# Patient Record
Sex: Male | Born: 1955 | Race: White | Hispanic: No | Marital: Married | Smoking: Former smoker
Health system: Southern US, Community
[De-identification: ages and names within clinical notes are randomized; demographics above are authoritative.]

## PROBLEM LIST (undated history)

## (undated) DIAGNOSIS — K429 Umbilical hernia without obstruction or gangrene: Secondary | ICD-10-CM

## (undated) DIAGNOSIS — E785 Hyperlipidemia, unspecified: Secondary | ICD-10-CM

## (undated) DIAGNOSIS — M545 Low back pain, unspecified: Secondary | ICD-10-CM

## (undated) DIAGNOSIS — R200 Anesthesia of skin: Secondary | ICD-10-CM

## (undated) DIAGNOSIS — I340 Nonrheumatic mitral (valve) insufficiency: Secondary | ICD-10-CM

## (undated) DIAGNOSIS — I219 Acute myocardial infarction, unspecified: Secondary | ICD-10-CM

## (undated) DIAGNOSIS — I10 Essential (primary) hypertension: Secondary | ICD-10-CM

## (undated) DIAGNOSIS — I509 Heart failure, unspecified: Secondary | ICD-10-CM

## (undated) DIAGNOSIS — F419 Anxiety disorder, unspecified: Secondary | ICD-10-CM

## (undated) DIAGNOSIS — R202 Paresthesia of skin: Secondary | ICD-10-CM

## (undated) DIAGNOSIS — I5032 Chronic diastolic (congestive) heart failure: Secondary | ICD-10-CM

## (undated) DIAGNOSIS — G8929 Other chronic pain: Secondary | ICD-10-CM

## (undated) DIAGNOSIS — F329 Major depressive disorder, single episode, unspecified: Secondary | ICD-10-CM

## (undated) DIAGNOSIS — I2510 Atherosclerotic heart disease of native coronary artery without angina pectoris: Secondary | ICD-10-CM

## (undated) DIAGNOSIS — F32A Depression, unspecified: Secondary | ICD-10-CM

## (undated) DIAGNOSIS — K219 Gastro-esophageal reflux disease without esophagitis: Secondary | ICD-10-CM

## (undated) DIAGNOSIS — M1990 Unspecified osteoarthritis, unspecified site: Secondary | ICD-10-CM

## (undated) DIAGNOSIS — E786 Lipoprotein deficiency: Secondary | ICD-10-CM

## (undated) HISTORY — DX: Atherosclerotic heart disease of native coronary artery without angina pectoris: I25.10

## (undated) HISTORY — DX: Gastro-esophageal reflux disease without esophagitis: K21.9

## (undated) HISTORY — DX: Hyperlipidemia, unspecified: E78.5

## (undated) HISTORY — DX: Nonrheumatic mitral (valve) insufficiency: I34.0

## (undated) HISTORY — DX: Chronic diastolic (congestive) heart failure: I50.32

## (undated) HISTORY — DX: Essential (primary) hypertension: I10

## (undated) HISTORY — DX: Lipoprotein deficiency: E78.6

---

## 2001-05-29 ENCOUNTER — Encounter: Admission: RE | Admit: 2001-05-29 | Discharge: 2001-05-29 | Payer: Self-pay | Admitting: Internal Medicine

## 2001-05-29 ENCOUNTER — Encounter: Payer: Self-pay | Admitting: Internal Medicine

## 2002-11-19 DIAGNOSIS — I219 Acute myocardial infarction, unspecified: Secondary | ICD-10-CM

## 2002-11-19 HISTORY — DX: Acute myocardial infarction, unspecified: I21.9

## 2002-11-19 HISTORY — PX: CORONARY ARTERY BYPASS GRAFT: SHX141

## 2003-08-01 ENCOUNTER — Inpatient Hospital Stay (HOSPITAL_COMMUNITY): Admission: EM | Admit: 2003-08-01 | Discharge: 2003-08-05 | Payer: Self-pay | Admitting: Emergency Medicine

## 2003-08-01 ENCOUNTER — Encounter: Payer: Self-pay | Admitting: Cardiothoracic Surgery

## 2003-08-01 ENCOUNTER — Encounter: Payer: Self-pay | Admitting: Emergency Medicine

## 2003-08-02 ENCOUNTER — Encounter: Payer: Self-pay | Admitting: Cardiothoracic Surgery

## 2003-08-03 ENCOUNTER — Encounter: Payer: Self-pay | Admitting: Cardiothoracic Surgery

## 2003-08-04 ENCOUNTER — Encounter: Payer: Self-pay | Admitting: Cardiothoracic Surgery

## 2003-08-26 ENCOUNTER — Encounter: Payer: Self-pay | Admitting: Cardiothoracic Surgery

## 2003-08-26 ENCOUNTER — Encounter: Admission: RE | Admit: 2003-08-26 | Discharge: 2003-08-26 | Payer: Self-pay | Admitting: Cardiothoracic Surgery

## 2003-09-17 ENCOUNTER — Inpatient Hospital Stay (HOSPITAL_COMMUNITY): Admission: EM | Admit: 2003-09-17 | Discharge: 2003-09-22 | Payer: Self-pay | Admitting: Emergency Medicine

## 2003-12-23 ENCOUNTER — Encounter: Admission: RE | Admit: 2003-12-23 | Discharge: 2003-12-23 | Payer: Self-pay | Admitting: Cardiothoracic Surgery

## 2003-12-23 IMAGING — CR DG CHEST 2V
2 series · 2 of 2 positions shown · non-contrast
Comparison: none

CLINICAL DATA: History of CABG, CAD, follow up exam. 
 CHEST, TWO VIEWS
 Previous chest x-ray was at[HOSPITAL]on [DATE].  Changes of COPD are noted.  Sternal wire sutures and mediastinal clips.  No vascular congestion nor acute lung process.  Normal heart size and mediastinal contours. 
 IMPRESSION
 No acute chest disease findings.  CABG.  COPD.

[view not recorded (1 of 2)]
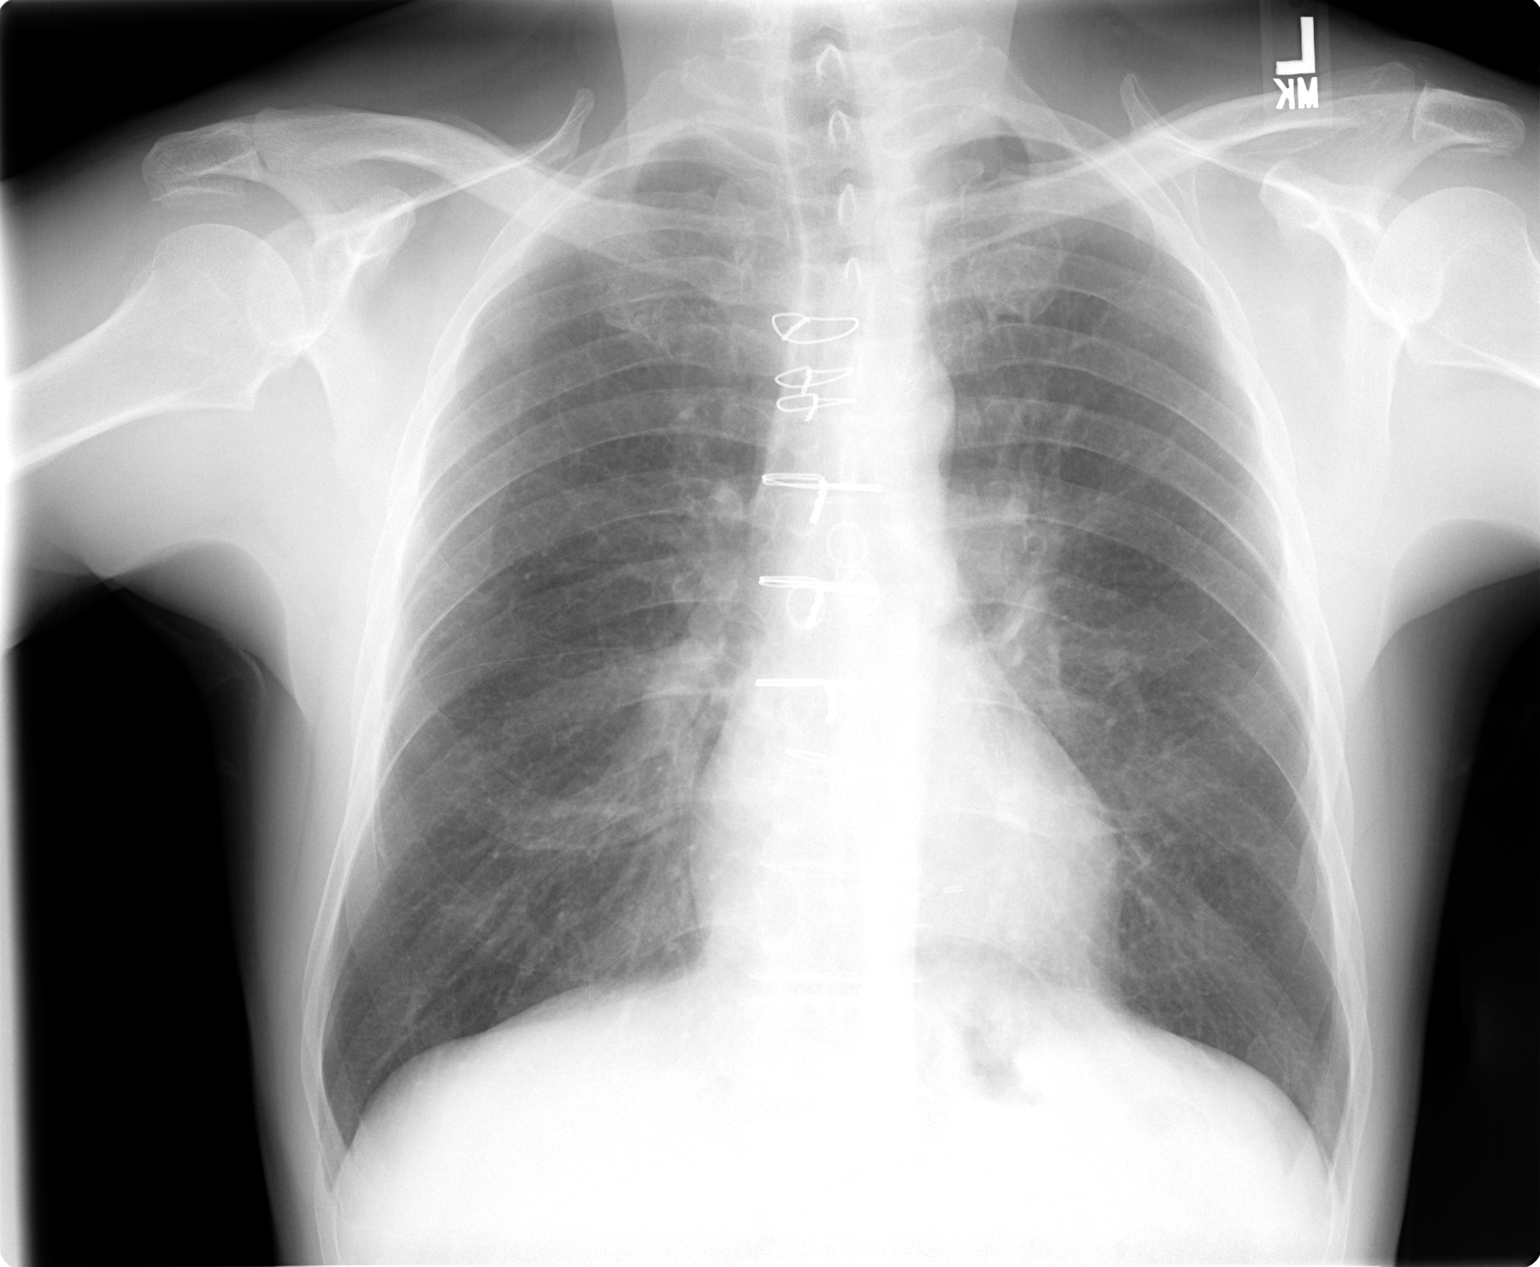

[view not recorded (2 of 2)]
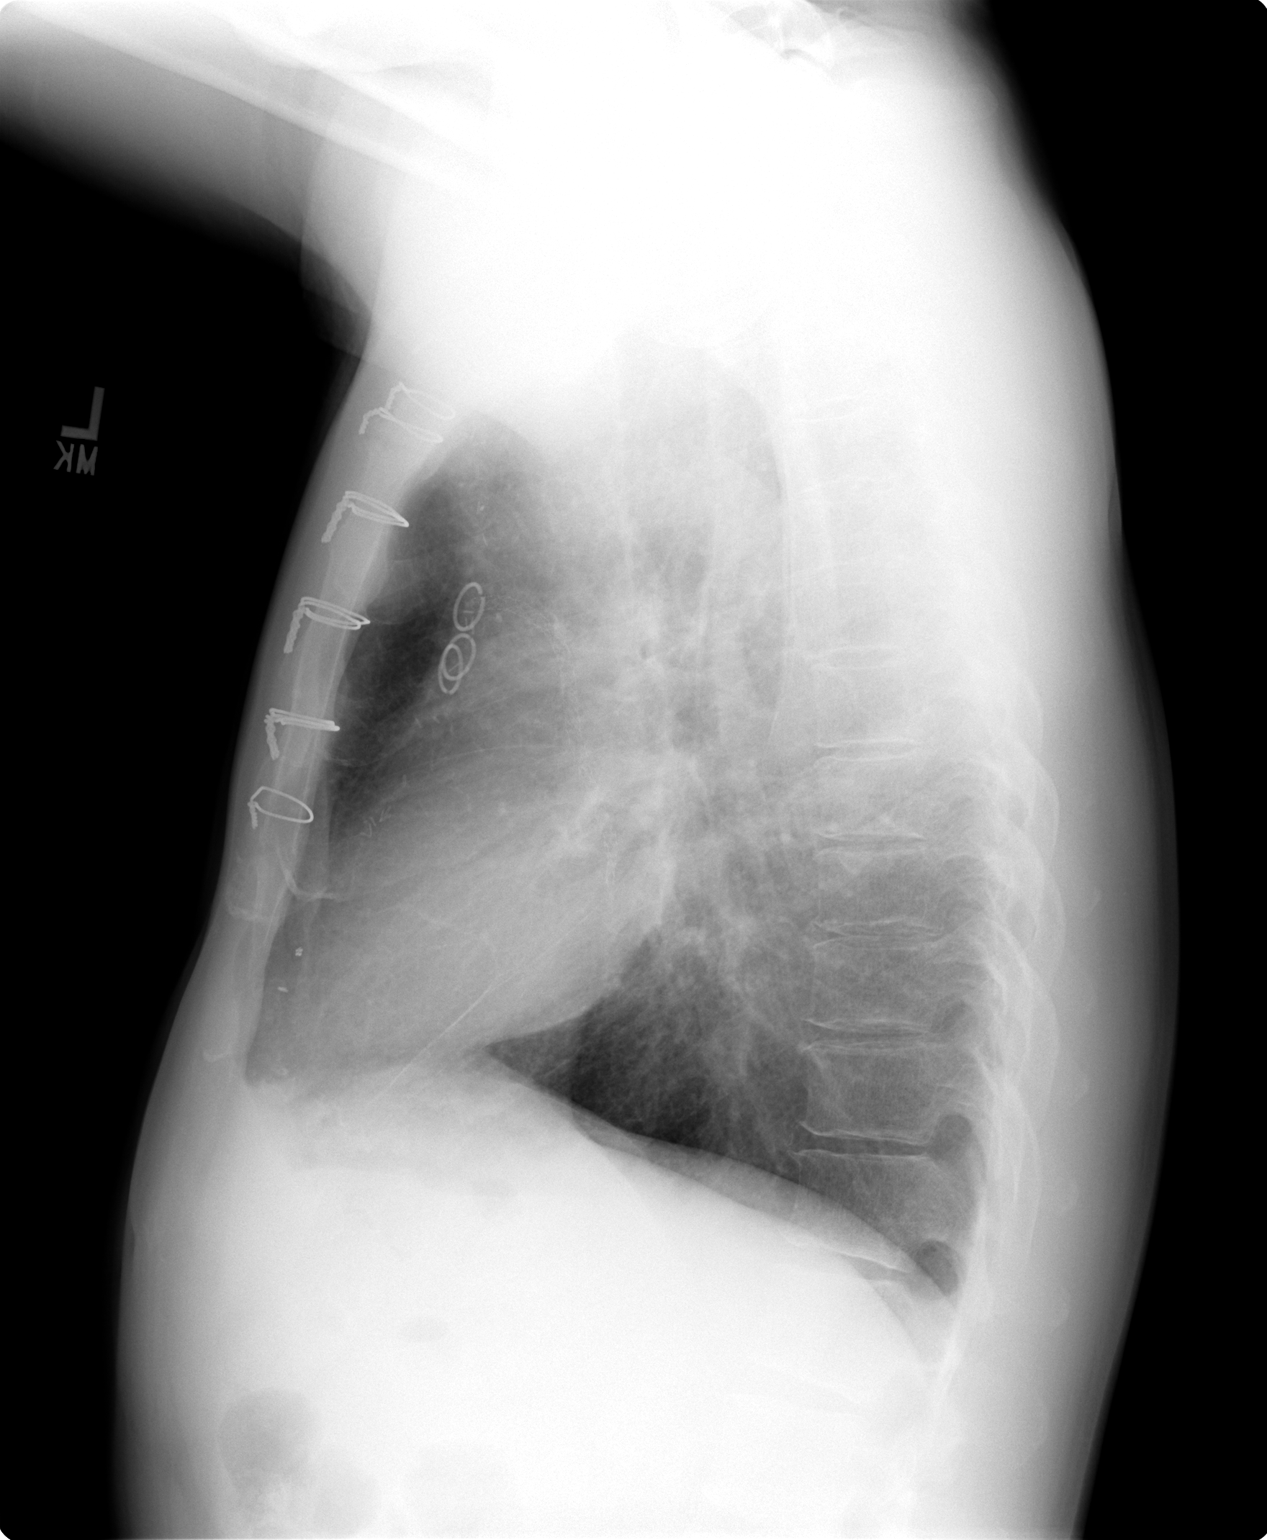

[2 of 2 positions shown; findings below may reference images not displayed]

## 2008-11-19 HISTORY — PX: INCISION AND DRAINAGE OF WOUND: SHX1803

## 2009-10-29 ENCOUNTER — Ambulatory Visit: Payer: Self-pay | Admitting: Internal Medicine

## 2009-10-29 ENCOUNTER — Inpatient Hospital Stay (HOSPITAL_COMMUNITY): Admission: EM | Admit: 2009-10-29 | Discharge: 2009-11-01 | Payer: Self-pay | Admitting: Emergency Medicine

## 2009-10-29 IMAGING — CR DG FEMUR 2V*L*
4 series · 4 of 4 positions shown · non-contrast
Comparison: None

CLINICAL DATA: Left leg swelling.  Possible infection.

LEFT FEMUR - 2 VIEW

[t femur with hip  ap left]
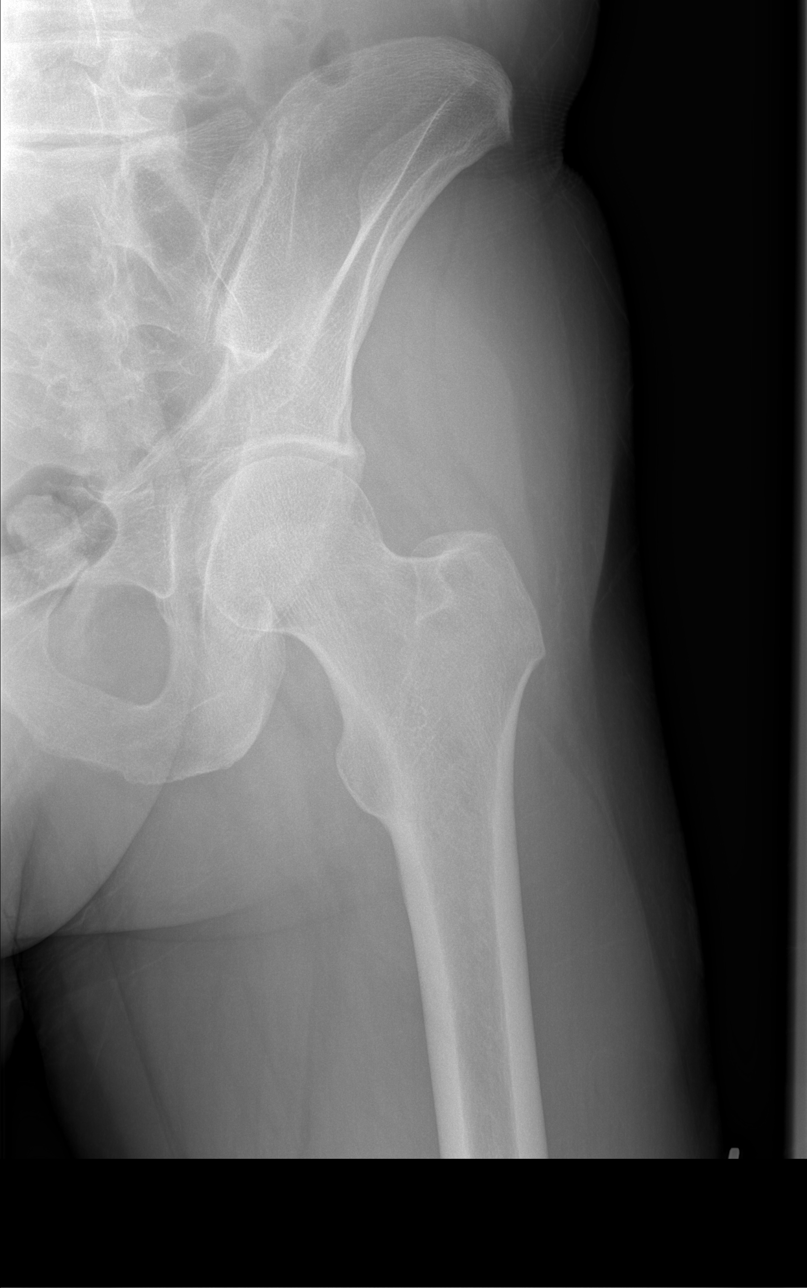

[t femur with knee ap left]
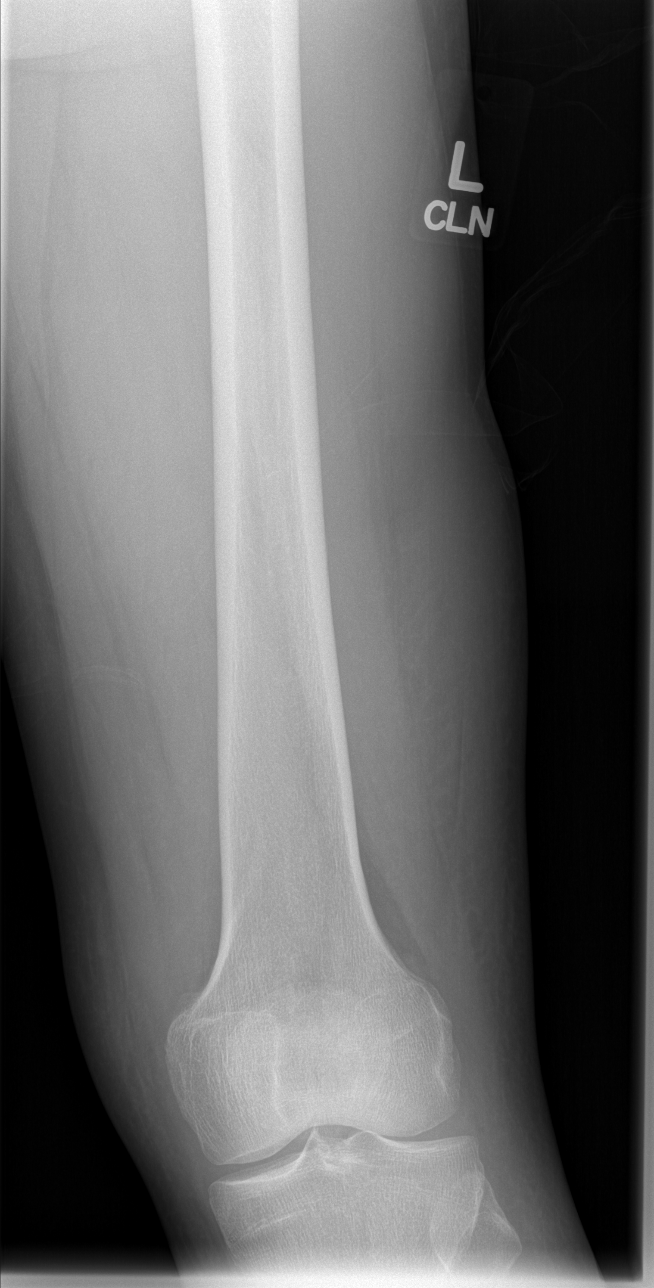

[t femur with hip lat left]
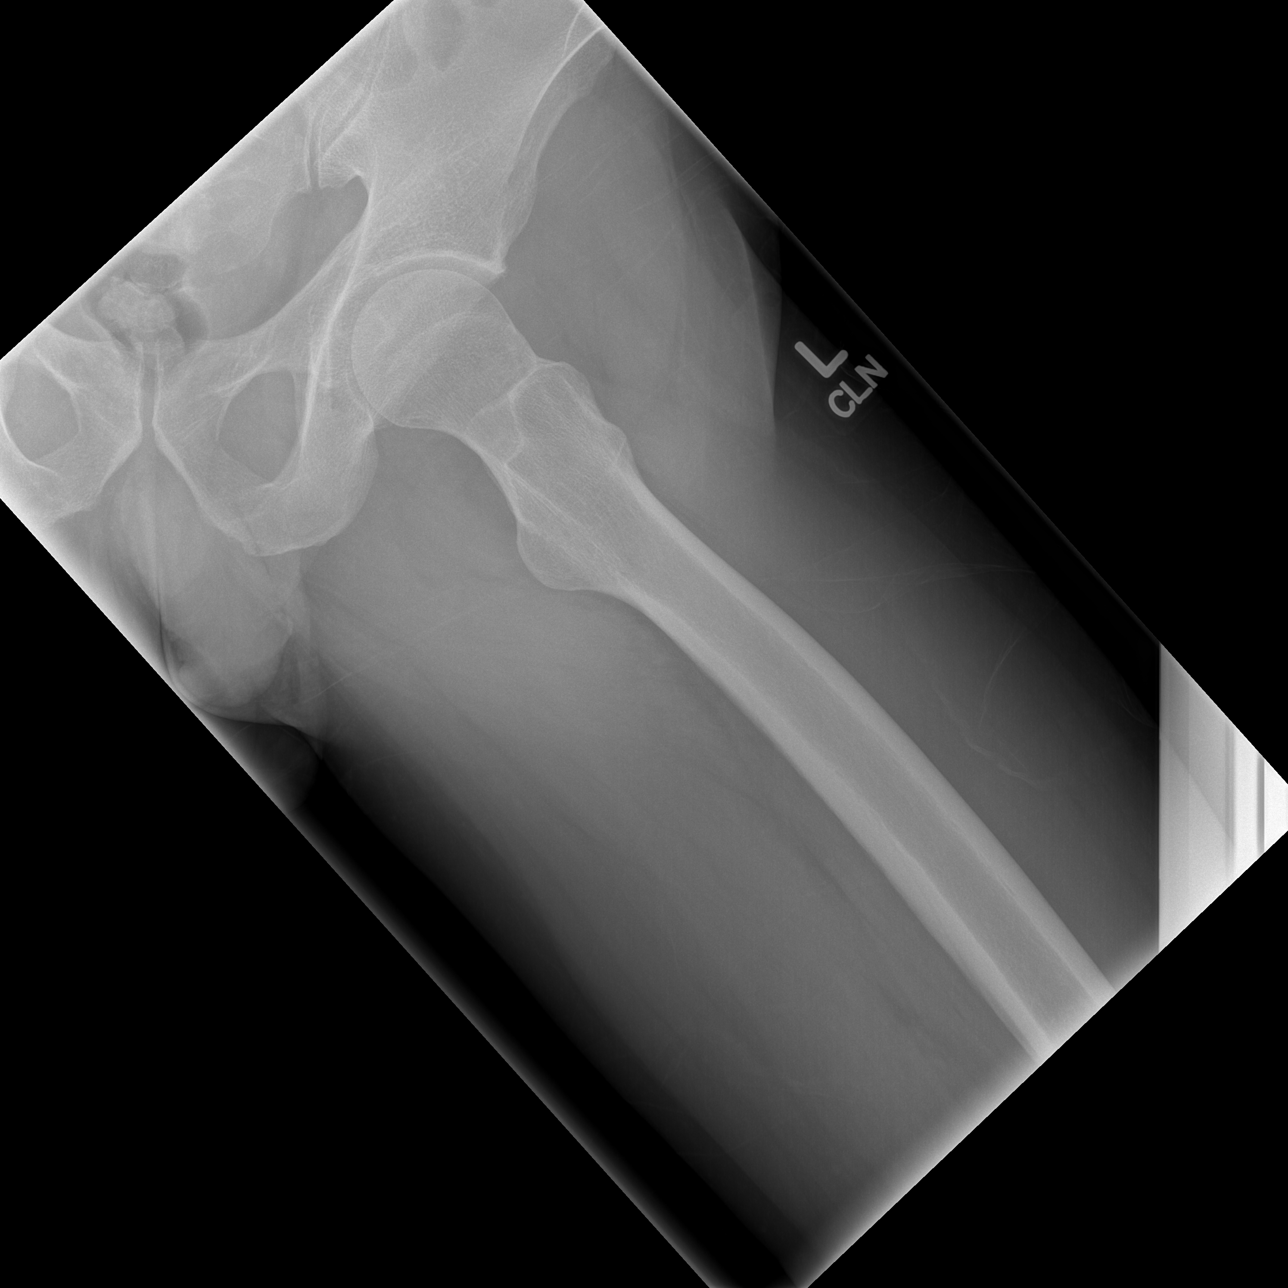

[t femur with knee lat left]
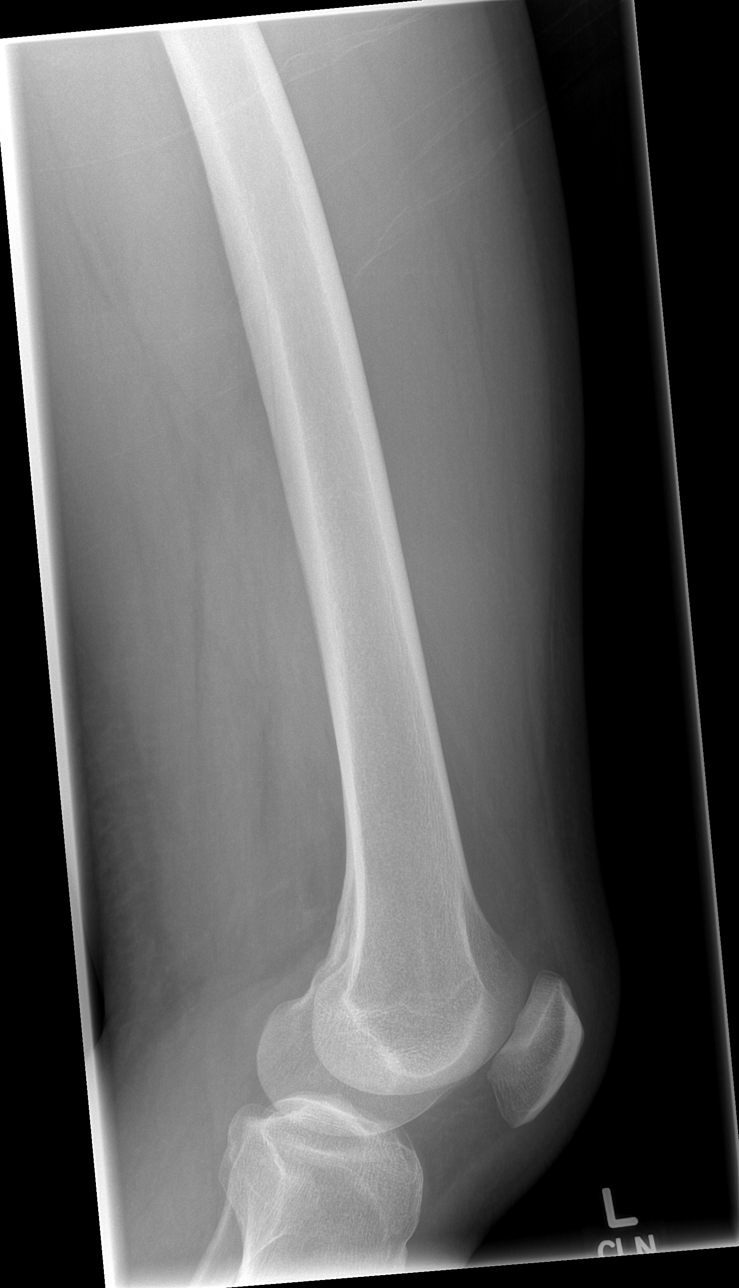

[4 of 4 positions shown; findings below may reference images not displayed]

FINDINGS: There appears to be some soft tissue swelling distally in
the lower thigh.  No foreign body, soft tissue emphysema or bone
destruction is identified.  There is no evidence of acute fracture
or dislocation.
IMPRESSION: No acute osseous findings or evidence of foreign body.  Infection
is not excluded.

## 2009-10-29 IMAGING — CR DG TIBIA/FIBULA 2V*L*
4 series · 4 of 4 positions shown · non-contrast
Comparison: None

CLINICAL DATA: Lower leg pain, swelling and erythema.  History of
puncture wound.

LEFT TIBIA AND FIBULA - 2 VIEW

[t tib/fib ap left (1 of 2)]
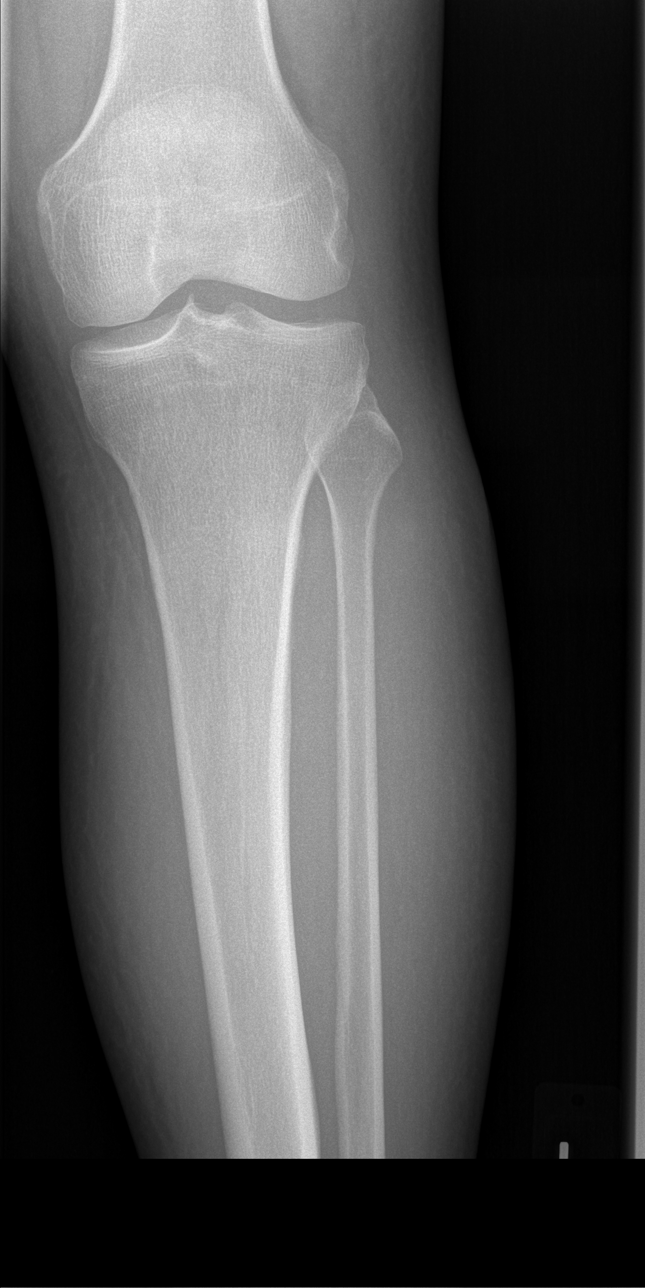

[t tib/fib ap left (2 of 2)]
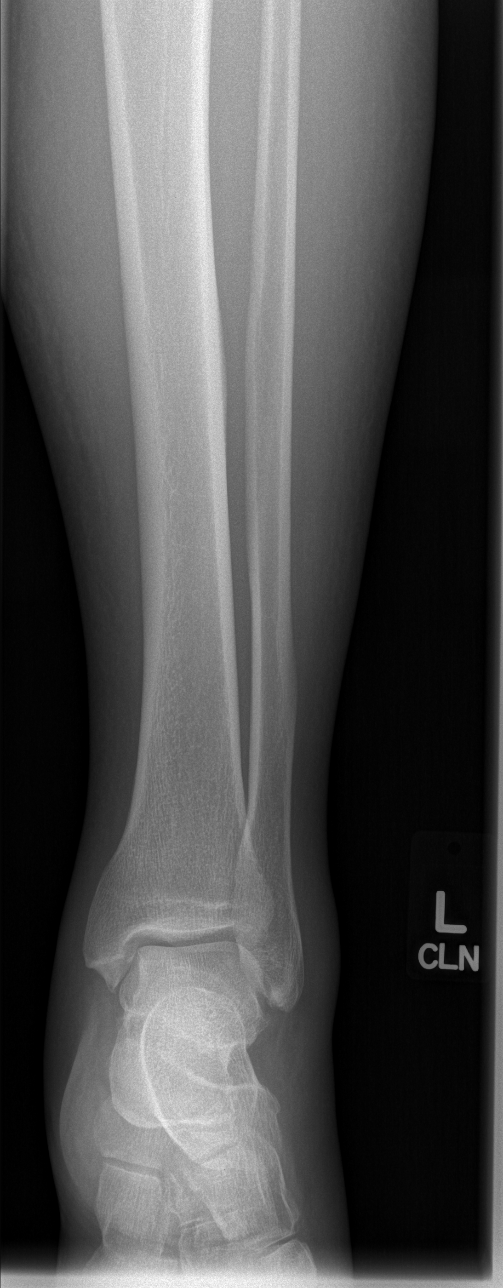

[t tib/fib lat left (1 of 2)]
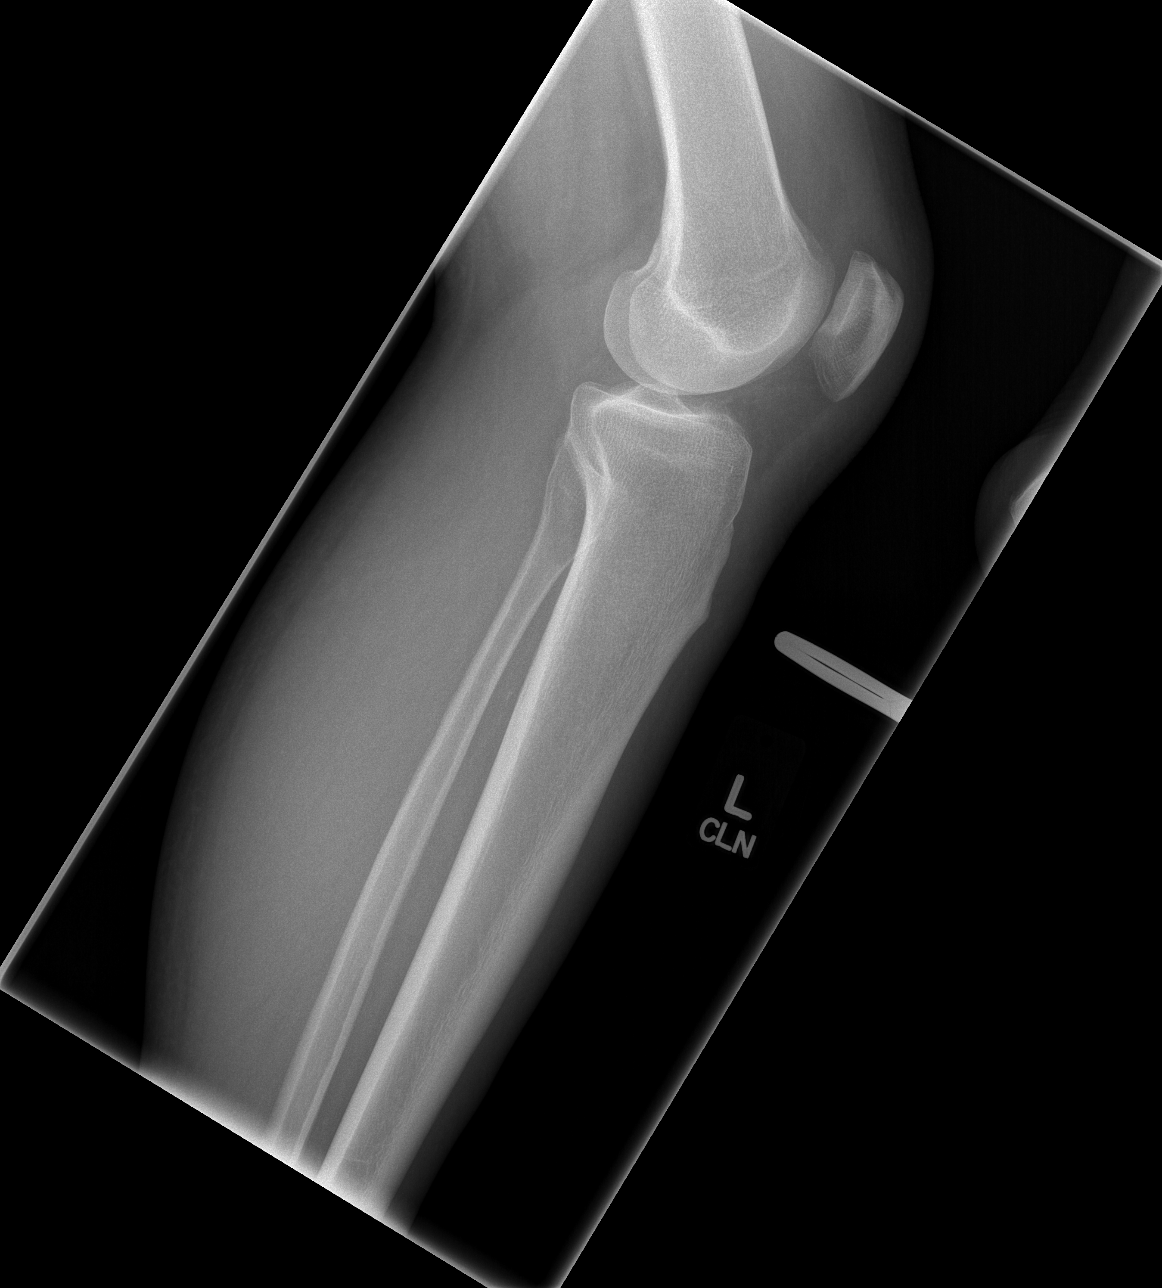

[t tib/fib lat left (2 of 2)]
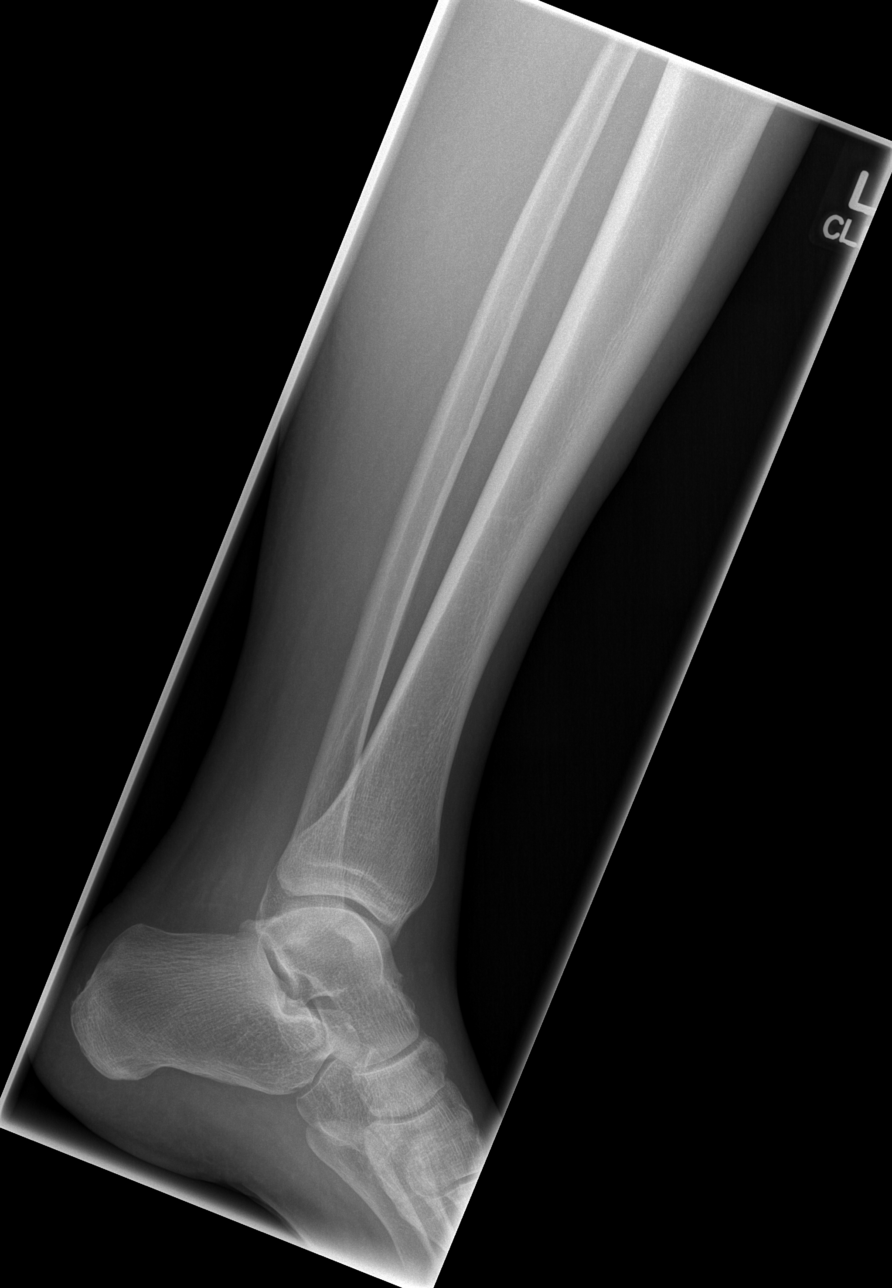

[4 of 4 positions shown; findings below may reference images not displayed]

FINDINGS: Puncture wound is marked with a hemostat.  There is no
evidence of acute fracture, dislocation or bone destruction.  No
foreign body or soft tissue emphysema is identified.  There appears
to be some soft tissue swelling proximally in the lower leg.
IMPRESSION: No acute osseous findings or evidence of foreign body.  Infection
is not excluded.

## 2009-11-07 ENCOUNTER — Ambulatory Visit: Payer: Self-pay | Admitting: Internal Medicine

## 2009-11-07 DIAGNOSIS — E786 Lipoprotein deficiency: Secondary | ICD-10-CM | POA: Insufficient documentation

## 2009-11-07 DIAGNOSIS — I509 Heart failure, unspecified: Secondary | ICD-10-CM | POA: Insufficient documentation

## 2009-11-07 DIAGNOSIS — L02419 Cutaneous abscess of limb, unspecified: Secondary | ICD-10-CM | POA: Insufficient documentation

## 2009-11-07 DIAGNOSIS — I10 Essential (primary) hypertension: Secondary | ICD-10-CM | POA: Insufficient documentation

## 2009-11-07 DIAGNOSIS — K219 Gastro-esophageal reflux disease without esophagitis: Secondary | ICD-10-CM | POA: Insufficient documentation

## 2009-11-08 ENCOUNTER — Encounter: Payer: Self-pay | Admitting: Internal Medicine

## 2009-11-09 ENCOUNTER — Telehealth: Payer: Self-pay | Admitting: Internal Medicine

## 2009-11-28 ENCOUNTER — Ambulatory Visit: Payer: Self-pay | Admitting: Internal Medicine

## 2009-11-28 LAB — CONVERTED CEMR LAB

## 2010-01-27 ENCOUNTER — Telehealth: Payer: Self-pay | Admitting: *Deleted

## 2010-02-16 ENCOUNTER — Encounter: Payer: Self-pay | Admitting: Internal Medicine

## 2010-03-19 HISTORY — PX: CORONARY ANGIOPLASTY WITH STENT PLACEMENT: SHX49

## 2010-04-06 ENCOUNTER — Inpatient Hospital Stay (HOSPITAL_COMMUNITY): Admission: EM | Admit: 2010-04-06 | Discharge: 2010-04-07 | Payer: Self-pay | Admitting: Emergency Medicine

## 2010-04-06 ENCOUNTER — Ambulatory Visit: Payer: Self-pay | Admitting: Internal Medicine

## 2010-04-06 ENCOUNTER — Emergency Department (HOSPITAL_COMMUNITY): Admission: EM | Admit: 2010-04-06 | Discharge: 2010-04-06 | Payer: Self-pay | Admitting: Emergency Medicine

## 2010-04-06 ENCOUNTER — Encounter: Payer: Self-pay | Admitting: Internal Medicine

## 2010-04-06 IMAGING — CR DG CHEST 2V
2 series · 2 of 2 positions shown · non-contrast
Comparison: [DATE].

CLINICAL DATA: Short of breath.

CHEST - 2 VIEW

[view not recorded (1 of 2)]
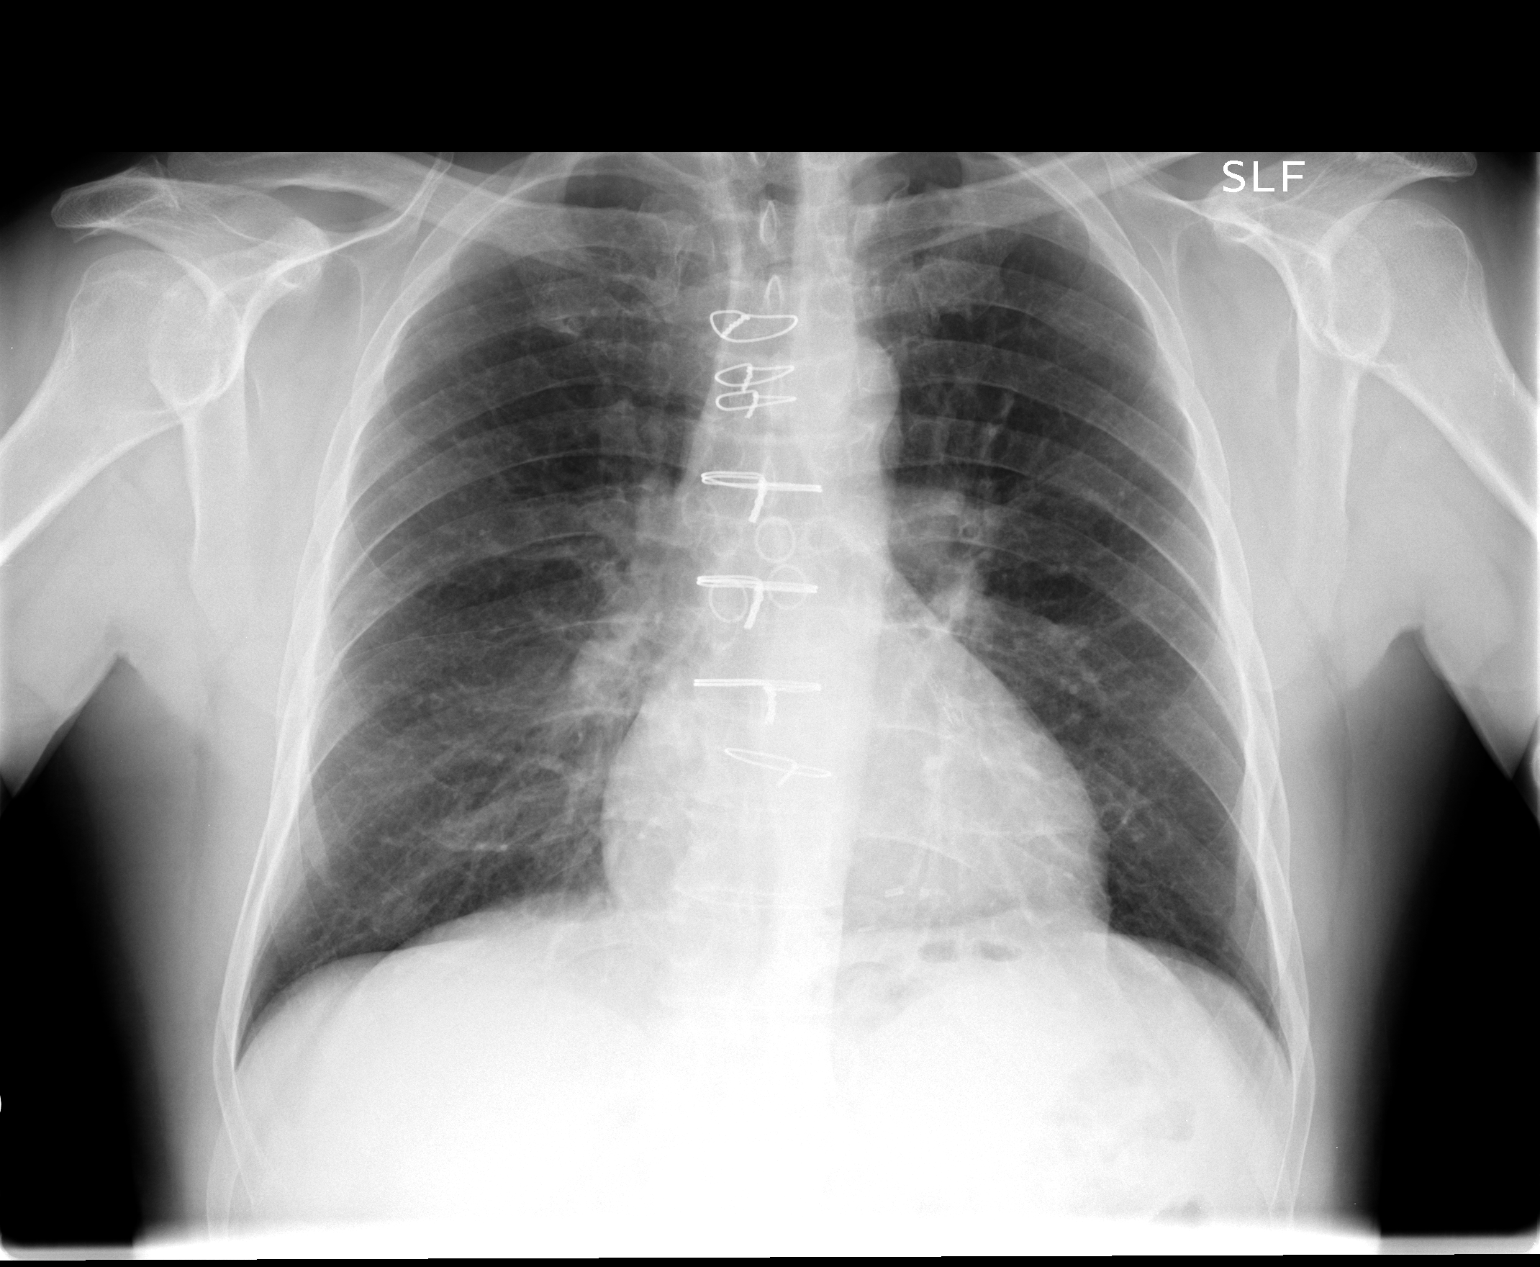

[view not recorded (2 of 2)]
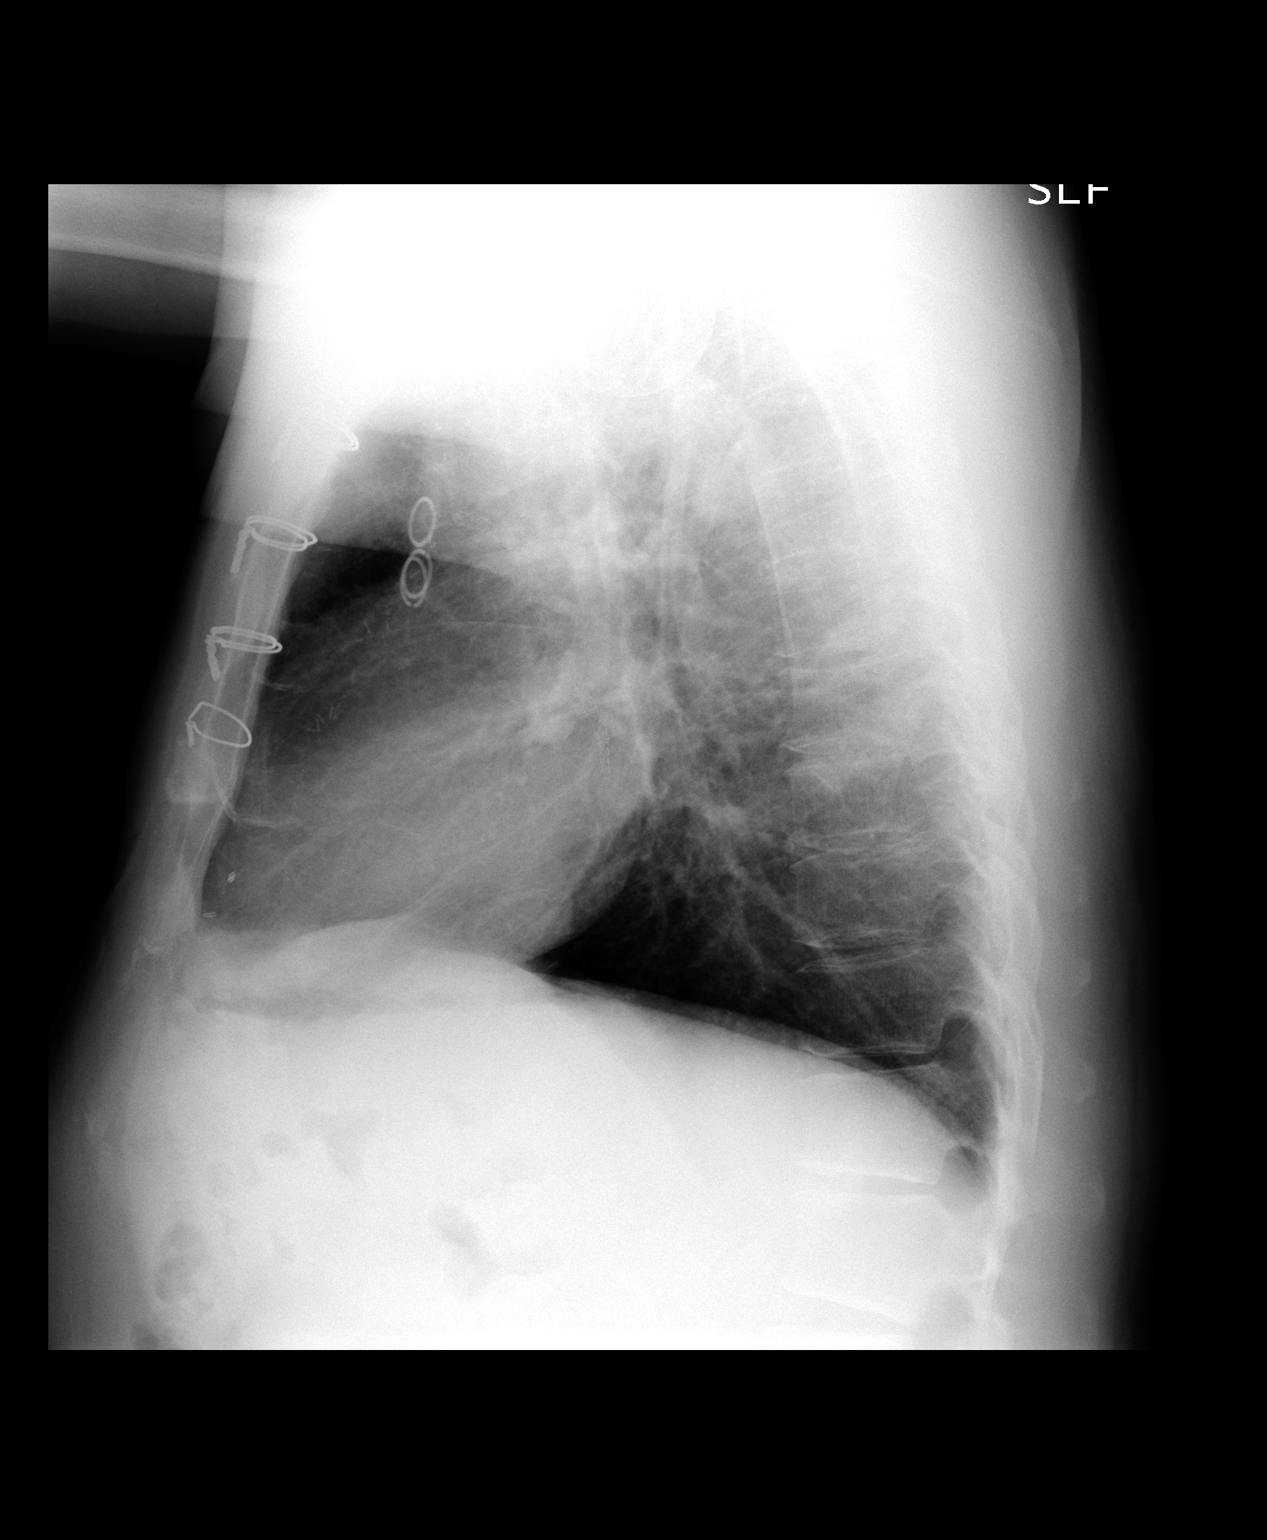

[2 of 2 positions shown; findings below may reference images not displayed]

FINDINGS: Postop CABG.  Heart size is upper normal.  There is no
heart failure.  There is COPD with hyperinflation and scarring.  No
acute infiltrate or effusion.  Negative for mass lesion.
IMPRESSION: COPD.

No acute cardiopulmonary disease.

## 2010-04-07 ENCOUNTER — Encounter: Payer: Self-pay | Admitting: Internal Medicine

## 2010-04-07 DIAGNOSIS — I25810 Atherosclerosis of coronary artery bypass graft(s) without angina pectoris: Secondary | ICD-10-CM | POA: Insufficient documentation

## 2010-04-10 ENCOUNTER — Telehealth: Payer: Self-pay | Admitting: Internal Medicine

## 2010-04-11 ENCOUNTER — Telehealth: Payer: Self-pay | Admitting: Internal Medicine

## 2010-04-19 ENCOUNTER — Encounter: Payer: Self-pay | Admitting: Internal Medicine

## 2010-04-20 ENCOUNTER — Ambulatory Visit: Payer: Self-pay | Admitting: Cardiovascular Disease

## 2010-04-20 ENCOUNTER — Inpatient Hospital Stay (HOSPITAL_COMMUNITY): Admission: RE | Admit: 2010-04-20 | Discharge: 2010-04-21 | Payer: Self-pay | Admitting: Cardiovascular Disease

## 2010-04-21 ENCOUNTER — Telehealth: Payer: Self-pay | Admitting: Internal Medicine

## 2010-04-24 ENCOUNTER — Telehealth: Payer: Self-pay | Admitting: Internal Medicine

## 2010-05-02 ENCOUNTER — Telehealth: Payer: Self-pay | Admitting: Internal Medicine

## 2010-05-15 ENCOUNTER — Telehealth: Payer: Self-pay | Admitting: Internal Medicine

## 2010-05-23 ENCOUNTER — Telehealth (INDEPENDENT_AMBULATORY_CARE_PROVIDER_SITE_OTHER): Payer: Self-pay | Admitting: *Deleted

## 2010-05-25 ENCOUNTER — Telehealth: Payer: Self-pay | Admitting: Internal Medicine

## 2010-06-12 DIAGNOSIS — I2589 Other forms of chronic ischemic heart disease: Secondary | ICD-10-CM | POA: Insufficient documentation

## 2010-06-14 ENCOUNTER — Ambulatory Visit: Payer: Self-pay | Admitting: Internal Medicine

## 2010-06-14 DIAGNOSIS — I340 Nonrheumatic mitral (valve) insufficiency: Secondary | ICD-10-CM | POA: Insufficient documentation

## 2010-08-14 ENCOUNTER — Telehealth: Payer: Self-pay | Admitting: Internal Medicine

## 2010-11-24 ENCOUNTER — Telehealth: Payer: Self-pay | Admitting: Internal Medicine

## 2010-12-09 ENCOUNTER — Encounter: Payer: Self-pay | Admitting: Cardiothoracic Surgery

## 2010-12-19 NOTE — Progress Notes (Signed)
Summary: plavix  Phone Note Outgoing Call   Call placed by: Meredith Staggers, RN,  May 25, 2010 11:24 AM Summary of Call: pts Plavix arrived from company, pts mother-in-law Mrs Susette Racer is aware and she and pts wife will p/u meds today

## 2010-12-19 NOTE — Letter (Signed)
Summary: Cardiac Catheterization Instructions- Main Lab  [facility], Main Office  [address]   [place], [place] [address]   Phone: [phone]  Fax: [phone]     04/19/2010 MRN: [id number]  [facility] [place] [address] [place], [place]  [address]  Dear Frank [name],   You are scheduled for Cardiac Catheterization on Thursday 04/20/10 with Dr. Gala Romney  Please arrive at the [facility] at 8:00am      a.m./p.m. on the day of your procedure.  1. DIET     __X__ Nothing to eat or drink after midnight except your medications with a sip of water.  2. Come to the [place] office on             for lab work.  The lab at [facility] is open from 8:30 a.m. to 1:30 p.m. and 2:30 p.m. to 5:00 p.m.  The lab at [facility] is open from 7:30 a.m. to 5:30 p.m.  You do not have to be fasting.  3. MAKE SURE YOU TAKE YOUR ASPIRIN.  4. _____ DO NOT TAKE these medications before your procedure:         ________________________________________________________________________________      _X___ YOU MAY TAKE ALL of your medications with a small amount of water.      ____ START NEW medications:     ________________________________________________________________________________      ____ Eilene Ghazi instructions:     ________________________________________________________________________________  5. Plan for one night stay - bring personal belongings (i.e. toothpaste, toothbrush, etc.)  6. Bring a current list of your medications and current insurance cards.  7. Must have a responsible person to drive you home.   8. Someone must be with yu for the first 24 hours after you arrive home.  9. Please wear clothes that are easy to get on and off and wear slip-on shoes.  *Special note: Every effort is made to have your procedure done on time.  Occasionally there are emergencies that present themselves at the hospital that may cause delays.   Please be patient if a delay does occur.  If you have any questions after you get home, please call the office at the number listed above.  Meredith Staggers, RN  Appended Document: Cardiac Catheterization Instructions- Main Lab instructions given to pts mother-in-law

## 2010-12-19 NOTE — Progress Notes (Signed)
Summary: plavix  Phone Note Outgoing Call   Call placed by: Meredith Staggers, RN,  August 14, 2010 3:30 PM Call placed to: MS Gypsy Decant, mother-in-law Summary of Call: pts Plavix arrived from [place], called and left mess on vm to pick up

## 2010-12-19 NOTE — Discharge Summary (Signed)
Summary: Hospital Discharge Update    Hospital Discharge Update:  Date of Admission: 04/06/2010 Date of Discharge: 04/08/2010  Brief Summary:  CAD, non complance with plavix for financial reasons. Trasferred care from [place] to [facility]. s/p cath this admission by [place], revelaing stenosis of RCA and SVG occlussion.  Plan for PTCA with stenting of RCA and SVG on 04/20/2010. Assistance for plavix at [facility] cardiology office.  Lab or other results pending at discharge:  none  Other follow-up issues:  Medication compliance and availability to get his plavix through pharmaceutical company at [employer] office. Followup patient DOE.   Problem list changes:  Added new problem of CAD (ICD-414.00) - Signed  Medication list changes:  Changed medication from ASPIR-TRIN 325 MG TBEC (ASPIRIN) Take 1 tablet by mouth daily. to ASPIR-LOW 81 MG TBEC (ASPIRIN) - Signed Changed medication from PLAVIX 75 MG TABS (CLOPIDOGREL BISULFATE) Take 1 tablet by mouth daily. to PLAVIX 75 MG TABS (CLOPIDOGREL BISULFATE) Take 1 tablet by mouth daily. - Signed Removed medication of OXYCODONE-ACETAMINOPHEN 5-325 MG TABS (OXYCODONE-ACETAMINOPHEN) Take 1 tablet by mouth every 6 hour for pain as needed. Rx of METOPROLOL SUCCINATE 25 MG XR24H-TAB (METOPROLOL SUCCINATE) Take 1 tablet by mouth daily.;  #90 x 0;  Signed;  Entered by: Blanch Media MD;  Authorized by: Blanch Media MD;  Method used: Print then Give to Patient Rx of LISINOPRIL 2.5 MG TABS (LISINOPRIL) Take 1 tablet by mouth daily.;  #90 x 0;  Signed;  Entered by: Blanch Media MD;  Authorized by: Blanch Media MD;  Method used: Print then Give to Patient Rx of LIPITOR 20 MG TABS (ATORVASTATIN CALCIUM) Take 1 tablet by mouth  daily.;  #90 x 0;  Signed;  Entered by: Blanch Media MD;  Authorized by: Blanch Media MD;  Method used: Print then Give to Patient Rx of PRILOSEC 20 MG CPDR (OMEPRAZOLE) Take 1 tablet by mouth daily.;  #90  x 0;  Signed;  Entered by: Blanch Media MD;  Authorized by: Blanch Media MD;  Method used: Print then Give to Patient Rx of PLAVIX 75 MG TABS (CLOPIDOGREL BISULFATE) Take 1 tablet by mouth daily.;  #7 x 0;  Signed;  Entered by: Blanch Media MD;  Authorized by: Blanch Media MD;  Method used: Print then Give to Patient Rx of PLAVIX 75 MG TABS (CLOPIDOGREL BISULFATE) Take 1 tablet by mouth daily.;  #14 x 0;  Signed;  Entered by: Blanch Media MD;  Authorized by: Blanch Media MD;  Method used: Print then Give to Patient  The medication, problem, and allergy lists have been updated.  Please see the dictated discharge summary for details.  Discharge medications:  PLAVIX 75 MG TABS (CLOPIDOGREL BISULFATE) Take 1 tablet by mouth daily. METOPROLOL SUCCINATE 25 MG XR24H-TAB (METOPROLOL SUCCINATE) Take 1 tablet by mouth daily. LISINOPRIL 2.5 MG TABS (LISINOPRIL) Take 1 tablet by mouth daily. ASPIR-LOW 81 MG TBEC (ASPIRIN)  LIPITOR 20 MG TABS (ATORVASTATIN CALCIUM) Take 1 tablet by mouth  daily. CALCIUM 500 MG TABS (CALCIUM CARBONATE) Take 1 tablet once a day. PRILOSEC 20 MG CPDR (OMEPRAZOLE) Take 1 tablet by mouth daily.  Other patient instructions:  You next appointment with Cardiologist is on:  Your next appointment with [facility] is on 04/27/2010 at 2:30 pm    Note: Hospital Discharge Medications & Other Instructions handout was printed, one copy for patient and a second copy to be placed in hospital chart.  Prescriptions: PLAVIX 75 MG TABS (CLOPIDOGREL BISULFATE) Take 1  tablet by mouth daily.  #14 x 0   Entered and Authorized by:   Blanch Media MD   Signed by:   Blanch Media MD on 04/07/2010   Method used:   Print then Give to Patient   RxID:   [id number] PLAVIX 75 MG TABS (CLOPIDOGREL BISULFATE) Take 1 tablet by mouth daily.  #7 x 0   Entered and Authorized by:   Blanch Media MD   Signed by:   Blanch Media MD on  04/07/2010   Method used:   Print then Give to Patient   RxID:   [id number] PRILOSEC 20 MG CPDR (OMEPRAZOLE) Take 1 tablet by mouth daily.  #90 x 0   Entered and Authorized by:   Blanch Media MD   Signed by:   Blanch Media MD on 04/07/2010   Method used:   Print then Give to Patient   RxID:   [id number] LIPITOR 20 MG TABS (ATORVASTATIN CALCIUM) Take 1 tablet by mouth  daily.  #90 x 0   Entered and Authorized by:   Blanch Media MD   Signed by:   Blanch Media MD on 04/07/2010   Method used:   Print then Give to Patient   RxID:   [id number] LISINOPRIL 2.5 MG TABS (LISINOPRIL) Take 1 tablet by mouth daily.  #90 x 0   Entered and Authorized by:   Blanch Media MD   Signed by:   Blanch Media MD on 04/07/2010   Method used:   Print then Give to Patient   RxID:   [id number] METOPROLOL SUCCINATE 25 MG XR24H-TAB (METOPROLOL SUCCINATE) Take 1 tablet by mouth daily.  #90 x 0   Entered and Authorized by:   Blanch Media MD   Signed by:   Blanch Media MD on 04/07/2010   Method used:   Print then Give to Patient   RxID:   [id number]

## 2010-12-19 NOTE — Progress Notes (Signed)
  Phone Note Other Incoming   Summary of Call: Pt's mother in law, Gypsy Decant, in office today asking about Plavix from [facility].  Plavix has not come in yet. Samples of plavix given. Initial call taken by: Dossie Arbour, RN, BSN,  May 23, 2010 10:56 AM    Prescriptions: PLAVIX 75 MG TABS (CLOPIDOGREL BISULFATE) Take 1 tablet by mouth daily.  #4 x 0   Entered by:   Dossie Arbour, RN, BSN   Authorized by:   Dolores Patty, MD, [facility]   Signed by:   Dossie Arbour, RN, BSN on 05/23/2010   Method used:   Samples Given   RxID:   [id number]

## 2010-12-19 NOTE — Progress Notes (Signed)
Summary: Questions about procedure  Phone Note Call from Patient Call back at [phone]   Caller: mother-in -law/ Kendal Hymen Summary of Call: Pt mother -in- law calling for pt regarding his procedure  Initial call taken by: Judie Grieve,  Apr 10, 2010 9:22 AM  Follow-up for Phone Call        I called the cath lab. The pt is scheduled for 04/20/10 at 10:00am. I have called and spoken with the pt's mother-in-law. She already knew the procedure description and date. Dr. Gala Romney had instructed them to call today to find out what time he needed to be at the hospital. I instructed her that the pt should be at the [facility] Stay [facility] @ 8:00am on 04/20/10. Follow-up by: Sherri Rad, RN, BSN,  Apr 10, 2010 9:38 AM

## 2010-12-19 NOTE — Progress Notes (Signed)
Summary: Can pt go back to work / note  Phone Note Call from Patient Call back at [phone]   Caller: Spouse Reason for Call: Talk to Nurse, Talk to Doctor Summary of Call: pt wants to know if he can go back to work on Monday Initial call taken by: Omer Jack,  April 21, 2010 8:27 AM  Follow-up for Phone Call        per pt mother -in-law pt will need a note to return to work on monday. 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  Pts wife aware of our recommendations Meredith Staggers, RN  April 21, 2010 1:37 PM

## 2010-12-19 NOTE — Progress Notes (Signed)
Summary: refill/gg  Phone Note Refill Request  on Apr 11, 2010 10:29 AM  **PLEASE CONSIDER CHANGING LIPITOR TO SIMVASTATIN AND METOPROLOL SUCCINATE TO METOPROLOL TARTRATE SO THE PT CAN AFFORD THESE MEDS.    Method Requested: Electronic Initial call taken by: Merrie Roof RN,  Apr 11, 2010 10:29 AM  Follow-up for Phone Call        changes made. Thanks Follow-up by: Acey Lav MD,  Apr 11, 2010 11:16 AM    New/Updated Medications: METOPROLOL TARTRATE 25 MG TABS (METOPROLOL TARTRATE) take one half tablet two times a day ZOCOR 40 MG TABS (SIMVASTATIN) Take 1 tablet by mouth once a day Prescriptions: ZOCOR 40 MG TABS (SIMVASTATIN) Take 1 tablet by mouth once a day  #30 x 11   Entered by:   Acey Lav MD   Authorized by:   Darnelle Maffucci MD   Signed by:   Paulette Blanch Dam MD on 04/11/2010   Method used:   Electronically to        [facility] (retail)       [address]       [place], [place]  [address]       Ph: [phone]       Fax: [phone]   RxID:   [id number] METOPROLOL TARTRATE 25 MG TABS (METOPROLOL TARTRATE) take one half tablet two times a day  #60 x 11   Entered by:   Acey Lav MD   Authorized by:   Darnelle Maffucci MD   Signed by:   Paulette Blanch Dam MD on 04/11/2010   Method used:   Electronically to        [facility] (retail)       [address]       [place], [place]  [address]       Ph: [phone]       Fax: [phone]   RxID:   [id number]

## 2010-12-19 NOTE — Progress Notes (Signed)
Summary: wants to talk about appt  Phone Note Call from Patient Call back at [phone]   Caller: Spouse Reason for Call: Talk to Nurse, Talk to Doctor Summary of Call: Needs to talk to you about her husbands appt because his work will not let him off  Initial call taken by: Omer Jack,  April 24, 2010 1:33 PM  Follow-up for Phone Call        resch pts appt to 7/27 w/Dr Bensimhon, she is aware to check on pts groin since he will not come in any sooner, she also states that she had filled out forms for plavix assistance and was approved but her pcp can not accept meds so they were suppose to send applciation to [place], have not received she will contact their office Frank Staggers, RN  April 24, 2010 2:00 PM

## 2010-12-19 NOTE — Assessment & Plan Note (Signed)
Summary: eph   Referring Provider:  Dr.  Darrol Angel Primary Provider:  Darnelle Maffucci MD  CC:  eph.  Pt states he is doing good.  History of Present Illness: Frank [name] is a 55 y/o male with h/o HTn, HL and coronary artery disease status post previous bypass surgery in 2004.  He was admitted in May 2011 with progressive dyspnea and occasional chest pain   Underwent cath 04/08/2010 which showed:   1. Three-vessel coronary artery disease as described above. 2. Saphenous vein graft to the obtuse marginal, it is chronically     occluded. 3. Saphenous vein graft to the diagonal is newly occluded since his     last catheterization. 4. Saphenous vein graft sequential graft to the right acute marginal     and posterior descending artery has a long 95% blockage in it.  The     distal limb was occluded.  The left internal mammary artery to the     left anterior descending is patent. 5. Left ventricular ejection fraction is 60% with a mitral valve     prolapse and 2 to 3+ mitral regurgitation. 6. Normal pulmonary pressures with no significant increase in mitral     regurgitation with arm exercise.  Underwent Ion platinum DES placement to RCA x 1 and SVG to acute marginal x2. Also had echo which showed normal LV function with moderate MR.   Returns today for f/u. Doing very well. Denies CP or dyspnea.  Says he is walking 15-20 mins every other day without problem. No HF symptoms. no bleeding. Compliant with all meds. Does not want to go to cardiac rehab.   Current Medications (verified): 1)  Plavix 75 Mg Tabs (Clopidogrel Bisulfate) .... Take 1 Tablet By Mouth Daily. 2)  Lisinopril 2.5 Mg Tabs (Lisinopril) .... Take 1 Tablet By Mouth Daily. 3)  Aspirin 325 Mg Tabs (Aspirin) .... Take One Tablet Once Daily 4)  Calcium 500 Mg Tabs (Calcium Carbonate) .... Take 1 Tablet Once A Day. 5)  Prilosec 20 Mg Cpdr (Omeprazole) .... Take 1 Tablet By Mouth Daily. 6)  Metoprolol Tartrate 25 Mg Tabs (Metoprolol  Tartrate) .... Take One Tablet Two Times A Day 7)  Zocor 40 Mg Tabs (Simvastatin) .... Take 1 Tablet By Mouth Once A Day  Allergies (verified): 1)  ! * Sulfur  Past History:  Past Medical History: 1. CABG 2004.    --cath 5/11: stent to RCA x 1 and SVG-acute marginal x 2. EF normal 2. Moderate MR by echo 5/11 3. Hyperlipidemia 4. GERD 5. Hypertension  Review of Systems       As per HPI and past medical history; otherwise all systems negative.   Vital Signs:  Patient profile:   55 year old male Height:      68 inches Weight:      158 pounds BMI:     24.11 Pulse rate:   59 / minute Pulse rhythm:   regular BP sitting:   124 / 84  (right arm)  Vitals Entered By: Judithe Modest CMA (June 14, 2010 4:33 PM)  Physical Exam  General:  Gen: well appearing. no resp difficulty HEENT: normal Neck: supple. no JVD. Carotids 2+ bilat; no bruits. No lymphadenopathy or thryomegaly appreciated. Cor: PMI nondisplaced. Regular rate & rhythm. No rubs, gallops. 2/6 MR murmur at apex Lungs: clear Abdomen: soft, nontender, nondistended. No hepatosplenomegaly. No bruits or masses. Good bowel sounds. Extremities: no cyanosis, clubbing, rash, edema Neuro: alert & orientedx3, cranial nerves grossly intact. moves  all 4 extremities w/o difficulty. affect pleasant    Impression & Recommendations:  Problem # 1:  CAD (ICD-414.00) Doing well s/p PCI. Needs Plavix for at least 1 year. Refuses cardiac rehab.  Problem # 2:  MITRAL REGURGITATION (ICD-396.3) Moderate by cath and echo. Pulmonary pressures ok. Will continue to follow. Recheck echo 6 months.   Problem # 3:  HYPERLIPIDEMIA (ICD-272.4) Goal LDL < 70. Continue statin. F/u with PCP for lipid and liver panel monitoring.   Other Orders: EKG w/ Interpretation (93000)  Patient Instructions: 1)  Your physician recommends that you schedule a follow-up appointment in: 6 months 2)  Your physician recommends that you continue on your current  medications as directed. Please refer to the Current Medication list given to you today.  Prevention & Chronic Care Immunizations   Influenza vaccine: Not documented   Influenza vaccine deferral: Refused  (11/28/2009)    Tetanus booster: Not documented   Td booster deferral: Deferred  (11/28/2009)    Pneumococcal vaccine: Not documented  Colorectal Screening   Hemoccult: Not documented   Hemoccult action/deferral: Ordered  (11/28/2009)    Colonoscopy: Not documented   Colonoscopy action/deferral: Refused  (11/28/2009)  Other Screening   PSA: Not documented   PSA action/deferral: Discussed-PSA declined  (11/28/2009)   Smoking status: quit  (11/28/2009)  Lipids   Total Cholesterol: Not documented   Lipid panel action/deferral: Deferred   LDL: Not documented   LDL Direct: Not documented   HDL: Not documented   Triglycerides: Not documented    SGOT (AST): Not documented   BMP action: Deferred   SGPT (ALT): Not documented   Alkaline phosphatase: Not documented   Total bilirubin: Not documented  Hypertension   Last Blood Pressure: 124 / 84  (06/14/2010)   Serum creatinine: Not documented   BMP action: Deferred   Serum potassium Not documented  Self-Management Support :   Personal Goals (by the next clinic visit) :      Personal blood pressure goal: 140/90  (11/28/2009)     Personal LDL goal: 100  (11/28/2009)    Hypertension self-management support: Written self-care plan  (11/28/2009)    Lipid self-management support: Not documented

## 2010-12-19 NOTE — Assessment & Plan Note (Signed)
Summary: RA/2 WEEK RECHECK PER [facility]ALADO/[facility]   Vital Signs:  Patient profile:   55 year old male Height:      68 inches (172.72 cm) Weight:      156.6 pounds (71.18 kg) BMI:     23.90 Temp:     97.6 degrees F (36.44 degrees C) oral Pulse rate:   63 / minute BP sitting:   121 / 74  (right arm) Cuff size:   regular  Vitals Entered By: Krystal Eaton Duncan Dull) (November 28, 2009 2:19 PM) CC: 2wk f/u  Is Patient Diabetic? No Pain Assessment Patient in pain? no      Nutritional Status BMI of 25 - 29 = overweight  Have you ever been in a relationship where you felt threatened, hurt or afraid?No   Does patient need assistance? Functional Status Self care Ambulation Normal   Primary Care Provider:  Darnelle Maffucci MD  CC:  2wk f/u .  History of Present Illness: 55 year old recently admitted to [facility] for cellulits 2/2 to insect bite.  Past Medical History:  -CABAG 2004. -Hyperlipidemia LDL -Congestive heart failure EF 40%. -GERD -Hypertension  Presents to [facility] for 2 week f/u. Patient has no new complaints, and is doing well,  Pt denies fever, chills, CP, SOB or any other complaints, and otherwise doing well.   Preventive Screening-Counseling & Management  Alcohol-Tobacco     Smoking Status: quit     Year Quit: 2004  Current Medications (verified): 1)  Plavix 75 Mg Tabs (Clopidogrel Bisulfate) .... Take 1 Tablet By Mouth Daily. 2)  Metoprolol Succinate 25 Mg Xr24h-Tab (Metoprolol Succinate) .... Take 1 Tablet By Mouth Daily. 3)  Oxycodone-Acetaminophen 5-325 Mg Tabs (Oxycodone-Acetaminophen) .... Take 1 Tablet By Mouth Every 6 Hour For Pain As Needed. 4)  Lisinopril 2.5 Mg Tabs (Lisinopril) .... Take 1 Tablet By Mouth Daily. 5)  Aspir-Trin 325 Mg Tbec (Aspirin) .... Take 1 Tablet By Mouth Daily. 6)  Lipitor 20 Mg Tabs (Atorvastatin Calcium) .... Take 1 Tablet By Mouth  Daily. 7)  Calcium 500 Mg Tabs (Calcium Carbonate) .... Take 1 Tablet Once A Day. 8)  Prilosec 20 Mg Cpdr  (Omeprazole) .... Take 1 Tablet By Mouth Daily.  Allergies: 1)  ! * Sulfur  Review of Systems       per hpi  Physical Exam  General:  alert, well-developed, and cooperative to examination.    Neck:  supple, full ROM, no thyromegaly, no JVD, and no carotid bruits.    Lungs:  normal respiratory effort, no accessory muscle use, normal breath sounds, no crackles, and no wheezes.  Heart:  normal rate, regular rhythm, no murmur, no gallop, and no rub.    Abdomen:  soft, non-tender, normal bowel sounds, no distention, no guarding, no rebound tenderness, no hepatomegaly, and no splenomegaly.    Msk:  no joint swelling, no joint warmth, and no redness over joints.    Pulses:  2+ DP/PT pulses bilaterally  Extremities:  No cyanosis, clubbing, edema. Neurologic:  alert & oriented X3, cranial nerves II-XII intact, strength normal in all extremities, sensation intact to light touch.   Impression & Recommendations:  Problem # 1:  CELLULITIS AND ABSCESS OF LEG EXCEPT FOOT (ICD-682.6) patient had cellulitis of his left lower extremity in 10/2009. The swelling, redness and pain has completely healed now. no further doses of antibiotics are indicated, will make another f/u visit in one month.  today will check CBC and CMET to search for any signs of residual infection.  The following medications were removed from the medication list:    Clindamycin Hcl 300 Mg Caps (Clindamycin hcl) .Marland Kitchen... Take 1 tablet every 6 hour.    Doxycycline Hyclate 100 Mg Tabs (Doxycycline hyclate) .Marland Kitchen... Take 1 tablet twice a day.  Orders: T-CBC w/Diff ([id number]) T-Comprehensive Metabolic Panel ([id number])  Problem # 2:  HYPERLIPIDEMIA (ICD-272.4) Last FLP in 10/2009 showed LDL of 45, well controlled, will continue current treatment and recheck FLP and LFT in 3 months.   His updated medication list for this problem includes:    Lipitor 20 Mg Tabs (Atorvastatin calcium) .Marland Kitchen... Take 1 tablet by mouth  daily.  Problem #  3:  HYPERTENSION (ICD-401.9) BP well controlled, cont current treatment regiment.  His updated medication list for this problem includes:    Metoprolol Succinate 25 Mg Xr24h-tab (Metoprolol succinate) .Marland Kitchen... Take 1 tablet by mouth daily.    Lisinopril 2.5 Mg Tabs (Lisinopril) .Marland Kitchen... Take 1 tablet by mouth daily.  BP today: 121/74 Prior BP: 116/73 (11/07/2009)  Problem # 4:  GERD (ICD-530.81) no active symptoms, will continue prilosec.   His updated medication list for this problem includes:    Prilosec 20 Mg Cpdr (Omeprazole) .Marland Kitchen... Take 1 tablet by mouth daily.  Problem # 5:  CONGESTIVE HEART FAILURE (ICD-428.0) Patient has 2 sents and cabg done in 2004, and has been on plavix ever since, will get records from cardiologist (which the patient does not see anymore due to lack of insurance) and will see if the patient needs to be on plavix anymore or not.  for now will give plavix samples and sign a document for patient to get plavix from company for free due to low income status.   His updated medication list for this problem includes:    Plavix 75 Mg Tabs (Clopidogrel bisulfate) .Marland Kitchen... Take 1 tablet by mouth daily.    Metoprolol Succinate 25 Mg Xr24h-tab (Metoprolol succinate) .Marland Kitchen... Take 1 tablet by mouth daily.    Lisinopril 2.5 Mg Tabs (Lisinopril) .Marland Kitchen... Take 1 tablet by mouth daily.    Aspir-trin 325 Mg Tbec (Aspirin) .Marland Kitchen... Take 1 tablet by mouth daily.  Complete Medication List: 1)  Plavix 75 Mg Tabs (Clopidogrel bisulfate) .... Take 1 tablet by mouth daily. 2)  Metoprolol Succinate 25 Mg Xr24h-tab (Metoprolol succinate) .... Take 1 tablet by mouth daily. 3)  Oxycodone-acetaminophen 5-325 Mg Tabs (Oxycodone-acetaminophen) .... Take 1 tablet by mouth every 6 hour for pain as needed. 4)  Lisinopril 2.5 Mg Tabs (Lisinopril) .... Take 1 tablet by mouth daily. 5)  Aspir-trin 325 Mg Tbec (Aspirin) .... Take 1 tablet by mouth daily. 6)  Lipitor 20 Mg Tabs (Atorvastatin calcium) .... Take 1  tablet by mouth  daily. 7)  Calcium 500 Mg Tabs (Calcium carbonate) .... Take 1 tablet once a day. 8)  Prilosec 20 Mg Cpdr (Omeprazole) .... Take 1 tablet by mouth daily.  Other Orders: T-Hemoccult Card-Multiple (take home) ([id number])  Patient Instructions: 1)  Please schedule a follow-up appointment in 1 month. Prescriptions: PLAVIX 75 MG TABS (CLOPIDOGREL BISULFATE) Take 1 tablet by mouth daily.  #30 x 1   Entered and Authorized by:   Darnelle Maffucci MD   Signed by:   Darnelle Maffucci MD on 11/28/2009   Method used:   Print then Give to Patient   RxID:   [id number]   Prevention & Chronic Care Immunizations   Influenza vaccine: Not documented   Influenza vaccine deferral: Refused  (11/28/2009)    Tetanus booster: Not documented  Td booster deferral: Deferred  (11/28/2009)    Pneumococcal vaccine: Not documented  Colorectal Screening   Hemoccult: Not documented   Hemoccult action/deferral: Ordered  (11/28/2009)    Colonoscopy: Not documented   Colonoscopy action/deferral: Refused  (11/28/2009)  Other Screening   PSA: Not documented   PSA action/deferral: Discussed-PSA declined  (11/28/2009)   Smoking status: quit  (11/28/2009)  Lipids   Total Cholesterol: Not documented   Lipid panel action/deferral: Deferred   LDL: Not documented   LDL Direct: Not documented   HDL: Not documented   Triglycerides: Not documented    SGOT (AST): Not documented   BMP action: Deferred   SGPT (ALT): Not documented CMP ordered    Alkaline phosphatase: Not documented   Total bilirubin: Not documented    Lipid flowsheet reviewed?: Yes   Progress toward LDL goal: At goal  Hypertension   Last Blood Pressure: 121 / 74  (11/28/2009)   Serum creatinine: Not documented   BMP action: Deferred   Serum potassium Not documented CMP ordered     Hypertension flowsheet reviewed?: Yes   Progress toward BP goal: At goal  Self-Management Support :   Personal Goals (by the next clinic  visit) :      Personal blood pressure goal: 140/90  (11/28/2009)     Personal LDL goal: 100  (11/28/2009)    Patient will work on the following items until the next clinic visit to reach self-care goals:     Medications and monitoring: take my medicines every day  (11/28/2009)     Eating: eat more vegetables, eat foods that are low in salt, eat baked foods instead of fried foods  (11/28/2009)     Activity: park at the far end of the parking lot  (11/28/2009)    Hypertension self-management support: Written self-care plan  (11/28/2009)   Hypertension self-care plan printed.    Lipid self-management support: Not documented    Nursing Instructions: Provide Hemoccult cards with instructions (see order)   Process Orders Check Orders Results:     [facility]: ABN not required for this insurance Tests Sent for requisitioning (November 28, 2009 5:28 PM):     11/28/2009: [facility] -- T-CBC w/Diff [[id number]] (signed)     11/28/2009: [facility] -- T-Comprehensive Metabolic Panel [phone] (signed)

## 2010-12-19 NOTE — Progress Notes (Signed)
Summary: phone/gg  Phone Note Call from Patient   Summary of Call: Pt Mother, Kendal Hymen called with question about plavix.  She said at hospital d/c  you told her you would reapply forpt's  plavix and send the medication  to Dr Bensimhon's office. Corinda Gubler) Has this been done? Pt # [id number] Initial call taken by: Merrie Roof RN,  May 02, 2010 12:28 PM  Follow-up for Phone Call        I do not say that and I do not have how to apply for plavix assistance, since we do not manage medications. I know that Corinda Gubler could potentially help him with that or they we need to do the whole process they did last time using the cardiology office to stock the medications for them; per patient's mother in law and patient's wife he was already a approved for it.     Appended Document: phone/gg Pt informed

## 2010-12-19 NOTE — Progress Notes (Signed)
Summary: phone/gg  Phone Note Call from Patient   Summary of Call: Call from  family asking about paper work for pt's plavix from [facility] [phone]).  Pt's application was rejected because Dr Rosemarie Ax license # was not acceptable??  The form will need to be filled out by an  attending and resubmitted with financial information.  Pt was called and informed. I have a blank form If you will be willing to fill out. Pt # [id number] Initial call taken by: Merrie Roof RN,  January 27, 2010 10:19 AM  Follow-up for Phone Call        i am night float this month..  If you leave the paper in my box, i can go down to the clinic at night and fill it out.  Follow-up by: Darnelle Maffucci MD,  January 27, 2010 7:00 PM  Additional Follow-up for Phone Call Additional follow up Details #1::        I check my box today, and there for no paper from Mr accord to fill out. Additional Follow-up by: Darnelle Maffucci MD,  February 02, 2010 4:39 AM    Additional Follow-up for Phone Call Additional follow up Details #2::    I have talked to pt and informed him that this med can't be sent to clinic.  He will have to go through [facility] to get this med.  They have a MD that will fill out all the forms. Patient/caller verbalizes understanding of these instructions.  Follow-up by: Merrie Roof RN,  February 23, 2010 3:47 PM

## 2010-12-19 NOTE — Progress Notes (Signed)
Summary: pt needs information regarding plavix   Phone Note Call from Patient Call back at [phone]   Caller: Patient/Bonnie Susette Racer Mother in law Reason for Call: Talk to Nurse, Talk to Doctor Summary of Call: all the information was not sent to [facility] for pt plavix and he is completely out please call company and give information at [phone] Initial call taken by: Omer Jack,  May 15, 2010 12:09 PM  Follow-up for Phone Call        forgot Dr Melburn Popper license info form refaxed, Mrs Susette Racer aware samples left at front desk for pt sign     Prescriptions: PLAVIX 75 MG TABS (CLOPIDOGREL BISULFATE) Take 1 tablet by mouth daily.  #8 x 0   Entered by:   Meredith Staggers, RN   Authorized by:   Dolores Patty, MD, [facility]   Signed by:   Meredith Staggers, RN on 05/15/2010   Method used:   Samples Given   RxID:   [id number]

## 2010-12-19 NOTE — Miscellaneous (Signed)
Summary: Hospital Admission.  INTERNAL MEDICINE ADMISSION HISTORY AND PHYSICAL PCP: Dr. Gilford Rile ZO:[id number] on exertion  HPI: 55 y/o man with PMH of CAD s/p CABG in Sept 2004 with restonisis s/p PTCA and stenting in Oct 2004. [facility] then comes to the ED complaining of dyspnea on exertion since last 2-3 months. Has not got any worse. Has not tried any medication or for that. He continues to pursue his work when he feels short of breath and says that it goes away by itself. He also had occaisonal sharp shooting chest pain which initiates on the right side and radiates to the left chest and arm. He gets these on exertion as well as on rest. Releived without any intervention. No change in the frequency and intesity of the chest pain since its onset. He has been off his plavix since last 3 months due to financial reasons. He has been taking his other medicines regularly.   He denies persitent chest pain, shortness of breath, lef edema, orthopnea, cough or other complaints.   ALLERGIES: sulfur- rash  PAST MEDICAL HISTORY: History of coronary artery disease.     A. Status post coronary artery bypass grafting on August 02, 2003 to        five vessels by Dr. Tyrone Sage.     B. Repeat admission for MIt in Oct 2004. Cath showed occluded vein graft which was stented. There was a repeat occlusion after that and the patient had been medically managed since. He had a myoview in 2007 which showed lateral wall ischemia which was persistent before as well.    Hyperlipidemia- well controlled Congestive heart failure EF 40% as of 2007. GERD Hypertension- controlled   MEDICATIONS:  METOPROLOL SUCCINATE 25 MG XR24H-TAB (METOPROLOL SUCCINATE) Take 1 tablet by mouth daily. LISINOPRIL 2.5 MG TABS (LISINOPRIL) Take 1 tablet by mouth daily. ASPIR-TRIN 325 MG TBEC (ASPIRIN) Take 1 tablet by mouth daily. LIPITOR 20 MG TABS (ATORVASTATIN CALCIUM) Take 1  tablet by mouth  daily. CALCIUM 500 MG TABS (CALCIUM CARBONATE) Take 1 tablet once a day. nexium 40 mg by mouth once daily  Plavix- 75 mg- off since last 3 moths due to financial reasons.    SOCIAL HISTORY: Married, lives with wife in [place], [place] Former Smoker, quit 2004. Smoked 3 ppd before Alcohol use-quit in 2002. Used to drink "a lot of" hard liquor before Drug use-h/o marijuana in past, Quit in 2002 Occupation:[employer]   Insurance: none  FAMILY HISTORY Mother: died heart problems, brother has CAD too Father : Diabetes and CAD  ROS:as per HPI  VITALS:  T: 97.4  P: 66  BP: 119/88  R:16  O2SAT: 98%  ON: RA  PHYSICAL EXAM:  Gen: Lying in bed comfortably HEENT: PERRLA, EOMI CVS: RRR. no murmurs Respi: CTA b/l GI:: s/NT/ND +BS Extremity: no edema, cald tenderness Neuro: grossly non focal  LABS:   Sodium (NA)                              139               135-145          mEq/L  Potassium (K)                            4.0  3.5-5.1          mEq/L  Chloride                                 106               96-112           mEq/L  CO2                                      24                19-32            mEq/L  Glucose                                  89                70-99            mg/dL  BUN                                      14                6-23             mg/dL  Creatinine                               1.05              0.4-1.5          mg/dL  GFR, Est Non African American            >60               >60              mL/min  GFR, Est African American                >60               >60              mL/min  Calcium        8.8  WBC                                      7.3               4.0-10.5         K/uL  RBC                                      4.79              4.22-5.81        MIL/uL  Hemoglobin (HGB)                         14.4  13.0-17.0        g/dL  Hematocrit (HCT)                         42.1               39.0-52.0        %  MCV                                      88.0              78.0-100.0       fL  MCHC                                     34.1              30.0-36.0        g/dL  RDW                                      13.4              11.5-15.5        %  Platelet Count (PLT)                     173               150-400          K/uL  Neutrophils, %                           55                43-77            %  Lymphocytes, %                           28                12-46            %  Monocytes, %                             11                3-12             %  Eosinophils, %                           5                 0-5              %  Basophils, %                             1                 0-1              %  Neutrophils, Absolute  4.0               1.7-7.7          K/uL  Lymphocytes, Absolute                    2.1               0.7-4.0          K/uL  Monocytes, Absolute                      0.8               0.1-1.0          K/uL  Eosinophils, Absolute                    0.4               0.0-0.7          K/uL  Basophils, Absolute                      0.0               0.0-0.1          K/uL  CE:   Creatine Kinase, Total                   98                7-232            U/L  CK, MB                                   2.4               0.3-4.0          ng/mL  Troponin I                               0.01              0.00-0.06        ng/mL  EKG CXR: No acute changes, COPD   ASSESSMENT AND PLAN: 1. DOE with sharp shooting pain Given complicated cardiac history and not on plavix, concern for ACS is foremost. EKG does not show any new ST/T changes. The AV block can be from his beta blocekr.  Will admit to tele bed and monitor cardiac enzymes. Will consult [place] cardiology for furhter management. Will hold 2D Echo for now until cards sees the patient. PAtient is not having any symtomps currently and 1st set of CE is negative. so will not give  heparin for now. But will restart plavix and continue aspirin.  Will check FLP, TSH for risk stratification  2. HTN- continue home medication 3. HLD- continue home medication 4. COPD- on CXR. No mention in records and patient not aware of it. Past h/o excessive smoking makes it a definite possibitliy. His breathing stable currently. No intervention at this time. 5. GERD: Nexium  6.  DVT PX: Lovenox

## 2010-12-19 NOTE — Progress Notes (Signed)
Summary: refill meds  Phone Note Refill Request Call back at Home Phone [phone] Call back at [phone] Message from:  bonnie hardy  on May 02, 2010 12:22 PM  Refills Requested: Medication #1:  PLAVIX 75 MG TABS Take 1 tablet by mouth daily. Chriss Czar [phone]    Method Requested: Fax to Mail Away Pharmacy Initial call taken by: Lorne Skeens,  May 02, 2010 12:24 PM Reason for Call: Talk to Nurse  Follow-up for Phone Call        per Kendal Hymen, pt has been approved for Plavix but company would send to [facility] clinic and they were going to have company ship to [place], have not received med, will call [facility] in AM to f/u Meredith Staggers, RN  May 03, 2010 5:39 PM   Additional Follow-up for Phone Call Additional follow up Details #1::        spoke w/[facility] they state they have not received application or proof of income, they do show that they had spoken w/pts family in March and had mailed them an application but had never recieved it back, spoke w/Mrs Susette Racer she states tehy completed and sent back, she believes they might have a copy if not they will come by office tom and fill out new form Meredith Staggers, RN  May 04, 2010 1:35 PM      Appended Document: refill meds pts wife and  mother in law came by today and filed out plavix form adn brought proof of income, info was all faxed to [facility]

## 2010-12-19 NOTE — Letter (Signed)
Summary: [facility]   Imported By: Margie Billet 02/22/2010 11:12:18  _____________________________________________________________________  External Attachment:    Type:   Image     Comment:   External Document

## 2010-12-21 ENCOUNTER — Ambulatory Visit: Payer: Self-pay | Admitting: Adult Health

## 2010-12-21 ENCOUNTER — Ambulatory Visit: Admit: 2010-12-21 | Payer: Self-pay | Admitting: Adult Health

## 2010-12-21 NOTE — Progress Notes (Signed)
Summary: plavix  Phone Note Outgoing Call   Call placed by: Meredith Staggers, RN,  November 24, 2010 9:32 AM Call placed to: Gypsy Decant, mother-in-law Summary of Call: called and left mess that pts Plavix had arrived from [facility]

## 2011-01-08 ENCOUNTER — Other Ambulatory Visit: Payer: Self-pay | Admitting: *Deleted

## 2011-01-08 MED ORDER — LISINOPRIL 2.5 MG PO TABS: 2.5000 mg | ORAL_TABLET | Freq: Every day | ORAL | Status: DC

## 2011-01-23 ENCOUNTER — Encounter: Payer: Self-pay | Admitting: Internal Medicine

## 2011-02-02 ENCOUNTER — Telehealth: Payer: Self-pay | Admitting: *Deleted

## 2011-02-05 LAB — BASIC METABOLIC PANEL
BUN: 13 mg/dL (ref 6–23)
BUN: 16 mg/dL (ref 6–23)
BUN: 14 mg/dL (ref 6–23)
CO2: 23 mEq/L (ref 19–32)
CO2: 26 mEq/L (ref 19–32)
CO2: 24 mEq/L (ref 19–32)
CO2: 27 mEq/L (ref 19–32)
Calcium: 8.3 mg/dL — ABNORMAL LOW (ref 8.4–10.5)
Calcium: 9.1 mg/dL (ref 8.4–10.5)
Calcium: 8.8 mg/dL (ref 8.4–10.5)
Calcium: 8.9 mg/dL (ref 8.4–10.5)
Chloride: 109 mEq/L (ref 96–112)
Chloride: 109 mEq/L (ref 96–112)
Chloride: 106 mEq/L (ref 96–112)
Creatinine, Ser: 0.83 mg/dL (ref 0.4–1.5)
Creatinine, Ser: 0.9 mg/dL (ref 0.4–1.5)
Creatinine, Ser: 1 mg/dL (ref 0.4–1.5)
Creatinine, Ser: 1.05 mg/dL (ref 0.4–1.5)
GFR calc Af Amer: >60 mL/min (ref >60)
GFR calc Af Amer: >60 mL/min (ref >60)
GFR calc Af Amer: >60 mL/min (ref >60)
GFR calc non Af Amer: >60 mL/min (ref >60)
GFR calc non Af Amer: >60 mL/min (ref >60)
GFR calc non Af Amer: >60 mL/min (ref >60)
Glucose, Bld: 103 mg/dL — ABNORMAL HIGH (ref 70–99)
Glucose, Bld: 89 mg/dL (ref 70–99)
Glucose, Bld: 89 mg/dL (ref 70–99)
Glucose, Bld: 97 mg/dL (ref 70–99)
Potassium: 3.9 mEq/L (ref 3.5–5.1)
Potassium: 4.4 mEq/L (ref 3.5–5.1)
Potassium: 4 mEq/L (ref 3.5–5.1)
Sodium: 137 mEq/L (ref 135–145)
Sodium: 139 mEq/L (ref 135–145)
Sodium: 139 mEq/L (ref 135–145)
Sodium: 140 mEq/L (ref 135–145)

## 2011-02-05 LAB — CK TOTAL AND CKMB (NOT AT ARMC)
CK, MB: 2.4 ng/mL (ref 0.3–4.0)
Relative Index: INVALID (ref 0.0–2.5)
Relative Index: INVALID (ref 0.0–2.5)
Total CK: 98 U/L (ref 7–232)

## 2011-02-05 LAB — CARDIAC PANEL(CRET KIN+CKTOT+MB+TROPI)
CK, MB: 3.9 ng/mL (ref 0.3–4.0)
Relative Index: 3 — ABNORMAL HIGH (ref 0.0–2.5)
Relative Index: INVALID (ref 0.0–2.5)
Total CK: 128 U/L (ref 7–232)
Total CK: 84 U/L (ref 7–232)
Troponin I: 0.04 ng/mL (ref 0.00–0.06)
Troponin I: <0.01 ng/mL (ref 0.00–0.06)

## 2011-02-05 LAB — DIFFERENTIAL
Basophils Absolute: 0 10*3/uL (ref 0.0–0.1)
Basophils Relative: 1 % (ref 0–1)
Eosinophils Absolute: 0.4 10*3/uL (ref 0.0–0.7)
Eosinophils Relative: 5 % (ref 0–5)
Lymphocytes Relative: 28 % (ref 12–46)
Lymphs Abs: 2.1 10*3/uL (ref 0.7–4.0)
Monocytes Absolute: 0.8 10*3/uL (ref 0.1–1.0)
Monocytes Relative: 11 % (ref 3–12)
Neutro Abs: 4 10*3/uL (ref 1.7–7.7)
Neutrophils Relative %: 55 % (ref 43–77)

## 2011-02-05 LAB — POCT I-STAT 3, VENOUS BLOOD GAS (G3P V)
Bicarbonate: 23 mEq/L (ref 20.0–24.0)
Bicarbonate: 23.6 mEq/L (ref 20.0–24.0)
TCO2: 24 mmol/L (ref 0–100)
TCO2: 25 mmol/L (ref 0–100)
pCO2, Ven: 41.1 mmHg — ABNORMAL LOW (ref 45.0–50.0)
pCO2, Ven: 44.1 mmHg — ABNORMAL LOW (ref 45.0–50.0)
pH, Ven: 7.338 — ABNORMAL HIGH (ref 7.250–7.300)
pH, Ven: 7.356 — ABNORMAL HIGH (ref 7.250–7.300)
pO2, Ven: 36 mmHg (ref 30.0–45.0)
pO2, Ven: 36 mmHg (ref 30.0–45.0)

## 2011-02-05 LAB — CBC
HCT: 36.2 % — ABNORMAL LOW (ref 39.0–52.0)
HCT: 39.8 % (ref 39.0–52.0)
HCT: 42.1 % (ref 39.0–52.0)
Hemoglobin: 12.4 g/dL — ABNORMAL LOW (ref 13.0–17.0)
Hemoglobin: 13.7 g/dL (ref 13.0–17.0)
Hemoglobin: 14.4 g/dL (ref 13.0–17.0)
Hemoglobin: 14.8 g/dL (ref 13.0–17.0)
MCHC: 34.3 g/dL (ref 30.0–36.0)
MCHC: 34.4 g/dL (ref 30.0–36.0)
MCHC: 33.8 g/dL (ref 30.0–36.0)
MCHC: 34.1 g/dL (ref 30.0–36.0)
MCV: 88.5 fL (ref 78.0–100.0)
MCV: 88.9 fL (ref 78.0–100.0)
MCV: 88 fL (ref 78.0–100.0)
Platelets: 177 10*3/uL (ref 150–400)
Platelets: 189 10*3/uL (ref 150–400)
Platelets: 173 10*3/uL (ref 150–400)
RBC: 4.07 MIL/uL — ABNORMAL LOW (ref 4.22–5.81)
RBC: 4.5 MIL/uL (ref 4.22–5.81)
RBC: 4.79 MIL/uL (ref 4.22–5.81)
RBC: 4.97 MIL/uL (ref 4.22–5.81)
RDW: 13.6 % (ref 11.5–15.5)
RDW: 13.6 % (ref 11.5–15.5)
RDW: 13.4 % (ref 11.5–15.5)
WBC: 5.2 10*3/uL (ref 4.0–10.5)
WBC: 7.3 10*3/uL (ref 4.0–10.5)
WBC: 7.3 10*3/uL (ref 4.0–10.5)

## 2011-02-05 LAB — POCT I-STAT 3, ART BLOOD GAS (G3+)
Acid-base deficit: 2 mmol/L (ref 0.0–2.0)
Bicarbonate: 23 mEq/L (ref 20.0–24.0)
pCO2 arterial: 37.9 mmHg (ref 35.0–45.0)
pH, Arterial: 7.391 (ref 7.350–7.450)
pO2, Arterial: 77 mmHg — ABNORMAL LOW (ref 80.0–100.0)

## 2011-02-05 LAB — LIPID PANEL: Cholesterol: 137 mg/dL (ref 0–200)

## 2011-02-05 LAB — HEPATIC FUNCTION PANEL
Alkaline Phosphatase: 92 U/L (ref 39–117)
Bilirubin, Direct: 0.1 mg/dL (ref 0.0–0.3)
Indirect Bilirubin: 0.5 mg/dL (ref 0.3–0.9)
Total Bilirubin: 0.6 mg/dL (ref 0.3–1.2)

## 2011-02-05 LAB — TSH: TSH: 2.223 u[IU]/mL (ref 0.350–4.500)

## 2011-02-05 LAB — APTT: aPTT: 29 seconds (ref 24–37)

## 2011-02-05 LAB — BRAIN NATRIURETIC PEPTIDE: Pro B Natriuretic peptide (BNP): 54 pg/mL (ref 0.0–100.0)

## 2011-02-05 LAB — PROTIME-INR
INR: 1.03 (ref 0.00–1.49)
Prothrombin Time: 13.4 seconds (ref 11.6–15.2)
Prothrombin Time: 13 seconds (ref 11.6–15.2)

## 2011-02-05 LAB — TROPONIN I: Troponin I: 0.01 ng/mL (ref 0.00–0.06)

## 2011-02-06 NOTE — Progress Notes (Signed)
  Phone Note Outgoing Call   Call placed by: Deliah Goody, RN,  February 02, 2011 8:12 AM Summary of Call: pt aware plavix from [facility] at the front desk for pick up Deliah Goody, RN  February 02, 2011 8:13 AM

## 2011-02-20 LAB — BASIC METABOLIC PANEL
BUN: 7 mg/dL (ref 6–23)
BUN: 9 mg/dL (ref 6–23)
Calcium: 8.3 mg/dL — ABNORMAL LOW (ref 8.4–10.5)
Chloride: 100 mEq/L (ref 96–112)
Chloride: 107 mEq/L (ref 96–112)
Creatinine, Ser: 0.87 mg/dL (ref 0.4–1.5)
Creatinine, Ser: 0.91 mg/dL (ref 0.4–1.5)
GFR calc Af Amer: >60 mL/min (ref >60)
GFR calc non Af Amer: >60 mL/min (ref >60)
GFR calc non Af Amer: >60 mL/min (ref >60)
Glucose, Bld: 84 mg/dL (ref 70–99)
Glucose, Bld: 92 mg/dL (ref 70–99)
Potassium: 4 mEq/L (ref 3.5–5.1)
Potassium: 4.1 mEq/L (ref 3.5–5.1)
Sodium: 132 mEq/L — ABNORMAL LOW (ref 135–145)
Sodium: 139 mEq/L (ref 135–145)

## 2011-02-20 LAB — COMPREHENSIVE METABOLIC PANEL
AST: 39 U/L — ABNORMAL HIGH (ref 0–37)
Albumin: 3.3 g/dL — ABNORMAL LOW (ref 3.5–5.2)
BUN: 11 mg/dL (ref 6–23)
Calcium: 8.6 mg/dL (ref 8.4–10.5)
Creatinine, Ser: 1.09 mg/dL (ref 0.4–1.5)
GFR calc Af Amer: >60 mL/min (ref >60)
GFR calc non Af Amer: >60 mL/min (ref >60)

## 2011-02-20 LAB — LIPID PANEL
Cholesterol: 90 mg/dL (ref 0–200)
LDL Cholesterol: 45 mg/dL (ref 0–99)
Total CHOL/HDL Ratio: 2.7 RATIO
Triglycerides: 61 mg/dL (ref <150)
VLDL: 12 mg/dL (ref 0–40)

## 2011-02-20 LAB — URINE DRUGS OF ABUSE SCREEN W ALC, ROUTINE (REF LAB)
Amphetamine Screen, Ur: NEGATIVE
Barbiturate Quant, Ur: NEGATIVE
Cocaine Metabolites: NEGATIVE
Creatinine,U: 128.6 mg/dL
Methadone: NEGATIVE
Propoxyphene: NEGATIVE

## 2011-02-20 LAB — HEPATITIS PANEL, ACUTE
Hep B C IgM: NEGATIVE
Hepatitis B Surface Ag: NEGATIVE

## 2011-02-20 LAB — CBC
HCT: 34.5 % — ABNORMAL LOW (ref 39.0–52.0)
HCT: 36 % — ABNORMAL LOW (ref 39.0–52.0)
HCT: 36.2 % — ABNORMAL LOW (ref 39.0–52.0)
Hemoglobin: 12.2 g/dL — ABNORMAL LOW (ref 13.0–17.0)
Hemoglobin: 12.2 g/dL — ABNORMAL LOW (ref 13.0–17.0)
Hemoglobin: 12.6 g/dL — ABNORMAL LOW (ref 13.0–17.0)
MCHC: 33.7 g/dL (ref 30.0–36.0)
MCHC: 34.2 g/dL (ref 30.0–36.0)
MCV: 88.9 fL (ref 78.0–100.0)
MCV: 88.9 fL (ref 78.0–100.0)
MCV: 89.7 fL (ref 78.0–100.0)
Platelets: 170 10*3/uL (ref 150–400)
Platelets: 171 10*3/uL (ref 150–400)
RBC: 4.04 MIL/uL — ABNORMAL LOW (ref 4.22–5.81)
RBC: 4.14 MIL/uL — ABNORMAL LOW (ref 4.22–5.81)
RDW: 13.1 % (ref 11.5–15.5)
RDW: 13.4 % (ref 11.5–15.5)
WBC: 11.1 10*3/uL — ABNORMAL HIGH (ref 4.0–10.5)
WBC: 11.6 10*3/uL — ABNORMAL HIGH (ref 4.0–10.5)
WBC: 13.6 10*3/uL — ABNORMAL HIGH (ref 4.0–10.5)

## 2011-02-20 LAB — POCT CARDIAC MARKERS
CKMB, poc: 1.5 ng/mL (ref 1.0–8.0)
Myoglobin, poc: 136 ng/mL (ref 12–200)
Troponin i, poc: <0.05 ng/mL (ref 0.00–0.09)

## 2011-02-20 LAB — PROTIME-INR: Prothrombin Time: 15.9 seconds — ABNORMAL HIGH (ref 11.6–15.2)

## 2011-02-20 LAB — CULTURE, BLOOD (ROUTINE X 2)

## 2011-02-20 LAB — GRAM STAIN

## 2011-02-20 LAB — WOUND CULTURE

## 2011-02-20 LAB — BILIRUBIN, FRACTIONATED(TOT/DIR/INDIR): Bilirubin, Direct: 0.2 mg/dL (ref 0.0–0.3)

## 2011-03-25 ENCOUNTER — Other Ambulatory Visit: Payer: Self-pay | Admitting: Internal Medicine

## 2011-04-06 NOTE — Cardiovascular Report (Signed)
[name] [name], [name] [name]                           ACCOUNT NO.:  [id number]   MEDICAL RECORD NO.:  [id number]                   PATIENT TYPE:  INP   LOCATION:  2924                                 FACILITY:  [facility]   PHYSICIAN:  Thereasa Solo. Little, M.D.              DATE OF BIRTH:  05/24/1956   DATE OF PROCEDURE:  09/20/2003  DATE OF DISCHARGE:                              CARDIAC CATHETERIZATION   INDICATIONS FOR TEST:  This is a 56 year old gentleman that presented on  September 17, 2003 with an acute lateral myocardial infarction with total  occlusion of the saphenous vein graft to the OM.  It was obstructed and full  of clot.  Nicki Guadalajara, M.D. was able to put multiple overlapping stents  beginning at the distal anastomotic sac and extending all the way to the  ostium of the graft.  Over the weekend the patient continued to have some  mild discomfort and is brought back to the catheterization laboratory for  evaluation of this.   At the time of his initial catheterization on Friday, he also had an area of  decreased flow that raised a concern if these were thrombus or a dissection  of the distal portion of the saphenous vein graft to his right coronary  artery.  Plans were made for intervention on this area today as part of the  procedure.   PROCEDURE:  The patient had a temporary pacemaker left over from Friday in  his right femoral vein that had been left in place.  Because of this the  patient was prepped and draped in the usual sterile fashion exposing the  left femoral area.  After local anesthetic with 1% Xylocaine the Seldinger  technique was employed and a 6-French introducer sheath was placed into the  left femoral vein.  Initially, a JR4 6-French guide catheter was used.  Evaluation of the saphenous vein graft to the OM that had been intervened  upon on September 17, 2003 revealed 100% occlusion in the proximal stent.  On  fluoroscopy the stents extended all the way down to  what appeared to be the  distribution of the OM but there was no flow.   The saphenous vein graft to the right coronary artery was then evaluated and  the graft appeared to be widely patent.  The area that had diminished flow  in the distal portion of the vessel now had brisk flow and appeared to be  normal.  The most likely explanation is that there was some thrombus  formation or there was streaming of the contrast.  Clearly the vessel is  widely patent at this time.   Because of the concern of his circumflex, I relooked at his native anatomy  on the left.  His LAD had a long ostial 90% area lesion of narrowing and the  distal portion of the LAD had bidirectional flow from the internal mammary  artery graft.  The first diagonal which was also grafted was well visualized  and there was retrograde flow into the saphenous vein graft that led to the  diagonal.   The circumflex system which was a relatively small 2.5 mm vessel had an area  of about 50% narrowing in the proximal portion.  OM #1 was 100% occluded.  OM #2 was widely patent.   At this point the patient has now been pain-free for the last 16 hours.  I  suspect his saphenous vein graft to the OM closed on Saturday when he  complained of mild chest burning.  Since he is pain-free and hemodynamically  stable I did not opt to try to reopen the multiple stents nor did I try to  open the native circulation.  My concern about trying to fix it via the  native circulation is that the distal stent in the saphenous vein graft  extended across the anastomotic site and into the ongoing OM vessel making  intervention into this area less likely to be successful.   At this point the patient will be managed with medical therapy.  His  arterial sheath and the temporary pacemaker and sheath will be removed  today.  I would have stop the IV heparin, stop the IV nitroglycerin, stop  the Integrilin.  Will ambulate him for 48 hours.  He should be  ready for  discharge later this week.                                               Thereasa Solo. Little, M.D.    ABL/MEDQ  D:  09/20/2003  T:  09/20/2003  Job:  [id number]   cc:   Darlin Priestly, M.D.  [address], [address]  [place]  [place] [address]  Fax: [phone]   Olene Craven, M.D.  [address]  [address]  [place]  [place] [address]  Fax: [phone]   CVTS

## 2011-04-06 NOTE — H&P (Signed)
NAMEMarland Kitchen  [name], [name]                           ACCOUNT NO.:  [id number]   MEDICAL RECORD NO.:  [id number]                   PATIENT TYPE:  EMS   LOCATION:  MAJO                                 FACILITY:  [facility]   PHYSICIAN:  Darlin Priestly, M.D.             DATE OF BIRTH:  August 11, 1956   DATE OF ADMISSION:  08/01/2003  DATE OF DISCHARGE:                                HISTORY & PHYSICAL   CHIEF COMPLAINT:  Chest pain.  EKG with acute myocardial infarction.   HISTORY OF PRESENT ILLNESS:  A 55 year old white male with history of chest  pain beginning approximately three weeks ago.  It would occur on occasion.  He saw his primary care physician, Dr. Barbee Shropshire, who was planning for a  cardiology work-up.  The initial chest pain lasted 30 minutes, he was short  of breath with nausea, but no other episodes, except when he was going up  and down stairs he would get some chest pain that would resolve with rest.  This morning about 1 o'clock on August 01, 2003, he was awakened from  sleep with significant chest pain, radiation down both arms, shortness of  breath, and vomiting.  It went away, he went back to sleep, it came back  again, he called his girlfriend to come over, it again resolved, he went  back to sleep, and then this morning it returned, would not go away, and he  came on to the emergency room, and is vomiting in the emergency room,  appearing in acute distress.  EKG with significant ST elevations in his  anterior leads.  He was given aspirin, a nitroglycerin drip, IV Lopressor  was started, and he probably got about 2.5 mg.  He had some ventricular  tachycardia.  He was started on heparin and Integrelin once to go.  In the  emergency room, with a combination of morphine and nitroglycerin drip, the  pain resolved down to a 1/10.   Dr. Jenne Campus saw him, and discussed with the patient the need for emergent  cardiac catheterization, the patient agreed, and the girlfriend was with  the  patient at the time.  He was brought to the catheterization laboratory for  emergent catheterization and possible rescue PTCA.   PAST MEDICAL HISTORY:  1. No cardiac history, except for the last three weeks as described.  2. Syncope with anxiety attacks approximately 10 years ago; none recently.  3. History of tobacco abuse.  4. Alcohol use until two years ago.  He stopped in December of 2002.   ALLERGIES:  SULFA.  No shellfish allergy at all.   OUTPATIENT MEDICATIONS:  1. Advil.  2. Nexium.   FAMILY HISTORY:  Father is living with diabetes and history of myocardial  infarction.  Mother died with heart disease and cancer.  One brother at age  22 had an acute MI and died.  One brother with unknown history.  SOCIAL HISTORY:  He is separated.  He has two children.  He smokes a pack a  day and has for about 40 years.  He stopped alcohol in December of 2002, and  works for a [employer] claims handler.   REVIEW OF SYSTEMS:  GENERAL:  No recent weight loss or gains.  CARDIAC:  See  HPI.  LUNGS:  See HPI.  GI:  Indigestion on Nexium.  No diarrhea or  constipation.  No melena.  No vomiting blood.  GU:  No dysuria, no  hematuria.  NEUROLOGIC:  Occasional lightheadedness, but no recent syncopal  episodes.  No strokes.  ENDOCRINE:  No diabetes or thyroid disease.  MUSCULOSKELETAL:  Negative complaints.  Does have a cyst on his foot.  HEMATOLOGY:  Negative.   PHYSICAL EXAMINATION:  VITAL SIGNS:  Blood pressure 151/105, pulse 70,  respirations 28, temperature 97.4.  Oxygen saturation on room air 100%.  GENERAL:  Alert and oriented white male in acute distress with chest pain,  vomiting, and shortness of breath.  SKIN:  Warm and dry.  Brisk capillary refill.  HEENT:  Sclerae are clear.  He does have dentures.  NECK:  Supple.  No JVD, no bruits.  HEART:  S1 and S2.  Regular rate and rhythm without murmurs, gallops, rubs,  or clicks.  LUNGS:  Decreased breath sounds  throughout.  No rales.  No wheezes.  ABDOMEN:  Soft and nontender.  Positive bowel sounds.  EXTREMITIES:  There 2+ pedal pulses bilaterally.  Has a cyst on the top of  his right foot, nonpainful.  NEUROLOGIC:  Alert and oriented x3.  Moves all extremities.  Positive facial  symmetry.   LABORATORY DATA:  Sodium 140, potassium 4.0, BUN 9, creatinine 0.7, glucose  up at 139.  Hemoglobin 15.9, hematocrit 46.2, platelets 218.  WBC's slightly  up at 12.7.  PT 12.4, INR of 0.9, and PTT 31.   EKG revealed acute ST elevations in V2, V3, V4, V5, V6, and also in leads I,  II, slightly up in III, and aVF.  Reciprocal changes in aVR.   Myocardial markers - CK-MB was 2.7, myo was 82.6, troponin I less than 0.05.  Further CK-MB's were pending.   Portable chest x-ray revealed no acute disease, reviewed by Dr. Jenne Campus.   IMPRESSION:  1. Acute anterior myocardial infarction.  2. Nonsustained ventricular tachycardia secondary to #1.  3. Hypertension secondary to #1.   PLAN:  The patient catheterization laboratory emergently.  Probable rescue  PTCA stent.  Dr. Jenne Campus discussed cardiac catheterization risk with the  patient and his girlfriend.  They both agreed for procedure.      Darcella Gasman. Valarie Merino                     Darlin Priestly, M.D.    LRI/MEDQ  D:  08/01/2003  T:  08/02/2003  Job:  [id number]   cc:   Olene Craven, M.D.  [address]  [address]  [place]  [place] [address]  Fax: [phone]

## 2011-04-06 NOTE — Op Note (Signed)
[name] [name], [name] [name]                           ACCOUNT NO.:  [id number]   MEDICAL RECORD NO.:  [id number]                   PATIENT TYPE:  INP   LOCATION:  2007                                 FACILITY:  [facility]   PHYSICIAN:  Gwenith Daily. Tyrone Sage, M.D.            DATE OF BIRTH:  01-Sep-1956   DATE OF PROCEDURE:  08/01/2003  DATE OF DISCHARGE:                                 OPERATIVE REPORT   PREOPERATIVE DIAGNOSIS:  Acute anterior myocardial infarction with three  vessel coronary artery disease.   POSTOPERATIVE DIAGNOSIS:  Acute anterior myocardial infarction with three  vessel coronary artery disease.   PROCEDURE:  Coronary artery bypass grafting x 5 with the left internal  mammary to the left anterior descending coronary artery, reverse saphenous  vein graft to the first diagonal, reverse saphenous vein graft to the obtuse  marginal, sequential reverse saphenous vein graft to the acute marginal and  posterior descending with endovein harvesting.   SURGEON:  Gwenith Daily. Tyrone Sage, M.D.   FIRST ASSISTANT:  Rowe Clack, P.A.   INDICATIONS:  The patient is a 55 year old male who presented to the  emergency room with acute anterior EKG changes consistent with an anterior  myocardial infarction and a history of 18 hours of stuttering chest pain.  He was taken directly to catheterization lab and underwent cardiac  catheterization by Dr. Jenne Campus.  This demonstrated greater than 90% proximal  left anterior descending lesion involving the diagonal.  In addition, he had  an 80% circumflex lesion.  The right coronary artery had 95% stenosis  involving the acute marginal and continuation of the right coronary artery  with a decreased ejection fraction of 40%.  Because of the patient's  stuttering chest pain, marked EKG changes and critical anatomy, coronary  artery bypass grafting was recommended.  Portable chest x-ray raised the  issue of a left lung nodule, however, at the time of surgery,  palpation of  the left lung, no palpable mass could be appreciated.   DESCRIPTION OF PROCEDURE:  With Swan-Ganz and arterial line monitors placed,  the patient underwent general endotracheal anesthesia without incident.  The  skin of the chest and legs was prepped with Betadine and draped in the usual  sterile manner.  Using the [employer] endovein harvesting system, the vein was  harvested from the right leg and was of adequate caliber and quantity.  A  medium sternotomy was performed.  The left internal mammary artery was  dissected down as a pedicle graft.  The distal artery was divided and had  good free flow.  The pericardium was opened.  The patient had evidence of  anterior LV dysfunction.  He was systemically heparinized.  The ascending  aorta and the right atrium were cannulated, and in the aortic root vent  cardioplegia needle was introduced into the ascending aorta.  the patient  was placed on cardiopulmonary bypass 2.4 liters per minute  per m sq. The  sites of anastomosis were selected and dissected out of the epicardium. The  acute marginal branch extending along the inferior surface of the right  ventricle was identified as was the posterior descending coronary artery.  The left anterior descending coronary artery was identified.  The high  diagonal vessel was identified.  This was intramyocardial as was the obtuse  marginal intramyocardial.  The continuation of the circumflex was a very  small vessel and not bypassable.  The patient's body temperature was cooled  to 30 degrees.  The aortic cross clamp was applied and 500 cc of cold blood  potassium cardioplegia was administered.  With rapid diastolic arrest of the  heart, myocardial septal temperature was monitored throughout the cross  clamp.   Attention was turned first to the obtuse marginal coronary artery which was  opened and admitted at 1.5 mm probe.  This vessel was intramyocardial.  Using a running 7-0 Prolene a  distal anastomosis was performed.  Attention  was then turned to the highest diagonal which was opened and was also  intramyocardial.  The vessel was opened and admitted a 1 mm probe.  Using  running 7-0 Prolene, distal anastomosis was performed.  Attention was turned  to the acute marginal which was opened using diamond type side-to-side  anastomosis.  This was carried out and the distant extent of the same vein  was then carried onto the posterior descending coronary artery where a  distal anastomosis was performed with a running 7-0 Prolene.  Cold blood  cardioplegia was administered down the vein graft.  Attention was then  turned to the left anterior descending coronary artery between the mid and  distal third of the vessel.  The LAD was opened using a running 8-0 Prolene.  The left internal mammary artery was anastomosed to the left anterior  descending coronary artery.  With the release of the Edwards bulldog on the  mammary artery, there was appropriate rise in myocardial septal temperature.  The aortic cross clamp was removed with total cross clamp time of 70  minutes.  Partial occlusion clamp was placed on the ascending aorta.  Three  punch aortotomies were performed.  Each of the three vein grafts were  anastomosed to ascending aorta.  Air was evacuated from the grafts, and the  partial occlusion clamp was removed.  Sites of anastomoses were inspected  and were free of bleeding.  The patient was then ventilated, weaned from  cardiopulmonary bypass and low dose dopamine.  He remained hemodynamically  stable on low dose dopamine.  He was decannulated in the usual fashion.  Protamine and sulfate were administered.  With the operative field  hemostatic, two atrial and two ventricular pacing wires were applied.  Graft  markers were applied.  A left pleural tube and two mediastinal tubes were  left in place.  The sternum was closed with #6 stainless steel wire.  Fascia was closed with  interrupted 0 Vicryl, running 3-0 Vicryl in the subcutaneous  tissue, 4-0 subcuticular stitch in the skin edges.  Dry dressings were  applied.  Sponge and needle counts were reported as correct at the  completion of the procedure.  The patient was transferred to the surgical  intensive care unit for further postoperative care.   NOTE:  The patient's portable preoperative chest film had raised the issue  of a question of left mid lung zone lung nodule on palpation of the left  lung from bypass.  No definite  lung nodule could be appreciated.                                               Gwenith Daily Tyrone Sage, M.D.    Tyson Babinski  D:  08/03/2003  T:  08/04/2003  Job:  [id number]   cc:   Darlin Priestly, M.D.  [phone] [address]  [place]  [place] [address]  Fax: [phone]

## 2011-04-06 NOTE — Discharge Summary (Signed)
[name] [name], MEDDINGS                           ACCOUNT NO.:  [id number]   MEDICAL RECORD NO.:  [id number]                   PATIENT TYPE:  INP   LOCATION:  2030                                 FACILITY:  [facility]   PHYSICIAN:  [name] [name], M.D.             DATE OF BIRTH:  [birth year]   DATE OF ADMISSION:  09/17/2003  DATE OF DISCHARGE:  09/22/2003                                 DISCHARGE SUMMARY   ADMISSION DIAGNOSES:  1. Non-Q-wave myocardial infarction.  2. History of coronary artery disease.     A. Status post acute myocardial infarction August 01, 2003.     B. Status post emergency cardiac catheterization on August 01, 2003        revealing severe multi-vessel coronary artery disease and ejection        fraction 40%.     C. Status post coronary artery bypass grafting on August 02, 2003 to        five vessels by Dr. Tyrone [name].  3. Ongoing tobacco use.  4. History of syncope with anxiety attacks about 10 years ago.   DISCHARGE DIAGNOSES:  1. Non-Q-wave myocardial infarction.  2. History of coronary artery disease.     A. Status post acute myocardial infarction August 01, 2003.     B. Status post emergency cardiac catheterization on August 01, 2003        revealing severe multi-vessel coronary artery disease and ejection        fraction 40%.     C. Status post coronary artery bypass grafting on August 02, 2003 to        five vessels by Dr. Tyrone [name].  3. Ongoing tobacco use.  4. History of syncope with anxiety attacks about 10 years ago.  5. Status post cardiac catheterization on September 17, 2003 by Dr. Nicki [name]. He performed percutaneous transluminal coronary angioplasty and     stent x4 to the saphenous vein graft to the obtuse marginal vessel. There     was clot present. There was an occluded saphenous vein graft to obtuse     marginal. There was significant residual disease in the distal limb of     the right coronary artery graft. They planned  for staged intervention to     that on the upcoming Monday.  6. Status post staged intervention performed on September 20, 2003 by Dr. Caprice [name]. At the time of his procedure, the saphenous vein graft to the     right coronary artery graft was widely patent, and the area that did have     diminished flow now looked normal. It is noted the patient had been on IV     heparin and Integrilin since September 17, 2003, and it was felt that had     cleared the clot. He planned for continued medical therapy at this point  and did not require further intervention. During Dr. Fredirick [name] cardiac     catheterization that he also evaluated the saphenous vein graft to the     obtuse marginal which had just been intervened upon on September 17, 2003.     This revealed 100% occlusion in the proximal stent. On fluoroscopy, the     stent extended all the way down to what appeared to be the distribution     of the obtuse marginal but there was no flow.   HISTORY OF PRESENT ILLNESS:  Mr. [name] [name] is a 55 year old white male who had  no prior cardiac history until he presented on August 01, 2003 with an  acute MI. At that time, he underwent emergent cardiac catheterization on  August 01, 2003 by Dr. Lenise [name]. He was found to have severe multi-  vessel coronary disease and EF 40%. He continued Integrilin until he  underwent coronary artery bypass grafting. He underwent CABG on August 01, 2003 by Dr. Tyrone [name] with a LIMA to LAD, SVG to diagonal, SVG to RCA and  PDA, SVG to OM1. He subsequently was discharged home on August 05, 2003  after an uncomplicated postoperative course. He had seen Dr. Jenne [name] for a  followup office visit and was told that he was doing well and had been  feeling well at that point.   He continued to do well until the night prior to admission. At that time, he  had been sleeping and woke up about 2 a.m. with burning in the middle of his  chest, felt like a severe  indigestion pain. He sat up for about 30 minutes  but did not ease at all, so he took a nitroglycerin, and it did ease briefly  but then came back. He took a second nitroglycerin but then felt sick and  had vomiting. After vomiting, he felt a little better, went back and laid  back in bed; however, his chest burning continued. He eventually called his  daughter who told him to call EMS. He said that this is the exact same  burning sensation that he had with his MI. It is definitely different than  any chest wall pain he had had post CABG.   He had had no real shortness of breath but did have diaphoresis and  clamminess. As well, he had had nausea and vomiting. There was no radiation  of the discomfort. At this point, he had gotten four baby aspirins from EMS.  In the ER, he was started on IV nitroglycerin at 3. He was still complaining  of a burning in his chest but that it had improved somewhat with IV  nitroglycerin.   He did note that he was still smoking about five to six cigarettes per day  even since his MI.   At this time, he was stable with a blood pressure of 145/88, heart rate 53.  No significant abnormalities on physical exam. EKG shows sinus rhythm, some  mild up coving of the STs in leads II, III, aVF about 1 mm and about 2 mm T-  wave inversion in V3 through V4, but this was similar to the EKG post MI  from past hospitalization.   Chest x-ray shows no acute disease.   Initial labs came back with a CK-MB of 15.0, troponin 0.44.   At this point, he was seen and evaluated by Dr. Andree Coss. We planned  to admit him to the CCU, check further enzymes to follow for MI.  We treated  him with aspirin, heparin, nitroglycerin, and added Integrilin. Continued  beta blockers and statins. We planned for urgent cardiac catheterization.  Risks and benefits were discussed, and he is willing to proceed.  HOSPITAL COURSE:  On September 17, 2003, [name] underwent cardiac   catheterization by Dr. Nicki [name]. He was found to have significant clot  and 100% occlusion in the saphenous vein graft to the OM vessel. As well, he  was found to have clot with questionable dissection and a vein graft to the  PDA. At that point, Dr. Tresa Endo planned to intervene upon the saphenous vein  graft to the OM which was totally occluded and planned for staged  intervention to the graft to the PDA on the upcoming Monday. He ultimately  placed four stents to the saphenous vein graft to the OM. He continued  heparin and Integrilin post procedure until the staged intervention to be on  Monday. The patient tolerated the procedure well and had no significant  complications. See the dictated report as it quite lengthy and complicated.   On September 18, 2003, Mr. Malkiewicz was feeling well with no chest burning at  that point. At that time, he was stable, and we were monitoring and waiting  for further intervention on Monday.   On the night of September 18, 2003, however, at about 9 p.m., he was  complaining of several hours' worth of recurrent chest burning. Dr. Allyson Sabal  came in and evaluated him at about 9 p.m. at which time he was having 5/10  chest pain. At this point, he was continuing on IV heparin and IV Integrilin  as well as his other medications. He had been given IV morphine, was put on  a nitroglycerin drip which had been titrated upward. EKG was reviewed, and  there were no additional changes on his EKG compared to prior EKG. We  planned to check a further set of enzymes. It was felt that there were no  inferior changes on EKG to suggest RCA/SVG involvement. He would not take  back to the catheterization lab at that point unless there were initial EKG  changes. It was felt that if the pain was secondary to the OM saphenous vein  graft and he would doubtful for it to remain patent anyway.   On the morning of September 19, 2003, he was still having a slight amount of  burning in the  chest; however, it was better than it had been in the night.  We reviewed enzymes. On September 17, 2003, prior to the recurrent pain, the  MB had been 188.8. In the early morning of October 30, the MB had gone down  to 24.9 and troponin 3.73; however, on September 19, 2003 at 3:30 in the  morning, it had gone up to a MB of 30.8, troponin 4.45. We planned to  further follow enzymes. Planned to continue his heparin and Integrilin. EKG  still showed no changes, so we planned to continue current medications and  await further catheterization on Monday.   On September 20, 2003, Mr. Goodie underwent cardiac catheterization by Dr. Caprice [name]. Please see his dictated report for details. Significant findings  included the following:  He found that the stent to the occluded SVG to OM  was now 100% occluded at the proximal stent; however, the SVG to the RCA  graft now was widely patent. The area that did have diminished flow to the distal graft now looked like normal flow.  It was felt that the stenosis and  thrombus had improved as the patient had been on IV heparin and IV  Integrilin over the weekend; however, he did note that the previously  stented site is now occluded, and it was felt that this would not stay open,  so we did not do any further intervention there. It was planned to continue  medical therapy at this point as the patient was pain free at that point.  Please see his dictated report as it was quite lengthy.   On September 21, 2003, Mr. Guymon is doing well. He has had no further chest  burning or shortness of breath. His groin site is healing well. We plan to  ambulate him, monitor, and probable discharge in the morning if stable.   On the morning of September 22, 2003, Mr. Kauth is doing well. Blood pressure  is 120/78, heart rate is the 70s. He is maintaining sinus rhythm with no  arrhythmias. He is feeling great with no recurrent chest burning or  shortness of breath. His lungs are clear.  His heart is a regular rhythm  without murmur. Groin site shows minimal bruising but no hematoma or bleed.  At this point, he was seen and evaluated by Dr. Caprice [name] who deemed him  stable for discharge home.   HOSPITAL CONSULTS:  None.   HOSPITAL PROCEDURES:  1. Cardiac catheterization by Dr. Nicki [name] on September 17, 2003. The     patient was found to have thrombus with an occlusion in the saphenous     vein graft to the OM vessel. He proceed with PTCA stent x4 to the     saphenous vein graft to the OM. The patient was also noted to have clot     versus questionable dissection in the distal graft to the PDA. He planned     to continue heparin and Integrilin through the weekend and planned for     staged intervention on the upcoming Monday.  2. Staged cardiac catheterization performed on September 20, 2003 by Dr. Caprice [name]. At it turned out, the stents to the occluded SVG to OM placed at     catheterization on September 17, 2003 was now occluded in the proximal     stent site; however, the distal SVG to RCA area that did have decreased     flow is now improved. It is now widely patent as well and has good flow.     It is noted that the patient had been on IV heparin and Integrilin since     September 17, 2003. For further findings, see the dictated report. At this     point, the patient has been pain free for the last 16 hours. Dr. Clarene Duke     suspected that his saphenous vein graft to the OM closed on Saturday     night when he complained of recurrent chest burning. Since he was pain     free at this point and hemodynamically stable, he did not try to reopen     the multiple stents, nor tried to open the native circulation. There is     concern about trying to fix it via the native circulation as the distal     stent and the saphenous vein graft extended across the anastomotic site     and into the ongoing OM vessel, making intervention to the area less    likely to be successful. At  this  point, the patient will be managed     medically with medical therapy. He will stop the heparin, nitroglycerin,     Integrilin, and ambulate him and plan for discharge later in the week.     The patient tolerated the procedure well without complications.   LABORATORY DATA:  Cardiac enzymes showed first set on September 17, 2003  showed CK of 1,129, CK-MB 188.8, troponin 27.38. Next set on September 18, 2003 showed CK 237, MB 24.9, troponin 3.73. The next set on September 19, 2003  at 03:30 in the morning showed CK 232, MB 30.8, troponin 4.45. The following  sets performed later on September 19, 2003 showed CK 257, MB 38.3, troponin  5.14.   Initial labs show white count 7.2, hemoglobin 13.3, hematocrit 40.7,  platelets 192. Sodium 143, potassium 4.6, chloride 110, CO2 28, glucose 110,  BUN 9, creatinine 0.8. Liver function tests are stable. Magnesium 1.9.   Radiology:  Chest x-ray September 17, 2003 shows no acute disease.  EKG on  admission showed normal sinus rhythm about 1 mm of mild up coving of the STs  in leads II, III, aVF which is new but very slightly changed from prior EKG.  It also showed T-wave inversion of about 2 mm in V3 and V4, but this is  similar to his last EKG we had after his last MI.   DISCHARGE MEDICATIONS:  1. Plavix 75 mg once a day.  2. Aspirin 325 mg once a day.  3. Nexium 40 mg a day.  4. Lipitor 10 mg once a day.  5. Lopressor 25 mg twice a day.  6. Altace 2.5 mg once a day.  7. Wellbutrin 150 mg twice a day.   ACTIVITY:  No strenuous activity, lifting greater than 5 pounds, driving, or  sexual activity for 3 days.   DIET:  Low cholesterol diet.   WOUND CARE:  May gently wash the groin site with warm water and soap. Call  [phone] for any bleeding or necrosis of the groin site.   SPECIAL INSTRUCTIONS:  Stop smoking.   FOLLOW UP:  Followup with Dr. Jenne [name] December 2 at 11:30 a.m.      Mary B. Easley, P.A.-C.                   [name] [name],  M.D.    MBE/MEDQ  D:  09/22/2003  T:  09/23/2003  Job:  [id number]   cc:   Olene Craven, M.D.  [address]  [address]  [place]  [place] [address]  Fax: [phone]

## 2011-04-06 NOTE — Op Note (Signed)
   [name], [name] [name]                           ACCOUNT NO.:  [id number]   MEDICAL RECORD NO.:  [id number]                   PATIENT TYPE:  INP   LOCATION:  2007                                 FACILITY:  [facility]   PHYSICIAN:  Gwenith Daily. Tyrone Sage, M.D.            DATE OF BIRTH:  [birth year]   DATE OF PROCEDURE:  08/01/2003  DATE OF DISCHARGE:                                 OPERATIVE REPORT   PREOPERATIVE DIAGNOSIS:  Acute anterior myocardial infarction and 3-vessel  disease and question of a left lung nodule.   POSTOPERATIVE DIAGNOSIS:  Acute anterior myocardial infarction and 3-vessel  disease and question of a left lung nodule. No left lung nodule palpated.   PROCEDURE:  Coronary artery bypass grafting x5 with the left internal  mammary artery to the left anterior descending coronary artery,  sequential  reverse saphenous vein graft to the acute  marginal and posterior descending  coronary artery, reverse saphenous vein graft to the 1st diagonal coronary  artery, reverse saphenous vein graft to the 1st obtuse marginal coronary  artery with endovein harvesting, right leg.   SURGEON:  Gwenith Daily. Tyrone Sage, M.D.   FIRST ASSISTANT:  Rowe Clack, P.A.-C.   INDICATIONS FOR PROCEDURE:  The patient is a 55 year old male who presented  with stuttering pain for 18 hours. In the emergency room he had marked ST  elevation across the anterior leads. He was taken directly to the  catheterization laboratory.  Cardiac catheterization revealed high-grade  proximal  LAD lesion greater than 90% involving the  1st diagonal with 70%  disease in the circumflex supplying a large obtuse marginal, 99% acute  marginal and continuation of the right coronary graft  with a decreased  ejection fraction approximately  40%.   Because of the patient's evolving acute myocardial infarction and critical  anatomy, an emergency coronary artery bypass grafting was recommended. The  patient agreed and signed informed  consent.   DESCRIPTION OF PROCEDURE:  With Swann-Ganz and arterial line monitors in  place the patient underwent general endotracheal anesthesia without  incident. The skin of the chest and leg was prepped with Betadine and draped  in the usual sterile manner.   Using the [employer] Endovein harvesting system, vein was harvested from the  right thigh and looked of adequate quality and caliber. A median sternotomy  was performed. The left internal mammary artery was dissected down as a  pedicle graft. The distal  artery was found to have good free flow. The  pericardium was opened.   The patient had evidence of global hypokinesis and anterior hypokinesis.   Dictation ends here.      EBG/MEDQ  D:  08/03/2003  T:  08/03/2003  Job:  [id number]

## 2011-04-06 NOTE — Cardiovascular Report (Signed)
[name] [name], [name] [name]                           ACCOUNT NO.:  [id number]   MEDICAL RECORD NO.:  [id number]                   PATIENT TYPE:  INP   LOCATION:  1825                                 FACILITY:  [facility]   PHYSICIAN:  Darlin Priestly, M.D.             DATE OF BIRTH:  03/26/1956   DATE OF PROCEDURE:  08/01/2003  DATE OF DISCHARGE:                              CARDIAC CATHETERIZATION   PROCEDURES:  1. Left heart catheterization.  2. Coronary angiography.  3. Left ventriculogram.   ATTENDING PHYSICIAN:  Darlin Priestly, M.D.   COMPLICATIONS:  None.   INDICATIONS:  Mr. [name] [name] is a 55 year old male patient of Dr. Olene [name] with history of tobacco use and positive family history of coronary  artery disease who presents to the emergency room  on August 01, 2003 at  approximately 8 a.m. complaining of substernal chest pain which had waxed  and waned since 1 a.m.  At the time of his emergency room arrival, he was  noted to have acute anterior ST elevation.  He is now brought acutely to the  cardiac catheterization lab.   DESCRIPTION OF OPERATION:  After obtaining informed written consent, the  patient was brought to the cardiac catheterization laboratory.  Right and  left groin was shaved and prepped and draped in the usual sterile fashion.  ECG monitor was established.  Using the modified Seldinger technique, a 7  French arterial sheath was inserted in the right femoral artery. A 6 French  diagnostic catheter was then used to performed diagnostic angiography.  This  reveals a large left main, however, this is a short vessel.  The LAD is a  large vessel that coursed to the apex and gives rise to three diagonal  branches.  The LAD had a 95% ostial and proximal lesion extending into the  large first diagonal branch.  There is a mid 80% LAD lesion as well as a 60%  LAD lesion.  The first diagonal is a large vessel with a 90% ostial lesion.  The second and third  diagonals are small vessels with no significant  disease.   Left circumflex is a medium size vessel that courses in the AV groove and  goes to three obtuse marginal branches.  The AV groove circumflex has an 80%  mid vessel lesion.   The first obtuse marginal is a small vessel with no significant disease. The  second obtuse marginal is a large vessel that bifurcates distally and has a  70% proximal lesion.  The third OM is a small vessel with no significant  disease.   The right coronary is a large vessel which is dominant and gives rise to  both PDA as well as posterior lateral branch.  There is diffuse 90% distal  RCA disease involving the ostium of the PDA and posterior lateral branch.  There is a 95% ostial PLA and 95%  PDA lesion.   LEFT VENTRICULOGRAM:  Left ventriculogram reveals moderately depressed EF of  40% with anterior, anterolateral and apical akinesis.   HEMODYNAMICS:  Systemic arterial pressure 109/71, LV pressure 105/10, LVEDP  of 21.    CONCLUSIONS:  1. Significant three-vessel coronary artery disease.  2. Moderately depressed left ventricular systolic function.  3. Elevated LVEDP.                                                   Darlin Priestly, M.D.    RHM/MEDQ  D:  08/01/2003  T:  08/01/2003  Job:  [id number]   cc:   [name] [name], M.D.  [address]  [address]  [place]  [place] [address]  Fax: [phone]

## 2011-04-06 NOTE — Discharge Summary (Signed)
[name] [name], [name] [name]                           ACCOUNT NO.:  [id number]   MEDICAL RECORD NO.:  [id number]                   PATIENT TYPE:  INP   LOCATION:  2007                                 FACILITY:  [facility]   PHYSICIAN:  Gwenith Daily. Tyrone Sage, M.D.            DATE OF BIRTH:  02-19-1956   DATE OF ADMISSION:  08/01/2003  DATE OF DISCHARGE:                                 DISCHARGE SUMMARY   ANTICIPATED DATE OF DISCHARGE:  August 05, 2003   HISTORY OF THE PRESENT ILLNESS:  This is a 55 year old male with a history  of chest pain beginning approximately three weeks ago.  It would occur on  occasion.  He saw his primary care physician, Dr. Delanna Notice, who was planning  further cardiologic workup.  The initial episode lasted approximately 30  minutes and was associated with shortness of breath and nausea.  He would  have some additional episodes going up and down the stairs at which time he  would get chest pain, but this would resolve with rest.  On the morning of  admission the patient was awoken from sleep with significant chest pain with  radiation down both arms as well as shortness of breath and vomiting.  It  went away and he went back to sleep, but it recurred and he called his girl  friend to come over.  It, again, resolved, and he went back to sleep; and,  then a third time it represented and would not go away; and, he went to the  emergency room.  In the emergency room he had episodes of vomiting.  His EKG  showed significant ST elevations in his anterior leads.  He was given  initial unstable angina protocol including aspirin, nitroglycerin and  Lopressor.  He had an episodes of ventricular tachycardia for a brief time.  The patient was seen in cardiology consult by Dr. Jenne Campus and was felt to  require further evaluation and treatment including heparin and Integrelin  drips as well as prompt study in the cardiac catheterization lab.   PAST MEDICAL HISTORY:  1. No cardiac  history, except for the last three weeks as described.  2. Syncope with anxiety attacks approximately 10 years ago, but none     recently.  3. History of tobacco abuse.  4. History of alcohol use until two years ago; he stopped in December 2002.   ALLERGIES:  SULFA.   MEDICATIONS:  Outpatient medications include:  1. Advil p.r.n.  2. Nexium.   FAMILY HISTORY:  Please see the history and physical done at the time of  admission.   SOCIAL HISTORY:  Please see the history and physical done at the time of  admission.   REVIEW OF SYSTEMS:  Please see the history and physical done at the time of  admission.   PHYSICAL EXAMINATION:  Please see the history and physical done at the time  of admission.  HOSPITAL COURSE:  The patient was admitted as stated and started on the  unstable angina protocol with presumed acute anterior myocardial infarction.  He was taken, as stated, to the cardiac catheterization lab by Dr. Jenne Campus  where he was found to have significant three-vessel coronary artery disease  with moderately depressed left ventricular systolic function and elevated  left ventricular end diastolic pressure.  The patient was promptly consulted  on by cardiac surgery, specifically, Dr. Sheliah Plane, who evaluated the  patient as well as his studies and recommended emergent surgical  revascularization.   Procedure:  On August 01, 2003 the patient was taken to the cardiac  operating room where he underwent the following procedure.  Coronary artery  bypass grafting times five.  The following grafts were placed.  1. Left internal mammary artery to the LAD.  2. Saphenous vein graft to the diagonal.  3. Saphenous vein graft to the right coronary artery and posterior     descending in a sequential manner.  4. Saphenous vein graft to the obtuse marginal.   Crossclamp time was 70 minutes.  The patient tolerated the procedure well.  Of note, the patient did have a preoperative  x-ray, which showed a possible  nodule in the left lung.  This nodule was not palpated during the procedure  and Dr. Dennie Maizes plan was for a follow up chest x-ray in the postoperative  period.   Postoperative Hospital Course:  The patient has done quite well.  Initial  intensive care unit stay was unremarkable.  All routine lines, monitors and  chest drainage devices were discontinued in a routine manner.  The patient  was advanced in regards to his routine activities commensurate for level of  postoperative convalescence using cardiac rehabilitation phase 1 modalities.  Oxygen was weaned and he maintained good saturations on room air.  He has  maintained a normal sinus rhythm without significant cardiac dysrhythmias in  the postoperative period.  He has required aggressive pulmonary toilet, but  has responded well.  Laboratory values showed a moderate anemia with most  recent hemoglobin and hematocrit 8.8 and 26.3 respectively.  All incisions  are healing well without signs of infection.  He is tolerating diet and  activity; and, overall tentatively felt to be stable for discharge in the  morning of August 05, 2003 pending morning rounds reevaluation.   DISCHARGE MEDICATIONS:  Medications on discharge are as follow:  1. Aspirin 325 mg 1 daily.  2. Nexium 40 mg daily.  3. Lipitor 10 mg daily.  4. Lopressor 25 mg twice daily.  5. Altace 2.5 mg daily.  6. For pain Tylox 1-2 every four to six hours p.r.n.   DISCHARGE INSTRUCTIONS:  The patient will receive written instructions in  regards to medications, activities, diet, wound care, and follow up.   FOLLOW UP:  Follow up will include Dr. Tyrone Sage on Thursday, October 07th,  at 1:10 P.M. with a chest x-ray from the [facility].  Additionally, he will see Dr. Jenne Campus in two weeks.   DISCHARGE CONDITION:  Condition on discharge is stable and improving.  DISCHARGE DIAGNOSIS:  Acute anterior wall myocardial  infarction in the  setting of severe three-vessel coronary artery disease with moderately  reduce left ventricular function.   OTHER DIAGNOSES:  1. Remote history of syncope.  2. History of tobacco abuse.  3. Remote history of alcohol abuse.  4. Postoperative anemia.   ADDENDUM:  The patient's PA and lateral chest x-ray is currently pending  to  the chart.  This will be evaluated prior to discharge to see if the patient  needs any further evaluation of this possible chest nodule in the left lung.        Rowe Clack, P.A.-C.                    Gwenith Daily Tyrone Sage, M.D.    Sherryll Burger  D:  08/04/2003  T:  08/04/2003  Job:  [id number]   cc:   Darlin Priestly, M.D.  [address]  [place]  [place] [address]  Fax: [phone]   Olene Craven, M.D.  [address]  [address]  [place]  [place] [address]  Fax: [phone]

## 2011-05-07 ENCOUNTER — Telehealth: Payer: Self-pay | Admitting: *Deleted

## 2011-05-07 ENCOUNTER — Telehealth: Payer: Self-pay | Admitting: Internal Medicine

## 2011-05-07 NOTE — Telephone Encounter (Signed)
plavix at the front desk will call pt and let him  know

## 2011-05-07 NOTE — Telephone Encounter (Signed)
Pt aware Plavix is at office, he states to not give med to anyone but him, advised it would be at front desk, attached a note and discussed w/front desk that only the pt could pick up the med

## 2011-05-10 ENCOUNTER — Other Ambulatory Visit: Payer: Self-pay | Admitting: *Deleted

## 2011-05-11 MED ORDER — METOPROLOL TARTRATE 25 MG PO TABS: 25.0000 mg | ORAL_TABLET | Freq: Two times a day (BID) | ORAL | Status: DC

## 2011-10-01 ENCOUNTER — Telehealth: Payer: Self-pay | Admitting: Internal Medicine

## 2011-10-01 MED ORDER — CLOPIDOGREL BISULFATE 75 MG PO TABS: 75.0000 mg | ORAL_TABLET | Freq: Every day | ORAL | Status: DC

## 2011-10-01 NOTE — Telephone Encounter (Signed)
Pt wants refill of plavix called to [facility] in [place]

## 2011-10-27 ENCOUNTER — Other Ambulatory Visit: Payer: Self-pay | Admitting: Internal Medicine

## 2011-11-30 ENCOUNTER — Other Ambulatory Visit: Payer: Self-pay | Admitting: Internal Medicine

## 2011-12-03 ENCOUNTER — Other Ambulatory Visit: Payer: Self-pay

## 2011-12-03 MED ORDER — CLOPIDOGREL BISULFATE 75 MG PO TABS: 75.0000 mg | ORAL_TABLET | Freq: Every day | ORAL | Status: DC

## 2011-12-05 ENCOUNTER — Other Ambulatory Visit: Payer: Self-pay | Admitting: *Deleted

## 2011-12-05 MED ORDER — CLOPIDOGREL BISULFATE 75 MG PO TABS: 75.0000 mg | ORAL_TABLET | Freq: Every day | ORAL | Status: DC

## 2012-01-02 ENCOUNTER — Ambulatory Visit (INDEPENDENT_AMBULATORY_CARE_PROVIDER_SITE_OTHER): Payer: Self-pay | Admitting: Internal Medicine

## 2012-01-02 ENCOUNTER — Encounter: Payer: Self-pay | Admitting: Internal Medicine

## 2012-01-02 VITALS — BP 124/75 | HR 72 | Temp 97.0°F | Ht 68.0 in | Wt 177.7 lb

## 2012-01-02 DIAGNOSIS — I10 Essential (primary) hypertension: Secondary | ICD-10-CM

## 2012-01-02 DIAGNOSIS — F32A Depression, unspecified: Secondary | ICD-10-CM | POA: Insufficient documentation

## 2012-01-02 DIAGNOSIS — F329 Major depressive disorder, single episode, unspecified: Secondary | ICD-10-CM

## 2012-01-02 DIAGNOSIS — M62838 Other muscle spasm: Secondary | ICD-10-CM

## 2012-01-02 DIAGNOSIS — I2510 Atherosclerotic heart disease of native coronary artery without angina pectoris: Secondary | ICD-10-CM

## 2012-01-02 DIAGNOSIS — E785 Hyperlipidemia, unspecified: Secondary | ICD-10-CM

## 2012-01-02 LAB — CBC
MCHC: 33.5 g/dL (ref 30.0–36.0)
Platelets: 185 10*3/uL (ref 150–400)
RDW: 13.3 % (ref 11.5–15.5)
WBC: 6 10*3/uL (ref 4.0–10.5)

## 2012-01-02 LAB — COMPREHENSIVE METABOLIC PANEL
ALT: 32 U/L (ref 0–53)
AST: 60 U/L — ABNORMAL HIGH (ref 0–37)
Albumin: 4.3 g/dL (ref 3.5–5.2)
Alkaline Phosphatase: 87 U/L (ref 39–117)
BUN: 17 mg/dL (ref 6–23)
Chloride: 111 mEq/L (ref 96–112)
Potassium: 4.3 mEq/L (ref 3.5–5.3)
Sodium: 143 mEq/L (ref 135–145)
Total Protein: 6.7 g/dL (ref 6.0–8.3)

## 2012-01-02 LAB — LIPID PANEL
LDL Cholesterol: 72 mg/dL (ref 0–99)
Triglycerides: 121 mg/dL (ref <150)
VLDL: 24 mg/dL (ref 0–40)

## 2012-01-02 MED ORDER — CITALOPRAM HYDROBROMIDE 10 MG PO TABS: 10.0000 mg | ORAL_TABLET | Freq: Every day | ORAL | Status: DC

## 2012-01-02 MED ORDER — CLOPIDOGREL BISULFATE 75 MG PO TABS: 75.0000 mg | ORAL_TABLET | Freq: Every day | ORAL | Status: DC

## 2012-01-02 MED ORDER — LISINOPRIL 2.5 MG PO TABS: 2.5000 mg | ORAL_TABLET | Freq: Every day | ORAL | Status: DC

## 2012-01-02 MED ORDER — SIMVASTATIN 40 MG PO TABS: 40.0000 mg | ORAL_TABLET | Freq: Every day | ORAL | Status: DC

## 2012-01-02 MED ORDER — METOPROLOL TARTRATE 25 MG PO TABS: 25.0000 mg | ORAL_TABLET | Freq: Two times a day (BID) | ORAL | Status: DC

## 2012-01-02 NOTE — Assessment & Plan Note (Signed)
Controlled. Continue lisnopril and lopressor Check chemistry

## 2012-01-02 NOTE — Assessment & Plan Note (Signed)
He has been feeling tired, loss of interest in hobbies, unable to focus, insomnia, and hopelessness from life  No SI or HI Has been having these symptoms since CABG in 2004 He was started on all the medicines that he is currently on after the CABG including BB Will defer cardiology to address BB dosage/replacement as this may be contributing to his depression He was put on zoloft initially by his heart doctor after CABG but he stopped taking it after 1 month as he though he would get hooked on it. It did work for him butnhe was having some abdominal discomfort from it His wife is on celexa and he is interested to try it I will check chem, CBC, TSH and start low dose celexa. I talked to him for 15 minutes about the expectations after starting this medicines. Also ask him to d/w cardiologist about metoprolol Follow up in 1 month. Increase dose if not improvement.

## 2012-01-02 NOTE — Assessment & Plan Note (Signed)
On calcium OTC which his father suggested he should take for spasms. He thinks this probably helped him. Given his heart condiution and some bony knots felt in his fingers, I would ask him to stop taking it. Will Rx flexeril or magnesium if spasms worsen.

## 2012-01-02 NOTE — Assessment & Plan Note (Signed)
Check lipid panel.  Continue statin. 

## 2012-01-02 NOTE — Patient Instructions (Signed)
Follow up in 1 month  Follow up with your cardiologist for you shortness of breath

## 2012-01-02 NOTE — Assessment & Plan Note (Signed)
CABG 2004, PTCA 2011. Follows [facility] Has not been there in a while because of financial problems Will be able to go now that he has the [facility] Will advise him to follow up soon as he is having exertional dyspnea and will need workup with echo and may need a diuretic. I will defer the workup to cariology as they may want some other tests.

## 2012-01-02 NOTE — Progress Notes (Signed)
Patient ID: Frank [name], male   DOB: 1956/01/13, 56 y.o.   MRN: [id number]   56 y/o m with CAD, HTN, HLD, GERD comes for multiple complaints 1. Feeling fatigued and depressed - see A/p for details 2. Exertional dyspnea without chest pain for past few months. Worsening. No orthopena or PND. See a/p for details. 3. Swelling of hands and legs since past year, gradually worsening,. Occasional pain, felt some knots in his fingers.  4. Insomnia for past few months, not sure why he cannot sleep well. No SOB or pain or stressors in life causing the problems 5. Medication refill, handicap placard,.   Physical exam   General Appearance:     Filed Vitals:   01/02/12 0929  BP: 124/75  Pulse: 72  Temp: 97 F (36.1 C)  TempSrc: Oral  Height: 5\' 8"  (1.727 m)  Weight: 177 lb 11.2 oz (80.604 kg)  SpO2: 97%     Alert, cooperative, no distress, appears stated age  Head:    Normocephalic, without obvious abnormality, atraumatic  Eyes:    PERRL, conjunctiva/corneas clear, EOM's intact, fundi    benign, both eyes       Neck:   Supple, symmetrical, trachea midline, no adenopathy;       thyroid:  No enlargement/tenderness/nodules; no carotid   bruit or JVD  Lungs:     Bilaterally faint crackles on lung bases, respirations unlabored  Chest wall:    No tenderness or deformity  Heart:    Regular rate and rhythm, S1 and S2 normal, no murmur, rub   or gallop  Abdomen:     Soft, non-tender, bowel sounds active all four quadrants,    no masses, no organomegaly  Extremities:   1+ edema b/l, atraumatic, no cyanosis, swelling in hands with bony protuberance on metacarpophalangeal joints, no tenderness or reduction in ROM  Pulses:   2+ and symmetric all extremities  Skin:   Skin color, texture, turgor normal, no rashes or lesions  Neurologic:  nonfocal grossly   ROS- as per HPI

## 2012-01-17 ENCOUNTER — Encounter: Payer: Self-pay | Admitting: Cardiovascular Disease

## 2012-01-17 ENCOUNTER — Ambulatory Visit (INDEPENDENT_AMBULATORY_CARE_PROVIDER_SITE_OTHER): Payer: Self-pay | Admitting: Cardiovascular Disease

## 2012-01-17 VITALS — BP 141/87 | HR 50 | Ht 68.0 in | Wt 175.8 lb

## 2012-01-17 DIAGNOSIS — R011 Cardiac murmur, unspecified: Secondary | ICD-10-CM

## 2012-01-17 DIAGNOSIS — I10 Essential (primary) hypertension: Secondary | ICD-10-CM

## 2012-01-17 DIAGNOSIS — I2510 Atherosclerotic heart disease of native coronary artery without angina pectoris: Secondary | ICD-10-CM

## 2012-01-17 MED ORDER — POTASSIUM CHLORIDE CRYS ER 10 MEQ PO TBCR: 10.0000 meq | EXTENDED_RELEASE_TABLET | Freq: Every day | ORAL | Status: DC

## 2012-01-17 MED ORDER — FUROSEMIDE 20 MG PO TABS: 20.0000 mg | ORAL_TABLET | Freq: Every day | ORAL | Status: DC

## 2012-01-17 NOTE — Progress Notes (Signed)
Frank [name] Date of Birth  1956-09-29 [facility] Office  [address]    [address]   [address] [place], [place]  [address]    [place], [place]  [address] [phone]  Fax  [phone]  [phone]  Fax [phone]  Problems 1. CAD -  CABG 2004, stenting by Dr. Excell Seltzer 2011 2. Hypertension 3.   History of Present Illness:  Holt has a hx of CAD.  He presents today with significant edema of his legs. Also is extremely fatigued.  He's been fatigued for the past year or so.  He's also been having some burning and tingling in his hands and his legs.  He exercises on occasion. He is short of breath when he walks uphill.  He eats an unrestricted diet. He admits that he eats a lot of salt.  Current Outpatient Prescriptions on File Prior to Visit  Medication Sig Dispense Refill  . aspirin 325 MG tablet Take 325 mg by mouth daily.        . calcium carbonate (TUMS) 500 MG chewable tablet Chew 1 tablet by mouth daily.        . citalopram (CELEXA) 10 MG tablet Take 1 tablet (10 mg total) by mouth daily.  31 tablet  0  . clopidogrel (PLAVIX) 75 MG tablet Take 1 tablet (75 mg total) by mouth daily.  90 tablet  4  . lisinopril (PRINIVIL,ZESTRIL) 2.5 MG tablet Take 1 tablet (2.5 mg total) by mouth daily.  90 tablet  4  . metoprolol tartrate (LOPRESSOR) 25 MG tablet Take 1 tablet (25 mg total) by mouth 2 (two) times daily.  180 tablet  4  . omeprazole (PRILOSEC) 20 MG capsule Take 20 mg by mouth daily.        . simvastatin (ZOCOR) 40 MG tablet Take 1 tablet (40 mg total) by mouth at bedtime.  90 tablet  4    Allergies  Allergen Reactions  . Sulfur     REACTION: rash    Past Medical History  Diagnosis Date  . Mitral regurgitation     by echo 05/11  . Hyperlipidemia   . GERD (gastroesophageal reflux disease)   . Hypertension     Past Surgical History  Procedure Date  . Coronary angioplasty with stent placement 05/11    stent to RCA x 1 and  SVG-acute marginal x2. EF normal  . Coronary artery bypass graft 2004    x4    History  Smoking status  . Former Smoker  . Quit date: 01/23/2003  Smokeless tobacco  . Not on file    History  Alcohol Use No    Family History  Problem Relation Age of Onset  . Heart disease Mother   . Cancer Mother   . Diabetes Father   . Hyperlipidemia Father   . Hypertension Father     Reviw of Systems:  Reviewed in the HPI.  All other systems are negative.  Physical Exam: Blood pressure 141/87, pulse 50, height 5\' 8"  (1.727 m), weight 175 lb 12.8 oz (79.742 kg), SpO2 95.00%. General: Well developed, well nourished, in no acute distress.  Head: Normocephalic, atraumatic, sclera non-icteric, mucus membranes are moist,   Neck: Supple. Carotids are 2 + without bruits. No JVD  Lungs: Clear bilaterally to auscultation.  Heart: regular rate.  There is a descrendo murmur at the left sternal border.  Abdomen: Soft, non-tender, non-distended with normal bowel sounds. No hepatomegaly. No  rebound/guarding. No masses.  Msk:  Strength and tone are normal  Extremities: No clubbing or cyanosis. No edema.  Distal pedal pulses are 2+ and equal bilaterally.  Neuro: Alert and oriented X 3. Moves all extremities spontaneously.  Psych:  Responds to questions appropriately with a normal affect.  ECG: Sinus bradycardia with a first-degree AV block. Otherwise the EKG is normal.  Assessment / Plan:

## 2012-01-17 NOTE — Patient Instructions (Signed)
Your physician recommends that you schedule a follow-up appointment in: 3 months with non fasting labs   Your physician has requested that you have an echocardiogram. Echocardiography is a painless test that uses sound waves to create images of your heart. It provides your doctor with information about the size and shape of your heart and how well your heart's chambers and valves are working. This procedure takes approximately one hour. There are no restrictions for this procedure.   Your physician has recommended you make the following change in your medication:   Start lasix 20 mg each morning Start potassium 10 meq each morning

## 2012-01-17 NOTE — Assessment & Plan Note (Signed)
Frank [name] has history of coronary artery disease and is status post bypass grafting in 2004. His last catheterization was in 2011 during which time he had several stents placed.  He's not been back to the office at that time because he has not had any insurance.  He eats very poorly. He does not exercise at all.  I tried to discuss the importance of sticking to a good diet and exercise regimen.  He has mild mitral regurgitation on exam. We'll start him on Lasix 20 mg a day as well as potassium chloride 10 mEq a day. We'll check an echocardiogram for further assessment of his left ventricular systolic function. I'll see him back in the office in several months.

## 2012-01-30 ENCOUNTER — Encounter: Payer: Self-pay | Admitting: Internal Medicine

## 2012-01-31 ENCOUNTER — Ambulatory Visit (INDEPENDENT_AMBULATORY_CARE_PROVIDER_SITE_OTHER): Payer: Self-pay | Admitting: Internal Medicine

## 2012-01-31 ENCOUNTER — Other Ambulatory Visit: Payer: Self-pay

## 2012-01-31 ENCOUNTER — Encounter: Payer: Self-pay | Admitting: Internal Medicine

## 2012-01-31 ENCOUNTER — Ambulatory Visit (HOSPITAL_COMMUNITY): Payer: Self-pay | Attending: Cardiology

## 2012-01-31 VITALS — BP 126/79 | HR 64 | Temp 96.4°F | Ht 68.0 in | Wt 179.2 lb

## 2012-01-31 DIAGNOSIS — I10 Essential (primary) hypertension: Secondary | ICD-10-CM

## 2012-01-31 DIAGNOSIS — R011 Cardiac murmur, unspecified: Secondary | ICD-10-CM

## 2012-01-31 DIAGNOSIS — F32A Depression, unspecified: Secondary | ICD-10-CM

## 2012-01-31 DIAGNOSIS — F329 Major depressive disorder, single episode, unspecified: Secondary | ICD-10-CM

## 2012-01-31 DIAGNOSIS — I079 Rheumatic tricuspid valve disease, unspecified: Secondary | ICD-10-CM | POA: Insufficient documentation

## 2012-01-31 DIAGNOSIS — I2510 Atherosclerotic heart disease of native coronary artery without angina pectoris: Secondary | ICD-10-CM | POA: Insufficient documentation

## 2012-01-31 DIAGNOSIS — I059 Rheumatic mitral valve disease, unspecified: Secondary | ICD-10-CM | POA: Insufficient documentation

## 2012-01-31 DIAGNOSIS — I080 Rheumatic disorders of both mitral and aortic valves: Secondary | ICD-10-CM

## 2012-01-31 LAB — BASIC METABOLIC PANEL
Chloride: 107 mEq/L (ref 96–112)
Creat: 0.99 mg/dL (ref 0.50–1.35)
Sodium: 142 mEq/L (ref 135–145)

## 2012-01-31 MED ORDER — CITALOPRAM HYDROBROMIDE 20 MG PO TABS: 20.0000 mg | ORAL_TABLET | Freq: Every day | ORAL | Status: DC

## 2012-01-31 MED ORDER — HYDRALAZINE HCL 10 MG PO TABS: 10.0000 mg | ORAL_TABLET | Freq: Three times a day (TID) | ORAL | Status: DC

## 2012-01-31 NOTE — Assessment & Plan Note (Signed)
Patient's symptoms are likely related to worsening mitral regurgitation. Patients undergoing 2-D echo today. Based on the results patient may benefit from mitral valve repair/replacement, it is recommended to have mitral valve surgery in symptomatic patients with severe chronic MR if the LVEF is ?30 percent and the LV end-systolic dimension is ?55 mm. For now vasodilator therapy with agents such as hydralazine in patients with chronic MR decreases left ventricular end-diastolic pressure and volume while increasing forward stroke volume and cardiac index, therefore improving symptoms. I will place the patient on hydralazine today. We'll also check a basic metabolic panel given that the patient is on a diuretic.

## 2012-01-31 NOTE — Progress Notes (Signed)
Apresoline rx called to [facility].

## 2012-01-31 NOTE — Progress Notes (Signed)
Subjective:     Patient ID: Frank [name], male   DOB: 06/18/1956, 56 y.o.   MRN: [id number]  HPI Patient is a 56 year old male, with a past medical history listed below, presented initially last month to the outpatient clinic with complaints of worsening fatigue and shortness of breath, at that time a referral to cardiology was made, where was noted the patient has mitral regurgitation, a 2-D echo was ordered which has not yet been done to date, upon chart review the patient had an echo in 2011 which showed moderate mitral regurg. Patient reports compliance with his medications, denies any chest pain, fever, nausea, vomiting or any other complaints.   Review of Systems Negative except per history of present illness    Objective:   Physical Exam Nursing notes and vitals reviewed General:  alert, well-developed, and cooperative to examination.   Lungs:  normal respiratory effort, no accessory muscle use, normal breath sounds, no crackles, and no wheezes. Heart:  normal rate, regular rhythm, 2/6 midsystolic murmur heard at the apex and radiating to the axilla, no gallop, and no rub.   Abdomen:  soft, non-tender, normal bowel sounds, no distention, no guarding, no rebound tenderness, no hepatomegaly, and no splenomegaly.   Extremities:  No cyanosis, clubbing, edema Neurologic:  alert & oriented X3, nonfocal exam  Meds: Medications Prior to Admission  Medication Sig Dispense Refill  . aspirin 325 MG tablet Take 325 mg by mouth daily.        . calcium carbonate (TUMS) 500 MG chewable tablet Chew 1 tablet by mouth daily.        . citalopram (CELEXA) 10 MG tablet Take 1 tablet (10 mg total) by mouth daily.  31 tablet  0  . clopidogrel (PLAVIX) 75 MG tablet Take 1 tablet (75 mg total) by mouth daily.  90 tablet  4  . furosemide (LASIX) 20 MG tablet Take 1 tablet (20 mg total) by mouth daily.  30 tablet  5  . lisinopril (PRINIVIL,ZESTRIL) 2.5 MG tablet Take 1 tablet (2.5 mg total) by mouth daily.   90 tablet  4  . metoprolol tartrate (LOPRESSOR) 25 MG tablet Take 1 tablet (25 mg total) by mouth 2 (two) times daily.  180 tablet  4  . omeprazole (PRILOSEC) 20 MG capsule Take 20 mg by mouth daily.        . potassium chloride (K-DUR,KLOR-CON) 10 MEQ tablet Take 1 tablet (10 mEq total) by mouth daily.  30 tablet  5  . simvastatin (ZOCOR) 40 MG tablet Take 1 tablet (40 mg total) by mouth at bedtime.  90 tablet  4   No current facility-administered medications on file as of 01/31/2012.    Allergies: Sulfur Past Medical History  Diagnosis Date  . Mitral regurgitation     by echo 05/11  . Hyperlipidemia   . GERD (gastroesophageal reflux disease)   . Hypertension    Past Surgical History  Procedure Date  . Coronary angioplasty with stent placement 05/11    stent to RCA x 1 and SVG-acute marginal x2. EF normal  . Coronary artery bypass graft 2004    x4   Family History  Problem Relation Age of Onset  . Heart disease Mother   . Cancer Mother   . Diabetes Father   . Hyperlipidemia Father   . Hypertension Father    History   Social History  . Marital Status: Legally Separated    Spouse Name: N/A    Number of Children: N/A  .  Years of Education: N/A   Occupational History  . Not on file.   Social History Main Topics  . Smoking status: Former Smoker    Quit date: 01/23/2003  . Smokeless tobacco: Not on file  . Alcohol Use: No  . Drug Use: No  . Sexually Active: Yes   Other Topics Concern  . Not on file   Social History Narrative  . No narrative on file

## 2012-01-31 NOTE — Patient Instructions (Signed)
A prescription for hydralazine has been sent to her pharmacy, this is a vasodilator which will help reduce the stress on your heart and hopefully will improve your symptoms, a prescription for Celexa has also been sent to pharmacy.

## 2012-01-31 NOTE — Progress Notes (Signed)
Celexa rx called to [facility] pharmacy.

## 2012-03-12 ENCOUNTER — Other Ambulatory Visit: Payer: Self-pay | Admitting: Internal Medicine

## 2012-03-20 ENCOUNTER — Encounter: Payer: Self-pay | Admitting: *Deleted

## 2012-03-20 ENCOUNTER — Ambulatory Visit (INDEPENDENT_AMBULATORY_CARE_PROVIDER_SITE_OTHER): Payer: Self-pay | Admitting: Cardiovascular Disease

## 2012-03-20 ENCOUNTER — Encounter: Payer: Self-pay | Admitting: Cardiovascular Disease

## 2012-03-20 VITALS — BP 110/70 | HR 60 | Ht 68.0 in | Wt 177.0 lb

## 2012-03-20 DIAGNOSIS — I2510 Atherosclerotic heart disease of native coronary artery without angina pectoris: Secondary | ICD-10-CM

## 2012-03-20 DIAGNOSIS — R0600 Dyspnea, unspecified: Secondary | ICD-10-CM | POA: Insufficient documentation

## 2012-03-20 DIAGNOSIS — R0609 Other forms of dyspnea: Secondary | ICD-10-CM

## 2012-03-20 DIAGNOSIS — R0989 Other specified symptoms and signs involving the circulatory and respiratory systems: Secondary | ICD-10-CM

## 2012-03-20 DIAGNOSIS — I080 Rheumatic disorders of both mitral and aortic valves: Secondary | ICD-10-CM

## 2012-03-20 NOTE — Patient Instructions (Signed)
Your physician recommends that you schedule a follow-up appointment in: 2 weeks.   Your physician has requested that you have a TEE. During a TEE, sound waves are used to create images of your heart. It provides your doctor with information about the size and shape of your heart and how well your heart's chambers and valves are working. In this test, a transducer is attached to the end of a flexible tube that's guided down your throat and into your esophagus (the tube leading from you mouth to your stomach) to get a more detailed image of your heart. You are not awake for the procedure. Please see the instruction sheet given to you today. For further information please visit [email or web address]  Scheduled for Mar 27, 2012

## 2012-03-20 NOTE — Assessment & Plan Note (Signed)
May be related to his valvular disease, CAD or COPD. If cardiac workup is stable, will send for PFTs.

## 2012-03-20 NOTE — Progress Notes (Signed)
History of Present Illness: 56 yo male with a history of CAD s/p CABG 2004 and stenting in 2011, HLD, HTN, GERD and mitral regurgitation who is here today for cardiac follow up. I have not met him before. He has been followed by Dr. Elease Hashimoto but has been non-compliant with f/u appointments per the record. He was last seen in our office 01/17/12 by Dr. Elease Hashimoto. He is here today to get a second opinion. Echo 01/31/12 with normal LV size and function with at least moderate MR.   He tells me today that he feels fatigued and has SOB with minimal exertion. No LE edema. No orthopnea. NO chest pain.   Primary Care Physician: Dr. Charlett Lango  Last Lipid Profile:  Lipid Panel     Component Value Date/Time   CHOL 136 01/02/2012 1049   TRIG 121 01/02/2012 1049   HDL 40 01/02/2012 1049   CHOLHDL 3.4 01/02/2012 1049   VLDL 24 01/02/2012 1049   LDLCALC 72 01/02/2012 1049     Past Medical History  Diagnosis Date  . Mitral regurgitation     by echo 05/11  . Hyperlipidemia   . GERD (gastroesophageal reflux disease)   . Hypertension   . CAD (coronary artery disease)     5V CABG in 2004    Past Surgical History  Procedure Date  . Coronary angioplasty with stent placement 05/11    stent to RCA x 1 and SVG-acute marginal x2. EF normal  . Coronary artery bypass graft 2004    x4    Current Outpatient Prescriptions  Medication Sig Dispense Refill  . aspirin 325 MG tablet Take 325 mg by mouth daily.        . calcium carbonate (TUMS) 500 MG chewable tablet Chew 1 tablet by mouth daily.        . citalopram (CELEXA) 20 MG tablet Take 1 tablet (20 mg total) by mouth daily.  31 tablet  3  . clopidogrel (PLAVIX) 75 MG tablet Take 1 tablet (75 mg total) by mouth daily.  90 tablet  4  . furosemide (LASIX) 20 MG tablet Take 1 tablet (20 mg total) by mouth daily.  30 tablet  5  . hydrALAZINE (APRESOLINE) 10 MG tablet TAKE 1 TABLET BY MOUTH THREE TIMES DAILY  90 tablet  2  . lisinopril (PRINIVIL,ZESTRIL) 2.5 MG  tablet Take 1 tablet (2.5 mg total) by mouth daily.  90 tablet  4  . metoprolol tartrate (LOPRESSOR) 25 MG tablet Take 1 tablet (25 mg total) by mouth 2 (two) times daily.  180 tablet  4  . omeprazole (PRILOSEC) 20 MG capsule Take 20 mg by mouth daily.        . potassium chloride (K-DUR,KLOR-CON) 10 MEQ tablet Take 1 tablet (10 mEq total) by mouth daily.  30 tablet  5  . simvastatin (ZOCOR) 40 MG tablet Take 1 tablet (40 mg total) by mouth at bedtime.  90 tablet  4    Allergies  Allergen Reactions  . Sulfur     REACTION: rash    History   Social History  . Marital Status: Legally Separated    Spouse Name: N/A    Number of Children: 2  . Years of Education: N/A   Occupational History  . 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 Smoking status: Former Smoker -- 3.0 packs/day for 40 years    Types: Cigarettes    Quit date: 01/23/2003  . Smokeless tobacco: Not on  file  . Alcohol Use: No  . Drug Use: No  . Sexually Active: Yes   Other Topics Concern  . Not on file   Social History Narrative  . No narrative on file    Family History  Problem Relation Age of Onset  . Heart disease Mother   . Cancer Mother     ? type  . Diabetes Father   . Hyperlipidemia Father   . Hypertension Father     Review of Systems:  As stated in the HPI and otherwise negative.   BP 110/70  Pulse 60  Ht 5\' 8"  (1.727 m)  Wt 177 lb (80.287 kg)  BMI 26.91 kg/m2  Physical Examination: General: Well developed, well nourished, NAD HEENT: OP clear, mucus membranes moist SKIN: warm, dry. No rashes. Neuro: No focal deficits Musculoskeletal: Muscle strength 5/5 all ext Psychiatric: Mood and affect normal Neck: No JVD, no carotid bruits, no thyromegaly, no lymphadenopathy. Lungs:Clear bilaterally, no wheezes, rhonci, crackles Cardiovascular: Regular rate and rhythm. Systolic murmur. No gallops or rubs. Abdomen:Soft. Bowel sounds present. Non-tender.  Extremities: No lower extremity  edema. Pulses are 2 + in the bilateral DP/PT.  Echo: 01/31/12:  Left ventricle: The cavity size was normal. Wall thickness was normal. Systolic function was normal. The estimated ejection fraction was in the range of 55% to 65%. Wall motion was normal; there were no regional wall motion abnormalities. Left ventricular diastolic function parameters were normal. - Mitral valve: Moderate regurgitation. - Left atrium: The atrium was mildly dilated. Impressions:  - Patient with eccentric MR difficult to quantitate but most likely moderate; Etiology of MR unclear; suggest TEE to evaluate MR and valve morphology if clinically indicated.

## 2012-03-20 NOTE — Assessment & Plan Note (Signed)
Stable. No chest pain. Will continue current therapy. If mitral regurgitation is not significant on TEE, will consider arranging stress test to exclude ischemia.

## 2012-03-20 NOTE — Progress Notes (Signed)
Addended by: Vipul Cafarelli D on: 03/20/2012 10:09 AM   Modules accepted: Orders  

## 2012-03-20 NOTE — Assessment & Plan Note (Addendum)
Echo with moderate MR but pt with fatigue and SOB. I am not sure this is related to his valvular disease but echo report suggests TEE to further define his anatomy. Will arrange TEE with Dr. Shirlee Latch on 03/27/12. Continue afterload reduction with hydralazine.

## 2012-03-27 ENCOUNTER — Ambulatory Visit (HOSPITAL_COMMUNITY)
Admission: RE | Admit: 2012-03-27 | Discharge: 2012-03-27 | Disposition: A | Discharge status: Home / Self Care | Payer: Self-pay | Source: Ambulatory Visit | Attending: Cardiology | Admitting: Cardiology

## 2012-03-27 ENCOUNTER — Encounter (HOSPITAL_COMMUNITY)
Admission: RE | Disposition: A | Discharge status: Home / Self Care | Payer: Self-pay | Source: Ambulatory Visit | Attending: Cardiology

## 2012-03-27 ENCOUNTER — Encounter (HOSPITAL_COMMUNITY): Payer: Self-pay | Admitting: *Deleted

## 2012-03-27 DIAGNOSIS — I059 Rheumatic mitral valve disease, unspecified: Secondary | ICD-10-CM | POA: Insufficient documentation

## 2012-03-27 DIAGNOSIS — I080 Rheumatic disorders of both mitral and aortic valves: Secondary | ICD-10-CM

## 2012-03-27 HISTORY — DX: Depression, unspecified: F32.A

## 2012-03-27 HISTORY — DX: Major depressive disorder, single episode, unspecified: F32.9

## 2012-03-27 HISTORY — PX: TEE WITHOUT CARDIOVERSION: SHX5443

## 2012-03-27 HISTORY — DX: Heart failure, unspecified: I50.9

## 2012-03-27 HISTORY — DX: Unspecified osteoarthritis, unspecified site: M19.90

## 2012-03-27 HISTORY — DX: Acute myocardial infarction, unspecified: I21.9

## 2012-03-27 SURGERY — ECHOCARDIOGRAM, TRANSESOPHAGEAL
Anesthesia: Moderate Sedation

## 2012-03-27 MED ORDER — SODIUM CHLORIDE 0.9 % IJ SOLN: 3.0000 mL | INTRAMUSCULAR | Status: DC | PRN

## 2012-03-27 MED ORDER — SODIUM CHLORIDE 0.9 % IV SOLN: 250.0000 mL | INTRAVENOUS | Status: DC | PRN

## 2012-03-27 MED ORDER — FENTANYL CITRATE 0.05 MG/ML IJ SOLN
INTRAMUSCULAR | Status: AC
  Filled 2012-03-27: qty 2

## 2012-03-27 MED ORDER — MIDAZOLAM HCL 10 MG/2ML IJ SOLN
INTRAMUSCULAR | Status: AC
  Filled 2012-03-27: qty 2

## 2012-03-27 MED ORDER — BENZOCAINE 20 % MT SOLN: 1.0000 "application " | OROMUCOSAL | Status: DC | PRN

## 2012-03-27 MED ORDER — FENTANYL CITRATE 0.05 MG/ML IJ SOLN: 250.0000 ug | Freq: Once | INTRAMUSCULAR | Status: DC

## 2012-03-27 MED ORDER — DIPHENHYDRAMINE HCL 50 MG/ML IJ SOLN
INTRAMUSCULAR | Status: AC
  Filled 2012-03-27: qty 1

## 2012-03-27 MED ORDER — SODIUM CHLORIDE 0.9 % IJ SOLN: 3.0000 mL | Freq: Two times a day (BID) | INTRAMUSCULAR | Status: DC

## 2012-03-27 MED ORDER — SODIUM CHLORIDE 0.9 % IV SOLN
250.0000 mL | INTRAVENOUS | Status: DC | PRN
Start: 2012-03-27 — End: 2012-03-27

## 2012-03-27 MED ORDER — SODIUM CHLORIDE 0.45 % IV SOLN
INTRAVENOUS | Status: DC
  Administered 2012-03-27: 500 mL via INTRAVENOUS

## 2012-03-27 MED ORDER — FENTANYL CITRATE 0.05 MG/ML IJ SOLN
INTRAMUSCULAR | Status: DC | PRN
  Administered 2012-03-27 (×2): 25 ug via INTRAVENOUS

## 2012-03-27 MED ORDER — MIDAZOLAM HCL 10 MG/2ML IJ SOLN
INTRAMUSCULAR | Status: DC | PRN
  Administered 2012-03-27 (×3): 2 mg via INTRAVENOUS

## 2012-03-27 MED ORDER — MIDAZOLAM HCL 10 MG/2ML IJ SOLN: 10.0000 mg | Freq: Once | INTRAMUSCULAR | Status: DC

## 2012-03-27 MED ORDER — BUTAMBEN-TETRACAINE-BENZOCAINE 2-2-14 % EX AERO
INHALATION_SPRAY | CUTANEOUS | Status: DC | PRN
  Administered 2012-03-27: 2 via TOPICAL

## 2012-03-27 MED ORDER — SODIUM CHLORIDE 0.45 % IV SOLN: INTRAVENOUS | Status: DC

## 2012-03-27 NOTE — CV Procedure (Signed)
Procedure: TEE  Indication: mitral regurgitation  Sedation: Versed 6 mg IV, Fentanyl 50 mcg IV  Findings:  See echo section for full report.  EF 55% with normal LV size.  There was mild to moderate very eccentric posteriorly-directed MR. ERO 0.21 cm^2 by PISA.    No complications.

## 2012-03-27 NOTE — Progress Notes (Signed)
  Echocardiogram Echocardiogram Transesophageal has been performed.  Frank [name] [facility] 03/27/2012, 11:39 AM

## 2012-03-27 NOTE — H&P (View-Only) (Signed)
Addended by: Verne Carrow D on: 03/20/2012 10:09 AM   Modules accepted: Orders

## 2012-03-27 NOTE — Interval H&P Note (Signed)
History and Physical Interval Note:  03/27/2012 10:55 AM  Frank [name]  has presented today for surgery, with the diagnosis of mitral regurge  The various methods of treatment have been discussed with the patient and family. After consideration of risks, benefits and other options for treatment, the patient has consented to  Procedure(s) (LRB): TRANSESOPHAGEAL ECHOCARDIOGRAM (TEE) (N/A) as a surgical intervention .  The patients' history has been reviewed, patient examined, no change in status, stable for surgery.  I have reviewed the patients' chart and labs.  Questions were answered to the patient's satisfaction.     Cinnamon Morency [employer]

## 2012-03-27 NOTE — Discharge Instructions (Addendum)
Endoscopy Care After Please read the instructions outlined below and refer to this sheet in the next few weeks. These discharge instructions provide you with general information on caring for yourself after you leave the hospital. Your doctor may also give you specific instructions. While your treatment has been planned according to the most current medical practices available, unavoidable complications occasionally occur. If you have any problems or questions after discharge, please call your doctor. HOME CARE INSTRUCTIONS Activity  You may resume your regular activity but move at a slower pace for the next 24 hours.   Take frequent rest periods for the next 24 hours.   Walking will help expel (get rid of) the air and reduce the bloated feeling in your abdomen.   No driving for 24 hours (because of the anesthesia (medicine) used during the test).   You may shower.   Do not sign any important legal documents or operate any machinery for 24 hours (because of the anesthesia used during the test).  Nutrition  Drink plenty of fluids.   You may resume your normal diet.   Begin with a light meal and progress to your normal diet.   Avoid alcoholic beverages for 24 hours or as instructed by your caregiver.  Medications You may resume your normal medications unless your caregiver tells you otherwise. What you can expect today  You may experience abdominal discomfort such as a feeling of fullness or "gas" pains.   You may experience a sore throat for 2 to 3 days. This is normal. Gargling with salt water may help this.  Follow-up Your doctor will discuss the results of your test with you. SEEK IMMEDIATE MEDICAL CARE IF:  You have excessive nausea (feeling sick to your stomach) and/or vomiting.   You have severe abdominal pain and distention (swelling).   You have trouble swallowing.   You have a temperature over 100 F (37.8 C).   You have rectal bleeding or vomiting of blood.   Document Released: 06/19/2004 Document Revised: 07/18/2011 Document Reviewed: 12/31/2007 [employer] Patient Information 2012 [employer]. Transesophageal Echocardiography A transesophageal echocardiogram (TEE) is a special type of test that produces images of the heart by sound waves (echocardiogram). This type of echocardiogram can obtain better images of the heart than a standard echocardiogram. A TEE is done by passing a flexible tube down the esophagus. The heart is located in front of the esophagus. Because the heart and esophagus are close to one another, your caregiver can take very clear, detailed pictures of the heart via ultrasound waves. WHY HAVE A TEE? Your caregiver may need more information based on your medical condition. A TEE is usually performed due to the following:  Your caregiver needs more information based on standard echocardiogram findings.   If you had a stroke, this might have happened because a clot formed in your heart. A TEE can visualize different areas of the heart and check for clots.   To check valve anatomy and function. Your caregiver will especially look at the mitral valve.   To check for redness, soreness, and swelling (inflammation) on the inside lining of the heart (endocarditis).   To evaluate the dividing wall (septum) of the heart and presence of a hole that did not close after birth (patent foramen ovale, PFO).   To help diagnose a tear in the wall of the aorta (aortic dissection).   During cardiac valve surgery, a TEE probe is placed. This allows the surgeon to assess the valve repair before   closing the chest.  LET YOUR CAREGIVER KNOW ABOUT:   Swallowing difficulties.   An esophageal obstruction.   Use of aspirin or antiplatelet therapy.  RISKS AND COMPLICATIONS  Though extremely rare, an esophageal tear (rupture) is a potential complication. BEFORE THE PROCEDURE   Arrive at least 1 hour before the procedure or as told by your caregiver.    Do not eat or drink for 6 hours before the procedure or as told by your caregiver.   An intravenous (IV) access tube will be started in the arm.  PROCEDURE   A medicine to help you relax (sedative) will be given through the IV.   A medicine that numbs the area (local anesthetic) may be sprayed to the back of the throat.   Your blood pressure, heart rate, and breathing (vital signs) will be monitored during the procedure.   The TEE probe is a long, flexible tube. It is about the width of an adult male's index finger. The tip of the probe is placed into the back of the mouth and you will be asked to swallow. This helps to pass the tip of the probe into the esophagus. Once the tip of the probe is in the correct area, your caregiver can take pictures of the heart.   A TEE is usually not a painful procedure. You may feel the probe press against the back of the throat. The probe does not enter the trachea and does not affect your breathing.   Your time spent at the hospital is usually less than 2 hours.  AFTER THE PROCEDURE   You will be in bed, resting until you have fully returned to consciousness.   When you first awaken, your throat may feel slightly sore and will probably still feel numb. This will improve slowly over time.   You will not be allowed to eat or drink until it is clear that numbness has improved.   Once you have been able to drink, urinate, and sit on the edge of the bed without feeling sick to your stomach (nauseous) or dizzy, you may be cleared to dress and go home.   Do not drive yourself home. You have had medications that can continue to make you feel drowsy and can impair your reflexes.   You should have a friend or family member with you for the next 24 hours after your examination.  Obtaining the test results It is your responsibility to obtain your test results. Ask the lab or department performing the test when and how you will get your results. SEEK  IMMEDIATE MEDICAL CARE IF:   There is chest pain.   You have a hard time breathing or have shortness of breath.   You cough or throw up (vomit) blood.  MAKE SURE YOU:   Understand these instructions.   Will watch this condition.   Will get help right away if you is not doing well or gets worse.  Document Released: 01/26/2003 Document Revised: 10/25/2011 Document Reviewed: 04/19/2009 ExitCare Patient Information 2012 [employer]. 

## 2012-03-28 ENCOUNTER — Encounter (HOSPITAL_COMMUNITY): Payer: Self-pay | Admitting: Cardiology

## 2012-03-28 ENCOUNTER — Ambulatory Visit (INDEPENDENT_AMBULATORY_CARE_PROVIDER_SITE_OTHER): Payer: Self-pay | Admitting: Internal Medicine

## 2012-03-28 VITALS — BP 120/80 | Temp 97.0°F | Ht 68.0 in | Wt 178.5 lb

## 2012-03-28 DIAGNOSIS — I509 Heart failure, unspecified: Secondary | ICD-10-CM

## 2012-03-28 DIAGNOSIS — I10 Essential (primary) hypertension: Secondary | ICD-10-CM

## 2012-03-28 DIAGNOSIS — E785 Hyperlipidemia, unspecified: Secondary | ICD-10-CM

## 2012-03-28 DIAGNOSIS — K429 Umbilical hernia without obstruction or gangrene: Secondary | ICD-10-CM | POA: Insufficient documentation

## 2012-03-28 DIAGNOSIS — I080 Rheumatic disorders of both mitral and aortic valves: Secondary | ICD-10-CM

## 2012-03-28 MED ORDER — ISOSORBIDE MONONITRATE 15 MG HALF TABLET: 15.0000 mg | ORAL_TABLET | Freq: Every day | ORAL | Status: DC

## 2012-03-28 NOTE — Progress Notes (Signed)
Patient ID: Frank [name], male   DOB: 04-04-1956, 56 y.o.   MRN: [id number] HPI:   Patient is a 56 year old male, with a past medical history listed below, presented initially in February 2013 to the outpatient clinic with complaints of worsening fatigue and shortness of breath, at that time a referral to cardiology was made, where was noted the patient has mitral regurgitation, a 2-D echo was ordered which showed moderate mitral regurg, transesophageal echo confirmed these findings. Patient still complains of shortness of breath with mild activity, denies any weight gain. Patient also reports mild abdominal pain at the umbilical region which has been ongoing for the past several weeks, no other associated symptoms such as fever chills, changes in bowel habits. Patient reports compliance with his medications, denies any chest pain, fever, nausea, vomiting or any other complaints.   Review of Systems: Negative except per history of present illness  Physical Exam:  Nursing notes and vitals reviewed General:  alert, well-developed, and cooperative to examination.   Lungs:  normal respiratory effort, no accessory muscle use, normal breath sounds, no crackles, and no wheezes. Heart:  normal rate, regular rhythm, 2/6 systolic ejection murmur at apex radiating to the axilla  Abdomen:  soft, non-tender, normal bowel sounds, no distention, no guarding, no rebound tenderness, no hepatomegaly, and no splenomegaly.   Extremities:  No cyanosis, clubbing, edema Neurologic:  alert & oriented X3, nonfocal exam  Meds: Current Outpatient Prescriptions on File Prior to Visit  Medication Sig Dispense Refill  . aspirin 325 MG tablet Take 325 mg by mouth daily.        . calcium carbonate (TUMS) 500 MG chewable tablet Chew 1 tablet by mouth daily.        . citalopram (CELEXA) 20 MG tablet Take 1 tablet (20 mg total) by mouth daily.  31 tablet  3  . clopidogrel (PLAVIX) 75 MG tablet Take 1 tablet (75 mg total) by mouth  daily.  90 tablet  4  . furosemide (LASIX) 20 MG tablet Take 1 tablet (20 mg total) by mouth daily.  30 tablet  5  . hydrALAZINE (APRESOLINE) 10 MG tablet TAKE 1 TABLET BY MOUTH THREE TIMES DAILY  90 tablet  2  . isosorbide mononitrate (IMDUR) 15 mg TB24 Take 0.5 tablets (15 mg total) by mouth daily.  30 tablet  3  . lisinopril (PRINIVIL,ZESTRIL) 2.5 MG tablet Take 1 tablet (2.5 mg total) by mouth daily.  90 tablet  4  . metoprolol tartrate (LOPRESSOR) 25 MG tablet Take 1 tablet (25 mg total) by mouth 2 (two) times daily.  180 tablet  4  . omeprazole (PRILOSEC) 20 MG capsule Take 20 mg by mouth daily.        . potassium chloride (K-DUR,KLOR-CON) 10 MEQ tablet Take 1 tablet (10 mEq total) by mouth daily.  30 tablet  5  . simvastatin (ZOCOR) 40 MG tablet Take 1 tablet (40 mg total) by mouth at bedtime.  90 tablet  4   Current Facility-Administered Medications on File Prior to Visit  Medication Dose Route Frequency Provider Last Rate Last Dose  . DISCONTD: 0.45 % sodium chloride infusion   Intravenous Continuous Laurey Morale, MD      . DISCONTD: 0.45 % sodium chloride infusion   Intravenous Continuous Kathleene Hazel, MD 20 mL/hr at 03/27/12 1030 500 mL at 03/27/12 1030  . DISCONTD: 0.9 %  sodium chloride infusion  250 mL Intravenous PRN Laurey Morale, MD      .  DISCONTD: 0.9 %  sodium chloride infusion  250 mL Intravenous PRN Kathleene Hazel, MD      . DISCONTD: benzocaine (HURRICAINE) 20 % mouth spray 1 application  1 application Mouth/Throat PRN Laurey Morale, MD      . DISCONTD: benzocaine (HURRICAINE) 20 % mouth spray 1 application  1 application Mouth/Throat PRN Kathleene Hazel, MD      . DISCONTD: butamben-tetracaine-benzocaine (CETACAINE) spray    PRN Laurey Morale, MD   2 spray at 03/27/12 1107  . DISCONTD: fentaNYL (SUBLIMAZE) injection 250 mcg  250 mcg Intravenous Once Laurey Morale, MD      . DISCONTD: fentaNYL (SUBLIMAZE) injection 250 mcg  250 mcg  Intravenous Once Kathleene Hazel, MD      . DISCONTD: fentaNYL (SUBLIMAZE) injection    PRN Laurey Morale, MD   25 mcg at 03/27/12 1110  . DISCONTD: midazolam (VERSED) injection 10 mg  10 mg Intravenous Once Laurey Morale, MD      . DISCONTD: midazolam (VERSED) injection 10 mg  10 mg Intravenous Once Kathleene Hazel, MD      . DISCONTD: midazolam (VERSED) injection    PRN Laurey Morale, MD   2 mg at 03/27/12 1113  . DISCONTD: sodium chloride 0.9 % injection 3 mL  3 mL Intravenous Q12H Laurey Morale, MD      . DISCONTD: sodium chloride 0.9 % injection 3 mL  3 mL Intravenous PRN Laurey Morale, MD      . DISCONTD: sodium chloride 0.9 % injection 3 mL  3 mL Intravenous Q12H Kathleene Hazel, MD      . DISCONTD: sodium chloride 0.9 % injection 3 mL  3 mL Intravenous PRN Kathleene Hazel, MD        Allergies: Sulfur Past Medical History  Diagnosis Date  . Mitral regurgitation     by echo 05/11  . Hyperlipidemia   . GERD (gastroesophageal reflux disease)   . Hypertension   . CAD (coronary artery disease)     5V CABG in 2004  . Myocardial infarction   . CHF (congestive heart failure)   . Shortness of breath   . Arthritis     hands, shoulders, arms  . Depression    Past Surgical History  Procedure Date  . Coronary angioplasty with stent placement 05/11    stent to RCA x 1 and SVG-acute marginal x2. EF normal  . Coronary artery bypass graft 2004    x4  . Tee without cardioversion 03/27/2012    Procedure: TRANSESOPHAGEAL ECHOCARDIOGRAM (TEE);  Surgeon: Laurey Morale, MD;  Location: [facility] ENDOSCOPY;  Service: Cardiovascular;  Laterality: N/A;  to be done at 1100   Family History  Problem Relation Age of Onset  . Heart disease Mother   . Cancer Mother     ? type  . Diabetes Father   . Hyperlipidemia Father   . Hypertension Father    History   Social History  . Marital Status: Legally Separated    Spouse Name: N/A    Number of Children: 2  .  Years of Education: N/A   Occupational History  . 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 Smoking status: Former Smoker -- 3.0 packs/day for 40 years    Types: Cigarettes    Quit date: 01/23/2003  . Smokeless tobacco: Not on file  . Alcohol Use: No  . Drug Use: No  . Sexually Active:  Yes   Other Topics Concern  . Not on file   Social History Narrative  . No narrative on file

## 2012-03-28 NOTE — Patient Instructions (Signed)
Please take your medication Imdur as instructed

## 2012-03-28 NOTE — Assessment & Plan Note (Addendum)
Patient underwent trans-esophageal echo last week which demonstrated Mitral valve: Highly eccentric, posteriorly-directed mild to moderate mitral regurgitation. Effective regurgitant orifice: 0.21cm^2 (PISA). Patient's mitral regurg is not severe enough to warrant surgical intervention at this point, continue to manage medically. Continue afterload reduction with hydralazine. Continue to encourage healthy diet and exercise. Add isosorbide mononitrate today, and titrate up at next visit for symptomatic relief of his shortness of breath.

## 2012-03-28 NOTE — Assessment & Plan Note (Signed)
Excellent control, continue current dose of statin

## 2012-03-28 NOTE — Assessment & Plan Note (Signed)
Well controlled on current treatment, No new changes made today, Will continue to monitor.   

## 2012-03-28 NOTE — Assessment & Plan Note (Signed)
Patient likely has small blood hernia per exam is causing some mild pain, will continue to monitor this if it grows we'll consider surgical referral.

## 2012-04-02 ENCOUNTER — Encounter: Payer: Self-pay | Admitting: *Deleted

## 2012-04-02 NOTE — Progress Notes (Signed)
I called [facility] and talked with Pharmacist, Jessica # [phone] The table of Imdur is scored but cutting in 1/2 is contraindicated. Order given for Imdur 30 mg once a day.

## 2012-04-02 NOTE — Progress Notes (Signed)
I agree with the instructions not to split the ER Imdur.  It is recommended that those tablets not be split despite being scored.

## 2012-04-04 ENCOUNTER — Ambulatory Visit: Payer: Self-pay | Admitting: Cardiovascular Disease

## 2012-04-09 ENCOUNTER — Other Ambulatory Visit: Payer: Self-pay | Admitting: Internal Medicine

## 2012-04-17 ENCOUNTER — Ambulatory Visit: Payer: Self-pay | Admitting: Cardiovascular Disease

## 2012-05-06 ENCOUNTER — Encounter: Payer: Self-pay | Admitting: *Deleted

## 2012-05-06 ENCOUNTER — Ambulatory Visit (INDEPENDENT_AMBULATORY_CARE_PROVIDER_SITE_OTHER): Payer: Self-pay | Admitting: Cardiovascular Disease

## 2012-05-06 ENCOUNTER — Encounter: Payer: Self-pay | Admitting: Cardiovascular Disease

## 2012-05-06 VITALS — BP 115/76 | HR 55 | Ht 68.0 in | Wt 174.0 lb

## 2012-05-06 DIAGNOSIS — I2510 Atherosclerotic heart disease of native coronary artery without angina pectoris: Secondary | ICD-10-CM

## 2012-05-06 LAB — BASIC METABOLIC PANEL WITH GFR
BUN: 15 mg/dL (ref 6–23)
CO2: 27 meq/L (ref 19–32)
Calcium: 8.9 mg/dL (ref 8.4–10.5)
Chloride: 107 meq/L (ref 96–112)
Creatinine, Ser: 1.1 mg/dL (ref 0.4–1.5)
GFR: 74.87 mL/min
Glucose, Bld: 100 mg/dL — ABNORMAL HIGH (ref 70–99)
Potassium: 4.2 meq/L (ref 3.5–5.1)
Sodium: 141 meq/L (ref 135–145)

## 2012-05-06 LAB — CBC WITH DIFFERENTIAL/PLATELET
Basophils Absolute: 0.1 10*3/uL (ref 0.0–0.1)
Basophils Relative: 0.8 % (ref 0.0–3.0)
Eosinophils Absolute: 0.4 10*3/uL (ref 0.0–0.7)
Eosinophils Relative: 5.5 % — ABNORMAL HIGH (ref 0.0–5.0)
HCT: 43.5 % (ref 39.0–52.0)
Hemoglobin: 14.6 g/dL (ref 13.0–17.0)
Lymphocytes Relative: 33 % (ref 12.0–46.0)
Lymphs Abs: 2.3 10*3/uL (ref 0.7–4.0)
MCHC: 33.4 g/dL (ref 30.0–36.0)
MCV: 89.1 fl (ref 78.0–100.0)
Monocytes Absolute: 0.7 10*3/uL (ref 0.1–1.0)
Monocytes Relative: 10.7 % (ref 3.0–12.0)
Neutro Abs: 3.4 10*3/uL (ref 1.4–7.7)
Neutrophils Relative %: 50 % (ref 43.0–77.0)
Platelets: 191 10*3/uL (ref 150.0–400.0)
RBC: 4.89 Mil/uL (ref 4.22–5.81)
RDW: 13.1 % (ref 11.5–14.6)
WBC: 6.9 10*3/uL (ref 4.5–10.5)

## 2012-05-06 LAB — PROTIME-INR
INR: 1 ratio (ref 0.8–1.0)
Prothrombin Time: 11.1 s (ref 10.2–12.4)

## 2012-05-06 MED ORDER — NITROGLYCERIN 0.4 MG SL SUBL: 0.4000 mg | SUBLINGUAL_TABLET | SUBLINGUAL | Status: DC | PRN

## 2012-05-06 NOTE — Assessment & Plan Note (Signed)
He is having exertional chest pains and SOB. He is known to have CAD with prior CABG in 2004 and stenting in 2011. His LV function is normal and his valvular disease is not severe. Will arrange cardiac cath on 05/20/12 in the outpatient cath lab. Risks and benefits are reviewed. He agrees to proceed. Labs today.

## 2012-05-06 NOTE — Progress Notes (Signed)
 History of Present Illness: 56 yo male with a history of CAD s/p CABG 2004 and stenting in 2011, HLD, HTN, GERD and mitral regurgitation who is here today for cardiac follow up. He has been followed by Dr. Nahser but has been non-compliant with f/u appointments per the record and he was here to see me in may 2013 for the first time. Echo 01/31/12 with normal LV size and function with at least moderate MR. He told me at the first visit that he felt fatigued and had SOB. He had no LE edema, orthopnea or chest pain. I arranged a TEE on 03/27/12 to evaluate his MR and this showed mild to moderate MR.   He is here today for follow up. He continues to have exertional chest pressure and dyspnea as well as fatigue.   Primary Care Physician: Tobbia, Patrick  Last Lipid Profile:  Lipid Panel     Component Value Date/Time   CHOL 136 01/02/2012 1049   TRIG 121 01/02/2012 1049   HDL 40 01/02/2012 1049   CHOLHDL 3.4 01/02/2012 1049   VLDL 24 01/02/2012 1049   LDLCALC 72 01/02/2012 1049     Past Medical History  Diagnosis Date  . Mitral regurgitation     by echo 05/11  . Hyperlipidemia   . GERD (gastroesophageal reflux disease)   . Hypertension   . CAD (coronary artery disease)     5V CABG in 2004  . Myocardial infarction   . CHF (congestive heart failure)   . Shortness of breath   . Arthritis     hands, shoulders, arms  . Depression     Past Surgical History  Procedure Date  . Coronary angioplasty with stent placement 05/11    stent to RCA x 1 and SVG-acute marginal x2. EF normal  . Coronary artery bypass graft 2004    x4  . Tee without cardioversion 03/27/2012    Procedure: TRANSESOPHAGEAL ECHOCARDIOGRAM (TEE);  Surgeon: Dalton S McLean, MD;  Location: [facility] ENDOS[facility];  Service: Cardiovascular;  Laterality: N/A;  to be done at 1100    Current Outpatient Prescriptions  Medication Sig Dispense Refill  . aspirin 325 MG tablet Take 325 mg by mouth daily.        . calcium carbonate (TUMS) 500  MG chewable tablet Chew 1 tablet by mouth daily.        . citalopram (CELEXA) 20 MG tablet Take 1 tablet (20 mg total) by mouth daily.  31 tablet  3  . clopidogrel (PLAVIX) 75 MG tablet Take 1 tablet (75 mg total) by mouth daily.  90 tablet  4  . furosemide (LASIX) 20 MG tablet Take 1 tablet (20 mg total) by mouth daily.  30 tablet  5  . hydrALAZINE (APRESOLINE) 10 MG tablet TAKE 1 TABLET BY MOUTH THREE TIMES DAILY  90 tablet  0  . lisinopril (PRINIVIL,ZESTRIL) 2.5 MG tablet Take 1 tablet (2.5 mg total) by mouth daily.  90 tablet  4  . metoprolol tartrate (LOPRESSOR) 25 MG tablet Take 1 tablet (25 mg total) by mouth 2 (two) times daily.  180 tablet  4  . omeprazole (PRILOSEC) 20 MG capsule Take 20 mg by mouth daily.        . potassium chloride (K-DUR,KLOR-CON) 10 MEQ tablet Take 1 tablet (10 mEq total) by mouth daily.  30 tablet  5  . simvastatin (ZOCOR) 40 MG tablet Take 1 tablet (40 mg total) by mouth at bedtime.  90 tablet  4      Allergies  Allergen Reactions  . Sulfur     REACTION: rash    History   Social History  . Marital Status: Legally Separated    Spouse Name: N/A    Number of Children: 2  . Years of Education: N/A   Occupational History  . 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 Smoking status: Former Smoker -- 3.0 packs/day for 40 years    Types: Cigarettes    Quit date: 01/23/2003  . Smokeless tobacco: Not on file  . Alcohol Use: No  . Drug Use: No  . Sexually Active: Yes   Other Topics Concern  . Not on file   Social History Narrative  . No narrative on file    Family History  Problem Relation Age of Onset  . Heart disease Mother   . Cancer Mother     ? type  . Diabetes Father   . Hyperlipidemia Father   . Hypertension Father     Review of Systems:  As stated in the HPI and otherwise negative.   BP 115/76  Pulse 55  Ht 5' 8" (1.727 m)  Wt 174 lb (78.926 kg)  BMI 26.46 kg/m2  Physical Examination: General: Well developed, well  nourished, NAD HEENT: OP clear, mucus membranes moist SKIN: warm, dry. No rashes. Neuro: No focal deficits Musculoskeletal: Muscle strength 5/5 all ext Psychiatric: Mood and affect normal Neck: No JVD, no carotid bruits, no thyromegaly, no lymphadenopathy. Lungs:Clear bilaterally, no wheezes, rhonci, crackles Cardiovascular: Regular rate and rhythm. No murmurs, gallops or rubs. Abdomen:Soft. Bowel sounds present. Non-tender.  Extremities: No lower extremity edema. Pulses are 2 + in the bilateral DP/PT.  Echo: 03/27/12:  Left ventricle: The cavity size was normal. Wall thickness was normal. The estimated ejection fraction was 55%. Wall motion was normal; there were no regional wall motion abnormalities. - Aortic valve: There was no stenosis. - Aorta: Normal caliber, minimal plaque. - Mitral valve: Highly eccentric, posteriorly-directed mild to moderate mitral regurgitation. Effective regurgitant orifice: 0.21cm^2 (PISA). - Left atrium: The atrium was mildly dilated. No evidence of thrombus in the atrial cavity or appendage. - Right ventricle: The cavity size was normal. Systolic function was normal. - Right atrium: No evidence of thrombus in the atrial cavity or appendage. - Atrial septum: No defect or patent foramen ovale was identified. Echo contrast study showed no right-to-left atrial level shunt, at baseline or with provocation. Impressions:  - Mild to moderate eccentric MR.   

## 2012-05-06 NOTE — Patient Instructions (Addendum)
Your physician recommends that you schedule a follow-up appointment in:4 weeks.   Your physician has requested that you have a cardiac catheterization. Cardiac catheterization is used to diagnose and/or treat various heart conditions. Doctors may recommend this procedure for a number of different reasons. The most common reason is to evaluate chest pain. Chest pain can be a symptom of coronary artery disease (CAD), and cardiac catheterization can show whether plaque is narrowing or blocking your heart's arteries. This procedure is also used to evaluate the valves, as well as measure the blood flow and oxygen levels in different parts of your heart. For further information please visit [email or web address] Please follow instruction sheet, as given. Scheduled for May 20, 2012

## 2012-05-20 ENCOUNTER — Other Ambulatory Visit: Payer: Self-pay

## 2012-05-20 ENCOUNTER — Ambulatory Visit (HOSPITAL_COMMUNITY)
Admission: RE | Admit: 2012-05-20 | Discharge: 2012-05-21 | Disposition: A | Discharge status: Home / Self Care | Payer: Medicaid Other | Source: Ambulatory Visit | Attending: Cardiovascular Disease | Admitting: Cardiovascular Disease

## 2012-05-20 ENCOUNTER — Encounter (HOSPITAL_COMMUNITY): Payer: Self-pay | Admitting: General Practice

## 2012-05-20 ENCOUNTER — Encounter (HOSPITAL_COMMUNITY)
Admission: RE | Disposition: A | Discharge status: Home / Self Care | Payer: Self-pay | Source: Ambulatory Visit | Attending: Cardiovascular Disease

## 2012-05-20 DIAGNOSIS — K219 Gastro-esophageal reflux disease without esophagitis: Secondary | ICD-10-CM | POA: Insufficient documentation

## 2012-05-20 DIAGNOSIS — F329 Major depressive disorder, single episode, unspecified: Secondary | ICD-10-CM | POA: Insufficient documentation

## 2012-05-20 DIAGNOSIS — R011 Cardiac murmur, unspecified: Secondary | ICD-10-CM

## 2012-05-20 DIAGNOSIS — I2510 Atherosclerotic heart disease of native coronary artery without angina pectoris: Secondary | ICD-10-CM | POA: Insufficient documentation

## 2012-05-20 DIAGNOSIS — E785 Hyperlipidemia, unspecified: Secondary | ICD-10-CM | POA: Insufficient documentation

## 2012-05-20 DIAGNOSIS — M545 Low back pain, unspecified: Secondary | ICD-10-CM | POA: Insufficient documentation

## 2012-05-20 DIAGNOSIS — M129 Arthropathy, unspecified: Secondary | ICD-10-CM | POA: Insufficient documentation

## 2012-05-20 DIAGNOSIS — F411 Generalized anxiety disorder: Secondary | ICD-10-CM | POA: Insufficient documentation

## 2012-05-20 DIAGNOSIS — F3289 Other specified depressive episodes: Secondary | ICD-10-CM | POA: Insufficient documentation

## 2012-05-20 DIAGNOSIS — I10 Essential (primary) hypertension: Secondary | ICD-10-CM | POA: Insufficient documentation

## 2012-05-20 DIAGNOSIS — K429 Umbilical hernia without obstruction or gangrene: Secondary | ICD-10-CM | POA: Insufficient documentation

## 2012-05-20 DIAGNOSIS — I25810 Atherosclerosis of coronary artery bypass graft(s) without angina pectoris: Secondary | ICD-10-CM | POA: Diagnosis present

## 2012-05-20 DIAGNOSIS — F32A Depression, unspecified: Secondary | ICD-10-CM

## 2012-05-20 DIAGNOSIS — I509 Heart failure, unspecified: Secondary | ICD-10-CM | POA: Insufficient documentation

## 2012-05-20 DIAGNOSIS — I059 Rheumatic mitral valve disease, unspecified: Secondary | ICD-10-CM | POA: Insufficient documentation

## 2012-05-20 HISTORY — DX: Anesthesia of skin: R20.2

## 2012-05-20 HISTORY — DX: Paresthesia of skin: R20.0

## 2012-05-20 HISTORY — DX: Other chronic pain: G89.29

## 2012-05-20 HISTORY — PX: CORONARY ANGIOPLASTY WITH STENT PLACEMENT: SHX49

## 2012-05-20 HISTORY — DX: Umbilical hernia without obstruction or gangrene: K42.9

## 2012-05-20 HISTORY — DX: Low back pain, unspecified: M54.50

## 2012-05-20 HISTORY — DX: Anxiety disorder, unspecified: F41.9

## 2012-05-20 HISTORY — DX: Low back pain: M54.5

## 2012-05-20 HISTORY — PX: LEFT HEART CATHETERIZATION WITH CORONARY/GRAFT ANGIOGRAM: SHX5450

## 2012-05-20 SURGERY — LEFT HEART CATHETERIZATION WITH CORONARY/GRAFT ANGIOGRAM
Anesthesia: LOCAL

## 2012-05-20 MED ORDER — LISINOPRIL 2.5 MG PO TABS
2.5000 mg | ORAL_TABLET | Freq: Every day | ORAL | Status: DC
  Filled 2012-05-20 (×2): qty 1

## 2012-05-20 MED ORDER — SIMVASTATIN 40 MG PO TABS
40.0000 mg | ORAL_TABLET | Freq: Every day | ORAL | Status: DC
  Administered 2012-05-20: 40 mg via ORAL
  Filled 2012-05-20 (×2): qty 1

## 2012-05-20 MED ORDER — SODIUM CHLORIDE 0.9 % IV SOLN: INTRAVENOUS | Status: DC

## 2012-05-20 MED ORDER — CITALOPRAM HYDROBROMIDE 20 MG PO TABS
20.0000 mg | ORAL_TABLET | Freq: Every day | ORAL | Status: DC
  Administered 2012-05-20: 20 mg via ORAL
  Filled 2012-05-20 (×2): qty 1

## 2012-05-20 MED ORDER — LIDOCAINE HCL (PF) 1 % IJ SOLN
INTRAMUSCULAR | Status: AC
  Filled 2012-05-20: qty 30

## 2012-05-20 MED ORDER — CLOPIDOGREL BISULFATE 75 MG PO TABS: 75.0000 mg | ORAL_TABLET | Freq: Every day | ORAL | Status: DC

## 2012-05-20 MED ORDER — ACETAMINOPHEN 325 MG PO TABS
650.0000 mg | ORAL_TABLET | ORAL | Status: DC | PRN
  Administered 2012-05-20: 650 mg via ORAL
  Filled 2012-05-20: qty 2

## 2012-05-20 MED ORDER — METOPROLOL TARTRATE 25 MG PO TABS
25.0000 mg | ORAL_TABLET | Freq: Two times a day (BID) | ORAL | Status: DC
  Administered 2012-05-20: 25 mg via ORAL
  Filled 2012-05-20 (×3): qty 1

## 2012-05-20 MED ORDER — SODIUM CHLORIDE 0.9 % IV SOLN: 250.0000 mL | INTRAVENOUS | Status: DC | PRN

## 2012-05-20 MED ORDER — DIAZEPAM 5 MG PO TABS
5.0000 mg | ORAL_TABLET | ORAL | Status: AC
  Administered 2012-05-20: 5 mg via ORAL
  Filled 2012-05-20: qty 1

## 2012-05-20 MED ORDER — BIVALIRUDIN 250 MG IV SOLR
INTRAVENOUS | Status: AC
  Filled 2012-05-20: qty 250

## 2012-05-20 MED ORDER — SODIUM CHLORIDE 0.9 % IV SOLN: INTRAVENOUS | Status: AC

## 2012-05-20 MED ORDER — ASPIRIN 81 MG PO CHEW
324.0000 mg | CHEWABLE_TABLET | ORAL | Status: AC
  Administered 2012-05-20: 324 mg via ORAL
  Filled 2012-05-20: qty 4

## 2012-05-20 MED ORDER — POTASSIUM CHLORIDE CRYS ER 10 MEQ PO TBCR
10.0000 meq | EXTENDED_RELEASE_TABLET | Freq: Every day | ORAL | Status: DC
  Administered 2012-05-20: 10 meq via ORAL
  Filled 2012-05-20 (×2): qty 1

## 2012-05-20 MED ORDER — ASPIRIN 325 MG PO TABS
325.0000 mg | ORAL_TABLET | Freq: Every day | ORAL | Status: DC
  Filled 2012-05-20: qty 1

## 2012-05-20 MED ORDER — FUROSEMIDE 20 MG PO TABS
20.0000 mg | ORAL_TABLET | Freq: Every day | ORAL | Status: DC
  Administered 2012-05-20: 20 mg via ORAL
  Filled 2012-05-20 (×2): qty 1

## 2012-05-20 MED ORDER — MIDAZOLAM HCL 2 MG/2ML IJ SOLN
INTRAMUSCULAR | Status: AC
  Filled 2012-05-20: qty 2

## 2012-05-20 MED ORDER — SODIUM CHLORIDE 0.9 % IJ SOLN: 3.0000 mL | Freq: Two times a day (BID) | INTRAMUSCULAR | Status: DC

## 2012-05-20 MED ORDER — SODIUM CHLORIDE 0.9 % IJ SOLN: 3.0000 mL | INTRAMUSCULAR | Status: DC | PRN

## 2012-05-20 MED ORDER — FENTANYL CITRATE 0.05 MG/ML IJ SOLN
INTRAMUSCULAR | Status: AC
  Filled 2012-05-20: qty 2

## 2012-05-20 MED ORDER — ONDANSETRON HCL 4 MG/2ML IJ SOLN: 4.0000 mg | Freq: Four times a day (QID) | INTRAMUSCULAR | Status: DC | PRN

## 2012-05-20 MED ORDER — NITROGLYCERIN 0.2 MG/ML ON CALL CATH LAB
INTRAVENOUS | Status: AC
  Filled 2012-05-20: qty 1

## 2012-05-20 MED ORDER — EPINEPHRINE HCL (NASAL) 0.1 % NA SOLN
NASAL | Status: AC
  Filled 2012-05-20: qty 30

## 2012-05-20 MED ORDER — NITROGLYCERIN 0.4 MG SL SUBL: 0.4000 mg | SUBLINGUAL_TABLET | SUBLINGUAL | Status: DC | PRN

## 2012-05-20 MED ORDER — HEPARIN (PORCINE) IN NACL 2-0.9 UNIT/ML-% IJ SOLN
INTRAMUSCULAR | Status: AC
  Filled 2012-05-20: qty 1000

## 2012-05-20 MED ORDER — HYDRALAZINE HCL 10 MG PO TABS
10.0000 mg | ORAL_TABLET | Freq: Three times a day (TID) | ORAL | Status: DC
  Administered 2012-05-20 – 2012-05-21 (×3): 10 mg via ORAL
  Filled 2012-05-20 (×6): qty 1

## 2012-05-20 NOTE — Interval H&P Note (Signed)
History and Physical Interval Note:  05/20/2012 9:32 AM  Frank [name]  has presented today for surgery, with the diagnosis of chest pain  The various methods of treatment have been discussed with the patient and family. After consideration of risks, benefits and other options for treatment, the patient has consented to  Procedure(s) (LRB): LEFT HEART CATHETERIZATION WITH CORONARY/GRAFT ANGIOGRAM (N/A) as a surgical intervention .  The patient's history has been reviewed, patient examined, no change in status, stable for surgery.  I have reviewed the patients' chart and labs.  Questions were answered to the patient's satisfaction.     [name]

## 2012-05-20 NOTE — H&P (View-Only) (Signed)
History of Present Illness: 56 yo male with a history of CAD s/p CABG 2004 and stenting in 2011, HLD, HTN, GERD and mitral regurgitation who is here today for cardiac follow up. He has been followed by Dr. Elease Hashimoto but has been non-compliant with f/u appointments per the record and he was here to see me in may 2013 for the first time. Echo 01/31/12 with normal LV size and function with at least moderate MR. He told me at the first visit that he felt fatigued and had SOB. He had no LE edema, orthopnea or chest pain. I arranged a TEE on 03/27/12 to evaluate his MR and this showed mild to moderate MR.   He is here today for follow up. He continues to have exertional chest pressure and dyspnea as well as fatigue.   Primary Care Physician: Darnelle Maffucci  Last Lipid Profile:  Lipid Panel     Component Value Date/Time   CHOL 136 01/02/2012 1049   TRIG 121 01/02/2012 1049   HDL 40 01/02/2012 1049   CHOLHDL 3.4 01/02/2012 1049   VLDL 24 01/02/2012 1049   LDLCALC 72 01/02/2012 1049     Past Medical History  Diagnosis Date  . Mitral regurgitation     by echo 05/11  . Hyperlipidemia   . GERD (gastroesophageal reflux disease)   . Hypertension   . CAD (coronary artery disease)     5V CABG in 2004  . Myocardial infarction   . CHF (congestive heart failure)   . Shortness of breath   . Arthritis     hands, shoulders, arms  . Depression     Past Surgical History  Procedure Date  . Coronary angioplasty with stent placement 05/11    stent to RCA x 1 and SVG-acute marginal x2. EF normal  . Coronary artery bypass graft 2004    x4  . Tee without cardioversion 03/27/2012    Procedure: TRANSESOPHAGEAL ECHOCARDIOGRAM (TEE);  Surgeon: Laurey Morale, MD;  Location: [facility] ENDOSCOPY;  Service: Cardiovascular;  Laterality: N/A;  to be done at 1100    Current Outpatient Prescriptions  Medication Sig Dispense Refill  . aspirin 325 MG tablet Take 325 mg by mouth daily.        . calcium carbonate (TUMS) 500  MG chewable tablet Chew 1 tablet by mouth daily.        . citalopram (CELEXA) 20 MG tablet Take 1 tablet (20 mg total) by mouth daily.  31 tablet  3  . clopidogrel (PLAVIX) 75 MG tablet Take 1 tablet (75 mg total) by mouth daily.  90 tablet  4  . furosemide (LASIX) 20 MG tablet Take 1 tablet (20 mg total) by mouth daily.  30 tablet  5  . hydrALAZINE (APRESOLINE) 10 MG tablet TAKE 1 TABLET BY MOUTH THREE TIMES DAILY  90 tablet  0  . lisinopril (PRINIVIL,ZESTRIL) 2.5 MG tablet Take 1 tablet (2.5 mg total) by mouth daily.  90 tablet  4  . metoprolol tartrate (LOPRESSOR) 25 MG tablet Take 1 tablet (25 mg total) by mouth 2 (two) times daily.  180 tablet  4  . omeprazole (PRILOSEC) 20 MG capsule Take 20 mg by mouth daily.        . potassium chloride (K-DUR,KLOR-CON) 10 MEQ tablet Take 1 tablet (10 mEq total) by mouth daily.  30 tablet  5  . simvastatin (ZOCOR) 40 MG tablet Take 1 tablet (40 mg total) by mouth at bedtime.  90 tablet  4  Allergies  Allergen Reactions  . Sulfur     REACTION: rash    History   Social History  . Marital Status: Legally Separated    Spouse Name: N/A    Number of Children: 2  . Years of Education: N/A   Occupational History  . 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 Smoking status: Former Smoker -- 3.0 packs/day for 40 years    Types: Cigarettes    Quit date: 01/23/2003  . Smokeless tobacco: Not on file  . Alcohol Use: No  . Drug Use: No  . Sexually Active: Yes   Other Topics Concern  . Not on file   Social History Narrative  . No narrative on file    Family History  Problem Relation Age of Onset  . Heart disease Mother   . Cancer Mother     ? type  . Diabetes Father   . Hyperlipidemia Father   . Hypertension Father     Review of Systems:  As stated in the HPI and otherwise negative.   BP 115/76  Pulse 55  Ht 5\' 8"  (1.727 m)  Wt 174 lb (78.926 kg)  BMI 26.46 kg/m2  Physical Examination: General: Well developed, well  nourished, NAD HEENT: OP clear, mucus membranes moist SKIN: warm, dry. No rashes. Neuro: No focal deficits Musculoskeletal: Muscle strength 5/5 all ext Psychiatric: Mood and affect normal Neck: No JVD, no carotid bruits, no thyromegaly, no lymphadenopathy. Lungs:Clear bilaterally, no wheezes, rhonci, crackles Cardiovascular: Regular rate and rhythm. No murmurs, gallops or rubs. Abdomen:Soft. Bowel sounds present. Non-tender.  Extremities: No lower extremity edema. Pulses are 2 + in the bilateral DP/PT.  Echo: 03/27/12:  Left ventricle: The cavity size was normal. Wall thickness was normal. The estimated ejection fraction was 55%. Wall motion was normal; there were no regional wall motion abnormalities. - Aortic valve: There was no stenosis. - Aorta: Normal caliber, minimal plaque. - Mitral valve: Highly eccentric, posteriorly-directed mild to moderate mitral regurgitation. Effective regurgitant orifice: 0.21cm^2 (PISA). - Left atrium: The atrium was mildly dilated. No evidence of thrombus in the atrial cavity or appendage. - Right ventricle: The cavity size was normal. Systolic function was normal. - Right atrium: No evidence of thrombus in the atrial cavity or appendage. - Atrial septum: No defect or patent foramen ovale was identified. Echo contrast study showed no right-to-left atrial level shunt, at baseline or with provocation. Impressions:  - Mild to moderate eccentric MR.

## 2012-05-20 NOTE — CV Procedure (Signed)
Cardiac Catheterization Operative Report  HUXTON GLAUS [id number] 7/2/201310:34 AM Darnelle Maffucci, MD  Procedure Performed:  1. Left Heart Catheterization 2. Selective Coronary Angiography 3. SVG angiography 4. LIMA graft angiography 5. Left ventricular angiogram 6. PTCA/DES x 2 SVG to PDA  Operator: Verne Carrow, MD  Indication:  Known CAD s/p 4V CABG and known to have a patent graft to PDA and patent LIMA to LAD by cath in 2011 with placement of 2 drug eluting stents in the SVG to the PDA and DES x 1 in mid RCA in June 2011. Recent worsening of exertional dyspnea and fatigue.                                  Procedure Details: The risks, benefits, complications, treatment options, and expected outcomes were discussed with the patient. The patient and/or family concurred with the proposed plan, giving informed consent. The patient was brought to the cath lab after IV hydration was begun and oral premedication was given. The patient was further sedated with Versed and Fentanyl. The right groin was prepped and draped in the usual manner. Using the modified Seldinger access technique, a 5 French sheath was placed in the right femoral artery. Standard diagnostic catheters were used to perform selective coronary angiography. The JR-4 catheter was used to engage the SVG to the PDA and the LIMA graft to the LAD. A pigtail catheter was used to perform a left ventricular angiogram. The patient was found to have severe disease in the ostium of the vein graft and in the distal body of the vein graft within the stent and distal to the stent. I elected to proceed to intervention of the vein graft. The sheath was upsized to a 6 [place] system. He was given a bolus of Angiomax and a drip was started. He has been on chronic Plavix therapy. An RCB guide was used to engage the SVG to the PDA. When the ACT was greater than 200, I passed a Cougar IC wire down the vein graft to the PDA and out into the  target vessel. A 3.0 x 20 mm Leland balloon was used to dilate the severe stenosis in the distal stented segment and the proximal stented segment. There was disease noted in the body of the vein graft proximal to the proximal stent and distal to the distal stent. I then deployed a 2.75 x 20 mm Promus Element DES in the distal body of the SVG to the PDA. Angiography demonstrated obstruction in the ostium of the vein graft. A 3.0 x 16 mm Promus Element DES was deployed in the ostium of the vein graft extending down into the proximal body of the vein graft. There was excellent flow into the distal vessel.   There were no immediate complications. The patient was taken to the recovery area in stable condition.   Hemodynamic Findings: Central aortic pressure: 107/60 Left ventricular pressure: 112/13/26  Angiographic Findings:  Left main:  No obstructive disease noted.   Left Anterior Descending Artery: Moderate sized vessel that courses to the apex. There is diffuse proximal 80% stenosis and 100% mid occlusion. The mid and distal vessel fills from the LIMA graft.   Circumflex Artery: Moderate sized vessel with mild plaque. The small OM branch has 80% ostial stenosis. The Ramus intermediate branch has proximal 50% stenosis which appears unchanged from last cath.   Right Coronary Artery: Small to moderate sized vessel with  diffuse plaque. There is a patent stent mid vessel with 40-50% in-stent restenosis. The PDA is occluded and fills from the graft. The posterolateral segment fills antegrade. There appears to be a 80% stenosis leading into the small PL segment at the site of the old graft anastamosis. This vessel is 1.75 mm in diameter.   Graft Anatomy:   SVG to PDA is patent with stents present in the proximal/mid and distal body of vein graft. There is 70-80% stenosis in the ostium of the vein graft extending down into the stented segment. The mid stented segment has diffuse 60-70% restenosis. The distal  stented segment has 90% restenosis. Just beyond the distal stent there is a 60-70% stenosis. The SVG limb to the PLA is known to be occluded.   SVG to OM is known to be occluded and was not selectively engaged.   SVG to Diagonal is known to be occluded and was not selectively engaged.   LIMA to LAD was patent.   Left Ventricular Angiogram: LVEF 55% with moderate MR  Impression: 1.  Severe triple vessel CAD s/p 5V CABG with 2/5 patent bypass grafts.  2.  Severe stenosis SVG to PDA in the proximal and distal stented segments in the body of the SVG 3.  Successful PTCA/DES x 2 body of SVG to PDA 4. Diffuse disease in RCA and intermediate branch 5. Preserved LV systolic function 6. Moderate MR  Recommendations: Continue current medical management including ASA and Plavix.        Complications:  None. The patient tolerated the procedure well.

## 2012-05-21 ENCOUNTER — Encounter (HOSPITAL_COMMUNITY): Payer: Self-pay | Admitting: Cardiology

## 2012-05-21 DIAGNOSIS — I2510 Atherosclerotic heart disease of native coronary artery without angina pectoris: Secondary | ICD-10-CM

## 2012-05-21 LAB — CBC
Platelets: 161 10*3/uL (ref 150–400)
RDW: 12.6 % (ref 11.5–15.5)
WBC: 6.8 10*3/uL (ref 4.0–10.5)

## 2012-05-21 LAB — BASIC METABOLIC PANEL
BUN: 14 mg/dL (ref 6–23)
Calcium: 9.2 mg/dL (ref 8.4–10.5)
GFR calc non Af Amer: 81 mL/min — ABNORMAL LOW (ref >90)
Glucose, Bld: 162 mg/dL — ABNORMAL HIGH (ref 70–99)
Potassium: 4.3 mEq/L (ref 3.5–5.1)

## 2012-05-21 LAB — POCT ACTIVATED CLOTTING TIME: Activated Clotting Time: 344 seconds

## 2012-05-21 MED ORDER — PANTOPRAZOLE SODIUM 40 MG PO TBEC: 40.0000 mg | DELAYED_RELEASE_TABLET | Freq: Every day | ORAL | Status: DC

## 2012-05-21 MED ORDER — ASPIRIN 81 MG PO TABS: 81.0000 mg | ORAL_TABLET | Freq: Every day | ORAL | Status: DC

## 2012-05-21 MED FILL — Dextrose Inj 5%: INTRAVENOUS | Qty: 50 | Status: AC

## 2012-05-21 NOTE — Progress Notes (Signed)
    SUBJECTIVE: No chest pain or SOB this am.   BP 123/80  Pulse 64  Temp 97.6 F (36.4 C) (Oral)  Resp 18  Ht 5\' 8"  (1.727 m)  Wt 177 lb 7.5 oz (80.5 kg)  BMI 26.98 kg/m2  SpO2 97%  Intake/Output Summary (Last 24 hours) at 05/21/12 0736 Last data filed at 05/20/12 1700  Gross per 24 hour  Intake    360 ml  Output    400 ml  Net    -40 ml    PHYSICAL EXAM General: Well developed, well nourished, in no acute distress. Alert and oriented x 3.  Psych:  Good affect, responds appropriately Neck: No JVD. No masses noted.  Lungs: Clear bilaterally with no wheezes or rhonci noted.  Heart: RRR with no murmurs noted. Abdomen: Bowel sounds are present. Soft, non-tender.  Extremities: No lower extremity edema. Right groin cath site ok.   LABS: Current Meds:    . aspirin  325 mg Oral Daily  . bivalirudin      . citalopram  20 mg Oral Daily  . clopidogrel  75 mg Oral Daily  . fentaNYL      . furosemide  20 mg Oral Daily  . heparin      . hydrALAZINE  10 mg Oral Q8H  . lidocaine      . lisinopril  2.5 mg Oral Daily  . metoprolol tartrate  25 mg Oral BID  . midazolam      . nitroGLYCERIN      . potassium chloride  10 mEq Oral Daily  . simvastatin  40 mg Oral QHS  . DISCONTD: sodium chloride  3 mL Intravenous Q12H     ASSESSMENT AND PLAN:  1. CAD: Pt admitted after outpatient cardiac cath yesterday. He was found to have severe disease in the SVG to the PDA. He is now s/p DES x 2 in the body of the vein graft. He did well overnight. Will continue ASA and Plavix for one year. Continue statin, beta blocker, Ace-inh. Will check BMET and CBC before discharge.   2. Dispo: D/C home today. Follow up with me in 3-4 weeks.    MCALHANY,CHRISTOPHER  7/3/20137:36 AM

## 2012-05-21 NOTE — Discharge Summary (Signed)
Discharge Summary   Patient ID: Frank [name] MRN: [id number], DOB/AGE: 1956/09/30 56 y.o.  Primary MD: Darnelle Maffucci, MD Primary Cardiologist: Verne Carrow MD Admit date: 05/20/2012 D/C date:     05/21/2012      Primary Discharge Diagnoses:  1. Coronary Artery Disease  - s/p 5v CABG 2004, stent to RCA x1 and SVG to acute marginal x2 2011  - 05/20/12 s/p PTCA/DES x 2 body of SVG to PDA   Secondary Discharge Diagnoses:  1. Hypertension 2. Hyperlipidemia 3. Mitral Regurgitation, mild to mod by TEE 03/27/12 4. CHF 5. GERD 6. Arthritis  7. Chronic lower back pain  8. Depression  9. Anxiety  10. Umbilical hernia  Allergies Allergies  Allergen Reactions  . Sulfur Rash    Diagnostic Studies/Procedures:   05/20/12 - Cardiac Cath Hemodynamic Findings:  Central aortic pressure: 107/60  Left ventricular pressure: 112/13/26  Angiographic Findings:  Left main: No obstructive disease noted.  Left Anterior Descending Artery: Moderate sized vessel that courses to the apex. There is diffuse proximal 80% stenosis and 100% mid occlusion. The mid and distal vessel fills from the LIMA graft.  Circumflex Artery: Moderate sized vessel with mild plaque. The small OM branch has 80% ostial stenosis. The Ramus intermediate branch has proximal 50% stenosis which appears unchanged from last cath.  Right Coronary Artery: Small to moderate sized vessel with diffuse plaque. There is a patent stent mid vessel with 40-50% in-stent restenosis. The PDA is occluded and fills from the graft. The posterolateral segment fills antegrade. There appears to be a 80% stenosis leading into the small PL segment at the site of the old graft anastamosis. This vessel is 1.75 mm in diameter.  Graft Anatomy:  SVG to PDA is patent with stents present in the proximal/mid and distal body of vein graft. There is 70-80% stenosis in the ostium of the vein graft extending down into the stented segment. The mid stented segment  has diffuse 60-70% restenosis. The distal stented segment has 90% restenosis. Just beyond the distal stent there is a 60-70% stenosis. The SVG limb to the PLA is known to be occluded.  SVG to OM is known to be occluded and was not selectively engaged.  SVG to Diagonal is known to be occluded and was not selectively engaged.  LIMA to LAD was patent.  Left Ventricular Angiogram: LVEF 55% with moderate MR  Impression:  1. Severe triple vessel CAD s/p 5V CABG with 2/5 patent bypass grafts.  2. Severe stenosis SVG to PDA in the proximal and distal stented segments in the body of the SVG  3. Successful PTCA/DES x 2 body of SVG to PDA  4. Diffuse disease in RCA and intermediate branch  5. Preserved LV systolic function  6. Moderate MR  Recommendations: Continue current medical management including ASA and Plavix.    History of Present Illness: 56 y.o. male w/ the above medical problems who presented to [facility] on 05/20/12 for cardiac catheterization.  He was seen in clinic by Dr. Clifton James on 05/06/12 with complaints of exertional chest pressure, dyspnea, and fatigue. Given his cardiac history it was felt he needed evaluation with cardiac cath which was arranged for 05/20/12.  Hospital Course: He presented to [facility] on 05/20/12 and underwent cardiac cath revealing severe stenosis of the SVG to PDA in the proximal and distal stented segments in the body of the SVG (see cath report above for full summary). PTCA/DES x2 to the body of the SVG to  PDA was successfully performed. He tolerated the procedure well without complications. He was able to walk with cardiac rehab without complaints of chest pain or sob.  He was seen and evaluated by Dr. Clifton James who felt he was stable for discharge home with plans for follow up as scheduled below.  Discharge Vitals: Blood pressure 123/80, pulse 63, temperature 97.7 F (36.5 C), temperature source Oral, resp. rate 18, height 5\' 8"  (1.727 m), weight 177 lb  7.5 oz (80.5 kg), SpO2 97.00%.  Labs: Basic Metabolic Panel:    Component Value Date/Time   NA 137 05/21/2012 0904   K 4.3 05/21/2012 0904   CL 100 05/21/2012 0904   CO2 27 05/21/2012 0904   BUN 14 05/21/2012 0904   CREATININE 1.01 05/21/2012 0904   GLUCOSE 162* 05/21/2012 0904   CALCIUM 9.2 05/21/2012 0904   CBC:    Component Value Date/Time   WBC 6.8 05/21/2012 0904   HGB 14.3 05/21/2012 0904   HCT 42.0 05/21/2012 0904   PLT 161 05/21/2012 0904   MCV 87.5 05/21/2012 0904    Discharge Medications   Medication List  As of 05/21/2012 10:37 AM   STOP taking these medications         omeprazole 20 MG capsule         TAKE these medications         aspirin 81 MG tablet   Take 1 tablet (81 mg total) by mouth daily.      calcium carbonate 500 MG chewable tablet   Commonly known as: TUMS - dosed in mg elemental calcium   Chew 1 tablet by mouth daily.      citalopram 20 MG tablet   Commonly known as: CELEXA   Take 1 tablet (20 mg total) by mouth daily.      clopidogrel 75 MG tablet   Commonly known as: PLAVIX   Take 1 tablet (75 mg total) by mouth daily.      furosemide 20 MG tablet   Commonly known as: LASIX   Take 1 tablet (20 mg total) by mouth daily.      hydrALAZINE 10 MG tablet   Commonly known as: APRESOLINE   TAKE 1 TABLET BY MOUTH THREE TIMES DAILY      lisinopril 2.5 MG tablet   Commonly known as: PRINIVIL,ZESTRIL   Take 1 tablet (2.5 mg total) by mouth daily.      metoprolol tartrate 25 MG tablet   Commonly known as: LOPRESSOR   Take 1 tablet (25 mg total) by mouth 2 (two) times daily.      nitroGLYCERIN 0.4 MG SL tablet   Commonly known as: NITROSTAT   Place 1 tablet (0.4 mg total) under the tongue every 5 (five) minutes as needed for chest pain.      pantoprazole 40 MG tablet   Commonly known as: PROTONIX   Take 1 tablet (40 mg total) by mouth daily at 6 (six) AM.      potassium chloride 10 MEQ tablet   Commonly known as: K-DUR,KLOR-CON   Take 1 tablet (10 mEq  total) by mouth daily.      simvastatin 40 MG tablet   Commonly known as: ZOCOR   Take 1 tablet (40 mg total) by mouth at bedtime.            Disposition   Discharge Orders    Future Appointments: Provider: Department: Dept Phone: Center:   06/10/2012 11:30 AM Kathleene Hazel, MD [facility] [phone]  [facility]     Future Orders Please Complete By Expires   Diet - low sodium heart healthy      Increase activity slowly      Discharge instructions      Comments:   **PLEASE REMEMBER TO BRING ALL OF YOUR MEDICATIONS TO EACH OF YOUR FOLLOW-UP OFFICE VISITS.  * KEEP GROIN SITE CLEAN AND DRY. Call the office for any signs of bleedings, pus, swelling, increased pain, or any other concerns. * NO HEAVY LIFTING (>10lbs) OR SEXUAL ACTIVITY X 7 DAYS. * NO DRIVING X 3-5 DAYS. * NO SOAKING BATHS, HOT TUBS, POOLS, ETC., X 7 DAYS.  * Your prilosec was changed to protonix due to it's interaction with plavix.     Follow-up Information    Follow up with Verne Carrow, MD on 06/10/2012. (11:30)    Contact information:   [facility] [address] [place] [address] [phone]           Outstanding Labs/Studies:  None  Duration of Discharge Encounter: Greater than 30 minutes including physician and PA time.  Signed, Gerrod Maule PA-C 05/21/2012, 10:37 AM

## 2012-05-21 NOTE — Progress Notes (Signed)
Pt walked last evening and tolerated well. Ed completed with pt and family. Pt has been working on diet and ex. Reinforced and answered questions. Not interested in CRPII (not social person) but will walk daily on his own.  [phone] Ethelda Chick CES, ACSM

## 2012-05-21 NOTE — Discharge Summary (Signed)
See full note this am. CDM 

## 2012-05-23 ENCOUNTER — Telehealth: Payer: Self-pay | Admitting: Cardiovascular Disease

## 2012-05-23 NOTE — Telephone Encounter (Signed)
Pt was changed from omeprazole to Protonix upon discharge. I spoke with pt's family member and told her it was OK for pt to take protonix while on Plavix and that I would contact his pharmacy with this information. Pharmacy at [facility] notified.

## 2012-05-23 NOTE — Telephone Encounter (Signed)
New msg Pt called about switching protonix to another med.

## 2012-06-10 ENCOUNTER — Encounter: Payer: Self-pay | Admitting: Cardiovascular Disease

## 2012-06-10 ENCOUNTER — Ambulatory Visit (INDEPENDENT_AMBULATORY_CARE_PROVIDER_SITE_OTHER): Payer: Self-pay | Admitting: Cardiovascular Disease

## 2012-06-10 VITALS — BP 114/68 | HR 57 | Ht 68.0 in | Wt 174.0 lb

## 2012-06-10 DIAGNOSIS — I2510 Atherosclerotic heart disease of native coronary artery without angina pectoris: Secondary | ICD-10-CM

## 2012-06-10 NOTE — Patient Instructions (Signed)
Your physician wants you to follow-up in:  6 months. You will receive a reminder letter in the mail two months in advance. If you don't receive a letter, please call our office to schedule the follow-up appointment.   

## 2012-06-10 NOTE — Assessment & Plan Note (Signed)
Recent DES x 2 in the SVG to the PDA. Angina improved. Will need lifelong ASA and Plavix. Continue other cardiac meds. BP is well controlled. Lipids are well controlled. No changes today.

## 2012-06-10 NOTE — Progress Notes (Signed)
History of Present Illness: 56 yo male with a history of CAD s/p CABG 2004 and stenting in 2011, HLD, HTN, GERD and mitral regurgitation who is here today for cardiac follow up. He has been followed by Dr. Elease Hashimoto but has been non-compliant with f/u appointments per the record and he was here to see me in may 2013 for the first time. Echo 01/31/12 with normal LV size and function with at least moderate MR. He told me at the first visit that he felt fatigued and had SOB. He had no LE edema, orthopnea or chest pain. I arranged a TEE on 03/27/12 to evaluate his MR and this showed mild to moderate MR. At his f/u visit 05/06/12, he had c/o exertional chest pressure, dyspnea and fatigue. I arranged a cardiac cath on 05/20/12 and this is outlined below. There was severe disease in the SVG to the PDA and 2 DES were placed in the SVG to the PDA. The LIMA was patent but the vein grafts to the OM, Diagonal and PLA were occluded.   He is doing well post discharge. No chest pain or SOB. No complaints today.   Primary Care Physician: [facility]  Last Lipid Profile:  Lipid Panel     Component Value Date/Time   CHOL 136 01/02/2012 1049   TRIG 121 01/02/2012 1049   HDL 40 01/02/2012 1049   CHOLHDL 3.4 01/02/2012 1049   VLDL 24 01/02/2012 1049   LDLCALC 72 01/02/2012 1049     Past Medical History  Diagnosis Date  . Mitral regurgitation     mild to mod by TEE 03/27/12  . Hyperlipidemia   . GERD (gastroesophageal reflux disease)   . Hypertension   . CAD (coronary artery disease)     5V CABG in 2004; stent to RCA x1 and SVG to acute marginal x2 2011;  PTCA/DES x 2 body of SVG to PDA  05/20/12  . CHF (congestive heart failure)   . Depression   . Numbness and tingling in hands   . Numbness and tingling of both legs   . Heart murmur   . Umbilical hernia     unrepaired (05/20/12)  . Arthritis     hands, shoulders, arms  . Chronic lower back pain     "w/activity"  . Anxiety     Past  Surgical History  Procedure Date  . Tee without cardioversion 03/27/2012    Procedure: TRANSESOPHAGEAL ECHOCARDIOGRAM (TEE);  Surgeon: Laurey Morale, MD;  Location: [facility] ENDOSCOPY;  Service: Cardiovascular;  Laterality: N/A;  to be done at 1100  . Coronary angioplasty with stent placement 05/11    stent to RCA x 1 and SVG-acute marginal x2. EF normal  . Coronary angioplasty with stent placement 05/20/12    PTCA/DES x 2 body of SVG to PDA   . Coronary artery bypass graft 2004    5v CABG   . Incision and drainage of wound 2010    "from bite; maybe snake or spider; almost lost LLE"    Current Outpatient Prescriptions  Medication Sig Dispense Refill  . aspirin 81 MG tablet Take 1 tablet (81 mg total) by mouth daily.      . citalopram (CELEXA) 20 MG tablet Take 1 tablet (20 mg total) by mouth daily.  31 tablet  3  . clopidogrel (PLAVIX) 75 MG tablet Take 1 tablet (75 mg total) by mouth daily.  90 tablet  4  . furosemide (LASIX) 20 MG tablet Take 1  tablet (20 mg total) by mouth daily.  30 tablet  5  . hydrALAZINE (APRESOLINE) 10 MG tablet TAKE 1 TABLET BY MOUTH THREE TIMES DAILY  90 tablet  0  . lisinopril (PRINIVIL,ZESTRIL) 2.5 MG tablet Take 1 tablet (2.5 mg total) by mouth daily.  90 tablet  4  . metoprolol tartrate (LOPRESSOR) 25 MG tablet Take 1 tablet (25 mg total) by mouth 2 (two) times daily.  180 tablet  4  . nitroGLYCERIN (NITROSTAT) 0.4 MG SL tablet Place 1 tablet (0.4 mg total) under the tongue every 5 (five) minutes as needed for chest pain.  25 tablet  6  . pantoprazole (PROTONIX) 40 MG tablet Take 1 tablet (40 mg total) by mouth daily at 6 (six) AM.  30 tablet  2  . potassium chloride (K-DUR,KLOR-CON) 10 MEQ tablet Take 1 tablet (10 mEq total) by mouth daily.  30 tablet  5  . simvastatin (ZOCOR) 40 MG tablet Take 1 tablet (40 mg total) by mouth at bedtime.  90 tablet  4    Allergies  Allergen Reactions  . Sulfur Rash    History   Social History  . Marital Status: Legally  Separated    Spouse Name: N/A    Number of Children: 2  . Years of Education: N/A   Occupational History  . 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 Smoking status: Former Smoker -- 3.0 packs/day for 40 years    Types: Cigarettes    Quit date: 01/23/2003  . Smokeless tobacco: Never Used  . 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 Drug Use: Yes    Special: Marijuana     05/20/12 "last marijuana was 2004"  . Sexually Active: Yes   Other Topics Concern  . Not on file   Social History Narrative  . No narrative on file    Family History  Problem Relation Age of Onset  . Heart disease Mother   . Cancer Mother     ? type  . Diabetes Father   . Hyperlipidemia Father   . Hypertension Father     Review of Systems:  As stated in the HPI and otherwise negative.   BP 114/68  Pulse 57  Ht 5\' 8"  (1.727 m)  Wt 174 lb (78.926 kg)  BMI 26.46 kg/m2  Physical Examination: General: Well developed, well nourished, NAD HEENT: OP clear, mucus membranes moist SKIN: warm, dry. No rashes. Neuro: No focal deficits Musculoskeletal: Muscle strength 5/5 all ext Psychiatric: Mood and affect normal Neck: No JVD, no carotid bruits, no thyromegaly, no lymphadenopathy. Lungs:Clear bilaterally, no wheezes, rhonci, crackles Cardiovascular: Regular rate and rhythm. No murmurs, gallops or rubs. Abdomen:Soft. Bowel sounds present. Non-tender.  Extremities: No lower extremity edema. Pulses are 2 + in the bilateral DP/PT.   Cardiac cath 05/20/12: Left main: No obstructive disease noted.  Left Anterior Descending Artery: Moderate sized vessel that courses to the apex. There is diffuse proximal 80% stenosis and 100% mid occlusion. The mid and distal vessel fills from the LIMA graft.  Circumflex Artery: Moderate sized vessel with mild plaque. The small OM branch has 80% ostial stenosis. The Ramus intermediate branch has proximal 50% stenosis  which appears unchanged from last cath.  Right Coronary Artery: Small to moderate sized vessel with diffuse plaque. There is a patent stent mid vessel with 40-50% in-stent restenosis. The PDA is occluded and fills from the  graft. The posterolateral segment fills antegrade. There appears to be a 80% stenosis leading into the small PL segment at the site of the old graft anastamosis. This vessel is 1.75 mm in diameter.  Graft Anatomy:  SVG to PDA is patent with stents present in the proximal/mid and distal body of vein graft. There is 70-80% stenosis in the ostium of the vein graft extending down into the stented segment. The mid stented segment has diffuse 60-70% restenosis. The distal stented segment has 90% restenosis. Just beyond the distal stent there is a 60-70% stenosis. The SVG limb to the PLA is known to be occluded.  SVG to OM is known to be occluded and was not selectively engaged.  SVG to Diagonal is known to be occluded and was not selectively engaged.  LIMA to LAD was patent.  Left Ventricular Angiogram: LVEF 55% with moderate MR  Impression:  1. Severe triple vessel CAD s/p 5V CABG with 2/5 patent bypass grafts.  2. Severe stenosis SVG to PDA in the proximal and distal stented segments in the body of the SVG  3. Successful PTCA/DES x 2 body of SVG to PDA  4. Diffuse disease in RCA and intermediate branch  5. Preserved LV systolic function  6. Moderate MR

## 2012-06-12 ENCOUNTER — Other Ambulatory Visit: Payer: Self-pay | Admitting: *Deleted

## 2012-06-12 DIAGNOSIS — F32A Depression, unspecified: Secondary | ICD-10-CM

## 2012-06-12 DIAGNOSIS — F329 Major depressive disorder, single episode, unspecified: Secondary | ICD-10-CM

## 2012-06-12 MED ORDER — CITALOPRAM HYDROBROMIDE 20 MG PO TABS: 20.0000 mg | ORAL_TABLET | Freq: Every day | ORAL | Status: DC

## 2012-06-15 ENCOUNTER — Other Ambulatory Visit: Payer: Self-pay | Admitting: Internal Medicine

## 2012-06-15 DIAGNOSIS — F329 Major depressive disorder, single episode, unspecified: Secondary | ICD-10-CM

## 2012-06-15 DIAGNOSIS — F32A Depression, unspecified: Secondary | ICD-10-CM

## 2012-07-12 ENCOUNTER — Other Ambulatory Visit: Payer: Self-pay | Admitting: Cardiovascular Disease

## 2012-07-14 ENCOUNTER — Other Ambulatory Visit: Payer: Self-pay | Admitting: Cardiovascular Disease

## 2012-07-14 MED ORDER — FUROSEMIDE 20 MG PO TABS: 20.0000 mg | ORAL_TABLET | Freq: Every day | ORAL | Status: DC

## 2012-07-15 ENCOUNTER — Other Ambulatory Visit (HOSPITAL_COMMUNITY): Payer: Self-pay | Admitting: Cardiology

## 2012-08-29 ENCOUNTER — Other Ambulatory Visit: Payer: Self-pay | Admitting: Internal Medicine

## 2012-10-08 ENCOUNTER — Other Ambulatory Visit: Payer: Self-pay | Admitting: *Deleted

## 2012-10-08 DIAGNOSIS — F32A Depression, unspecified: Secondary | ICD-10-CM

## 2012-10-08 DIAGNOSIS — F329 Major depressive disorder, single episode, unspecified: Secondary | ICD-10-CM

## 2012-10-08 MED ORDER — CITALOPRAM HYDROBROMIDE 20 MG PO TABS: 20.0000 mg | ORAL_TABLET | Freq: Every day | ORAL | Status: DC

## 2012-10-08 NOTE — Telephone Encounter (Signed)
Fax from the pharmacy requesting a 90 day supply

## 2012-10-31 ENCOUNTER — Encounter: Payer: Self-pay | Admitting: Internal Medicine

## 2012-10-31 ENCOUNTER — Other Ambulatory Visit: Payer: Self-pay | Admitting: *Deleted

## 2012-10-31 ENCOUNTER — Other Ambulatory Visit (HOSPITAL_COMMUNITY): Payer: Self-pay | Admitting: Cardiovascular Disease

## 2012-10-31 ENCOUNTER — Ambulatory Visit (INDEPENDENT_AMBULATORY_CARE_PROVIDER_SITE_OTHER): Payer: Self-pay | Admitting: Internal Medicine

## 2012-10-31 VITALS — BP 116/69 | HR 61 | Temp 97.2°F | Wt 180.4 lb

## 2012-10-31 DIAGNOSIS — I10 Essential (primary) hypertension: Secondary | ICD-10-CM

## 2012-10-31 DIAGNOSIS — Z1211 Encounter for screening for malignant neoplasm of colon: Secondary | ICD-10-CM

## 2012-10-31 DIAGNOSIS — R0609 Other forms of dyspnea: Secondary | ICD-10-CM

## 2012-10-31 DIAGNOSIS — L570 Actinic keratosis: Secondary | ICD-10-CM

## 2012-10-31 DIAGNOSIS — I2510 Atherosclerotic heart disease of native coronary artery without angina pectoris: Secondary | ICD-10-CM

## 2012-10-31 DIAGNOSIS — R0683 Snoring: Secondary | ICD-10-CM

## 2012-10-31 DIAGNOSIS — R0989 Other specified symptoms and signs involving the circulatory and respiratory systems: Secondary | ICD-10-CM

## 2012-10-31 DIAGNOSIS — E785 Hyperlipidemia, unspecified: Secondary | ICD-10-CM

## 2012-10-31 DIAGNOSIS — Z23 Encounter for immunization: Secondary | ICD-10-CM

## 2012-10-31 DIAGNOSIS — R011 Cardiac murmur, unspecified: Secondary | ICD-10-CM

## 2012-10-31 LAB — BASIC METABOLIC PANEL WITH GFR
BUN: 11 mg/dL (ref 6–23)
CO2: 27 meq/L (ref 19–32)
Calcium: 9 mg/dL (ref 8.4–10.5)
Chloride: 102 meq/L (ref 96–112)
Creat: 0.97 mg/dL (ref 0.50–1.35)
Glucose, Bld: 83 mg/dL (ref 70–99)
Potassium: 4.1 meq/L (ref 3.5–5.3)
Sodium: 138 meq/L (ref 135–145)

## 2012-10-31 MED ORDER — POTASSIUM CHLORIDE CRYS ER 10 MEQ PO TBCR: 10.0000 meq | EXTENDED_RELEASE_TABLET | Freq: Every day | ORAL | Status: DC

## 2012-10-31 NOTE — Telephone Encounter (Signed)
Pt needs appointment then refill can be made Fax Received. Refill Completed. Haydn Cush Chowoe (R.M.A)   

## 2012-10-31 NOTE — Assessment & Plan Note (Signed)
Symptoms consistent with stable angina. He is followed by cardiology. He is on dual platelet therapy, beta-blockade, ACE inhibitor therapy, and statin therapy.

## 2012-10-31 NOTE — Telephone Encounter (Signed)
Opened in Error.

## 2012-10-31 NOTE — Assessment & Plan Note (Signed)
Fatigue with a normal sleep cycle and a report of snoring by his wife is concerning for sleep apnea. Once his Medicaid issues have been sorted out we will refer him for nocturnal polysomnography.

## 2012-10-31 NOTE — Assessment & Plan Note (Signed)
Excellent control. His LDL was 72 this past February. He is on moderate intensity statin therapy with simvastatin 40 mg daily. No changes today.  Lipid Panel     Component Value Date/Time   CHOL 136 01/02/2012 1049   TRIG 121 01/02/2012 1049   HDL 40 01/02/2012 1049   CHOLHDL 3.4 01/02/2012 1049   VLDL 24 01/02/2012 1049   LDLCALC 72 01/02/2012 1049

## 2012-10-31 NOTE — Progress Notes (Signed)
  Subjective:    Patient ID: Frank [name], male    DOB: 07/29/1956, 56 y.o.   MRN: [id number]  HPI: This is a 56 year old man with mitral regurgitation, coronary artery disease, hypertension, and hyperlipidemia; presenting for followup today. He was admitted in July by cardiology and had PCI. He has since followed up with cardiology and is stable from a coronary artery disease standpoint. He is significantly limited by exertional dyspnea. He can walk a good distance on flat surface without dyspnea, but when he walks up with miniload or against any grade, he becomes dyspneic with minimal distance. He also complains of rarer anginal type chest pain is relieved quickly with rest. These chest pains or not increasing in frequency or intensity. He does report unexpected weight gain over the past few months despite attempts at dietary changes. He denies orthopnea, paroxysmal nocturnal dyspnea, and lower extremity edema.  Unrelated to his heart disease, he complains of a skin lesion on his right temple that has been present for several months now. He is concerned about skin cancer as he has had a lesion removed from his left nose some years back. He also has a family history of skin cancer. The lesion is occasionally tender but not pruritic. He also has a cystlike lesion on his right foot that has been drained by needle aspiration several times in the past.   Review of Systems  Constitutional: Positive for fatigue and unexpected weight change (Weight gain). Negative for fever, chills and appetite change.  HENT: Positive for congestion.   Respiratory: Positive for shortness of breath (With exertion). Negative for cough.   Cardiovascular: Positive for chest pain (Rarely, relieved with). Negative for palpitations and leg swelling.  Gastrointestinal: Negative.  Negative for abdominal pain, diarrhea and constipation.  Skin: Positive for rash (Right temple).  Neurological: Positive for headaches (Bilateral,  tension-type). Negative for dizziness and light-headedness.       Objective:   Physical Exam GENERAL: well developed, well nourished; no acute distress HEAD: atraumatic, normocephalic NECK: supple, no carotid bruits LUNGS: clear to auscultation bilaterally, normal work of breathing HEART: normal rate and regular rhythm; normal S1 and S2 without S3 or S4; no murmurs, rubs, or clicks PULSES: radial and pedal 2+ and symmetric ABDOMEN: distended but not rigid, non-tender, normal bowel sounds, no masses or hepatosplenomegaly SKIN: warm, dry, intact, normal turgor, excoriation on right temple consistent with actinic keratosis EXTREMITIES: no peripheral edema; no clubbing; cyst like fluid collection along the right second metatarsal consistent with ganglion cyst, this is nonerythematous and nontender  Filed Vitals:   10/31/12 1319  BP: 116/69  Pulse: 61  Temp: 97.2 F (36.2 C)    BP Readings from Last 3 Encounters:  10/31/12 116/69  06/10/12 114/68  05/21/12 123/80         Assessment & Plan:

## 2012-10-31 NOTE — Assessment & Plan Note (Addendum)
BP Readings from Last 3 Encounters:  10/31/12 116/69  06/10/12 114/68  05/21/12 123/80    Lab Results  Component Value Date   NA 137 05/21/2012   K 4.3 05/21/2012   CREATININE 1.01 05/21/2012    Assessment:  Blood pressure control: controlled  Progress toward BP goal:  at goal  Comments: Excellent control.  Plan:  Medications: Furosemide 20 mg daily, hydralazine 10 mg 3 times a day, lisinopril 2.5 mg daily, and metoprolol tartrate 25 mg twice a day. No changes today.  Educational resources provided: brochure  Self management tools provided:    Other plans: Basic metabolic panel today  ADDENDUM: Electrolytes and renal function normal. BMET Component Value Date/Time   NA 138 10/31/2012 1417   K 4.1 10/31/2012 1417   CL 102 10/31/2012 1417   CO2 27 10/31/2012 1417   GLUCOSE 83 10/31/2012 1417   BUN 11 10/31/2012 1417   CREATININE 0.97 10/31/2012 1417   CALCIUM 9.0 10/31/2012 1417

## 2012-10-31 NOTE — Assessment & Plan Note (Signed)
Exam consistent with actinic keratosis. We will refer to dermatology once his Medicaid issues have been sorted out.

## 2012-10-31 NOTE — Patient Instructions (Addendum)
General Instructions:  Referral for sleep study Referral for screening colonoscopy Referral for dermatology  Keep up the good work!    Treatment Goals:  Goals (1 Years of Data) as of 10/31/2012          As of Today 06/10/12 05/21/12 05/21/12 05/21/12     Blood Pressure    . Blood Pressure < 130/80  116/69 114/68 123/80 123/80 106/70     Result Component    . LDL CALC < 130            Progress Toward Treatment Goals:  Treatment Goal 10/31/2012  Blood pressure at goal  LDL at goal    Self Care Goals & Plans:  Self Care Goal 10/31/2012  Manage my medications bring my medications to every visit; take my medicines as prescribed  Monitor my health keep track of my weight  Eat healthy foods eat smaller portions; eat foods that are low in salt; eat baked foods instead of fried foods  Be physically active find an activity I enjoy       Care Management & Community Referrals:  Referral 10/31/2012  Referrals made for care management support none needed

## 2012-11-21 ENCOUNTER — Telehealth: Payer: Self-pay | Admitting: *Deleted

## 2012-11-21 NOTE — Telephone Encounter (Signed)
SPOKE WITH MR Batta THIS MORNING CONCERNING HIS REFERRAL AND MEDICAID. WAS UNABLE TO MAKE REFERRAL WITH [facility] IN DEC. 013, PER OFFICE MR Metayer'S MEDICAID CARD WAS INACTIVE, PATIENT WAS INSTRUCTED TO GET IN CONTACT WITH HIS CASE WORKER.  PER MR Lemarr, HE WAS TOLD THAT HE HAD TO PAY $1,000 DEDUCTABLE BEFORE HE CAN USE HIS MEDICAID CARD. I TOLD HIM AGAIN TO GET IN CONTACT WITH HIS CASE WORKER TO GET THIS MATTER FIXED. I NEVER HEARD OF ANYONE HAVING TO PAY A DEDUCTABLE BEFORE THEY COULD USE THEIR CARD.  LELA STURDIVANT  NTII 1-23-014  11:12PM

## 2012-12-01 ENCOUNTER — Encounter (HOSPITAL_BASED_OUTPATIENT_CLINIC_OR_DEPARTMENT_OTHER): Payer: Self-pay

## 2012-12-03 ENCOUNTER — Telehealth: Payer: Self-pay

## 2012-12-03 NOTE — Telephone Encounter (Signed)
[facility] stating patient cancelled appointment on 12/01/2012 for Sleep Study due to insurance.

## 2012-12-05 ENCOUNTER — Other Ambulatory Visit: Payer: Self-pay | Admitting: Internal Medicine

## 2012-12-11 ENCOUNTER — Ambulatory Visit: Payer: Self-pay

## 2012-12-11 ENCOUNTER — Ambulatory Visit (INDEPENDENT_AMBULATORY_CARE_PROVIDER_SITE_OTHER): Payer: Self-pay | Admitting: Cardiovascular Disease

## 2012-12-11 ENCOUNTER — Encounter: Payer: Self-pay | Admitting: Cardiovascular Disease

## 2012-12-11 VITALS — BP 115/71 | HR 61 | Ht 68.0 in | Wt 180.0 lb

## 2012-12-11 DIAGNOSIS — I25810 Atherosclerosis of coronary artery bypass graft(s) without angina pectoris: Secondary | ICD-10-CM

## 2012-12-11 NOTE — Patient Instructions (Addendum)
Your physician wants you to follow-up in:  6 months. You will receive a reminder letter in the mail two months in advance. If you don't receive a letter, please call our office to schedule the follow-up appointment.   

## 2012-12-11 NOTE — Addendum Note (Signed)
Addended by: Neomia Dear on: 12/11/2012 07:44 PM   Modules accepted: Orders

## 2012-12-11 NOTE — Progress Notes (Signed)
History of Present Illness: 57 yo male with a history of CAD s/p CABG 2004 and stenting in 2011, HLD, HTN, GERD and mitral regurgitation who is here today for cardiac follow up. He had been followed by [facility] but was non-compliant with f/u appointments and I met him in May 2013 for the first time. Echo 01/31/12 with normal LV size and function with at least moderate MR. He told me at the first visit that he felt fatigued and had SOB. He had no LE edema, orthopnea or chest pain. I arranged a TEE on 03/27/12 to evaluate his MR and this showed mild to moderate MR. At his f/u visit 05/06/12, he had c/o exertional chest pressure, dyspnea and fatigue. I arranged a cardiac cath on 05/20/12. There was severe disease in the SVG to the PDA and 2 DES were placed in the SVG to the PDA. The LIMA was patent but the vein grafts to the OM, Diagonal and PLA were occluded. He was last seen in our office in July 2013.  He is here today for follow up. No chest pain or SOB. No complaints today. He has not been using his NTG. He has not been exercising.   Primary Care Physician: [facility]  Last Lipid Profile:Lipid Panel     Component Value Date/Time   CHOL 136 01/02/2012 1049   TRIG 121 01/02/2012 1049   HDL 40 01/02/2012 1049   CHOLHDL 3.4 01/02/2012 1049   VLDL 24 01/02/2012 1049   LDLCALC 72 01/02/2012 1049    Past Medical History  Diagnosis Date  . Mitral regurgitation     mild to mod by TEE 03/27/12  . Hyperlipidemia   . GERD (gastroesophageal reflux disease)   . Hypertension   . CAD (coronary artery disease)     5V CABG in 2004; stent to RCA x1 and SVG to acute marginal x2 2011;  PTCA/DES x 2 body of SVG to PDA  05/20/12  . CHF (congestive heart failure)   . Depression   . Numbness and tingling in hands   . Numbness and tingling of both legs   . Heart murmur   . Umbilical hernia     unrepaired (05/20/12)  . Arthritis     hands, shoulders, arms  . Chronic lower back pain    "w/activity"  . Anxiety     Past Surgical History  Procedure Date  . Tee without cardioversion 03/27/2012    Procedure: TRANSESOPHAGEAL ECHOCARDIOGRAM (TEE);  Surgeon: Laurey Morale, MD;  Location: [facility] ENDOSCOPY;  Service: Cardiovascular;  Laterality: N/A;  to be done at 1100  . Coronary angioplasty with stent placement 05/11    stent to RCA x 1 and SVG-acute marginal x2. EF normal  . Coronary angioplasty with stent placement 05/20/12    PTCA/DES x 2 body of SVG to PDA   . Coronary artery bypass graft 2004    5v CABG   . Incision and drainage of wound 2010    "from bite; maybe snake or spider; almost lost LLE"    Current Outpatient Prescriptions  Medication Sig Dispense Refill  . aspirin 81 MG tablet Take 1 tablet (81 mg total) by mouth daily.      . citalopram (CELEXA) 20 MG tablet Take 1 tablet (20 mg total) by mouth daily.  90 tablet  0  . clopidogrel (PLAVIX) 75 MG tablet Take 1 tablet (75 mg total) by mouth daily.  90 tablet  4  . furosemide (LASIX) 20  MG tablet Take 1 tablet (20 mg total) by mouth daily.  90 tablet  3  . hydrALAZINE (APRESOLINE) 10 MG tablet TAKE 1 TABLET BY MOUTH THREE TIMES DAILY  90 tablet  1  . lisinopril (PRINIVIL,ZESTRIL) 2.5 MG tablet Take 1 tablet (2.5 mg total) by mouth daily.  90 tablet  4  . metoprolol tartrate (LOPRESSOR) 25 MG tablet TAKE 1 TABLET BY MOUTH TWICE DAILY  180 tablet  3  . nitroGLYCERIN (NITROSTAT) 0.4 MG SL tablet Place 1 tablet (0.4 mg total) under the tongue every 5 (five) minutes as needed for chest pain.  25 tablet  6  . pantoprazole (PROTONIX) 40 MG tablet TAKE 1 TABLET BY MOUTH DAILY AT 6:00 IN THE MORNING  90 tablet  1  . potassium chloride (K-DUR,KLOR-CON) 10 MEQ tablet Take 1 tablet (10 mEq total) by mouth daily.  30 tablet  3  . simvastatin (ZOCOR) 40 MG tablet Take 1 tablet (40 mg total) by mouth at bedtime.  90 tablet  4    Allergies  Allergen Reactions  . Sulfur Rash    History   Social History  . Marital Status:  Legally Separated    Spouse Name: N/A    Number of Children: 2  . Years of Education: N/A   Occupational History  . 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 Smoking status: Former Smoker -- 3.0 packs/day for 40 years    Types: Cigarettes    Quit date: 01/23/2003  . Smokeless tobacco: Never Used  . 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 Drug Use: Yes    Special: Marijuana     Comment: 05/20/12 "last marijuana was 2004"  . Sexually Active: Yes   Other Topics Concern  . Not on file   Social History Narrative  . No narrative on file    Family History  Problem Relation Age of Onset  . Heart disease Mother   . Cancer Mother     ? type  . Diabetes Father   . Hyperlipidemia Father   . Hypertension Father     Review of Systems:  As stated in the HPI and otherwise negative.   BP 115/71  Pulse 61  Ht 5\' 8"  (1.727 m)  Wt 180 lb (81.647 kg)  BMI 27.37 kg/m2  SpO2 97%  Physical Examination: General: Well developed, well nourished, NAD HEENT: OP clear, mucus membranes moist SKIN: warm, dry. No rashes. Neuro: No focal deficits Musculoskeletal: Muscle strength 5/5 all ext Psychiatric: Mood and affect normal Neck: No JVD, no carotid bruits, no thyromegaly, no lymphadenopathy. Lungs:Clear bilaterally, no wheezes, rhonci, crackles Cardiovascular: Regular rate and rhythm. No murmurs, gallops or rubs. Abdomen:Soft. Bowel sounds present. Non-tender.  Extremities: No lower extremity edema. Pulses are 2 + in the bilateral DP/PT.  Cardiac cath 05/20/12:  Left main: No obstructive disease noted.  Left Anterior Descending Artery: Moderate sized vessel that courses to the apex. There is diffuse proximal 80% stenosis and 100% mid occlusion. The mid and distal vessel fills from the LIMA graft.  Circumflex Artery: Moderate sized vessel with mild plaque. The small OM branch has 80% ostial stenosis. The Ramus  intermediate branch has proximal 50% stenosis which appears unchanged from last cath.  Right Coronary Artery: Small to moderate sized vessel with diffuse plaque. There is a patent stent mid vessel with 40-50% in-stent restenosis. The PDA is occluded and fills  from the graft. The posterolateral segment fills antegrade. There appears to be a 80% stenosis leading into the small PL segment at the site of the old graft anastamosis. This vessel is 1.75 mm in diameter.  Graft Anatomy:  SVG to PDA is patent with stents present in the proximal/mid and distal body of vein graft. There is 70-80% stenosis in the ostium of the vein graft extending down into the stented segment. The mid stented segment has diffuse 60-70% restenosis. The distal stented segment has 90% restenosis. Just beyond the distal stent there is a 60-70% stenosis. The SVG limb to the PLA is known to be occluded.  SVG to OM is known to be occluded and was not selectively engaged.  SVG to Diagonal is known to be occluded and was not selectively engaged.  LIMA to LAD was patent.  Left Ventricular Angiogram: LVEF 55% with moderate MR  Impression:  1. Severe triple vessel CAD s/p 5V CABG with 2/5 patent bypass grafts.  2. Severe stenosis SVG to PDA in the proximal and distal stented segments in the body of the SVG  3. Successful PTCA/DES x 2 body of SVG to PDA  4. Diffuse disease in RCA and intermediate branch  5. Preserved LV systolic function  6. Moderate MR   Assessment and Plan:   1. CAD: DES x 2 in the SVG to the PDA in 05/20/12. Will need lifelong ASA and Plavix. Continue other cardiac meds. BP is well controlled. Lipids are well controlled. No changes today. Will check lipids and LFTs at next visit.

## 2013-01-02 ENCOUNTER — Other Ambulatory Visit: Payer: Self-pay | Admitting: Internal Medicine

## 2013-01-20 ENCOUNTER — Other Ambulatory Visit: Payer: Self-pay | Admitting: *Deleted

## 2013-01-20 MED ORDER — HYDRALAZINE HCL 10 MG PO TABS: 10.0000 mg | ORAL_TABLET | Freq: Three times a day (TID) | ORAL | Status: DC

## 2013-01-28 ENCOUNTER — Encounter: Payer: Self-pay | Admitting: Internal Medicine

## 2013-02-13 ENCOUNTER — Other Ambulatory Visit: Payer: Self-pay | Admitting: *Deleted

## 2013-02-13 DIAGNOSIS — I2510 Atherosclerotic heart disease of native coronary artery without angina pectoris: Secondary | ICD-10-CM

## 2013-02-13 MED ORDER — CLOPIDOGREL BISULFATE 75 MG PO TABS: 75.0000 mg | ORAL_TABLET | Freq: Every day | ORAL | Status: DC

## 2013-02-24 ENCOUNTER — Other Ambulatory Visit: Payer: Self-pay | Admitting: *Deleted

## 2013-02-24 DIAGNOSIS — F329 Major depressive disorder, single episode, unspecified: Secondary | ICD-10-CM

## 2013-02-24 DIAGNOSIS — F32A Depression, unspecified: Secondary | ICD-10-CM

## 2013-02-24 MED ORDER — CITALOPRAM HYDROBROMIDE 20 MG PO TABS: 20.0000 mg | ORAL_TABLET | Freq: Every day | ORAL | Status: DC

## 2013-02-27 ENCOUNTER — Other Ambulatory Visit: Payer: Self-pay | Admitting: *Deleted

## 2013-02-28 MED ORDER — LISINOPRIL 2.5 MG PO TABS: 2.5000 mg | ORAL_TABLET | Freq: Every day | ORAL | Status: DC

## 2013-02-28 MED ORDER — HYDRALAZINE HCL 10 MG PO TABS: 10.0000 mg | ORAL_TABLET | Freq: Three times a day (TID) | ORAL | Status: DC

## 2013-03-25 ENCOUNTER — Other Ambulatory Visit: Payer: Self-pay | Admitting: *Deleted

## 2013-03-25 MED ORDER — SIMVASTATIN 40 MG PO TABS: ORAL_TABLET | ORAL | Status: DC

## 2013-05-25 ENCOUNTER — Ambulatory Visit (INDEPENDENT_AMBULATORY_CARE_PROVIDER_SITE_OTHER): Payer: Medicare Other | Admitting: Cardiovascular Disease

## 2013-05-25 ENCOUNTER — Encounter: Payer: Self-pay | Admitting: Cardiovascular Disease

## 2013-05-25 VITALS — BP 122/70 | HR 55 | Ht 68.0 in | Wt 172.4 lb

## 2013-05-25 DIAGNOSIS — I2510 Atherosclerotic heart disease of native coronary artery without angina pectoris: Secondary | ICD-10-CM

## 2013-05-25 MED ORDER — NITROGLYCERIN 0.4 MG SL SUBL: 0.4000 mg | SUBLINGUAL_TABLET | SUBLINGUAL | Status: DC | PRN

## 2013-05-25 MED ORDER — SIMVASTATIN 40 MG PO TABS: ORAL_TABLET | ORAL | Status: DC

## 2013-05-25 NOTE — Progress Notes (Signed)
History of Present Illness: 57 yo male with a history of CAD s/p CABG 2004 and stenting in 2011, HLD, HTN, GERD and mitral regurgitation who is here today for cardiac follow up. He had been followed by [facility] but was non-compliant with f/u appointments and I met him in May 2013 for the first time. Echo 01/31/12 with normal LV size and function with at least moderate MR. He told me at the first visit that he felt fatigued and had SOB. He had no LE edema, orthopnea or chest pain. I arranged a TEE on 03/27/12 to evaluate his MR and this showed mild to moderate MR. At his f/u visit 05/06/12, he had c/o exertional chest pressure, dyspnea and fatigue. I arranged a cardiac cath on 05/20/12. There was severe disease in the SVG to the PDA and 2 DES were placed in the SVG to the PDA. The LIMA was patent but the vein grafts to the OM, Diagonal and PLA were occluded. He was last seen in our office in January  2014.   He is here today for follow up. No chest pain or SOB. No complaints today. He has not been using his NTG. He has not been exercising.   Primary Care Physician: [facility]  Last Lipid Profile:Lipid Panel     Component Value Date/Time   CHOL 136 01/02/2012 1049   TRIG 121 01/02/2012 1049   HDL 40 01/02/2012 1049   CHOLHDL 3.4 01/02/2012 1049   VLDL 24 01/02/2012 1049   LDLCALC 72 01/02/2012 1049     Past Medical History  Diagnosis Date  . Mitral regurgitation     Moderate by TEE 03/27/12  . Low HDL (under 40)   . GERD (gastroesophageal reflux disease)   . Hypertension   . CAD (coronary artery disease)     5V CABG in 2004; stent to RCA x1 and SVG to acute marginal x2 2011;  PTCA/DES x 2 body of SVG to PDA  05/20/12  . CHF (congestive heart failure)      EF 55% by echo 03/27/2012  . Depression   . Numbness and tingling in hands   . Numbness and tingling of both legs   . Umbilical hernia     unrepaired (05/20/12)  . Arthritis     hands, shoulders, arms  . Chronic lower  back pain     "w/activity"  . Anxiety     Past Surgical History  Procedure Laterality Date  . Tee without cardioversion  03/27/2012    Procedure: TRANSESOPHAGEAL ECHOCARDIOGRAM (TEE);  Surgeon: Laurey Morale, MD;  Location: [facility] ENDOSCOPY;  Service: Cardiovascular;  Laterality: N/A;  to be done at 1100  . Coronary angioplasty with stent placement  05/11    stent to RCA x 1 and SVG-acute marginal x2. EF normal  . Coronary angioplasty with stent placement  05/20/12    PTCA/DES x 2 body of SVG to PDA   . Coronary artery bypass graft  2004    5v CABG   . Incision and drainage of wound  2010    "from bite; maybe snake or spider; almost lost LLE"    Current Outpatient Prescriptions  Medication Sig Dispense Refill  . aspirin 81 MG tablet Take 1 tablet (81 mg total) by mouth daily.      . citalopram (CELEXA) 20 MG tablet Take 1 tablet (20 mg total) by mouth daily.  90 tablet  3  . clopidogrel (PLAVIX) 75 MG tablet Take 1 tablet (  75 mg total) by mouth daily.  90 tablet  3  . furosemide (LASIX) 20 MG tablet Take 1 tablet (20 mg total) by mouth daily.  90 tablet  3  . hydrALAZINE (APRESOLINE) 10 MG tablet Take 1 tablet (10 mg total) by mouth 3 (three) times daily.  90 tablet  3  . lisinopril (PRINIVIL,ZESTRIL) 2.5 MG tablet Take 1 tablet (2.5 mg total) by mouth daily.  90 tablet  3  . metoprolol tartrate (LOPRESSOR) 25 MG tablet TAKE 1 TABLET BY MOUTH TWICE DAILY  180 tablet  3  . pantoprazole (PROTONIX) 40 MG tablet TAKE 1 TABLET BY MOUTH DAILY AT 6:00 IN THE MORNING  90 tablet  1  . potassium chloride (K-DUR,KLOR-CON) 10 MEQ tablet Take 1 tablet (10 mEq total) by mouth daily.  30 tablet  3  . simvastatin (ZOCOR) 40 MG tablet TAKE 1 TABLET BY MOUTH AT BEDTIME  90 tablet  3  . nitroGLYCERIN (NITROSTAT) 0.4 MG SL tablet Place 1 tablet (0.4 mg total) under the tongue every 5 (five) minutes as needed for chest pain.  25 tablet  6   No current facility-administered medications for this visit.     Allergies  Allergen Reactions  . Sulfur Rash    History   Social History  . Marital Status: Legally Separated    Spouse Name: N/A    Number of Children: 2  . Years of Education: N/A   Occupational History  . 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 Smoking status: Former Smoker -- 3.00 packs/day for 40 years    Types: Cigarettes    Quit date: 01/23/2003  . Smokeless tobacco: Never Used  . 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 Drug Use: Yes    Special: Marijuana     Comment: 05/20/12 "last marijuana was 2004"  . Sexually Active: Yes   Other Topics Concern  . Not on file   Social History Narrative  . No narrative on file    Family History  Problem Relation Age of Onset  . Heart disease Mother   . Cancer Mother     ? type  . Diabetes Father   . Hyperlipidemia Father   . Hypertension Father     Review of Systems:  As stated in the HPI and otherwise negative.   BP 122/70  Pulse 55  Ht 5\' 8"  (1.727 m)  Wt 172 lb 6.4 oz (78.2 kg)  BMI 26.22 kg/m2  Physical Examination: General: Well developed, well nourished, NAD HEENT: OP clear, mucus membranes moist SKIN: warm, dry. No rashes. Neuro: No focal deficits Musculoskeletal: Muscle strength 5/5 all ext Psychiatric: Mood and affect normal Neck: No JVD, no carotid bruits, no thyromegaly, no lymphadenopathy. Lungs:Clear bilaterally, no wheezes, rhonci, crackles Cardiovascular: Regular rate and rhythm. No murmurs, gallops or rubs. Abdomen:Soft. Bowel sounds present. Non-tender.  Extremities: No lower extremity edema. Pulses are 2 + in the bilateral DP/PT.  EKG: Sinus brady, rate 55 bpm.   Cardiac cath 05/20/12:  Left main: No obstructive disease noted.  Left Anterior Descending Artery: Moderate sized vessel that courses to the apex. There is diffuse proximal 80% stenosis and 100% mid occlusion. The mid and distal vessel fills from the  LIMA graft.  Circumflex Artery: Moderate sized vessel with mild plaque. The small OM branch has 80% ostial stenosis. The Ramus intermediate branch has proximal 50% stenosis which appears  unchanged from last cath.  Right Coronary Artery: Small to moderate sized vessel with diffuse plaque. There is a patent stent mid vessel with 40-50% in-stent restenosis. The PDA is occluded and fills from the graft. The posterolateral segment fills antegrade. There appears to be a 80% stenosis leading into the small PL segment at the site of the old graft anastamosis. This vessel is 1.75 mm in diameter.  Graft Anatomy:  SVG to PDA is patent with stents present in the proximal/mid and distal body of vein graft. There is 70-80% stenosis in the ostium of the vein graft extending down into the stented segment. The mid stented segment has diffuse 60-70% restenosis. The distal stented segment has 90% restenosis. Just beyond the distal stent there is a 60-70% stenosis. The SVG limb to the PLA is known to be occluded.  SVG to OM is known to be occluded and was not selectively engaged.  SVG to Diagonal is known to be occluded and was not selectively engaged.  LIMA to LAD was patent.  Left Ventricular Angiogram: LVEF 55% with moderate MR  Impression:  1. Severe triple vessel CAD s/p 5V CABG with 2/5 patent bypass grafts.  2. Severe stenosis SVG to PDA in the proximal and distal stented segments in the body of the SVG  3. Successful PTCA/DES x 2 body of SVG to PDA  4. Diffuse disease in RCA and intermediate branch  5. Preserved LV systolic function  6. Moderate MR   Assessment and Plan:   1. CAD: DES x 2 in the SVG to the PDA in 05/20/12. Will need lifelong ASA and Plavix. Continue other cardiac meds. BP is well controlled. Lipids are well controlled. Will repeat lipids and LFTs this summer. He will change zocor to 40 mg every other day and see if his cramps resolve.   2. Muscle cramps: Good distal pulses. Cramps at rest.  Likely related to statin. Will change Zocor to 40 mg every other day.

## 2013-05-25 NOTE — Patient Instructions (Addendum)
Your physician wants you to follow-up in:  6 months.  You will receive a reminder letter in the mail two months in advance. If you don't receive a letter, please call our office to schedule the follow-up appointment.  Your physician recommends that you return for fasting lab work in: August 2014.--Lipid and Liver Profile. The lab opens at 7:30 AM every week day.  Your physician has recommended you make the following change in your medication:  Change simvastatin to 40 mg by mouth every other day

## 2013-06-25 ENCOUNTER — Other Ambulatory Visit: Payer: Medicare Other

## 2013-06-26 ENCOUNTER — Telehealth: Payer: Self-pay | Admitting: Cardiovascular Disease

## 2013-06-26 NOTE — Telephone Encounter (Signed)
New Prob   Pt states the he is supposed to have fasting labs// wants to do it in [address] instead of [address]// please assist.

## 2013-06-26 NOTE — Telephone Encounter (Deleted)
Error

## 2013-06-26 NOTE — Telephone Encounter (Signed)
Left message for pt to call back to let us know what day he will go for lab work - orders need to be faxed to [facility] [phone] for a fasting lipid and hepatic panel 272.0 [phone].

## 2013-06-29 NOTE — Telephone Encounter (Signed)
Spoke with pt's wife and pt will have labs drawn on July 01, 2013. Will fax order

## 2013-07-01 ENCOUNTER — Other Ambulatory Visit: Payer: Self-pay | Admitting: Cardiovascular Disease

## 2013-07-01 LAB — HEPATIC FUNCTION PANEL
ALT: 17 U/L (ref 0–53)
Indirect Bilirubin: 0.3 mg/dL (ref 0.0–0.9)
Total Protein: 7.2 g/dL (ref 6.0–8.3)

## 2013-07-01 LAB — LIPID PANEL
Cholesterol: 160 mg/dL (ref 0–200)
HDL: 38 mg/dL — ABNORMAL LOW (ref >39)
Total CHOL/HDL Ratio: 4.2 Ratio
Triglycerides: 178 mg/dL — ABNORMAL HIGH (ref <150)
VLDL: 36 mg/dL (ref 0–40)

## 2013-07-02 ENCOUNTER — Encounter: Payer: Self-pay | Admitting: Cardiovascular Disease

## 2013-08-25 ENCOUNTER — Other Ambulatory Visit: Payer: Self-pay | Admitting: *Deleted

## 2013-08-25 MED ORDER — PANTOPRAZOLE SODIUM 40 MG PO TBEC: 40.0000 mg | DELAYED_RELEASE_TABLET | ORAL | Status: DC

## 2013-08-27 ENCOUNTER — Other Ambulatory Visit: Payer: Self-pay | Admitting: *Deleted

## 2013-08-27 DIAGNOSIS — R011 Cardiac murmur, unspecified: Secondary | ICD-10-CM

## 2013-08-27 DIAGNOSIS — I10 Essential (primary) hypertension: Secondary | ICD-10-CM

## 2013-08-27 MED ORDER — POTASSIUM CHLORIDE CRYS ER 10 MEQ PO TBCR: 10.0000 meq | EXTENDED_RELEASE_TABLET | Freq: Every day | ORAL | Status: DC

## 2013-09-23 ENCOUNTER — Encounter: Payer: Self-pay | Admitting: Physician Assistant

## 2013-09-23 ENCOUNTER — Encounter: Payer: Self-pay | Admitting: Family Medicine

## 2013-09-23 ENCOUNTER — Ambulatory Visit (INDEPENDENT_AMBULATORY_CARE_PROVIDER_SITE_OTHER): Payer: Medicare Other | Admitting: Family Medicine

## 2013-09-23 VITALS — BP 120/79 | HR 48 | Ht 68.0 in | Wt 176.0 lb

## 2013-09-23 DIAGNOSIS — F329 Major depressive disorder, single episode, unspecified: Secondary | ICD-10-CM

## 2013-09-23 DIAGNOSIS — I10 Essential (primary) hypertension: Secondary | ICD-10-CM

## 2013-09-23 DIAGNOSIS — F32A Depression, unspecified: Secondary | ICD-10-CM

## 2013-09-23 LAB — BASIC METABOLIC PANEL
CO2: 27 mEq/L (ref 19–32)
Calcium: 9.6 mg/dL (ref 8.4–10.5)
Chloride: 101 mEq/L (ref 96–112)
Glucose, Bld: 87 mg/dL (ref 70–99)
Potassium: 4.4 mEq/L (ref 3.5–5.3)
Sodium: 135 mEq/L (ref 135–145)

## 2013-09-23 MED ORDER — LISINOPRIL 2.5 MG PO TABS: 2.5000 mg | ORAL_TABLET | Freq: Every day | ORAL | Status: DC

## 2013-09-23 MED ORDER — CITALOPRAM HYDROBROMIDE 40 MG PO TABS: 40.0000 mg | ORAL_TABLET | Freq: Every day | ORAL | Status: DC

## 2013-09-23 MED ORDER — FUROSEMIDE 20 MG PO TABS: 20.0000 mg | ORAL_TABLET | Freq: Every day | ORAL | Status: DC

## 2013-09-23 MED ORDER — METOPROLOL TARTRATE 25 MG PO TABS: 25.0000 mg | ORAL_TABLET | Freq: Two times a day (BID) | ORAL | Status: DC

## 2013-09-23 MED ORDER — HYDRALAZINE HCL 10 MG PO TABS: 10.0000 mg | ORAL_TABLET | Freq: Three times a day (TID) | ORAL | Status: DC

## 2013-09-23 MED ORDER — PANTOPRAZOLE SODIUM 40 MG PO TBEC: 40.0000 mg | DELAYED_RELEASE_TABLET | Freq: Every day | ORAL | Status: DC

## 2013-09-23 NOTE — Progress Notes (Signed)
  Subjective:    Patient ID: Frank [name], male    DOB: [birth year], 57 y.o.   MRN: [id number]  HPI Pt here to establish care  No acute problems  Depression: Feels celexa is helping, improvement in irritability and sleep mainly. HAs been on same dose for 1 + year and would like to increase.   Reviewed medications in detail  Reviewed and updated pertinent medical, surgical, family, social history.  Review of Systems No chest pain, dyspnea, edema, n/v/d.     Objective:   Physical Exam  Gen: NAD, alert, cooperative with exam HEENT: NCAT, EOMI, PERRL, MMM CV: RRR, good S1/S2, 1-2/3 systolic murmur Resp: CTABL, no wheezes, non-labored Abd: SNTND, BS present, no guarding or organomegaly Ext: No edema, warm Neuro: Alert and oriented, No gross deficits     Assessment & Plan:  See problem specific a/p

## 2013-09-23 NOTE — Patient Instructions (Signed)
It was great to meet you!!  Follow up in 3 months.   You need a colonoscopy, call [facility] to schedule an appt.

## 2013-09-23 NOTE — Assessment & Plan Note (Signed)
Improved with Clexa but would like to increase- i agree.  No SI PHQ-9 score is 9, 0 on #9 Increase celexa to 40 mg daily, has tolerated well for 1+ year

## 2013-09-28 ENCOUNTER — Telehealth: Payer: Self-pay | Admitting: *Deleted

## 2013-09-28 ENCOUNTER — Encounter: Payer: Self-pay | Admitting: Physician Assistant

## 2013-09-28 ENCOUNTER — Ambulatory Visit (INDEPENDENT_AMBULATORY_CARE_PROVIDER_SITE_OTHER): Payer: Medicare Other | Admitting: Physician Assistant

## 2013-09-28 VITALS — BP 90/60 | HR 58 | Ht 66.54 in | Wt 180.5 lb

## 2013-09-28 DIAGNOSIS — Z7901 Long term (current) use of anticoagulants: Secondary | ICD-10-CM | POA: Insufficient documentation

## 2013-09-28 DIAGNOSIS — Z1211 Encounter for screening for malignant neoplasm of colon: Secondary | ICD-10-CM

## 2013-09-28 MED ORDER — MOVIPREP 100 G PO SOLR: 1.0000 | Freq: Once | ORAL | Status: DC

## 2013-09-28 NOTE — Patient Instructions (Signed)
You have been scheduled for a colonoscopy with propofol. Please follow written instructions given to you at your visit today.  Please pick up your prep kit at the pharmacy within the next 1-3 days. Moviprep prescription sent to [facility]. If you use inhalers (even only as needed), please bring them with you on the day of your procedure. Your physician has requested that you go to www.startemmi.com and enter the access code given to you at your visit today. This web site gives a general overview about your procedure. However, you should still follow specific instructions given to you by our office regarding your preparation for the procedure.

## 2013-09-28 NOTE — Progress Notes (Signed)
Reviewed and agree with management plan.  Kery Batzel T. Zoee Heeney, MD FACG 

## 2013-09-28 NOTE — Telephone Encounter (Signed)
I called [facility] now [facility] and made an appointment with Dr. Sanjuana Kava on 10-21-2013 at 2:15 Pm.  I called Frank [name] and gave him the appointment information. He was pleased with the appointment date and time.

## 2013-09-28 NOTE — Progress Notes (Signed)
Subjective:    Patient ID: Frank [name], male    DOB: [birth year], 57 y.o.   MRN: [id number]  HPI  Frank [name] is a pleasant 57 year old white male referred today by his primary physician for colon screening. Patient has no prior history of GI problems other than GERD. He has not had any prior endoscopic evaluation. He has no current GI complaints. Specifically denies ongoing issues with heartburn or indigestion no dysphagia. His appetite has been fine weight has been stable. No complaints of abdominal pain constipation melena or hematochezia. Family history is negative for colon cancer polyps as far as he is aware Patient does have significant coronary artery disease is status post CABG in 2004, he had a stent placed in 2011 and then catheter in July of 2013 with 2 drug-eluting stents placed from the SVG to the PDA. He is maintained on aspirin and Plavix and is followed by Dr. Sanjuana [name]. When asked about any cardiac symptoms he states that he does get intermittent episodes of sharp chest pain there usually exertional sometimes with walking the dog etc. He says he has nitroglycerin but does not use it. He also gets episodes of shortness of breath sometimes with minimal exertion. His wife points out that he sometimes gets short of breath even after taking a shower with drying off etc. Last echo done 2013 showed an EF of 55%, he does have mitral regurg and congestive heart failure    Review of Systems  Constitutional: Negative.   HENT: Negative.   Eyes: Negative.   Respiratory: Positive for shortness of breath.   Cardiovascular: Positive for chest pain.  Gastrointestinal: Negative.   Endocrine: Negative.   Genitourinary: Negative.   Musculoskeletal: Negative.   Skin: Negative.   Allergic/Immunologic: Negative.   Neurological: Negative.   Hematological: Negative.   Psychiatric/Behavioral: Negative.    Outpatient Prescriptions Prior to Visit  Medication Sig Dispense Refill  . aspirin 81 MG tablet  Take 1 tablet (81 mg total) by mouth daily.      . citalopram (CELEXA) 40 MG tablet Take 1 tablet (40 mg total) by mouth daily.  90 tablet  3  . clopidogrel (PLAVIX) 75 MG tablet Take 1 tablet (75 mg total) by mouth daily.  90 tablet  3  . furosemide (LASIX) 20 MG tablet Take 1 tablet (20 mg total) by mouth daily.  90 tablet  3  . hydrALAZINE (APRESOLINE) 10 MG tablet Take 1 tablet (10 mg total) by mouth 3 (three) times daily.  90 tablet  3  . lisinopril (PRINIVIL,ZESTRIL) 2.5 MG tablet Take 1 tablet (2.5 mg total) by mouth daily.  90 tablet  3  . metoprolol tartrate (LOPRESSOR) 25 MG tablet Take 1 tablet (25 mg total) by mouth 2 (two) times daily.  180 tablet  3  . nitroGLYCERIN (NITROSTAT) 0.4 MG SL tablet Place 1 tablet (0.4 mg total) under the tongue every 5 (five) minutes as needed for chest pain.  25 tablet  6  . pantoprazole (PROTONIX) 40 MG tablet Take 1 tablet (40 mg total) by mouth daily.  90 tablet  3  . potassium chloride (K-DUR,KLOR-CON) 10 MEQ tablet Take 1 tablet (10 mEq total) by mouth daily.  30 tablet  6  . simvastatin (ZOCOR) 40 MG tablet Take one tablet by mouth every other day.  45 tablet  3   No facility-administered medications prior to visit.   Allergies  Allergen Reactions  . Sulfur Rash   Patient Active Problem List   Diagnosis Date  Noted  . Chronic anticoagulation 09/28/2013  . Actinic keratosis 10/31/2012  . Snoring disorder 10/31/2012  . Umbilical hernia 03/28/2012  . Dyspnea 03/20/2012  . Depression 01/02/2012  . Muscle spasm of both lower legs 01/02/2012  . Mitral regurgitation 06/14/2010  . CAD 04/07/2010  . Low HDL (under 40) 11/07/2009  . Essential hypertension, benign 11/07/2009  . Congestive heart failure 11/07/2009  . GERD 11/07/2009   History   Social History  . Marital Status: Legally Separated    Spouse Name: N/A    Number of Children: 3  . Years of Education: N/A   Occupational History  . Unemployed HVAC    Social History Main  Topics  . Smoking status: Former Smoker -- 3.00 packs/day for 40 years    Types: Cigarettes    Quit date: 01/23/2003  . Smokeless tobacco: Never Used  . 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 Drug Use: No     Comment: 05/20/12 "last marijuana was 2004"  . Sexual Activity: Yes   Other Topics Concern  . Not on file   Social History Narrative  . No narrative on file   family history includes Cancer in his mother; Diabetes in his father; Heart disease in his mother; Heart disease (age of onset: 71) in his brother; Hyperlipidemia in his father; Hypertension in his father; Stomach cancer in his father.     Objective:   Physical Exam and well-developed white male in no acute distress, blood pressure 90/60 pulse 58 height 5 foot 6 weight 180. HEENT ;nontraumatic normocephalic EOMI PERRLA sclera anicteric, Supple; no JVD, Cardiovascular ;regular rate and rhythm with S1-S2 he does have a soft systolic murmur, Pulmonary; clear bilaterally few basilar rhonchi, Abdomen; soft nontender nondistended bowel sounds are active there is no palpable mass or hepatosplenomegaly, Rectal; exam not done, Extremities; no clubbing cyanosis or edema skin warm and dry, Psych; mood and affect appropriate         Assessment & Plan:  #20  57 year old male referred for colon neoplasia screening. He is average risk and asymptomatic. #2 significant coronary artery disease status post CABG 2004, stent placement 2011 and then to drug-eluting stents placed 2013. Patient also reports intermittent chest pain usually exertional and frequent episodes of shortness of breath. #2 congestive heart failure-last echo 2013 EF 55% #3 mitral regurg #4 depression #5 chronic antiplatelet therapy with Plavix and aspirin  Plan; Will schedule patient for colonoscopy with Dr. Russella [name] in early January. Procedure discussed in detail with the patient and his wife and they're  agreeable to proceed. I am concerned about his cardiac symptoms and we have made him a followup appointment with Dr. Sanjuana [name] in a few weeks. Patient ideally would need to come off of Plavix for 5 days prior to his colonoscopy. If further cardiac intervention is needed colonoscopy can be canceled and rescheduled at a later date.

## 2013-09-28 NOTE — Telephone Encounter (Signed)
09/28/2013   RE: Frank [name] DOB: [birth year] MRN: [id number]   Dear Dr. Verne Carrow,    We have scheduled the above patient for an endoscopic procedure. Our records show that he is on anticoagulation therapy.  We made an appointment for the patient to see you in your office on 10-21-2013.  I felt there were some concerning cardiac issues that may need addressed before he has a colonoscopy.   If you feel he can proceed with the procedure, please advise as to how long the patient may come off his therapy of Plavix prior to the procedure, which is scheduled for 11-24-2013.   Please fax back/ or route the completed form to [facility] CMA at [phone].   Sincerely,    Amy Esterwood PA-C

## 2013-09-29 NOTE — Telephone Encounter (Signed)
Reviewed. cdm 

## 2013-09-30 ENCOUNTER — Encounter: Payer: Self-pay | Admitting: Family Medicine

## 2013-09-30 ENCOUNTER — Encounter: Payer: Self-pay | Admitting: Gastroenterology

## 2013-10-14 ENCOUNTER — Encounter: Payer: Medicare Other | Admitting: Internal Medicine

## 2013-10-20 NOTE — Telephone Encounter (Signed)
I called and left a message for the patient.  I reminded him that he has an appointment with Dr. Clifton James tomorrow 10-21-2013.  I asked him to remind the doctor and ask him to get back to [place] whether he wants the patient to hold the Plavix or not.

## 2013-10-21 ENCOUNTER — Ambulatory Visit: Payer: Medicare Other | Admitting: Cardiovascular Disease

## 2013-10-27 ENCOUNTER — Encounter (HOSPITAL_COMMUNITY): Payer: Self-pay | Admitting: Pharmacy Technician

## 2013-10-27 ENCOUNTER — Encounter: Payer: Self-pay | Admitting: *Deleted

## 2013-10-27 ENCOUNTER — Ambulatory Visit: Payer: Medicare Other | Admitting: Physician Assistant

## 2013-10-27 ENCOUNTER — Ambulatory Visit (INDEPENDENT_AMBULATORY_CARE_PROVIDER_SITE_OTHER): Payer: Medicare Other | Admitting: Physician Assistant

## 2013-10-27 ENCOUNTER — Encounter: Payer: Self-pay | Admitting: Physician Assistant

## 2013-10-27 VITALS — BP 118/62 | HR 51 | Ht 66.0 in | Wt 178.4 lb

## 2013-10-27 DIAGNOSIS — I209 Angina pectoris, unspecified: Secondary | ICD-10-CM

## 2013-10-27 DIAGNOSIS — I059 Rheumatic mitral valve disease, unspecified: Secondary | ICD-10-CM

## 2013-10-27 DIAGNOSIS — I340 Nonrheumatic mitral (valve) insufficiency: Secondary | ICD-10-CM

## 2013-10-27 DIAGNOSIS — I25810 Atherosclerosis of coronary artery bypass graft(s) without angina pectoris: Secondary | ICD-10-CM

## 2013-10-27 DIAGNOSIS — I10 Essential (primary) hypertension: Secondary | ICD-10-CM

## 2013-10-27 DIAGNOSIS — I208 Other forms of angina pectoris: Secondary | ICD-10-CM

## 2013-10-27 DIAGNOSIS — E785 Hyperlipidemia, unspecified: Secondary | ICD-10-CM

## 2013-10-27 LAB — CBC WITH DIFFERENTIAL/PLATELET
Basophils Relative: 0.9 % (ref 0.0–3.0)
Eosinophils Absolute: 0.5 10*3/uL (ref 0.0–0.7)
MCHC: 33.7 g/dL (ref 30.0–36.0)
MCV: 88.1 fl (ref 78.0–100.0)
Monocytes Absolute: 0.7 10*3/uL (ref 0.1–1.0)
Neutro Abs: 4.2 10*3/uL (ref 1.4–7.7)
Neutrophils Relative %: 56.4 % (ref 43.0–77.0)
Platelets: 213 10*3/uL (ref 150.0–400.0)
RBC: 4.91 Mil/uL (ref 4.22–5.81)

## 2013-10-27 LAB — BASIC METABOLIC PANEL
BUN: 13 mg/dL (ref 6–23)
CO2: 25 mEq/L (ref 19–32)
Chloride: 102 mEq/L (ref 96–112)
Creatinine, Ser: 1.2 mg/dL (ref 0.4–1.5)
Glucose, Bld: 95 mg/dL (ref 70–99)
Potassium: 4.1 mEq/L (ref 3.5–5.1)

## 2013-10-27 LAB — PROTIME-INR
INR: 1 ratio (ref 0.8–1.0)
Prothrombin Time: 11 s (ref 10.2–12.4)

## 2013-10-27 NOTE — Patient Instructions (Signed)
LEFT HEART CATH 10/30/13 @ 10:30 WITH DR. Clifton James  LAB WORK TODAY; BMET, CBC W/DIFF, PT/INR

## 2013-10-27 NOTE — Progress Notes (Signed)
[address], [address] [place], [place]  [address] Phone: [phone] Fax:  [phone]  Date:  10/27/2013   ID:  Frank [name], DOB Apr 21, 1956, MRN [id number]  PCP:  Kevin Fenton, MD  Cardiologist:  Dr. Verne Carrow     History of Present Illness: Frank [name] is a 57 y.o. male with a hx of CAD, status post CABG in 2004, PCI in 2011, HTN, HL, GERD and mitral regurgitation.  Echocardiogram (01/2012): EF 55-65%, normal wall motion, normal diastolic function, moderate MR, mild LAE.  TEE (03/2012): EF 55%, normal wall motion, posteriorly directed mild to moderate MR, mild LAE, normal RV function.  LHC (05/2012): Proximal LAD 80, mid LAD occluded, ostial OM 80, proximal RI 50, mid RCA stent patent (40-50 ISR), PDA included, PL segment at the site of the old graft anastomosis 80, severe disease in the SVG-PDA in the proximal and distal stented segments in the body of the graft, SVG-OM occluded (old), SVG-diagonal occluded (old), LIMA-LAD patent, EF 55% with moderate MR. PCI: Promus Element (2.75 x 20 mm, 3 x 16 mm) DES x2 to the SVG-PDA.  Last seen by Dr. Verne Carrow 05/2013.  He needs screening colonoscopy in January.  He complained of some exertional CP at his appt with GI and was referred back for evaluation.  Over the last 2 mos, he has noted progressively worsening exertional chest burning and dyspnea.  He is having symptoms with less and less activity.  He has assoc bilateral arm pain.  No jaw pain.  He has felt nauseated.  He has not tried NTG.  He denies syncope.  Denies orthopnea, PND, edema.     Recent Labs: 07/01/2013: ALT 17; HDL 38*; LDL (calc) 86  09/23/2013: Creatinine 1.03; Potassium 4.4   Wt Readings from Last 3 Encounters:  10/27/13 178 lb 6.4 oz (80.922 kg)  09/28/13 180 lb 8 oz (81.874 kg)  09/23/13 176 lb (79.833 kg)     Past Medical History  Diagnosis Date  . Mitral regurgitation     Moderate by TEE 03/27/12  . Low HDL (under 40)   . GERD  (gastroesophageal reflux disease)   . Hypertension   . CAD (coronary artery disease)     5V CABG in 2004; stent to RCA x1 and SVG to acute marginal x2 2011;  PTCA/DES x 2 body of SVG to PDA  05/20/12  . CHF (congestive heart failure)      EF 55% by echo 03/27/2012  . Depression   . Numbness and tingling in hands   . Numbness and tingling of both legs   . Umbilical hernia     unrepaired (05/20/12)  . Arthritis     hands, shoulders, arms  . Chronic lower back pain     "w/activity"  . Anxiety   . HLD (hyperlipidemia)     Current Outpatient Prescriptions  Medication Sig Dispense Refill  . aspirin 81 MG tablet Take 1 tablet (81 mg total) by mouth daily.      . citalopram (CELEXA) 40 MG tablet Take 1 tablet (40 mg total) by mouth daily.  90 tablet  3  . clopidogrel (PLAVIX) 75 MG tablet Take 1 tablet (75 mg total) by mouth daily.  90 tablet  3  . furosemide (LASIX) 20 MG tablet Take 1 tablet (20 mg total) by mouth daily.  90 tablet  3  . hydrALAZINE (APRESOLINE) 10 MG tablet Take 1 tablet (10 mg total) by mouth 3 (three) times daily.  90 tablet  3  . lisinopril (PRINIVIL,ZESTRIL) 2.5 MG tablet Take 1 tablet (2.5 mg total) by mouth daily.  90 tablet  3  . metoprolol tartrate (LOPRESSOR) 25 MG tablet Take 1 tablet (25 mg total) by mouth 2 (two) times daily.  180 tablet  3  . MOVIPREP 100 G SOLR Take 1 kit (200 g total) by mouth once. "Pharmacist please use BIN: [id number] GROUP: [id number] ID: [id number] Call [phone] for pharmacy questions "Pt will save $10"  1 kit  0  . nitroGLYCERIN (NITROSTAT) 0.4 MG SL tablet Place 1 tablet (0.4 mg total) under the tongue every 5 (five) minutes as needed for chest pain.  25 tablet  6  . pantoprazole (PROTONIX) 40 MG tablet Take 1 tablet (40 mg total) by mouth daily.  90 tablet  3  . potassium chloride (K-DUR,KLOR-CON) 10 MEQ tablet Take 1 tablet (10 mEq total) by mouth daily.  30 tablet  6  . simvastatin (ZOCOR) 40 MG tablet Take one tablet by mouth  every other day.  45 tablet  3   No current facility-administered medications for this visit.    Allergies:   Sulfur   Social History:  The patient  reports that he quit smoking about 10 years ago. His smoking use included Cigarettes. He has a 120 pack-year smoking history. He has never used smokeless tobacco. He reports that he does not drink alcohol or use illicit drugs.   Family History:  The patient's family history includes Cancer in his mother; Diabetes in his father; Heart disease in his mother; Heart disease (age of onset: 4) in his brother; Hyperlipidemia in his father; Hypertension in his father; Stomach cancer in his father.   ROS:  Please see the history of present illness.   He has a non-productive cough.   All other systems reviewed and negative.   PHYSICAL EXAM: VS:  BP 118/62  Pulse 51  Ht 5\' 6"  (1.676 m)  Wt 178 lb 6.4 oz (80.922 kg)  BMI 28.81 kg/m2 Well nourished, well developed, in no acute distress HEENT: normal Neck: no JVD Cardiac:  normal S1, S2; RRR; 2/6 systolic murmur at LLSB/apex Lungs:  clear to auscultation bilaterally, no wheezing, rhonchi or rales Abd: soft, nontender, no hepatomegaly Ext: no edema Skin: warm and dry Neuro:  CNs 2-12 intact, no focal abnormalities noted  EKG:  Sinus brady, HR 51, normal axis, NSSTTW changes     ASSESSMENT AND PLAN:  1. Exertional Chest Pain:  He is describing CCS Class 3 symptoms.  He is not having any symptoms at rest.  ECG is unchanged.  He cannot tolerate nitrates due to HAs.  His HR is too low to tolerate more beta blocker.  I have discussed his case with Dr. Verne Carrow who also saw the patient.  We have recommended proceeding with cardiac cath.  Risks and benefits of cardiac catheterization have been discussed with the patient.  These include bleeding, infection, kidney damage, stroke, heart attack, death.  The patient understands these risks and is willing to proceed.  2. CAD:  Proceed with cardiac  cath as noted.  Continue ASA, Plavix, beta blocker, statin. 3. Mitral Regurgitation:  Mild to mod by TEE in 2013.  His current symptoms and exam do not suggest worsening.   4. Hypertension:  Controlled.  5. Hyperlipidemia:  Continue statin. 6. Disposition:  Plan cardia cath with Dr. Verne Carrow this week.  Colonoscopy is planned for next month.   Signed, Tereso Newcomer, PA-C  10/27/2013 10:13 AM

## 2013-10-27 NOTE — H&P (Signed)
History and Physical   Date:  10/27/2013   ID:  Frank [name], DOB 04/27/1956, MRN [id number]  PCP:  Kevin Fenton, MD  Cardiologist:  Dr. Verne Carrow     History of Present Illness: Frank [name] is a 57 y.o. male with a hx of CAD, status post CABG in 2004, PCI in 2011, HTN, HL, GERD and mitral regurgitation.  Echocardiogram (01/2012): EF 55-65%, normal wall motion, normal diastolic function, moderate MR, mild LAE.  TEE (03/2012): EF 55%, normal wall motion, posteriorly directed mild to moderate MR, mild LAE, normal RV function.  LHC (05/2012): Proximal LAD 80, mid LAD occluded, ostial OM 80, proximal RI 50, mid RCA stent patent (40-50 ISR), PDA included, PL segment at the site of the old graft anastomosis 80, severe disease in the SVG-PDA in the proximal and distal stented segments in the body of the graft, SVG-OM occluded (old), SVG-diagonal occluded (old), LIMA-LAD patent, EF 55% with moderate MR. PCI: Promus Element (2.75 x 20 mm, 3 x 16 mm) DES x2 to the SVG-PDA.  Last seen by Dr. Verne Carrow 05/2013.  He needs screening colonoscopy in January.  He complained of some exertional CP at his appt with GI and was referred back for evaluation.  Over the last 2 mos, he has noted progressively worsening exertional chest burning and dyspnea.  He is having symptoms with less and less activity.  He has assoc bilateral arm pain.  No jaw pain.  He has felt nauseated.  He has not tried NTG.  He denies syncope.  Denies orthopnea, PND, edema.     Recent Labs: 07/01/2013: ALT 17; HDL 38*; LDL (calc) 86  09/23/2013: Creatinine 1.03; Potassium 4.4   Wt Readings from Last 3 Encounters:  10/27/13 178 lb 6.4 oz (80.922 kg)  09/28/13 180 lb 8 oz (81.874 kg)  09/23/13 176 lb (79.833 kg)     Past Medical History  Diagnosis Date  . Mitral regurgitation     Moderate by TEE 03/27/12  . Low HDL (under 40)   . GERD (gastroesophageal reflux disease)   . Hypertension   . CAD (coronary artery  disease)     5V CABG in 2004; stent to RCA x1 and SVG to acute marginal x2 2011;  PTCA/DES x 2 body of SVG to PDA  05/20/12  . CHF (congestive heart failure)      EF 55% by echo 03/27/2012  . Depression   . Numbness and tingling in hands   . Numbness and tingling of both legs   . Umbilical hernia     unrepaired (05/20/12)  . Arthritis     hands, shoulders, arms  . Chronic lower back pain     "w/activity"  . Anxiety   . HLD (hyperlipidemia)     Current Outpatient Prescriptions  Medication Sig Dispense Refill  . aspirin 81 MG tablet Take 1 tablet (81 mg total) by mouth daily.      . citalopram (CELEXA) 40 MG tablet Take 1 tablet (40 mg total) by mouth daily.  90 tablet  3  . clopidogrel (PLAVIX) 75 MG tablet Take 1 tablet (75 mg total) by mouth daily.  90 tablet  3  . furosemide (LASIX) 20 MG tablet Take 1 tablet (20 mg total) by mouth daily.  90 tablet  3  . hydrALAZINE (APRESOLINE) 10 MG tablet Take 1 tablet (10 mg total) by mouth 3 (three) times daily.  90 tablet  3  . lisinopril (PRINIVIL,ZESTRIL) 2.5 MG tablet Take  1 tablet (2.5 mg total) by mouth daily.  90 tablet  3  . metoprolol tartrate (LOPRESSOR) 25 MG tablet Take 1 tablet (25 mg total) by mouth 2 (two) times daily.  180 tablet  3  . MOVIPREP 100 G SOLR Take 1 kit (200 g total) by mouth once. "Pharmacist please use BIN: [id number] GROUP: [id number] ID: [id number] Call [phone] for pharmacy questions "Pt will save $10"  1 kit  0  . nitroGLYCERIN (NITROSTAT) 0.4 MG SL tablet Place 1 tablet (0.4 mg total) under the tongue every 5 (five) minutes as needed for chest pain.  25 tablet  6  . pantoprazole (PROTONIX) 40 MG tablet Take 1 tablet (40 mg total) by mouth daily.  90 tablet  3  . potassium chloride (K-DUR,KLOR-CON) 10 MEQ tablet Take 1 tablet (10 mEq total) by mouth daily.  30 tablet  6  . simvastatin (ZOCOR) 40 MG tablet Take one tablet by mouth every other day.  45 tablet  3   No current facility-administered medications  for this visit.    Allergies:   Sulfur   Social History:  The patient  reports that he quit smoking about 10 years ago. His smoking use included Cigarettes. He has a 120 pack-year smoking history. He has never used smokeless tobacco. He reports that he does not drink alcohol or use illicit drugs.   Family History:  The patient's family history includes Cancer in his mother; Diabetes in his father; Heart disease in his mother; Heart disease (age of onset: 26) in his brother; Hyperlipidemia in his father; Hypertension in his father; Stomach cancer in his father.   ROS:  Please see the history of present illness.   He has a non-productive cough.   All other systems reviewed and negative.   PHYSICAL EXAM: VS:  BP 118/62  Pulse 51  Ht 5\' 6"  (1.676 m)  Wt 178 lb 6.4 oz (80.922 kg)  BMI 28.81 kg/m2 Well nourished, well developed, in no acute distress HEENT: normal Neck: no JVD Cardiac:  normal S1, S2; RRR; 2/6 systolic murmur at LLSB/apex Lungs:  clear to auscultation bilaterally, no wheezing, rhonchi or rales Abd: soft, nontender, no hepatomegaly Ext: no edema Skin: warm and dry Neuro:  CNs 2-12 intact, no focal abnormalities noted  EKG:  Sinus brady, HR 51, normal axis, NSSTTW changes     ASSESSMENT AND PLAN:  1. Exertional Chest Pain:  He is describing CCS Class 3 symptoms.  He is not having any symptoms at rest.  ECG is unchanged.  He cannot tolerate nitrates due to HAs.  His HR is too low to tolerate more beta blocker.  I have discussed his case with Dr. Verne Carrow who also saw the patient.  We have recommended proceeding with cardiac cath.  Risks and benefits of cardiac catheterization have been discussed with the patient.  These include bleeding, infection, kidney damage, stroke, heart attack, death.  The patient understands these risks and is willing to proceed.  2. CAD:  Proceed with cardiac cath as noted.  Continue ASA, Plavix, beta blocker, statin. 3. Mitral  Regurgitation:  Mild to mod by TEE in 2013.  His current symptoms and exam do not suggest worsening.   4. Hypertension:  Controlled.  5. Hyperlipidemia:  Continue statin. 6. Disposition:  Plan cardia cath with Dr. Verne Carrow this week.  Colonoscopy is planned for next month.   Signed, Tereso Newcomer, PA-C  10/27/2013 10:13 AM    I have personally seen and  examined this patient with Tereso Newcomer, PA-C. I agree with the assessment and plan as outlined above. Recent chest pain concerning for unstable angina. Will plan cath later this week with possible PCI.   Hayven Fatima 10/27/2013 1:05 PM

## 2013-10-30 ENCOUNTER — Encounter (HOSPITAL_COMMUNITY)
Admission: RE | Disposition: A | Discharge status: Home / Self Care | Payer: Medicare Other | Source: Ambulatory Visit | Attending: Cardiovascular Disease

## 2013-10-30 ENCOUNTER — Ambulatory Visit (HOSPITAL_COMMUNITY)
Admission: RE | Admit: 2013-10-30 | Discharge: 2013-10-31 | Disposition: A | Discharge status: Home / Self Care | Payer: Medicare Other | Source: Ambulatory Visit | Attending: Cardiovascular Disease | Admitting: Cardiovascular Disease

## 2013-10-30 ENCOUNTER — Encounter (HOSPITAL_COMMUNITY): Payer: Self-pay | Admitting: General Practice

## 2013-10-30 DIAGNOSIS — R0683 Snoring: Secondary | ICD-10-CM

## 2013-10-30 DIAGNOSIS — Z7901 Long term (current) use of anticoagulants: Secondary | ICD-10-CM

## 2013-10-30 DIAGNOSIS — I200 Unstable angina: Secondary | ICD-10-CM | POA: Diagnosis present

## 2013-10-30 DIAGNOSIS — Z87891 Personal history of nicotine dependence: Secondary | ICD-10-CM | POA: Insufficient documentation

## 2013-10-30 DIAGNOSIS — I10 Essential (primary) hypertension: Secondary | ICD-10-CM

## 2013-10-30 DIAGNOSIS — I2510 Atherosclerotic heart disease of native coronary artery without angina pectoris: Secondary | ICD-10-CM

## 2013-10-30 DIAGNOSIS — I25810 Atherosclerosis of coronary artery bypass graft(s) without angina pectoris: Secondary | ICD-10-CM | POA: Insufficient documentation

## 2013-10-30 DIAGNOSIS — Z9861 Coronary angioplasty status: Secondary | ICD-10-CM | POA: Insufficient documentation

## 2013-10-30 DIAGNOSIS — I509 Heart failure, unspecified: Secondary | ICD-10-CM | POA: Insufficient documentation

## 2013-10-30 DIAGNOSIS — Z7982 Long term (current) use of aspirin: Secondary | ICD-10-CM | POA: Insufficient documentation

## 2013-10-30 DIAGNOSIS — R0600 Dyspnea, unspecified: Secondary | ICD-10-CM

## 2013-10-30 DIAGNOSIS — E786 Lipoprotein deficiency: Secondary | ICD-10-CM

## 2013-10-30 DIAGNOSIS — Y831 Surgical operation with implant of artificial internal device as the cause of abnormal reaction of the patient, or of later complication, without mention of misadventure at the time of the procedure: Secondary | ICD-10-CM | POA: Insufficient documentation

## 2013-10-30 DIAGNOSIS — I208 Other forms of angina pectoris: Secondary | ICD-10-CM

## 2013-10-30 DIAGNOSIS — F329 Major depressive disorder, single episode, unspecified: Secondary | ICD-10-CM

## 2013-10-30 DIAGNOSIS — M62838 Other muscle spasm: Secondary | ICD-10-CM

## 2013-10-30 DIAGNOSIS — I059 Rheumatic mitral valve disease, unspecified: Secondary | ICD-10-CM | POA: Insufficient documentation

## 2013-10-30 DIAGNOSIS — K429 Umbilical hernia without obstruction or gangrene: Secondary | ICD-10-CM

## 2013-10-30 DIAGNOSIS — I340 Nonrheumatic mitral (valve) insufficiency: Secondary | ICD-10-CM

## 2013-10-30 DIAGNOSIS — K219 Gastro-esophageal reflux disease without esophagitis: Secondary | ICD-10-CM

## 2013-10-30 DIAGNOSIS — Z7902 Long term (current) use of antithrombotics/antiplatelets: Secondary | ICD-10-CM | POA: Insufficient documentation

## 2013-10-30 DIAGNOSIS — F32A Depression, unspecified: Secondary | ICD-10-CM

## 2013-10-30 DIAGNOSIS — E785 Hyperlipidemia, unspecified: Secondary | ICD-10-CM

## 2013-10-30 DIAGNOSIS — L570 Actinic keratosis: Secondary | ICD-10-CM

## 2013-10-30 HISTORY — PX: PERCUTANEOUS CORONARY STENT INTERVENTION (PCI-S): SHX5485

## 2013-10-30 HISTORY — PX: LEFT HEART CATHETERIZATION WITH CORONARY/GRAFT ANGIOGRAM: SHX5450

## 2013-10-30 HISTORY — PX: PTCA: SHX146

## 2013-10-30 SURGERY — LEFT HEART CATHETERIZATION WITH CORONARY/GRAFT ANGIOGRAM
Anesthesia: LOCAL

## 2013-10-30 MED ORDER — SIMVASTATIN 20 MG PO TABS
20.0000 mg | ORAL_TABLET | Freq: Every day | ORAL | Status: DC
  Administered 2013-10-30: 18:00:00 20 mg via ORAL
  Filled 2013-10-30 (×2): qty 1

## 2013-10-30 MED ORDER — SODIUM CHLORIDE 0.9 % IV SOLN: INTRAVENOUS | Status: AC

## 2013-10-30 MED ORDER — SODIUM CHLORIDE 0.9 % IJ SOLN: 3.0000 mL | Freq: Two times a day (BID) | INTRAMUSCULAR | Status: DC

## 2013-10-30 MED ORDER — HEPARIN (PORCINE) IN NACL 2-0.9 UNIT/ML-% IJ SOLN
INTRAMUSCULAR | Status: AC
  Filled 2013-10-30: qty 1000

## 2013-10-30 MED ORDER — NITROGLYCERIN 0.2 MG/ML ON CALL CATH LAB
INTRAVENOUS | Status: AC
  Filled 2013-10-30: qty 1

## 2013-10-30 MED ORDER — ONDANSETRON HCL 4 MG/2ML IJ SOLN: 4.0000 mg | Freq: Four times a day (QID) | INTRAMUSCULAR | Status: DC | PRN

## 2013-10-30 MED ORDER — FUROSEMIDE 20 MG PO TABS
20.0000 mg | ORAL_TABLET | Freq: Every day | ORAL | Status: DC
  Administered 2013-10-30: 18:00:00 20 mg via ORAL
  Filled 2013-10-30 (×2): qty 1

## 2013-10-30 MED ORDER — CLOPIDOGREL BISULFATE 75 MG PO TABS
75.0000 mg | ORAL_TABLET | Freq: Every day | ORAL | Status: DC
  Administered 2013-10-31: 09:00:00 75 mg via ORAL
  Filled 2013-10-30: qty 1

## 2013-10-30 MED ORDER — SODIUM CHLORIDE 0.9 % IV SOLN
INTRAVENOUS | Status: DC
  Administered 2013-10-30: 09:00:00 via INTRAVENOUS

## 2013-10-30 MED ORDER — ACETAMINOPHEN 325 MG PO TABS: 650.0000 mg | ORAL_TABLET | ORAL | Status: DC | PRN

## 2013-10-30 MED ORDER — SODIUM CHLORIDE 0.9 % IJ SOLN: 3.0000 mL | INTRAMUSCULAR | Status: DC | PRN

## 2013-10-30 MED ORDER — HYDRALAZINE HCL 10 MG PO TABS
10.0000 mg | ORAL_TABLET | Freq: Two times a day (BID) | ORAL | Status: DC
  Administered 2013-10-30: 10 mg via ORAL
  Filled 2013-10-30 (×3): qty 1

## 2013-10-30 MED ORDER — LIDOCAINE HCL (PF) 1 % IJ SOLN
INTRAMUSCULAR | Status: AC
  Filled 2013-10-30: qty 30

## 2013-10-30 MED ORDER — METOPROLOL TARTRATE 25 MG PO TABS
25.0000 mg | ORAL_TABLET | Freq: Two times a day (BID) | ORAL | Status: DC
  Administered 2013-10-30: 25 mg via ORAL
  Filled 2013-10-30 (×3): qty 1

## 2013-10-30 MED ORDER — PANTOPRAZOLE SODIUM 40 MG PO TBEC
40.0000 mg | DELAYED_RELEASE_TABLET | Freq: Every day | ORAL | Status: DC
  Administered 2013-10-30: 40 mg via ORAL
  Filled 2013-10-30: qty 1

## 2013-10-30 MED ORDER — OXYCODONE-ACETAMINOPHEN 5-325 MG PO TABS: 1.0000 | ORAL_TABLET | ORAL | Status: DC | PRN

## 2013-10-30 MED ORDER — LISINOPRIL 2.5 MG PO TABS
2.5000 mg | ORAL_TABLET | Freq: Every day | ORAL | Status: DC
  Administered 2013-10-30: 18:00:00 2.5 mg via ORAL
  Filled 2013-10-30 (×2): qty 1

## 2013-10-30 MED ORDER — ASPIRIN 81 MG PO CHEW: 81.0000 mg | CHEWABLE_TABLET | ORAL | Status: DC

## 2013-10-30 MED ORDER — MIDAZOLAM HCL 2 MG/2ML IJ SOLN
INTRAMUSCULAR | Status: AC
  Filled 2013-10-30: qty 2

## 2013-10-30 MED ORDER — CITALOPRAM HYDROBROMIDE 40 MG PO TABS
40.0000 mg | ORAL_TABLET | Freq: Every day | ORAL | Status: DC
  Administered 2013-10-30: 40 mg via ORAL
  Filled 2013-10-30 (×2): qty 1

## 2013-10-30 MED ORDER — ASPIRIN 81 MG PO CHEW
81.0000 mg | CHEWABLE_TABLET | Freq: Every day | ORAL | Status: DC
  Administered 2013-10-31: 81 mg via ORAL
  Filled 2013-10-30 (×2): qty 1

## 2013-10-30 MED ORDER — BIVALIRUDIN 250 MG IV SOLR
INTRAVENOUS | Status: AC
  Filled 2013-10-30: qty 250

## 2013-10-30 MED ORDER — SODIUM CHLORIDE 0.9 % IV SOLN: 250.0000 mL | INTRAVENOUS | Status: DC | PRN

## 2013-10-30 MED ORDER — MORPHINE SULFATE 2 MG/ML IJ SOLN: 2.0000 mg | INTRAMUSCULAR | Status: DC | PRN

## 2013-10-30 MED ORDER — FENTANYL CITRATE 0.05 MG/ML IJ SOLN
INTRAMUSCULAR | Status: AC
  Filled 2013-10-30: qty 2

## 2013-10-30 NOTE — Interval H&P Note (Signed)
History and Physical Interval Note:  10/30/2013 1:06 PM  Frank [name]  has presented today for cardiac cath with the diagnosis of CAD, unstable angina. The various methods of treatment have been discussed with the patient and family. After consideration of risks, benefits and other options for treatment, the patient has consented to  Procedure(s): LEFT HEART CATHETERIZATION WITH CORONARY/GRAFT ANGIOGRAM (N/A) as a surgical intervention .  The patient's history has been reviewed, patient examined, no change in status, stable for surgery.  I have reviewed the patient's chart and labs.  Questions were answered to the patient's satisfaction.    Cath Lab Visit (complete for each Cath Lab visit)  Clinical Evaluation Leading to the Procedure:   ACS: no  Non-ACS:    Anginal Classification: CCS III  Anti-ischemic medical therapy: Minimal Therapy (1 class of medications)  Non-Invasive Test Results: No non-invasive testing performed  Prior CABG: Previous CABG         MCALHANY,CHRISTOPHER

## 2013-10-30 NOTE — CV Procedure (Signed)
Cardiac Catheterization Operative Report  Frank [name] [id number] 12/12/20142:42 PM Kevin Fenton, MD  Procedure Performed:  1. Left Heart Catheterization 2. Selective Coronary Angiography 3. SVG angiography 4. LIMA graft angiography 5. Left ventricular angiogram 6. PTCA/DES x 2 SVG to PDA 7. Angioseal RFA   Operator: Verne Carrow, MD   Indication: Known CAD s/p 4V CABG and known to have a patent graft to PDA and patent LIMA to LAD by cath in 2013 with placement of 2 drug eluting stents in the SVG to the PDA in July 2013. The SVG to the PDA has stented multiple times. Recent worsening of exertional dyspnea, chest pain c/w unstable angina (class III).    Procedure Details:  The risks, benefits, complications, treatment options, and expected outcomes were discussed with the patient. The patient and/or family concurred with the proposed plan, giving informed consent. The patient was brought to the cath lab after IV hydration was begun and oral premedication was given. The patient was further sedated with Versed and Fentanyl. The right groin was prepped and draped in the usual manner. Using the modified Seldinger access technique, a 5 French sheath was placed in the right femoral artery. Standard diagnostic catheters were used to perform selective coronary angiography. The JR-4 catheter was used to engage the SVG to the PDA and the LIMA graft to the LAD. A pigtail catheter was used to perform a left ventricular angiogram. The patient was found to have severe disease in the ostium of the vein graft and in the distal body of the vein graft within the stent and distal to the stent. I elected to proceed to intervention of the vein graft.   PCI Note: The sheath was upsized to a 6 [place] system. He was given a bolus of Angiomax and a drip was started. He has been on chronic Plavix therapy. An RCB guide was used to engage the SVG to the PDA. When the ACT was greater than 200, I passed  a Cougar IC wire down the vein graft to the PDA and out into the target vessel. A 3.0 x 20 mm [place] balloon was used to dilate the severe stenosis in the distal stented segment and the proximal stented segment. There was disease noted in the body of the vein graft proximal to the proximal stent and distal to the distal stent. I then deployed a 2.75 x 16 mm Promus Element DES in the distal body of the SVG to the PDA. Angiography demonstrated obstruction in the ostium of the vein graft. A 3.0 x 16 mm Promus Element DES was deployed in the ostium of the vein graft extending down into the proximal body of the vein graft. There was excellent flow into the distal vessel.   I then turned my attention to the chronic, severe stenosis in the small caliber distal RCA leading into a small posterolateral branch. This appeared to be the location of the prior vein graft anastomosis. There appeared to be a small thrombus in this small branch. I engaged the RCA with a JR4 guiding catheter. I then passed a Cougar IC wire into the distal RCA. I could not cross the severe stenosis due to distortion of the vessel from prior suture lines. I then used an over the wire balloon and long Whisper wire to attempt to cross the lesion but as not successful. At the conclusion of the case, the very small caliber posterolateral branch had no flow. There was felt to be little gain in  further attempts at opening this lesion so the intervention was stopped. The patient had no EKG changes or chest pain.   There were no immediate complications. Angioseal placed in right femoral artery. The patient was taken to the recovery area in stable condition.   Hemodynamic Findings:  Central aortic pressure: 121/66 Left ventricular pressure: 127/7/22  Angiographic Findings:  Left main: No obstructive disease noted.   Left Anterior Descending Artery: Moderate sized vessel that courses to the apex. There is diffuse proximal 80% stenosis and 100% mid  occlusion. The mid and distal vessel fills from the patent LIMA graft.   Circumflex Artery: Moderate sized vessel with mild plaque. The very small caliber OM branch has 80% ostial stenosis. The Ramus intermediate branch is moderate in caliber with proximal 50% stenosis which appears unchanged from last cath.   Right Coronary Artery: Small to moderate caliber vessel with diffuse plaque. There is a patent stent mid vessel with 40-50% in-stent restenosis, unchanged from last cath. The PDA is occluded and fills from the graft. The posterolateral segment fills antegrade. There appears to be a 99% stenosis leading into the small PL segment at the site of the old graft anastamosis at the suture line. This vessel is small in caliber, 1.75 mm in diameter.   Graft Anatomy:  SVG to PDA is patent with stents present in the proximal/mid and distal body of vein graft. There is 70-80% stenosis in the ostium of the vein graft extending down into the stented segment. The mid stented segment has diffuse 60-70% restenosis. The distal stented segment has 90% restenosis. Just beyond the distal stent there is a 60-70% stenosis. The SVG limb to the PLA is known to be occluded.  SVG to OM is known to be occluded and was not selectively engaged.  SVG to Diagonal is known to be occluded and was not selectively engaged.  LIMA to LAD was patent.   Left Ventricular Angiogram: LVEF 55% with moderate MR   Impression:  1. Severe triple vessel CAD s/p 5V CABG with 2/5 patent bypass grafts.  2. Severe stenosis SVG to PDA in the proximal and distal stented segments in the body of the SVG  3. Successful PTCA/DES x 2 body of SVG to PDA  4. Diffuse moderate non-obstructive disease in RCA and intermediate branch  5. Preserved LV systolic function  6. Moderate MR  7. Unsuccessful PTCA of the very small posterolateral branch of the distal RCA at the old vein graft anastomosis site.   Recommendations: Continue current medical  management including ASA and Plavix.   Complications: None. The patient tolerated the procedure well.

## 2013-10-31 DIAGNOSIS — I25810 Atherosclerosis of coronary artery bypass graft(s) without angina pectoris: Secondary | ICD-10-CM

## 2013-10-31 LAB — BASIC METABOLIC PANEL
BUN: 13 mg/dL (ref 6–23)
CO2: 27 mEq/L (ref 19–32)
Calcium: 9.1 mg/dL (ref 8.4–10.5)
Creatinine, Ser: 1.15 mg/dL (ref 0.50–1.35)
GFR calc non Af Amer: 68 mL/min — ABNORMAL LOW (ref >90)
Glucose, Bld: 125 mg/dL — ABNORMAL HIGH (ref 70–99)

## 2013-10-31 LAB — CBC
Hemoglobin: 13.5 g/dL (ref 13.0–17.0)
MCH: 29.9 pg (ref 26.0–34.0)
MCHC: 33.7 g/dL (ref 30.0–36.0)
MCV: 88.7 fL (ref 78.0–100.0)
RBC: 4.52 MIL/uL (ref 4.22–5.81)

## 2013-10-31 NOTE — Progress Notes (Signed)
CARDIAC REHAB PHASE I   PRE:  Rate/Rhythm: 76SR  BP:  Supine:   Sitting: 112/73  Standing:    SaO2:   MODE:  Ambulation: 1000 ft   POST:  Rate/Rhythm: 74SR  BP:  Supine:   Sitting: 125/77  Standing:    SaO2:  0735-0810 Pt walked 1000 ft with steady gait. Tolerated well. No CP. Education completed with pt and wife. Understanding voiced. Pt has made some diet changes and walks several times per day. Not interested in group setting exercise program so declined CRP 2.   Luetta Nutting, RN BSN  10/31/2013 8:08 AM

## 2013-10-31 NOTE — Discharge Summary (Signed)
Physician Discharge Summary  Patient ID: Frank [name] MRN: [id number] DOB/AGE: [birth year] 57 y.o.  Admit date: 10/30/2013 Discharge date: 10/31/2013  Primary Discharge Diagnosis: 1. CAD S/P CABG 2004 2. S/P PCI to SVG of PDA 3. Unstable Angina  Secondary Discharge Diagnosis: 1. Hyperlipidemia 2. Hypertension 3. GERD 4. Mitral Regurgitation   Significant Diagnostic Studies:  1. Cardiac Cath Impression:  1. Severe triple vessel CAD s/p 5V CABG with 2/5 patent bypass grafts.  2. Severe stenosis SVG to PDA in the proximal and distal stented segments in the body of the SVG  3. Successful PTCA/DES x 2 body of SVG to PDA  4. Diffuse moderate non-obstructive disease in RCA and intermediate branch  5. Preserved LV systolic function  6. Moderate MR  7. Unsuccessful PTCA of the very small posterolateral branch of the distal RCA at the old vein graft anastomosis site.   Hospital Course: Mr. cord is a 57 year old patient of Dr. Clifton James who is admitted with unstable angina with known history of coronary artery disease status post CABG, PCI in 2011 SVG-PDA coronary artery, and severe disease in the SVG to PDA,. He was noticing over the last 2 months had progressively worsening exertional chest pain burning and dyspnea. With associated bilateral arm pain without jaw pain. He was admitted for repeat cardiac catheterization for evaluation of progressive CAD.   Cardiac catheterization was completed on 10/30/2013 by Dr. Clifton James demonstrating severe triple vessel CAD status post 5 vessel CABG with 2 of 5 patent bypass grafts. There was severe stenosis at the SVG to PDA in the proximal and distal prior stented segments in the body of the SVG. The patient had successful PTCA/DES using promise drug-eluting stents to the SVG to PDA. Please see above cardiac catheterization note for more details. There was an unsuccessful attempt at at PTCA of a very small posterior lateral branch of the distal RCA and  an old vein graft anastomosis site.   The patient was seen and examined by Dr. Sharrell Ku on day of discharge. The patient was stable without recurrent complaints of chest pain. Cath site was clean and dry without evidence of hematoma bleeding or infection. Intensive education for cardiac rehabilitation was completed. No new medications were prescribed.  Discharge Exam: Blood pressure 112/73, pulse 61, temperature 97.7 F (36.5 C), temperature source Oral, resp. rate 18, height 5\' 6"  (1.676 m), weight 174 lb 6.1 oz (79.1 kg), SpO2 95.00%.  Labs:   Lab Results  Component Value Date   WBC 8.6 10/31/2013   HGB 13.5 10/31/2013   HCT 40.1 10/31/2013   MCV 88.7 10/31/2013   PLT 169 10/31/2013     Recent Labs Lab 10/31/13 0632  NA 141  K 4.0  CL 102  CO2 27  BUN 13  CREATININE 1.15  CALCIUM 9.1  GLUCOSE 125*   Lab Results  Component Value Date   CKTOTAL 128 04/20/2010   CKMB 3.9 04/20/2010   TROPONINI  Value: 0.04        NO INDICATION OF MYOCARDIAL INJURY. 04/20/2010    Lab Results  Component Value Date   CHOL 160 07/01/2013   CHOL 136 01/02/2012   CHOL  Value: 137        ATP III CLASSIFICATION:  <200     mg/dL   Desirable  409-811  mg/dL   Borderline High  >=914    mg/dL   High        7/82/9562   Lab Results  Component Value  Date   HDL 38* 07/01/2013   HDL 40 01/02/2012   HDL 44 1/61/0960   Lab Results  Component Value Date   LDLCALC 86 07/01/2013   LDLCALC 72 01/02/2012   LDLCALC  Value: 73        Total Cholesterol/HDL:CHD Risk Coronary Heart Disease Risk Table                     Men   Women  1/2 Average Risk   3.4   3.3  Average Risk       5.0   4.4  2 X Average Risk   9.6   7.1  3 X Average Risk  23.4   11.0        Use the calculated Patient Ratio above and the CHD Risk Table to determine the patient's CHD Risk.        ATP III CLASSIFICATION (LDL):  <100     mg/dL   Optimal  454-098  mg/dL   Near or Above                    Optimal  130-159  mg/dL   Borderline  119-147  mg/dL    High  >829     mg/dL   Very High 5/62/1308   Lab Results  Component Value Date   TRIG 178* 07/01/2013   TRIG 121 01/02/2012   TRIG 99 04/06/2010   Lab Results  Component Value Date   CHOLHDL 4.2 07/01/2013   CHOLHDL 3.4 01/02/2012   CHOLHDL 3.1 04/06/2010   No results found for this basename: LDLDIRECT     MVH:QIONG bradycardia with 1st degree A-V block rate of 56 bpm.  FOLLOW UP PLANS AND APPOINTMENTS     Discharge Orders   Future Appointments Provider Department Dept Phone   11/24/2013 1:30 PM Meryl Dare, MD [facility] [phone]   Future Orders Complete By Expires   Diet - low sodium heart healthy  As directed    Increase activity slowly  As directed        Medication List    STOP taking these medications       MOVIPREP 100 G Solr  Generic drug:  peg 3350 powder      TAKE these medications       aspirin 81 MG tablet  Take 1 tablet (81 mg total) by mouth daily.     citalopram 40 MG tablet  Commonly known as:  CELEXA  Take 1 tablet (40 mg total) by mouth daily.     clopidogrel 75 MG tablet  Commonly known as:  PLAVIX  Take 1 tablet (75 mg total) by mouth daily.     furosemide 20 MG tablet  Commonly known as:  LASIX  Take 1 tablet (20 mg total) by mouth daily.     hydrALAZINE 10 MG tablet  Commonly known as:  APRESOLINE  Take 10 mg by mouth 2 (two) times daily.     lisinopril 2.5 MG tablet  Commonly known as:  PRINIVIL,ZESTRIL  Take 1 tablet (2.5 mg total) by mouth daily.     metoprolol tartrate 25 MG tablet  Commonly known as:  LOPRESSOR  Take 1 tablet (25 mg total) by mouth 2 (two) times daily.     nitroGLYCERIN 0.4 MG SL tablet  Commonly known as:  NITROSTAT  Place 1 tablet (0.4 mg total) under the tongue every 5 (five) minutes as needed for chest pain.  pantoprazole 40 MG tablet  Commonly known as:  PROTONIX  Take 1 tablet (40 mg total) by mouth daily.     potassium chloride 10 MEQ tablet  Commonly known as:   K-DUR,KLOR-CON  Take 1 tablet (10 mEq total) by mouth daily.     simvastatin 40 MG tablet  Commonly known as:  ZOCOR  Take one tablet by mouth every other day.       Follow-up Information   Follow up with Verne Carrow, MD. (Our offfice will call you for appt.)    Specialty:  Cardiology   Contact information:   [address] [place] [address] [phone]         Time spent with patient to include physician time:30 minutes  Signed: Joni Reining 10/31/2013, 8:52 AM Co-Sign MD

## 2013-10-31 NOTE — Progress Notes (Signed)
Patient ID: Frank [name], male   DOB: May 21, 1956, 57 y.o.   MRN: [id number] Subjective:  No chest pain or sob. No groin pain.  Objective:  Vital Signs in the last 24 hours: Temp:  [97.3 F (36.3 C)-98.2 F (36.8 C)] 97.7 F (36.5 C) (12/13 0731) Pulse Rate:  [48-61] 61 (12/13 0731) Resp:  [18-20] 18 (12/13 0731) BP: (105-142)/(59-73) 112/73 mmHg (12/13 0731) SpO2:  [95 %-97 %] 95 % (12/13 0731) Weight:  [174 lb 6.1 oz (79.1 kg)-178 lb (80.74 kg)] 174 lb 6.1 oz (79.1 kg) (12/13 0343)  Intake/Output from previous day: 12/12 0701 - 12/13 0700 In: 377.5 [P.O.:120; I.V.:257.5] Out: 950 [Urine:950] Intake/Output from this shift:    Physical Exam: Well appearing NAD HEENT: Unremarkable Neck:  No JVD, no thyromegally Back:  No CVA tenderness Lungs:  Clear with no wheezes. HEART:  Regular rate rhythm, no murmurs, no rubs, no clicks Abd:  soft, positive bowel sounds, no organomegally, no rebound, no guarding Ext:  2 plus pulses, no edema, no cyanosis, no clubbing Skin:  No rashes no nodules Neuro:  CN II through XII intact, motor grossly intact  Lab Results:  Recent Labs  10/31/13 0632  WBC 8.6  HGB 13.5  PLT 169   No results found for this basename: NA, K, CL, CO2, GLUCOSE, BUN, CREATININE,  in the last 72 hours No results found for this basename: TROPONINI, CK, MB,  in the last 72 hours Hepatic Function Panel No results found for this basename: PROT, ALBUMIN, AST, ALT, ALKPHOS, BILITOT, BILIDIR, IBILI,  in the last 72 hours No results found for this basename: CHOL,  in the last 72 hours No results found for this basename: PROTIME,  in the last 72 hours  Imaging: No results found.  Cardiac Studies: Tele - nsr Assessment/Plan:  1. CAD, s/p stenting of a SVG graft to the PDA 2. Dyslipidemia Rec: ok to discharge home. No change in meds. F/u Dr. Sanjuana Kava.  LOS: 1 day    Gregg Taylor,M.D. 10/31/2013, 8:04 AM

## 2013-11-02 MED FILL — Sodium Chloride IV Soln 0.9%: INTRAVENOUS | Qty: 50 | Status: AC

## 2013-11-24 ENCOUNTER — Encounter: Payer: Medicare Other | Admitting: Gastroenterology

## 2013-11-25 ENCOUNTER — Encounter: Payer: Self-pay | Admitting: Cardiovascular Disease

## 2013-11-25 ENCOUNTER — Ambulatory Visit (INDEPENDENT_AMBULATORY_CARE_PROVIDER_SITE_OTHER): Payer: Medicare Other | Admitting: Cardiovascular Disease

## 2013-11-25 VITALS — BP 131/79 | HR 50 | Ht 66.0 in | Wt 180.0 lb

## 2013-11-25 DIAGNOSIS — I059 Rheumatic mitral valve disease, unspecified: Secondary | ICD-10-CM

## 2013-11-25 DIAGNOSIS — I25810 Atherosclerosis of coronary artery bypass graft(s) without angina pectoris: Secondary | ICD-10-CM

## 2013-11-25 DIAGNOSIS — R011 Cardiac murmur, unspecified: Secondary | ICD-10-CM

## 2013-11-25 DIAGNOSIS — I10 Essential (primary) hypertension: Secondary | ICD-10-CM

## 2013-11-25 DIAGNOSIS — I340 Nonrheumatic mitral (valve) insufficiency: Secondary | ICD-10-CM

## 2013-11-25 MED ORDER — POTASSIUM CHLORIDE CRYS ER 10 MEQ PO TBCR: 10.0000 meq | EXTENDED_RELEASE_TABLET | Freq: Every day | ORAL | Status: DC

## 2013-11-25 NOTE — Patient Instructions (Signed)
Your physician wants you to follow-up in:  6 months. You will receive a reminder letter in the mail two months in advance. If you don't receive a letter, please call our office to schedule the follow-up appointment.   

## 2013-11-25 NOTE — Progress Notes (Signed)
History of Present Illness: 58 yo male with a history of CAD s/p CABG 2004 and stenting in 2011, HLD, HTN, GERD and mitral regurgitation who is here today for cardiac follow up. He had been followed by [facility] but was non-compliant with f/u appointments and I met him in May 2013 for the first time. Echo 01/31/12 with normal LV size and function with at least moderate MR. He told me at the first visit that he felt fatigued and had SOB. He had no LE edema, orthopnea or chest pain. I arranged a TEE on 03/27/12 to evaluate his MR and this showed mild to moderate MR. At his f/u visit 05/06/12, he had c/o exertional chest pressure, dyspnea and fatigue. I arranged a cardiac cath on 05/20/12. There was severe disease in the SVG to the PDA and 2 DES were placed in the SVG to the PDA. The LIMA was patent but the vein grafts to the OM, Diagonal and PLA were occluded. He had recurrence of angina December 2014. Repeat cardiac cath 10/30/13 with patent LIMA to LAD, patent SVG to PDA with severe disease. Unchanged disease in native vessels as below. 2 drug eluting stents were placed in the SVG to the PDA.   He is here today for follow up. No chest pain or SOB. No complaints today. He has not been using his NTG. He is walking every day.   Primary Care Physician: [facility]  Last Lipid Profile:Lipid Panel     Component Value Date/Time   CHOL 160 07/01/2013 0859   TRIG 178* 07/01/2013 0859   HDL 38* 07/01/2013 0859   CHOLHDL 4.2 07/01/2013 0859   VLDL 36 07/01/2013 0859   LDLCALC 86 07/01/2013 0859    Past Medical History  Diagnosis Date  . Mitral regurgitation     Moderate by TEE 03/27/12  . Low HDL (under 40)   . GERD (gastroesophageal reflux disease)   . Hypertension   . CAD (coronary artery disease)     5V CABG in 2004; stent to RCA x1 and SVG to acute marginal x2 2011;  PTCA/DES x 2 body of SVG to PDA  05/20/12  . CHF (congestive heart failure)      EF 55% by echo 03/27/2012  .  Depression   . Numbness and tingling in hands   . Numbness and tingling of both legs   . Umbilical hernia     unrepaired (05/20/12)  . Arthritis     hands, shoulders, arms  . Chronic lower back pain     "w/activity"  . Anxiety   . HLD (hyperlipidemia)   . Myocardial infarction 2004  . Shortness of breath     ' WHEN MY HEART ACTS UP "    Past Surgical History  Procedure Laterality Date  . Tee without cardioversion  03/27/2012    Procedure: TRANSESOPHAGEAL ECHOCARDIOGRAM (TEE);  Surgeon: Larey Dresser, MD;  Location: [facility] ENDOSCOPY;  Service: Cardiovascular;  Laterality: N/A;  to be done at 1100  . Coronary artery bypass graft  2004    5v CABG   . Incision and drainage of wound  2010    "from bite; maybe snake or spider; almost lost LLE"  . Ptca  10/30/2013    DES  SVG    . Coronary angioplasty with stent placement  05/11    stent to RCA x 1 and SVG-acute marginal x2. EF normal  . Coronary angioplasty with stent placement  05/20/12  PTCA/DES x 2 body of SVG to PDA     Current Outpatient Prescriptions  Medication Sig Dispense Refill  . aspirin 81 MG tablet Take 1 tablet (81 mg total) by mouth daily.      . citalopram (CELEXA) 40 MG tablet Take 1 tablet (40 mg total) by mouth daily.  90 tablet  3  . clopidogrel (PLAVIX) 75 MG tablet Take 1 tablet (75 mg total) by mouth daily.  90 tablet  3  . furosemide (LASIX) 20 MG tablet Take 1 tablet (20 mg total) by mouth daily.  90 tablet  3  . hydrALAZINE (APRESOLINE) 10 MG tablet Take 10 mg by mouth 2 (two) times daily.      Marland Kitchen lisinopril (PRINIVIL,ZESTRIL) 2.5 MG tablet Take 1 tablet (2.5 mg total) by mouth daily.  90 tablet  3  . metoprolol tartrate (LOPRESSOR) 25 MG tablet Take 1 tablet (25 mg total) by mouth 2 (two) times daily.  180 tablet  3  . nitroGLYCERIN (NITROSTAT) 0.4 MG SL tablet Place 1 tablet (0.4 mg total) under the tongue every 5 (five) minutes as needed for chest pain.  25 tablet  6  . pantoprazole (PROTONIX) 40 MG tablet  Take 1 tablet (40 mg total) by mouth daily.  90 tablet  3  . potassium chloride (K-DUR,KLOR-CON) 10 MEQ tablet Take 1 tablet (10 mEq total) by mouth daily.  30 tablet  6  . simvastatin (ZOCOR) 40 MG tablet Take one tablet by mouth every other day.  45 tablet  3   No current facility-administered medications for this visit.    Allergies  Allergen Reactions  . Sulfa Antibiotics Rash    History   Social History  . Marital Status: Legally Separated    Spouse Name: N/A    Number of Children: 3  . Years of Education: N/A   Occupational History  . 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 Smoking status: Former Smoker -- 3.00 packs/day for 40 years    Types: Cigarettes    Quit date: 01/23/2003  . Smokeless tobacco: Never Used  . 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 Drug Use: No     Comment: 05/20/12 "last marijuana was 2004"  . Sexual Activity: Yes   Other Topics Concern  . Not on file   Social History Narrative  . No narrative on file    Family History  Problem Relation Age of Onset  . Heart disease Mother   . Cancer Mother     ? type  . Diabetes Father   . Hyperlipidemia Father   . Hypertension Father   . Stomach cancer Father   . Heart disease Brother 67    X 5 brothers    Review of Systems:  As stated in the HPI and otherwise negative.   BP 131/79  Pulse 50  Ht 5' 6"  (1.676 m)  Wt 180 lb (81.647 kg)  BMI 29.07 kg/m2  Physical Examination: General: Well developed, well nourished, NAD HEENT: OP clear, mucus membranes moist SKIN: warm, dry. No rashes. Neuro: No focal deficits Musculoskeletal: Muscle strength 5/5 all ext Psychiatric: Mood and affect normal Neck: No JVD, no carotid bruits, no thyromegaly, no lymphadenopathy. Lungs:Clear bilaterally, no wheezes, rhonci, crackles Cardiovascular: Regular rate and rhythm. No murmurs, gallops or rubs. Abdomen:Soft. Bowel sounds present.  Non-tender.  Extremities: No lower extremity edema. Pulses are 2 + in  the bilateral DP/PT.  Cardiac cath 10/30/13: Left main: No obstructive disease noted.  Left Anterior Descending Artery: Moderate sized vessel that courses to the apex. There is diffuse proximal 80% stenosis and 100% mid occlusion. The mid and distal vessel fills from the patent LIMA graft.  Circumflex Artery: Moderate sized vessel with mild plaque. The very small caliber OM branch has 80% ostial stenosis. The Ramus intermediate branch is moderate in caliber with proximal 50% stenosis which appears unchanged from last cath.  Right Coronary Artery: Small to moderate caliber vessel with diffuse plaque. There is a patent stent mid vessel with 40-50% in-stent restenosis, unchanged from last cath. The PDA is occluded and fills from the graft. The posterolateral segment fills antegrade. There appears to be a 99% stenosis leading into the small PL segment at the site of the old graft anastamosis at the suture line. This vessel is small in caliber, 1.75 mm in diameter.  Graft Anatomy:  SVG to PDA is patent with stents present in the proximal/mid and distal body of vein graft. There is 70-80% stenosis in the ostium of the vein graft extending down into the stented segment. The mid stented segment has diffuse 60-70% restenosis. The distal stented segment has 90% restenosis. Just beyond the distal stent there is a 60-70% stenosis. The SVG limb to the PLA is known to be occluded.  SVG to OM is known to be occluded and was not selectively engaged.  SVG to Diagonal is known to be occluded and was not selectively engaged.  LIMA to LAD was patent.  Left Ventricular Angiogram: LVEF 55% with moderate MR   EKG: Sinus brady, rate 49 bpm. 1st degree AV block.   Assessment and Plan:   1. CAD: Stable. DES x 2 in the SVG to the PDA 10/30/13. Will need lifelong ASA and Plavix. Continue other cardiac meds. BP is well controlled. Lipids are well  controlled.   2. HTN: BP well controlled. No changes.   3. Hyperlipidemia:  Lipids well controlled. He takes Zocor every other day.   4. Mitral regurgitation: Moderate by TEE 2013 and by LV gram 12/14.

## 2013-12-07 ENCOUNTER — Emergency Department (HOSPITAL_COMMUNITY)
Admission: EM | Admit: 2013-12-07 | Discharge: 2013-12-08 | Disposition: A | Discharge status: Home / Self Care | Payer: Medicare Other | Attending: Emergency Medicine | Admitting: Emergency Medicine

## 2013-12-07 ENCOUNTER — Encounter (HOSPITAL_COMMUNITY): Payer: Self-pay | Admitting: Emergency Medicine

## 2013-12-07 ENCOUNTER — Emergency Department (HOSPITAL_COMMUNITY): Payer: Medicare Other

## 2013-12-07 DIAGNOSIS — S298XXA Other specified injuries of thorax, initial encounter: Secondary | ICD-10-CM | POA: Insufficient documentation

## 2013-12-07 DIAGNOSIS — I2510 Atherosclerotic heart disease of native coronary artery without angina pectoris: Secondary | ICD-10-CM | POA: Insufficient documentation

## 2013-12-07 DIAGNOSIS — Y929 Unspecified place or not applicable: Secondary | ICD-10-CM | POA: Insufficient documentation

## 2013-12-07 DIAGNOSIS — Z79899 Other long term (current) drug therapy: Secondary | ICD-10-CM | POA: Insufficient documentation

## 2013-12-07 DIAGNOSIS — X58XXXA Exposure to other specified factors, initial encounter: Secondary | ICD-10-CM | POA: Insufficient documentation

## 2013-12-07 DIAGNOSIS — Z7982 Long term (current) use of aspirin: Secondary | ICD-10-CM | POA: Insufficient documentation

## 2013-12-07 DIAGNOSIS — I10 Essential (primary) hypertension: Secondary | ICD-10-CM | POA: Insufficient documentation

## 2013-12-07 DIAGNOSIS — I509 Heart failure, unspecified: Secondary | ICD-10-CM | POA: Insufficient documentation

## 2013-12-07 DIAGNOSIS — M19029 Primary osteoarthritis, unspecified elbow: Secondary | ICD-10-CM | POA: Insufficient documentation

## 2013-12-07 DIAGNOSIS — R05 Cough: Secondary | ICD-10-CM | POA: Insufficient documentation

## 2013-12-07 DIAGNOSIS — R059 Cough, unspecified: Secondary | ICD-10-CM | POA: Insufficient documentation

## 2013-12-07 DIAGNOSIS — Z7902 Long term (current) use of antithrombotics/antiplatelets: Secondary | ICD-10-CM | POA: Insufficient documentation

## 2013-12-07 DIAGNOSIS — R0789 Other chest pain: Secondary | ICD-10-CM

## 2013-12-07 DIAGNOSIS — E785 Hyperlipidemia, unspecified: Secondary | ICD-10-CM | POA: Insufficient documentation

## 2013-12-07 DIAGNOSIS — F3289 Other specified depressive episodes: Secondary | ICD-10-CM | POA: Insufficient documentation

## 2013-12-07 DIAGNOSIS — Z87891 Personal history of nicotine dependence: Secondary | ICD-10-CM | POA: Insufficient documentation

## 2013-12-07 DIAGNOSIS — Y939 Activity, unspecified: Secondary | ICD-10-CM | POA: Insufficient documentation

## 2013-12-07 DIAGNOSIS — F329 Major depressive disorder, single episode, unspecified: Secondary | ICD-10-CM | POA: Insufficient documentation

## 2013-12-07 DIAGNOSIS — Z9861 Coronary angioplasty status: Secondary | ICD-10-CM | POA: Insufficient documentation

## 2013-12-07 DIAGNOSIS — M19019 Primary osteoarthritis, unspecified shoulder: Secondary | ICD-10-CM | POA: Insufficient documentation

## 2013-12-07 DIAGNOSIS — F411 Generalized anxiety disorder: Secondary | ICD-10-CM | POA: Insufficient documentation

## 2013-12-07 DIAGNOSIS — M19049 Primary osteoarthritis, unspecified hand: Secondary | ICD-10-CM | POA: Insufficient documentation

## 2013-12-07 DIAGNOSIS — Z951 Presence of aortocoronary bypass graft: Secondary | ICD-10-CM | POA: Insufficient documentation

## 2013-12-07 DIAGNOSIS — G8929 Other chronic pain: Secondary | ICD-10-CM | POA: Insufficient documentation

## 2013-12-07 DIAGNOSIS — I252 Old myocardial infarction: Secondary | ICD-10-CM | POA: Insufficient documentation

## 2013-12-07 DIAGNOSIS — R6889 Other general symptoms and signs: Secondary | ICD-10-CM | POA: Insufficient documentation

## 2013-12-07 DIAGNOSIS — K219 Gastro-esophageal reflux disease without esophagitis: Secondary | ICD-10-CM | POA: Insufficient documentation

## 2013-12-07 LAB — POCT I-STAT, CHEM 8
BUN: 18 mg/dL (ref 6–23)
CHLORIDE: 105 meq/L (ref 96–112)
Calcium, Ion: 1.17 mmol/L (ref 1.12–1.23)
Creatinine, Ser: 1.2 mg/dL (ref 0.50–1.35)
Glucose, Bld: 96 mg/dL (ref 70–99)
HEMATOCRIT: 40 % (ref 39.0–52.0)
HEMOGLOBIN: 13.6 g/dL (ref 13.0–17.0)
POTASSIUM: 3.8 meq/L (ref 3.7–5.3)
Sodium: 141 mEq/L (ref 137–147)
TCO2: 24 mmol/L (ref 0–100)

## 2013-12-07 IMAGING — CT CT CHEST W/ CM
2 of 3 series · 15 of 36 positions shown, 18 images · IV contrast (omnipaque)
Comparison: DG RIBS UNILATERAL W/CHEST*R* dated [DATE]; DG CHEST
2 VIEW dated [DATE]

CLINICAL DATA: Right lower rib pain for 2 days. Cough and sneezing.

EXAM:
CT CHEST WITH CONTRAST
TECHNIQUE: Multidetector CT imaging of the chest was performed during
intravenous contrast administration.
CONTRAST:  80mL OMNIPAQUE IOHEXOL 300 MG/ML  SOLN

[Series 2: chestroutine 5.0 b40f · axial · 0.76mm/px · z∈[-332,-66]mm · 12 of 63 slices shown, 15 images]
[im 5/63  mediastinal]
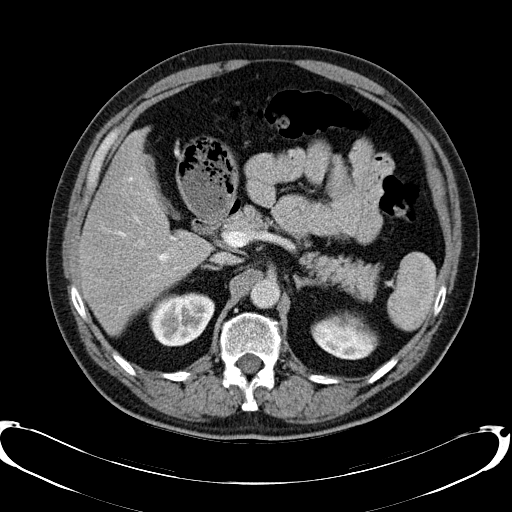
[im 5/63  lung]
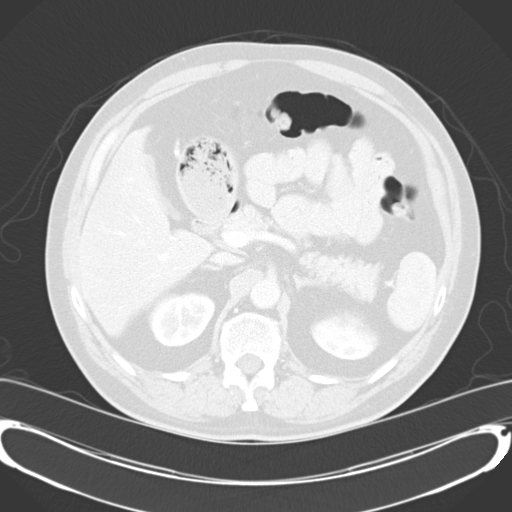
[im 10/63  lung]
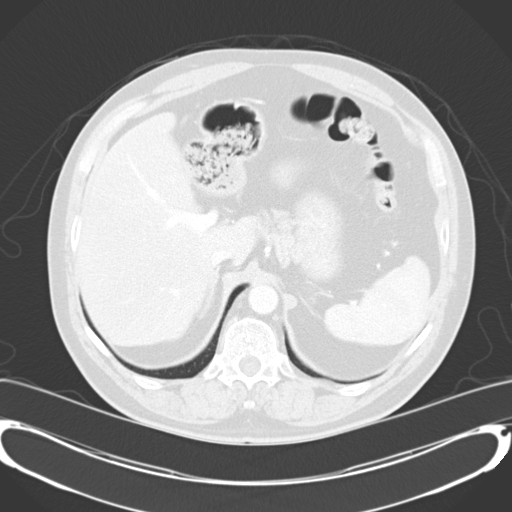
[im 14/63  lung]
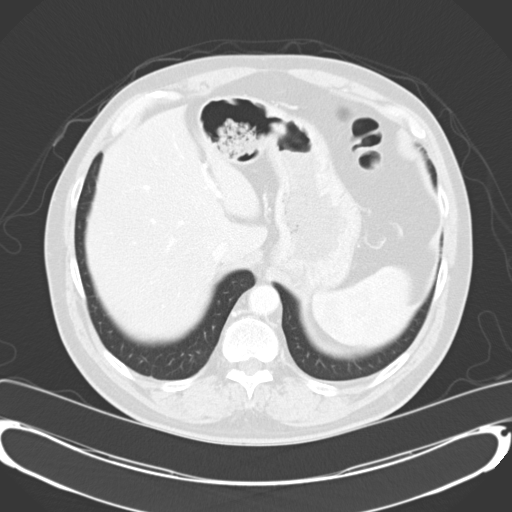
[im 19/63  lung]
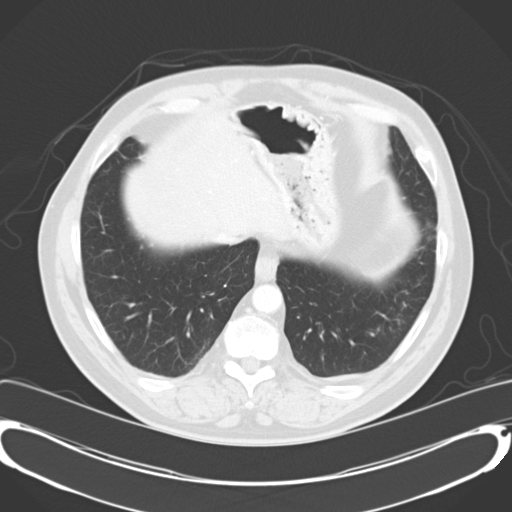
[im 23/63  mediastinal]
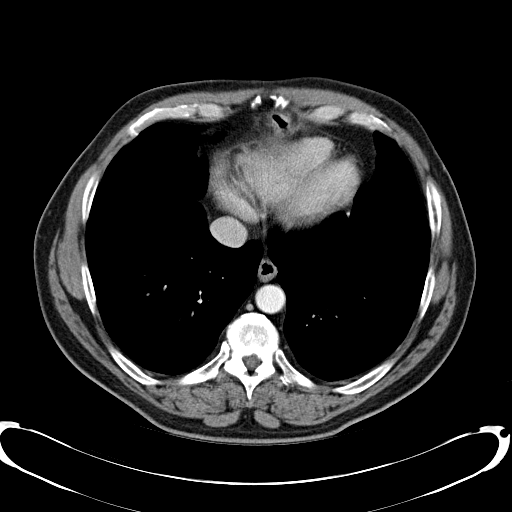
[im 23/63  lung]
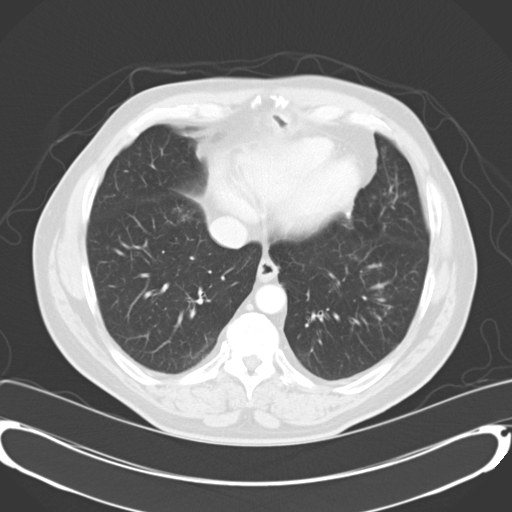
[im 28/63  lung]
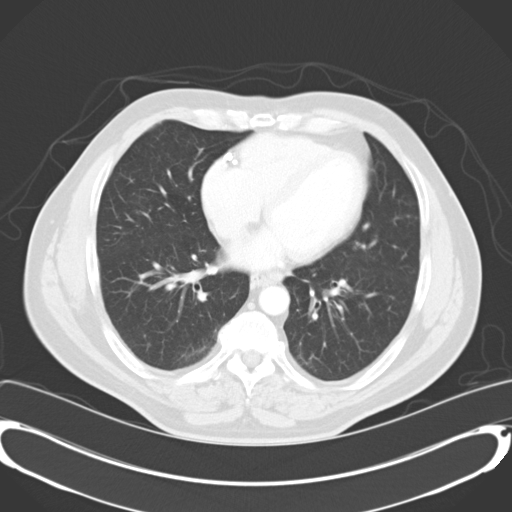
[im 35/63  lung]
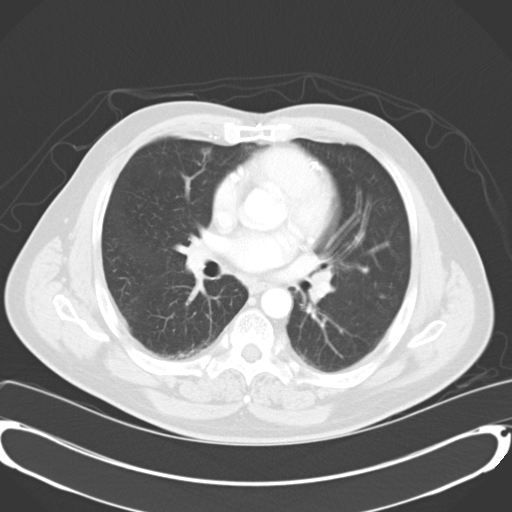
[im 40/63  lung]
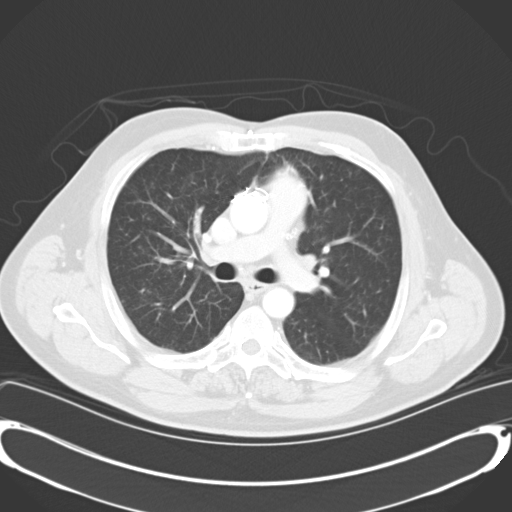
[im 44/63  mediastinal]
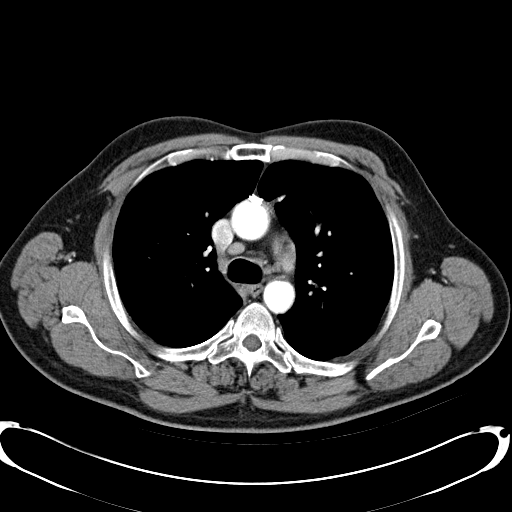
[im 44/63  lung]
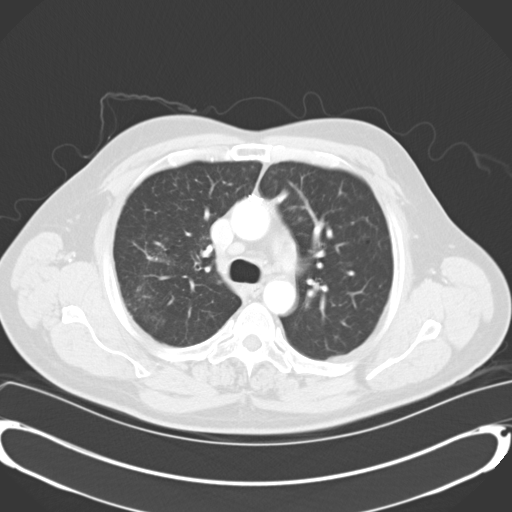
[im 49/63  lung]
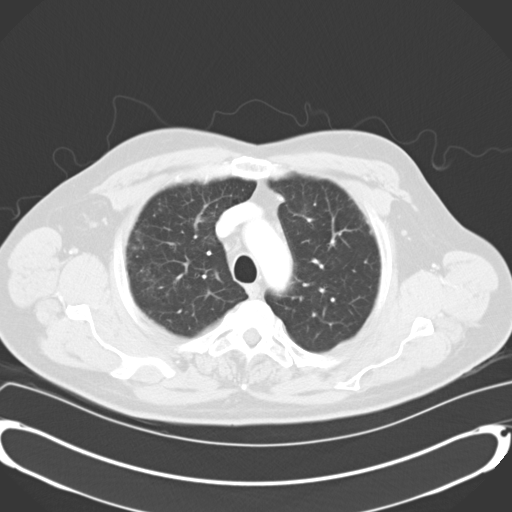
[im 53/63  lung]
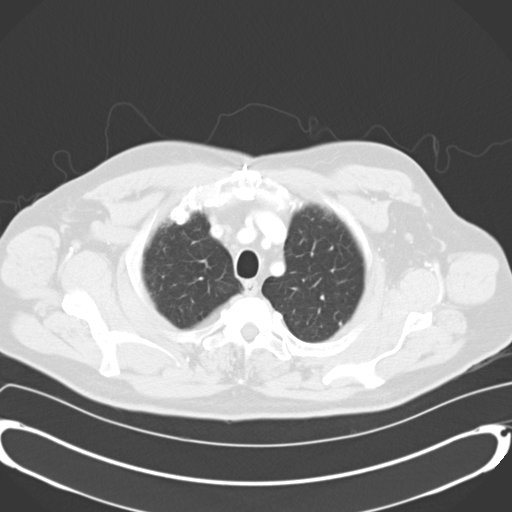
[im 58/63  lung]
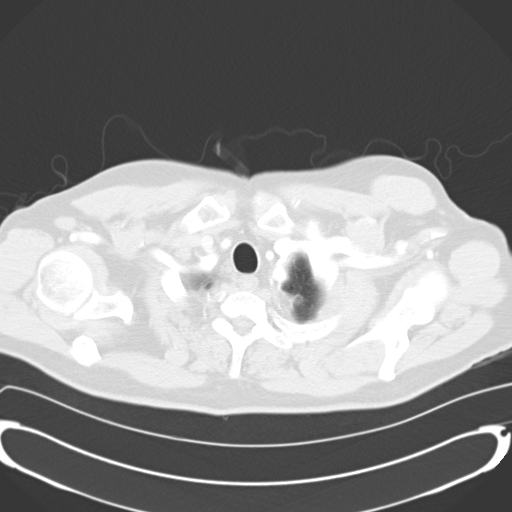

[Series 4: mpr coronal chest 3mm · coronal · 0.65mm/px · 3 of 110 slices shown]
[im 22/110  lung]
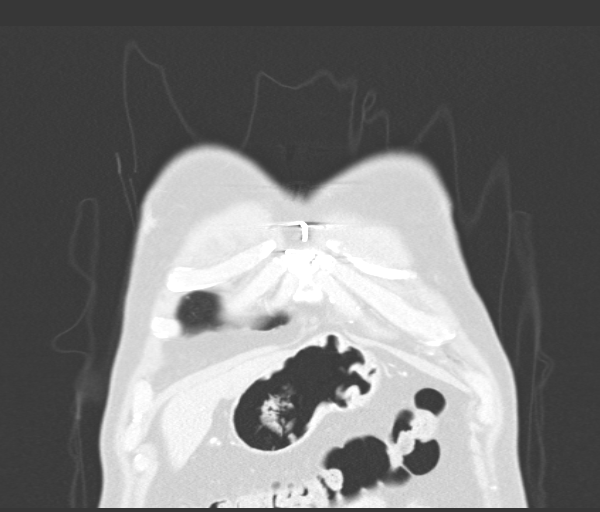
[im 44/110  lung]
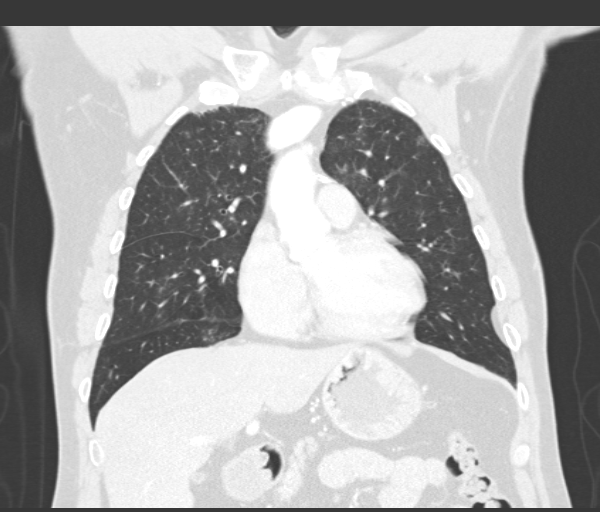
[im 66/110  lung]
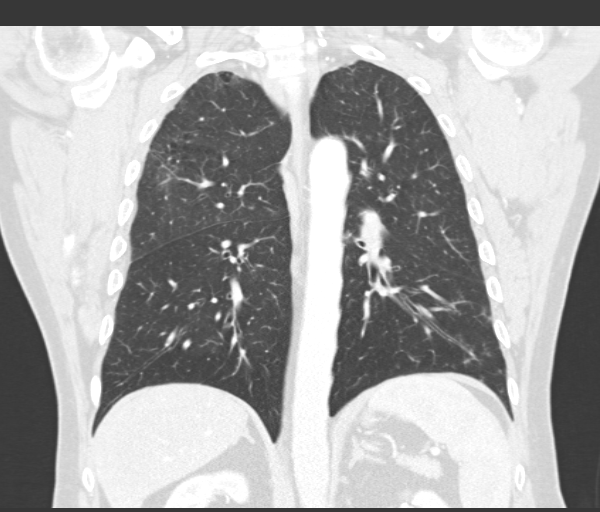

[15 of 36 positions shown; findings below may reference images not displayed]

FINDINGS: Lungs/Pleura:  Moderate centrilobular emphysema.

Extensive interstitial thickening, with micro nodularity which is
most confluent in the right upper lobe. Example image 20.

Right upper lobe irregular opacity measures 6 mm on image 30/ series
3 and image 47 sagittal. A possibly a subpleural lymph node or
secretions within the bronchial.

3 mm right lower lobe superior segment lung nodule on image 25. 3 mm
right upper lobe lung nodule on image 27.

No pleural fluid.  No pneumothorax.

Heart/Mediastinum: Normal aortic caliber without dissection. Normal
heart size, without pericardial effusion. Prior median sternotomy.
No mediastinal or hilar adenopathy.

Upper Abdomen:  No significant findings.

Bones/Musculoskeletal: No rib fracture or other acute osseous
abnormality.
IMPRESSION: 1. No evidence of rib fracture or other explanation for lower right
rib pain.
2. Centrilobular emphysema. Somewhat more clustered interstitial
opacities and micro nodularity in the right upper lobe. These are of
indeterminate acuity. Acute or subacute infection cannot be
excluded.
3. Pulmonary nodules up to 6 mm. Given the concurrent centrilobular
emphysema, follow-up chest CT at 6 - 12 months is recommended. This
recommendation follows the consensus statement: "Guidelines for
Management of Small Pulmonary Nodules Detected on CT Scans: A
Statement from the [HOSPITAL]" as published in
[L1];[DATE]. Online at:
[URL]

## 2013-12-07 IMAGING — CR DG RIBS W/ CHEST 3+V*R*
5 series · 5 of 5 positions shown · non-contrast
Comparison: [DATE]; [DATE]

CLINICAL DATA: Chest pain, right lower lateral rib pain for 2-3
days

EXAM:
RIGHT RIBS AND CHEST - 3+ VIEW

[view not recorded (1 of 5)]
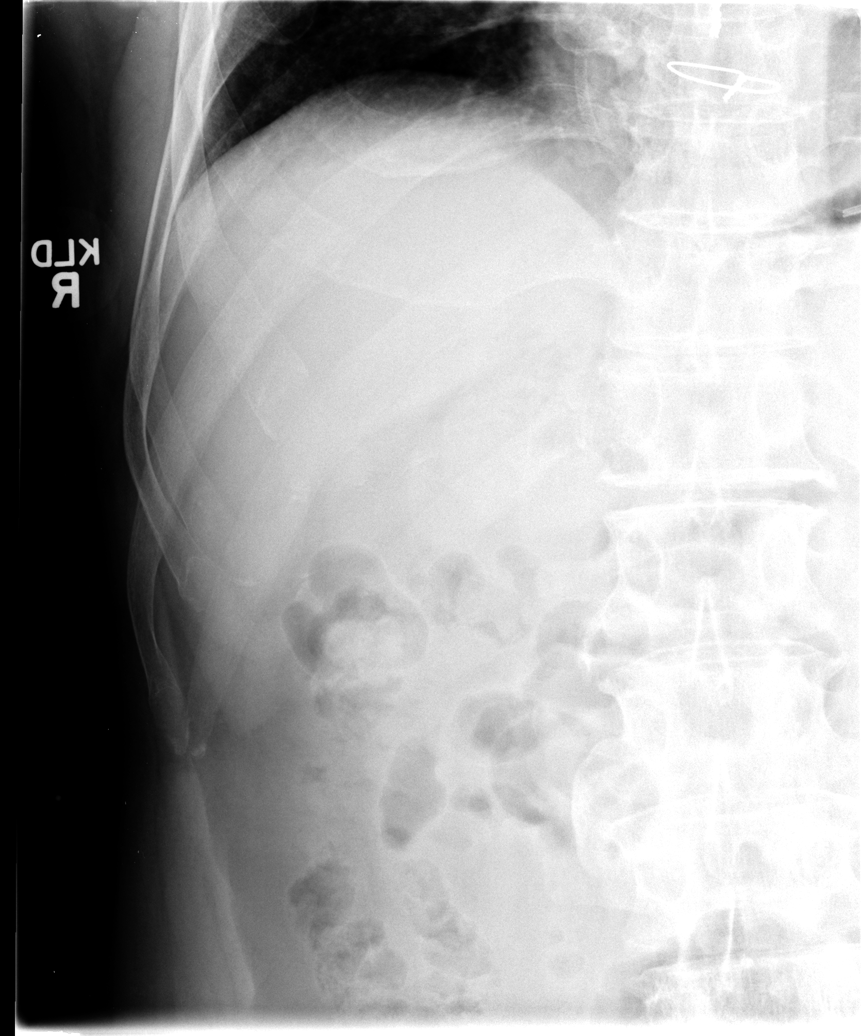

[view not recorded (2 of 5)]
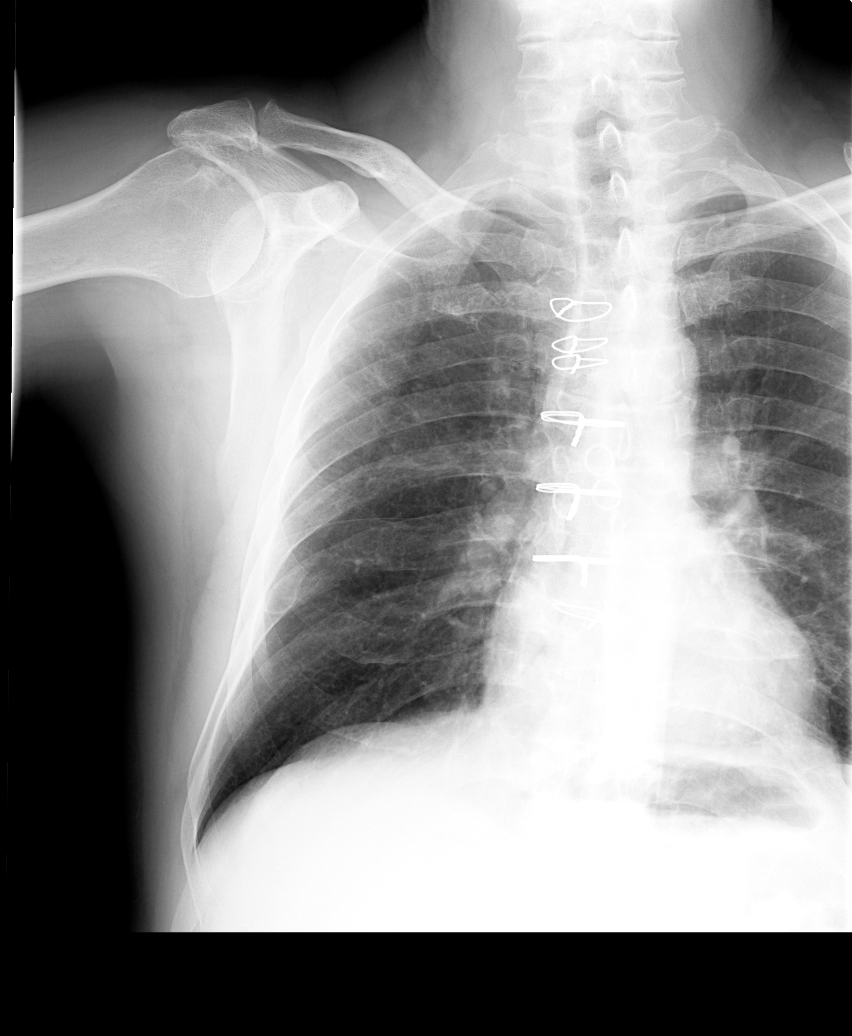

[view not recorded (3 of 5)]
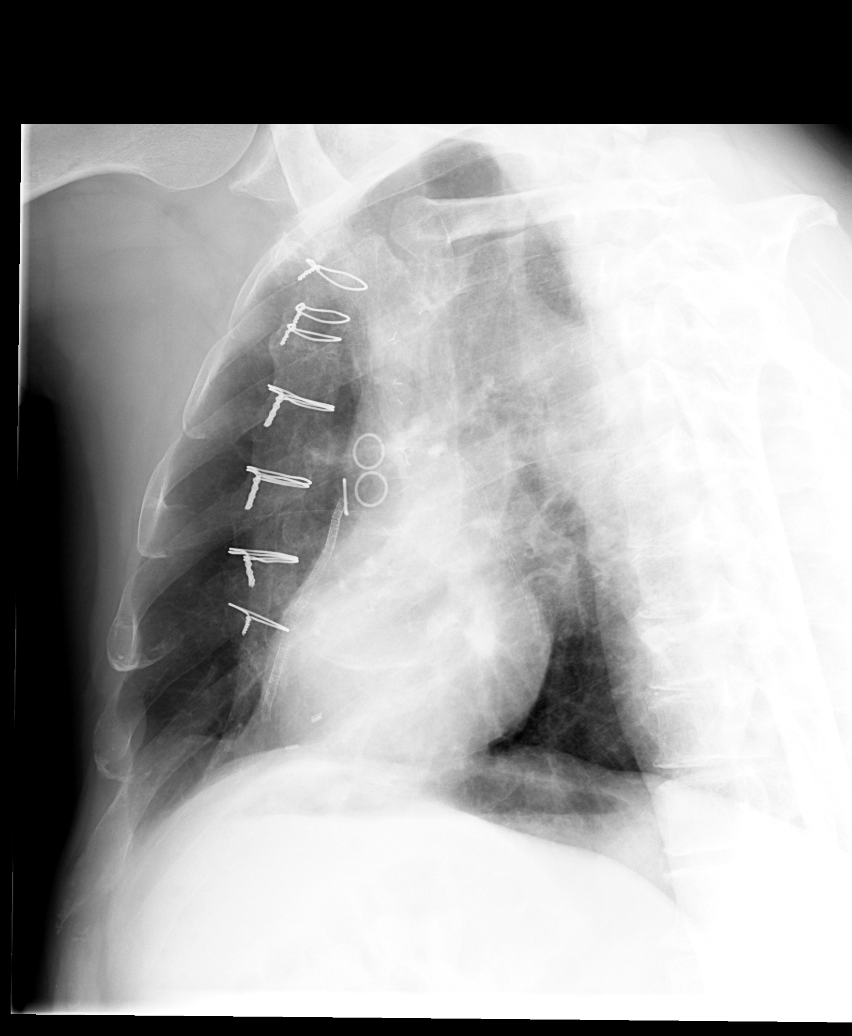

[view not recorded (4 of 5)]
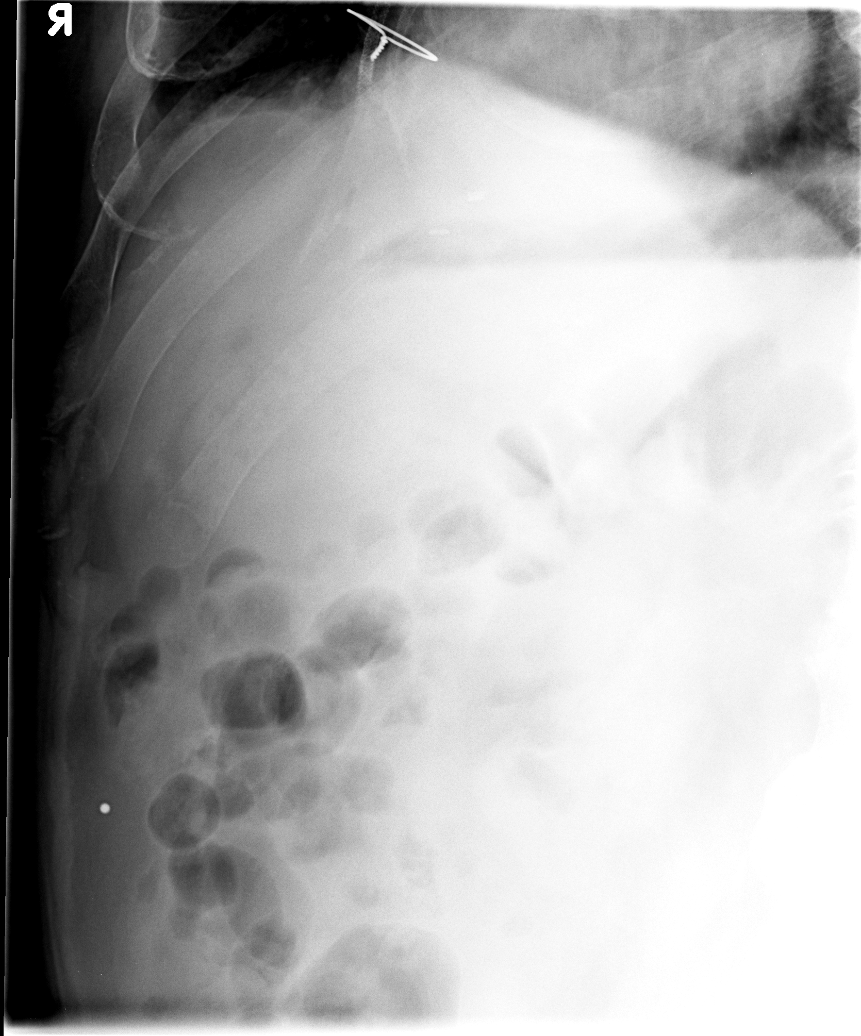

[view not recorded (5 of 5)]
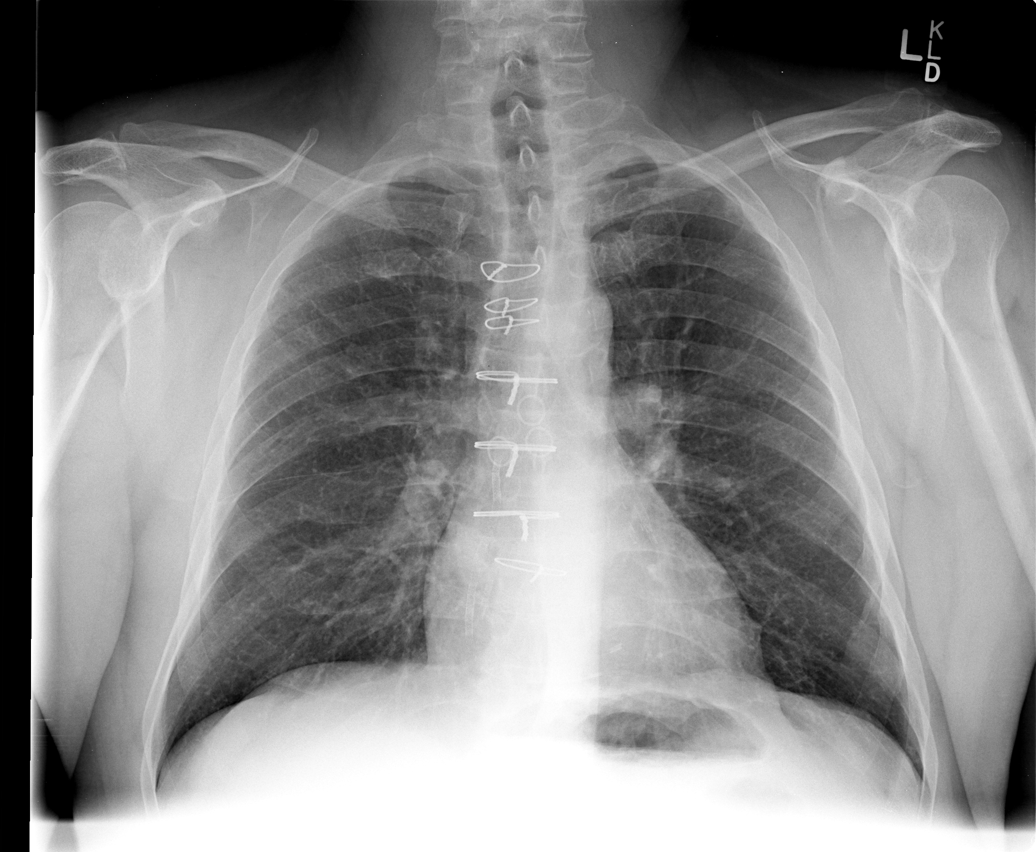

[5 of 5 positions shown; findings below may reference images not displayed]

FINDINGS: Grossly unchanged cardiac silhouette and mediastinal contours. Post
coronary artery stenting. The lungs appear hyperexpanded with
flattening of the bilateral diaphragms and mild diffuse slightly
nodular thickening of the pulmonary interstitium. No focal airspace
opacities. No pleural effusion or pneumothorax. No definite evidence
of edema.

No acute osseus abnormalities. Specifically, no definitive displaced
right-sided rib fractures with special attention paid to the area
demarcated by the radiopaque BB.
IMPRESSION: 1. Hyperexpanded lungs and bronchitic change without acute
cardiopulmonary disease.
2. No definite displaced right-sided rib fractures.

## 2013-12-07 MED ORDER — IOHEXOL 300 MG/ML  SOLN
80.0000 mL | Freq: Once | INTRAMUSCULAR | Status: AC | PRN
  Administered 2013-12-07: 80 mL via INTRAVENOUS

## 2013-12-07 MED ORDER — HYDROCODONE-ACETAMINOPHEN 5-325 MG PO TABS: 1.0000 | ORAL_TABLET | Freq: Four times a day (QID) | ORAL | Status: DC | PRN

## 2013-12-07 NOTE — ED Provider Notes (Addendum)
CSN: [id number]     Arrival date & time 12/07/13  2004 History   First MD Initiated Contact with Patient 12/07/13 2220  This chart was scribed for Benny Lennert, MD by Valera Castle, ED Scribe. This patient was seen in room APA17/APA17 and the patient's care was started at 10:33 PM.    Chief Complaint  Patient presents with  . Chest Pain  . Rib Injury    Patient is a 58 y.o. male presenting with chest pain. The history is provided by the patient. No language interpreter was used.  Chest Pain Pain location:  R chest and R lateral chest Pain radiates to the back: no   Pain severity:  Moderate Duration:  2 days Timing:  Constant Progression:  Unchanged Chronicity:  New Worsened by:  Deep breathing and movement Associated symptoms: cough   Associated symptoms: no abdominal pain, no back pain, no fatigue and no headache   Cough:    Severity:  Mild  HPI Comments: Frank [name] is a 58 y.o. male who presents to the Emergency Department complaining of constant, right, lower rib pain, onset 2 days ago. He denies any recent injury to the area. He reports he has been coughing and sneezing recently, but states he doesn't think it has been enough to injure his rib. He reports h/o broken rib, and states this pain feels like a broken rib. He states that deep breathing and movement exacerbates his rib pain. He denies pain anywhere else, and denies any other associated symptoms. He states he doesn't work since he is on disability. He denies h/o renal problems  PCP - Kevin Fenton, MD  Past Medical History  Diagnosis Date  . Mitral regurgitation     Moderate by TEE 03/27/12  . Low HDL (under 40)   . GERD (gastroesophageal reflux disease)   . Hypertension   . CAD (coronary artery disease)     5V CABG in 2004; stent to RCA x1 and SVG to acute marginal x2 2011;  PTCA/DES x 2 body of SVG to PDA  05/20/12  . CHF (congestive heart failure)      EF 55% by echo 03/27/2012  . Depression   . Numbness  and tingling in hands   . Numbness and tingling of both legs   . Umbilical hernia     unrepaired (05/20/12)  . Arthritis     hands, shoulders, arms  . Chronic lower back pain     "w/activity"  . Anxiety   . HLD (hyperlipidemia)   . Myocardial infarction 2004  . Shortness of breath     ' WHEN MY HEART ACTS UP "   Past Surgical History  Procedure Laterality Date  . Tee without cardioversion  03/27/2012    Procedure: TRANSESOPHAGEAL ECHOCARDIOGRAM (TEE);  Surgeon: Laurey Morale, MD;  Location: [facility] ENDOSCOPY;  Service: Cardiovascular;  Laterality: N/A;  to be done at 1100  . Coronary artery bypass graft  2004    5v CABG   . Incision and drainage of wound  2010    "from bite; maybe snake or spider; almost lost LLE"  . Ptca  10/30/2013    DES  SVG    . Coronary angioplasty with stent placement  05/11    stent to RCA x 1 and SVG-acute marginal x2. EF normal  . Coronary angioplasty with stent placement  05/20/12    PTCA/DES x 2 body of SVG to PDA    Family History  Problem Relation Age  of Onset  . Heart disease Mother   . Cancer Mother     ? type  . Diabetes Father   . Hyperlipidemia Father   . Hypertension Father   . Stomach cancer Father   . Heart disease Brother [id number]    X 5 brothers   History  Substance Use Topics  . Smoking status: Former Smoker -- 3.00 packs/day for 40 years    Types: Cigarettes    Quit date: 01/23/2003  . Smokeless tobacco: Never Used  . 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  HENT: Positive for sneezing. Negative for congestion, ear discharge and sinus pressure.   Eyes: Negative for discharge.  Respiratory: Positive for cough.   Cardiovascular: Positive for chest pain (right, lower rib).  Gastrointestinal: Negative for abdominal pain and diarrhea.  Genitourinary: Negative for frequency and hematuria.   Musculoskeletal: Negative for back pain.  Skin: Negative for rash.  Neurological: Negative for seizures and headaches.  Psychiatric/Behavioral: Negative for hallucinations.    Allergies  Sulfa antibiotics  Home Medications   Current Outpatient Rx  Name  Route  Sig  Dispense  Refill  . aspirin 81 MG tablet   Oral   Take 1 tablet (81 mg total) by mouth daily.         . citalopram (CELEXA) 40 MG tablet   Oral   Take 1 tablet (40 mg total) by mouth daily.   90 tablet   3   . clopidogrel (PLAVIX) 75 MG tablet   Oral   Take 1 tablet (75 mg total) by mouth daily.   90 tablet   3   . furosemide (LASIX) 20 MG tablet   Oral   Take 1 tablet (20 mg total) by mouth daily.   90 tablet   3   . hydrALAZINE (APRESOLINE) 10 MG tablet   Oral   Take 10 mg by mouth 2 (two) times daily.         Marland Kitchen. ibuprofen (ADVIL,MOTRIN) 200 MG tablet   Oral   Take 600 mg by mouth every 6 (six) hours as needed.         Marland Kitchen. lisinopril (PRINIVIL,ZESTRIL) 2.5 MG tablet   Oral   Take 1 tablet (2.5 mg total) by mouth daily.   90 tablet   3   . metoprolol tartrate (LOPRESSOR) 25 MG tablet   Oral   Take 1 tablet (25 mg total) by mouth 2 (two) times daily.   180 tablet   3     CYCLE FILL MEDICATION. Authorization is required f ...   . Multiple Vitamin (MULTIVITAMIN WITH MINERALS) TABS tablet   Oral   Take 1 tablet by mouth daily.         . nitroGLYCERIN (NITROSTAT) 0.4 MG SL tablet   Sublingual   Place 1 tablet (0.4 mg total) under the tongue every 5 (five) minutes as needed for chest pain.   25 tablet   6   . pantoprazole (PROTONIX) 40 MG tablet   Oral   Take 1 tablet (40 mg total) by mouth daily.   90 tablet   3     Take daily at 6 am   . potassium chloride (K-DUR,KLOR-CON) 10 MEQ tablet   Oral   Take 1 tablet (10 mEq total) by mouth daily.   90 tablet   3   .  simvastatin (ZOCOR) 40 MG tablet      Take one tablet by mouth every other day.   45 tablet   3   .  tetrahydrozoline-zinc (VISINE-AC) 0.05-0.25 % ophthalmic solution   Both Eyes   Place 2 drops into both eyes 3 (three) times daily as needed (itching/burning).          BP 120/61  Pulse 57  Temp(Src) 97.6 F (36.4 C) (Oral)  Resp 20  Ht 5\' 6"  (1.676 m)  Wt 180 lb (81.647 kg)  BMI 29.07 kg/m2  SpO2 96%  Physical Exam  Constitutional: He is oriented to person, place, and time. He appears well-developed.  HENT:  Head: Normocephalic.  Eyes: Conjunctivae and EOM are normal. No scleral icterus.  Neck: Neck supple. No thyromegaly present.  Cardiovascular: Normal rate, regular rhythm and normal heart sounds.  Exam reveals no gallop and no friction rub.   No murmur heard. Pulmonary/Chest: Effort normal and breath sounds normal. No stridor. No respiratory distress. He has no wheezes. He has no rales. He exhibits no tenderness.  Tenderness around right lateral rib, 9th or 10th rib.   Abdominal: He exhibits no distension. There is no tenderness. There is no rebound.  Musculoskeletal: Normal range of motion. He exhibits no edema.  Lymphadenopathy:    He has no cervical adenopathy.  Neurological: He is oriented to person, place, and time. He exhibits normal muscle tone. Coordination normal.  Skin: No rash noted. No erythema.  Psychiatric: He has a normal mood and affect. His behavior is normal.    ED Course  Procedures (including critical care time)  DIAGNOSTIC STUDIES: Oxygen Saturation is 96% on room air, normal by my interpretation.    COORDINATION OF CARE: 10:35 PM-Discussed treatment plan which includes CT chest with pt at bedside and pt agreed to plan.   Results for orders placed during the hospital encounter of 10/30/13  CBC      Result Value Range   WBC 8.6  4.0 - 10.5 K/uL   RBC 4.52  4.22 - 5.81 MIL/uL   Hemoglobin 13.5  13.0 - 17.0 g/dL   HCT 16.1  09.6 - 04.5 %   MCV 88.7  78.0 - 100.0 fL   MCH 29.9  26.0 - 34.0 pg   MCHC 33.7  30.0 - 36.0 g/dL   RDW 40.9  81.1 -  91.4 %   Platelets 169  150 - 400 K/uL  BASIC METABOLIC PANEL      Result Value Range   Sodium 141  135 - 145 mEq/L   Potassium 4.0  3.5 - 5.1 mEq/L   Chloride 102  96 - 112 mEq/L   CO2 27  19 - 32 mEq/L   Glucose, Bld 125 (*) 70 - 99 mg/dL   BUN 13  6 - 23 mg/dL   Creatinine, Ser 7.82  0.50 - 1.35 mg/dL   Calcium 9.1  8.4 - 95.6 mg/dL   GFR calc non Af Amer 68 (*) >90 mL/min   GFR calc Af Amer 79 (*) >90 mL/min  POCT ACTIVATED CLOTTING TIME      Result Value Range   Activated Clotting Time 393     Dg Ribs Unilateral W/chest Right  12/07/2013   CLINICAL DATA:  Chest pain, right lower lateral rib pain for 2-3 days  EXAM: RIGHT RIBS AND CHEST - 3+ VIEW  COMPARISON:  04/06/2010; 12/23/2003  FINDINGS: Grossly unchanged cardiac silhouette and mediastinal contours. Post coronary artery stenting. The lungs appear hyperexpanded  with flattening of the bilateral diaphragms and mild diffuse slightly nodular thickening of the pulmonary interstitium. No focal airspace opacities. No pleural effusion or pneumothorax. No definite evidence of edema.  No acute osseus abnormalities. Specifically, no definitive displaced right-sided rib fractures with special attention paid to the area demarcated by the radiopaque BB.  IMPRESSION: 1. Hyperexpanded lungs and bronchitic change without acute cardiopulmonary disease. 2. No definite displaced right-sided rib fractures.   Electronically Signed   By: Simonne Come M.D.   On: 12/07/2013 20:47    EKG Interpretation   None      Medications - No data to display  Date: 12/07/2013  Rate:55  Rhythm: sinus bradycardia  QRS Axis: normal  Intervals: normal  ST/T Wave abnormalities: nonspecific ST changes  Conduction Disutrbances:none  Narrative Interpretation:   Old EKG Reviewed: unchanged   MDM  Chest wall pain,   6 mm lung nodule,  Pt told to get ct chest in 6-12 months  Benny Lennert, MD 12/07/13 2358  Benny Lennert, MD 12/08/13 [phone]

## 2013-12-07 NOTE — ED Notes (Signed)
Patient reports right lower rib pain x 2 days. Denies injury, reports has been coughing.

## 2013-12-07 NOTE — Discharge Instructions (Signed)
Follow up with your md next week for recheck °

## 2013-12-08 MED ORDER — HYDROCODONE-ACETAMINOPHEN 5-325 MG PO TABS
1.0000 | ORAL_TABLET | Freq: Once | ORAL | Status: AC
  Administered 2013-12-08: 1 via ORAL
  Filled 2013-12-08: qty 1

## 2013-12-10 ENCOUNTER — Telehealth: Payer: Self-pay | Admitting: Family Medicine

## 2013-12-10 NOTE — Telephone Encounter (Signed)
Called patient back to discuss CT  and CXR findings.   Patient's wife is concerned he has shingles. The CT found, I believe, 3 nodules ranging 3-6 mm needing follow-up CT in 6 months.   He is still having severe right sided pain and she says he has a light rash at the area. Described as a burning type pain worsened by movement. He was given vicoden and states that he got a rash all over and severe itching with that.   I told her there is definitely a possibility of shingles and I advised getting a SDA tomorrow. She says she'll call.   He is breathing fine, no nasuea but does have a reduced appetite.   Murtis SinkSam Parv Manthey, MD [facility] Resident, PGY-2 12/10/2013, 4:12 PM

## 2013-12-10 NOTE — Telephone Encounter (Signed)
I will call patient with results once reviewed by MD.  Radene Ou[place], Liannah Yarbough L, CMA

## 2013-12-10 NOTE — Telephone Encounter (Signed)
Pt was seen in the ER on 1/19. He went there because a pain on his side. The did x-rays and found nothing. He was wondering if a nurse can call him to go over the results. I ask him him if he wanted to come in today but wanted to wait until next week and see Dr. Ermalinda MemosBradshaw. Myriam Jacobsonjw

## 2013-12-11 ENCOUNTER — Ambulatory Visit (INDEPENDENT_AMBULATORY_CARE_PROVIDER_SITE_OTHER): Payer: Medicare Other | Admitting: Family Medicine

## 2013-12-11 VITALS — BP 121/79 | HR 48 | Temp 98.0°F | Ht 66.0 in | Wt 180.9 lb

## 2013-12-11 DIAGNOSIS — R918 Other nonspecific abnormal finding of lung field: Secondary | ICD-10-CM

## 2013-12-11 DIAGNOSIS — R0781 Pleurodynia: Secondary | ICD-10-CM

## 2013-12-11 DIAGNOSIS — R079 Chest pain, unspecified: Secondary | ICD-10-CM

## 2013-12-11 MED ORDER — MELOXICAM 15 MG PO TABS: 15.0000 mg | ORAL_TABLET | Freq: Every day | ORAL | Status: DC

## 2013-12-11 MED ORDER — DICLOFENAC SODIUM 3 % TD GEL: TRANSDERMAL | Status: DC

## 2013-12-11 NOTE — Assessment & Plan Note (Signed)
A: rib pain without fracture or tumor of rib, likely due to MSK strain P:  - Stop ibuprofen as it can interfere with anti-platelet properties of clopidogrel and favor use of Meloxicam b/c it is once daily and has less anti-platelet affect - Stop Norco since he had hives - Given rx for topical NSAIDS to use as needed

## 2013-12-11 NOTE — Patient Instructions (Signed)
Mr. Frank [name],   It was nice to meet you. We need to do the following things for rib pain.  1. Stop taking ibuprofen b/c this can interfere with plavix 2. Start taking meloxicam once daily 3. Use the cream that you have at home and if not effective then you can fill the prescription for topical diclofenac.   Come back in 2 weeks if needed.   Sincerely,   Dr. Clinton SawyerWilliamson

## 2013-12-11 NOTE — Assessment & Plan Note (Signed)
A: numerous pulmonary nodules noted on CT scan 12/07/13 with the largest at 6mm located in RUL; pt with 120 pack year hx but no longer smoking P: Follow up CT scan in 6-12 mos in accordance with "Guidelines for Management of Small Pulmonary Nodules Detected on CT Scans: A Statement from the Fleischner Society" as published in Radiology2005;237:395-400.

## 2013-12-11 NOTE — Progress Notes (Signed)
   Subjective:    Patient ID: Frank [name], male    DOB: 06/05/1956, 58 y.o.   MRN: [id number]  HPI  58 year old M with CAD s/p CABG in 2014 and cath with placement of 2 DES in 12/14 who presents with right sided rib pain. Pt with presentation to the ED on 12/07/13. CT scan from ED did not demonstrate any rib fracture, but did show pulmonary nodules and emphysema. He was prescribed Norco, which he tried but it caused hives. He is taking ibuprofen 600 mg q 6 hours for pain, which does provide some relief. Currently he states that it feels "like a broken rib." No recent trauma or fall. No respiratory illness. No rash on flank. Does have hx of shingles but denies that this pain is similar. Pain improving over the last week when it started.   PMH - CHF, CAD s/p stenting 2 weeks ago  Review of Systems denies chest pain, shortness of breath, fever, chills     Objective:   Physical Exam BP 121/79  Pulse 48  Temp(Src) 98 F (36.7 C) (Oral)  Ht 5\' 6"  (1.676 m)  Wt 180 lb 14.4 oz (82.056 kg)  BMI 29.21 kg/m2 Gen: middle aged male, well appearing, NAD, pleasant and conversant MSK: normal appearing ribs, mild TTP of 9th right rib at mid-axillar line but no palpable abnormality, no edema  Skin: no rashes, no allodynia of rib  Follow studies reviewed.   Dg Ribs Unilateral W/chest Right   IMPRESSION: 1. Hyperexpanded lungs and bronchitic change without acute cardiopulmonary disease. 2. No definite displaced right-sided rib fractures.   Electronically Signed   By: Simonne ComeJohn  Watts M.D.   On: 12/07/2013 20:47   Ct Chest W Contrast  12/08/2013   CLINICAL DATA:  Right lower rib pain for 2 days. Cough and sneezing.  EXAM: CT CHEST WITH CONTRAST  TECHNIQUE:  IMPRESSION: 1. No evidence of rib fracture or other explanation for lower right rib pain. 2. Centrilobular emphysema. Somewhat more clustered interstitial opacities and micro nodularity in the right upper lobe. These are of indeterminate acuity. Acute or  subacute infection cannot be excluded. 3. Pulmonary nodules up to 6 mm. Given the concurrent centrilobular emphysema, follow-up chest CT at 6 - 12 months is recommended. This recommendation follows the consensus statement: "Guidelines for Management of Small Pulmonary Nodules Detected on CT Scans: A Statement from the Fleischner Society" as published in Radiology2005;237:395-400. Online at: DietDisorder.czhttp://www.med.[employer].edu/rad/res/Fleischner-nodule.htm.   Electronically Signed   By: Jeronimo GreavesKyle  Talbot M.D.   On: 12/08/2013 00:05        Assessment & Plan:

## 2013-12-17 ENCOUNTER — Ambulatory Visit: Payer: Medicare Other | Admitting: Family Medicine

## 2013-12-26 ENCOUNTER — Encounter (HOSPITAL_COMMUNITY): Payer: Self-pay | Admitting: Emergency Medicine

## 2013-12-26 ENCOUNTER — Emergency Department (HOSPITAL_COMMUNITY)
Admission: EM | Admit: 2013-12-26 | Discharge: 2013-12-26 | Disposition: A | Discharge status: Home / Self Care | Payer: Medicare Other | Attending: Emergency Medicine | Admitting: Emergency Medicine

## 2013-12-26 DIAGNOSIS — G8929 Other chronic pain: Secondary | ICD-10-CM | POA: Insufficient documentation

## 2013-12-26 DIAGNOSIS — F329 Major depressive disorder, single episode, unspecified: Secondary | ICD-10-CM | POA: Insufficient documentation

## 2013-12-26 DIAGNOSIS — L500 Allergic urticaria: Secondary | ICD-10-CM | POA: Insufficient documentation

## 2013-12-26 DIAGNOSIS — Z87891 Personal history of nicotine dependence: Secondary | ICD-10-CM | POA: Insufficient documentation

## 2013-12-26 DIAGNOSIS — Z7902 Long term (current) use of antithrombotics/antiplatelets: Secondary | ICD-10-CM | POA: Insufficient documentation

## 2013-12-26 DIAGNOSIS — Z7982 Long term (current) use of aspirin: Secondary | ICD-10-CM | POA: Insufficient documentation

## 2013-12-26 DIAGNOSIS — F3289 Other specified depressive episodes: Secondary | ICD-10-CM | POA: Insufficient documentation

## 2013-12-26 DIAGNOSIS — I10 Essential (primary) hypertension: Secondary | ICD-10-CM | POA: Insufficient documentation

## 2013-12-26 DIAGNOSIS — Z9861 Coronary angioplasty status: Secondary | ICD-10-CM | POA: Insufficient documentation

## 2013-12-26 DIAGNOSIS — F411 Generalized anxiety disorder: Secondary | ICD-10-CM | POA: Insufficient documentation

## 2013-12-26 DIAGNOSIS — Z791 Long term (current) use of non-steroidal anti-inflammatories (NSAID): Secondary | ICD-10-CM | POA: Insufficient documentation

## 2013-12-26 DIAGNOSIS — Z79899 Other long term (current) drug therapy: Secondary | ICD-10-CM | POA: Insufficient documentation

## 2013-12-26 DIAGNOSIS — K219 Gastro-esophageal reflux disease without esophagitis: Secondary | ICD-10-CM | POA: Insufficient documentation

## 2013-12-26 DIAGNOSIS — L509 Urticaria, unspecified: Secondary | ICD-10-CM

## 2013-12-26 DIAGNOSIS — I252 Old myocardial infarction: Secondary | ICD-10-CM | POA: Insufficient documentation

## 2013-12-26 DIAGNOSIS — I509 Heart failure, unspecified: Secondary | ICD-10-CM | POA: Insufficient documentation

## 2013-12-26 DIAGNOSIS — IMO0001 Reserved for inherently not codable concepts without codable children: Secondary | ICD-10-CM | POA: Insufficient documentation

## 2013-12-26 DIAGNOSIS — M19029 Primary osteoarthritis, unspecified elbow: Secondary | ICD-10-CM | POA: Insufficient documentation

## 2013-12-26 DIAGNOSIS — M19019 Primary osteoarthritis, unspecified shoulder: Secondary | ICD-10-CM | POA: Insufficient documentation

## 2013-12-26 DIAGNOSIS — Z951 Presence of aortocoronary bypass graft: Secondary | ICD-10-CM | POA: Insufficient documentation

## 2013-12-26 DIAGNOSIS — M19049 Primary osteoarthritis, unspecified hand: Secondary | ICD-10-CM | POA: Insufficient documentation

## 2013-12-26 DIAGNOSIS — E786 Lipoprotein deficiency: Secondary | ICD-10-CM | POA: Insufficient documentation

## 2013-12-26 DIAGNOSIS — T7840XA Allergy, unspecified, initial encounter: Secondary | ICD-10-CM

## 2013-12-26 DIAGNOSIS — I2510 Atherosclerotic heart disease of native coronary artery without angina pectoris: Secondary | ICD-10-CM | POA: Insufficient documentation

## 2013-12-26 MED ORDER — SODIUM CHLORIDE 0.9 % IV BOLUS (SEPSIS)
250.0000 mL | Freq: Once | INTRAVENOUS | Status: AC
  Administered 2013-12-26: 22:00:00 via INTRAVENOUS

## 2013-12-26 MED ORDER — DIPHENHYDRAMINE HCL 25 MG PO TABS: 25.0000 mg | ORAL_TABLET | Freq: Four times a day (QID) | ORAL | Status: DC

## 2013-12-26 MED ORDER — FAMOTIDINE 20 MG PO TABS: 20.0000 mg | ORAL_TABLET | Freq: Two times a day (BID) | ORAL | Status: DC

## 2013-12-26 MED ORDER — METHYLPREDNISOLONE SODIUM SUCC 125 MG IJ SOLR
125.0000 mg | Freq: Once | INTRAMUSCULAR | Status: AC
  Administered 2013-12-26: 125 mg via INTRAVENOUS
  Filled 2013-12-26: qty 2

## 2013-12-26 MED ORDER — FAMOTIDINE IN NACL 20-0.9 MG/50ML-% IV SOLN
20.0000 mg | Freq: Once | INTRAVENOUS | Status: AC
  Administered 2013-12-26: 20 mg via INTRAVENOUS
  Filled 2013-12-26: qty 50

## 2013-12-26 MED ORDER — PREDNISONE 10 MG PO TABS: 40.0000 mg | ORAL_TABLET | Freq: Every day | ORAL | Status: DC

## 2013-12-26 MED ORDER — SODIUM CHLORIDE 0.9 % IV SOLN
INTRAVENOUS | Status: DC
  Administered 2013-12-26: 22:00:00 via INTRAVENOUS

## 2013-12-26 MED ORDER — DIPHENHYDRAMINE HCL 50 MG/ML IJ SOLN
25.0000 mg | Freq: Once | INTRAMUSCULAR | Status: AC
  Administered 2013-12-26: 25 mg via INTRAVENOUS
  Filled 2013-12-26: qty 1

## 2013-12-26 NOTE — ED Notes (Signed)
Pt started itching all over body around 5pm. Pt has put benadryl cream on rash but it has not improved. No new meds, no diet change, and has not switched laundry detergent.

## 2013-12-26 NOTE — Discharge Instructions (Signed)
Allergies  Allergies may happen from anything your body is sensitive to. This may be food, medicines, pollens, chemicals, and many other things. Food allergies can be severe and deadly.  HOME CARE  If you do not know what causes a reaction, keep a diary. Write down the foods you ate and the symptoms that followed. Avoid foods that cause reactions.  If you have red raised spots (hives) or a rash:  Take medicine as told by your doctor.  Use medicines for red raised spots and itching as needed.  Apply cold cloths (compresses) to the skin. Take a cool bath. Avoid hot baths or showers.  If you are severely allergic:  It is often necessary to go to the hospital after you have treated your reaction.  Wear your medical alert jewelry.  You and your family must learn how to give a allergy shot or use an allergy kit (anaphylaxis kit).  Always carry your allergy kit or shot with you. Use this medicine as told by your doctor if a severe reaction is occurring. GET HELP RIGHT AWAY IF:  You have trouble breathing or are making high-pitched whistling sounds (wheezing).  You have a tight feeling in your chest or throat.  You have a puffy (swollen) mouth.  You have red raised spots, puffiness (swelling), or itching all over your body.  You have had a severe reaction that was helped by your allergy kit or shot. The reaction can return once the medicine has worn off.  You think you are having a food allergy. Symptoms most often happen within 30 minutes of eating a food.  Your symptoms have not gone away within 2 days or are getting worse.  You have new symptoms.  You want to retest yourself with a food or drink you think causes an allergic reaction. Only do this under the care of a doctor. MAKE SURE YOU:   Understand these instructions.  Will watch your condition.  Will get help right away if you are not doing well or get worse. Document Released: 03/02/2013 Document Reviewed:  03/02/2013 [facility] Patient Information 2014 [place], [place].  Angioedema Angioedema is sudden puffiness (swelling), often of the skin. It can happen:  On your face or privates (genitals).  In your belly (abdomen) or other body parts. It usually happens quickly and gets better in 1 or 2 days. It often starts at night and is found when you wake up. You may get red, itchy patches of skin (hives). Attacks can be dangerous if your breathing passages get puffy. The condition may happen only once, or it can come back at random times. It may happen for several years before it goes away for good. HOME CARE  Only take medicines as told by your doctor.  Always carry your emergency allergy medicines with you.  Wear a medical bracelet as told by your doctor.  Avoid things that you know will cause attacks (triggers). GET HELP IF:  You have another attack.  Your attacks happen more often or get worse.  The condition was passed to you by your parents and you want to have children. GET HELP RIGHT AWAY IF:   Your mouth, tongue, or lips are very puffy.  You have trouble breathing.  You have trouble swallowing.  You pass out (faint). MAKE SURE YOU:   Understand these instructions.  Will watch your condition.  Will get help right away if you are not doing well or get worse. Document Released: 10/24/2009 Document Revised: 08/26/2013 Document Reviewed:  06/29/2013 [employer] Patient Information 2014 [place].   Take medications as directed. Recommend taking the Benadryl for the next 2 days every 6 hours. Take the Pepcid for the next 7 days and take the prednisone for the next 5 days. Return for any tongue swelling or trouble breathing or any newer worse symptoms. Would recommend avoiding strawberries in the future.

## 2013-12-26 NOTE — ED Provider Notes (Signed)
CSN: [id number]     Arrival date & time 12/26/13  2038 History  This chart was scribed for Shelda Jakes, MD by Dorothey Baseman, ED Scribe. This patient was seen in room APA17/APA17 and the patient's care was started at 9:34 PM.    Chief Complaint  Patient presents with  . Allergic Reaction   Patient is a 58 y.o. male presenting with allergic reaction. The history is provided by the patient. No language interpreter was used.  Allergic Reaction Presenting symptoms: itching and rash   Presenting symptoms: no difficulty breathing and no swelling   Itching:    Location:  Full body   Severity:  Moderate   Onset quality:  Sudden   Timing:  Constant   Progression:  Worsening Severity:  Mild Prior allergic episodes:  Allergies to medications Context: food   Relieved by:  Nothing Worsened by:  Nothing tried Ineffective treatments:  Antihistamines and OTC ointments (Benadryl cream)  HPI Comments: Frank [name] is a 58 y.o. male who presents to the Emergency Department complaining of a possible allergic reaction including an itching urticarial rash diffusely over his body onset around 4.5 hours ago that has been progressively worsening. He states that his symptoms presented after eating strawberries, but denies prior allergic reaction to this. He states that this type of reaction is new for him. Patient reports applying Benadryl cream to the areas without relief. He denies tongue swelling, difficulty breathing. He reports allergies to sulfa antibiotics and hydrocodone. He denies fever, chills, visual disturbance, cough, rhinorrhea, sore throat, chest pain, shortness of breath, abdominal pain, nausea, emesis, diarrhea, blood in the stool, dysuria, leg swelling, myalgias, headache, or history of bleeding easily. Patient has a history of GERD, HTN, CAD, CHF, arthritis, anxiety, and MI.   Past Medical History  Diagnosis Date  . Mitral regurgitation     Moderate by TEE 03/27/12  . Low HDL (under 40)   .  GERD (gastroesophageal reflux disease)   . Hypertension   . CAD (coronary artery disease)     5V CABG in 2004; stent to RCA x1 and SVG to acute marginal x2 2011;  PTCA/DES x 2 body of SVG to PDA  05/20/12  . CHF (congestive heart failure)      EF 55% by echo 03/27/2012  . Depression   . Numbness and tingling in hands   . Numbness and tingling of both legs   . Umbilical hernia     unrepaired (05/20/12)  . Arthritis     hands, shoulders, arms  . Chronic lower back pain     "w/activity"  . Anxiety   . HLD (hyperlipidemia)   . Myocardial infarction 2004  . Shortness of breath     ' WHEN MY HEART ACTS UP "   Past Surgical History  Procedure Laterality Date  . Tee without cardioversion  03/27/2012    Procedure: TRANSESOPHAGEAL ECHOCARDIOGRAM (TEE);  Surgeon: Laurey Morale, MD;  Location: [facility] ENDOSCOPY;  Service: Cardiovascular;  Laterality: N/A;  to be done at 1100  . Coronary artery bypass graft  2004    5v CABG   . Incision and drainage of wound  2010    "from bite; maybe snake or spider; almost lost LLE"  . Ptca  10/30/2013    DES  SVG    . Coronary angioplasty with stent placement  05/11    stent to RCA x 1 and SVG-acute marginal x2. EF normal  . Coronary angioplasty with stent placement  05/20/12    PTCA/DES x 2 body of SVG to PDA    Family History  Problem Relation Age of Onset  . Heart disease Mother   . Cancer Mother     ? type  . Diabetes Father   . Hyperlipidemia Father   . Hypertension Father   . Stomach cancer Father   . Heart disease Brother 55    X 5 brothers   History  Substance Use Topics  . Smoking status: Former Smoker -- 3.00 packs/day for 40 years    Types: Cigarettes    Quit date: 01/23/2003  . Smokeless tobacco: Never Used  . 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  HENT: Negative for rhinorrhea and sore throat.    Eyes: Negative for visual disturbance.  Respiratory: Negative for cough and shortness of breath.   Cardiovascular: Negative for chest pain and leg swelling.  Gastrointestinal: Negative for nausea, vomiting, abdominal pain, diarrhea and blood in stool.  Genitourinary: Negative for dysuria.  Musculoskeletal: Positive for myalgias.  Skin: Positive for itching and rash.  Neurological: Negative for headaches.  Hematological: Does not bruise/bleed easily.  Psychiatric/Behavioral: Negative for confusion.    Allergies  Sulfa antibiotics and Hydrocodone  Home Medications   Current Outpatient Rx  Name  Route  Sig  Dispense  Refill  . aspirin EC 81 MG tablet   Oral   Take 81 mg by mouth daily.         . citalopram (CELEXA) 40 MG tablet   Oral   Take 1 tablet (40 mg total) by mouth daily.   90 tablet   3   . clopidogrel (PLAVIX) 75 MG tablet   Oral   Take 1 tablet (75 mg total) by mouth daily.   90 tablet   3   . Diclofenac Sodium 3 % GEL   Transdermal   Place 1 application onto the skin 2 (two) times daily as needed. pain         . furosemide (LASIX) 20 MG tablet   Oral   Take 1 tablet (20 mg total) by mouth daily.   90 tablet   3   . hydrALAZINE (APRESOLINE) 10 MG tablet   Oral   Take 10 mg by mouth 2 (two) times daily.         Marland Kitchen HYDROcodone-acetaminophen (NORCO/VICODIN) 5-325 MG per tablet   Oral   Take 1 tablet by mouth every 6 (six) hours as needed for moderate pain.   20 tablet   0   . ibuprofen (ADVIL,MOTRIN) 200 MG tablet   Oral   Take 600 mg by mouth every 6 (six) hours as needed. pain         . lisinopril (PRINIVIL,ZESTRIL) 2.5 MG tablet   Oral   Take 1 tablet (2.5 mg total) by mouth daily.   90 tablet   3   . meloxicam (MOBIC) 15 MG tablet   Oral   Take 1 tablet (15 mg total) by mouth daily.   30 tablet   0   . metoprolol tartrate (LOPRESSOR) 25 MG tablet   Oral   Take 1 tablet (25 mg total) by mouth 2 (two) times daily.   180  tablet   3     CYCLE FILL MEDICATION. Authorization is required f ...   . Multiple Vitamin (MULTIVITAMIN WITH MINERALS)  TABS tablet   Oral   Take 1 tablet by mouth daily.         . pantoprazole (PROTONIX) 40 MG tablet   Oral   Take 1 tablet (40 mg total) by mouth daily.   90 tablet   3     Take daily at 6 am   . potassium chloride (K-DUR,KLOR-CON) 10 MEQ tablet   Oral   Take 1 tablet (10 mEq total) by mouth daily.   90 tablet   3   . simvastatin (ZOCOR) 40 MG tablet      Take one tablet by mouth every other day.   45 tablet   3   . tetrahydrozoline-zinc (VISINE-AC) 0.05-0.25 % ophthalmic solution   Both Eyes   Place 2 drops into both eyes 3 (three) times daily as needed (itching/burning).         . diphenhydrAMINE (BENADRYL) 25 MG tablet   Oral   Take 1 tablet (25 mg total) by mouth every 6 (six) hours.   20 tablet   0   . famotidine (PEPCID) 20 MG tablet   Oral   Take 1 tablet (20 mg total) by mouth 2 (two) times daily.   14 tablet   0   . nitroGLYCERIN (NITROSTAT) 0.4 MG SL tablet   Sublingual   Place 1 tablet (0.4 mg total) under the tongue every 5 (five) minutes as needed for chest pain.   25 tablet   6   . predniSONE (DELTASONE) 10 MG tablet   Oral   Take 4 tablets (40 mg total) by mouth daily.   20 tablet   0    Triage Vitals: BP 144/64  Pulse 58  Temp(Src) 97.6 F (36.4 C)  Resp 20  Ht 5\' 6"  (1.676 m)  Wt 180 lb (81.647 kg)  BMI 29.07 kg/m2  SpO2 97%  Physical Exam  Nursing note and vitals reviewed. Constitutional: He is oriented to person, place, and time. He appears well-developed and well-nourished. No distress.  HENT:  Head: Normocephalic and atraumatic.  Mouth/Throat: Oropharynx is clear and moist.  No lip or tongue swelling. Airway patent.   Eyes: Conjunctivae and EOM are normal.  Neck: Normal range of motion. Neck supple.  Cardiovascular: Normal rate, regular rhythm and normal heart sounds.   Pulmonary/Chest: Effort  normal and breath sounds normal. No respiratory distress.  Abdominal: Soft. Bowel sounds are normal. He exhibits no distension. There is no tenderness.  Musculoskeletal: Normal range of motion. He exhibits no edema.  Neurological: He is alert and oriented to person, place, and time. No cranial nerve deficit. He exhibits normal muscle tone. Coordination normal.  Skin: Skin is warm and dry.  Psychiatric: He has a normal mood and affect. His behavior is normal.    ED Course  Procedures (including critical care time)  DIAGNOSTIC STUDIES: Oxygen Saturation is 97% on room air, normal by my interpretation.    COORDINATION OF CARE: 9:39 PM- Will order IV fluids, Benadryl, Pepcid, and Solu-Medrol to manage symptoms. Discussed treatment plan with patient at bedside and patient verbalized agreement.   Medications  0.9 %  sodium chloride infusion ( Intravenous New Bag/Given 12/26/13 2205)  sodium chloride 0.9 % bolus 250 mL ( Intravenous New Bag/Given 12/26/13 2200)  methylPREDNISolone sodium succinate (SOLU-MEDROL) 125 mg/2 mL injection 125 mg (125 mg Intravenous Given 12/26/13 2201)  diphenhydrAMINE (BENADRYL) injection 25 mg (25 mg Intravenous Given 12/26/13 2203)  famotidine (PEPCID) IVPB 20 mg (20 mg Intravenous New Bag/Given 12/26/13 2204)  EKG Interpretation   None       MDM   1. Hives   2. Allergic reaction     Patient with development of hives shortly after eating strawberries for the fruit is suspicious as the cause. No tongue swelling no lip swelling no trouble breathing. Patient treated here with IV Pepcid IV Benadryl and IV Solu-Medrol and improving significantly. Patient will be discharged on oral versions of prednisone Benadryl and Pepcid. Patient will return for any newer worse symptoms. Presentation not consistent with angioedema.   I personally performed the services described in this documentation, which was scribed in my presence. The recorded information has been reviewed  and is accurate.      Shelda Jakes, MD 12/26/13 [phone]

## 2013-12-31 ENCOUNTER — Ambulatory Visit (INDEPENDENT_AMBULATORY_CARE_PROVIDER_SITE_OTHER): Payer: Medicare Other | Admitting: Family Medicine

## 2013-12-31 ENCOUNTER — Encounter: Payer: Self-pay | Admitting: Family Medicine

## 2013-12-31 VITALS — BP 132/76 | HR 51 | Temp 98.0°F | Ht 66.0 in | Wt 179.0 lb

## 2013-12-31 DIAGNOSIS — I25810 Atherosclerosis of coronary artery bypass graft(s) without angina pectoris: Secondary | ICD-10-CM

## 2013-12-31 DIAGNOSIS — L509 Urticaria, unspecified: Secondary | ICD-10-CM

## 2013-12-31 NOTE — Progress Notes (Signed)
Patient ID: Frank [name], male   DOB: Apr 14, 1956, 58 y.o.   MRN: [id number]  Frank FentonSamuel Arie Gable, MD Phone: [phone]  Subjective:  Chief complaint-noted  # Patient here for followup from ED visit  Patient to the ED for hives after eating strawberries, he spun and well to the Benadryl, steroids, and H2 blockers given in the ED. He states that his rash and itching improved before even leaving the ED. He states his in the last day of prednisone now.   He reports good medication compliance He understands his recent findings of pulmonary nodules and followup CT He denies chest pain and increased dyspnea today. He states that he does have dyspnea with walking especially with carrying a load, but this is. baseline for him  I signed a paper verifying that he has disability for his property taxes His right side pain has improved he states it's not tolerable. The NSAID cream helped some.   ROS- Per HPI  Past Medical History Patient Active Problem List   Diagnosis Date Noted  . Hives 12/31/2013  . Rib pain on right side 12/11/2013  . Pulmonary nodules/lesions, multiple 12/11/2013  . Unstable angina 10/30/2013  . Dyslipidemia 10/27/2013  . Chronic anticoagulation 09/28/2013  . Actinic keratosis 10/31/2012  . Snoring disorder 10/31/2012  . Umbilical hernia 03/28/2012  . Dyspnea 03/20/2012  . Depression 01/02/2012  . Muscle spasm of both lower legs 01/02/2012  . Mitral regurgitation 06/14/2010  . CAD 04/07/2010  . Low HDL (under 40) 11/07/2009  . Essential hypertension, benign 11/07/2009  . Congestive heart failure 11/07/2009  . GERD 11/07/2009    Medications- reviewed and updated Current Outpatient Prescriptions  Medication Sig Dispense Refill  . diphenhydrAMINE (BENADRYL) 25 MG tablet Take 1 tablet (25 mg total) by mouth every 6 (six) hours.  20 tablet  0  . famotidine (PEPCID) 20 MG tablet Take 1 tablet (20 mg total) by mouth 2 (two) times daily.  14 tablet  0  . predniSONE  (DELTASONE) 10 MG tablet Take 4 tablets (40 mg total) by mouth daily.  20 tablet  0  . aspirin EC 81 MG tablet Take 81 mg by mouth daily.      . citalopram (CELEXA) 40 MG tablet Take 1 tablet (40 mg total) by mouth daily.  90 tablet  3  . clopidogrel (PLAVIX) 75 MG tablet Take 1 tablet (75 mg total) by mouth daily.  90 tablet  3  . Diclofenac Sodium 3 % GEL Place 1 application onto the skin 2 (two) times daily as needed. pain      . furosemide (LASIX) 20 MG tablet Take 1 tablet (20 mg total) by mouth daily.  90 tablet  3  . hydrALAZINE (APRESOLINE) 10 MG tablet Take 10 mg by mouth 2 (two) times daily.      Marland Kitchen. HYDROcodone-acetaminophen (NORCO/VICODIN) 5-325 MG per tablet Take 1 tablet by mouth every 6 (six) hours as needed for moderate pain.  20 tablet  0  . ibuprofen (ADVIL,MOTRIN) 200 MG tablet Take 600 mg by mouth every 6 (six) hours as needed. pain      . lisinopril (PRINIVIL,ZESTRIL) 2.5 MG tablet Take 1 tablet (2.5 mg total) by mouth daily.  90 tablet  3  . meloxicam (MOBIC) 15 MG tablet Take 1 tablet (15 mg total) by mouth daily.  30 tablet  0  . metoprolol tartrate (LOPRESSOR) 25 MG tablet Take 1 tablet (25 mg total) by mouth 2 (two) times daily.  180 tablet  3  .  Multiple Vitamin (MULTIVITAMIN WITH MINERALS) TABS tablet Take 1 tablet by mouth daily.      . nitroGLYCERIN (NITROSTAT) 0.4 MG SL tablet Place 1 tablet (0.4 mg total) under the tongue every 5 (five) minutes as needed for chest pain.  25 tablet  6  . pantoprazole (PROTONIX) 40 MG tablet Take 1 tablet (40 mg total) by mouth daily.  90 tablet  3  . potassium chloride (K-DUR,KLOR-CON) 10 MEQ tablet Take 1 tablet (10 mEq total) by mouth daily.  90 tablet  3  . simvastatin (ZOCOR) 40 MG tablet Take one tablet by mouth every other day.  45 tablet  3  . tetrahydrozoline-zinc (VISINE-AC) 0.05-0.25 % ophthalmic solution Place 2 drops into both eyes 3 (three) times daily as needed (itching/burning).       No current facility-administered  medications for this visit.    Objective: BP 132/76  Pulse 51  Temp(Src) 98 F (36.7 C) (Oral)  Ht 5\' 6"  (1.676 m)  Wt 179 lb (81.194 kg)  BMI 28.91 kg/m2 Gen: NAD, alert, cooperative with exam HEENT: NCAT CV: RRR, good S1/S2, 2/6 systolic murmur Resp: CTABL, no wheezes, non-labored Skin: No rash   Assessment/Plan:  CAD No chest pain, stable exertional dyspnea Continue statin, lisinopril, ASA, hydralazine, metoprolol, nitroglycerin, and Lasix Managed by cardiology  Hives Seen in the ED recently, symptoms have resolved completely Unclear as to what the inciting event was, after discussion we both doubt that strawberries cause his allergic reaction. He did previously have hives with Norco but was not taking it at that time. Okay to finish prednisone today, DC famotidine

## 2013-12-31 NOTE — Patient Instructions (Signed)
It was great to see you today!  I am glad you are doing better.   Keep an eye out for what may be causing these hives, if you have any trouble breathing with them get help right away.   Come back to see me in 3 months.

## 2013-12-31 NOTE — Assessment & Plan Note (Signed)
No chest pain, stable exertional dyspnea Continue statin, lisinopril, ASA, hydralazine, metoprolol, nitroglycerin, and Lasix Managed by cardiology

## 2013-12-31 NOTE — Assessment & Plan Note (Signed)
Seen in the ED recently, symptoms have resolved completely Unclear as to what the inciting event was, after discussion we both doubt that strawberries cause his allergic reaction. He did previously have hives with Norco but was not taking it at that time. Okay to finish prednisone today, DC famotidine

## 2014-05-03 ENCOUNTER — Other Ambulatory Visit: Payer: Self-pay | Admitting: *Deleted

## 2014-05-03 DIAGNOSIS — I2510 Atherosclerotic heart disease of native coronary artery without angina pectoris: Secondary | ICD-10-CM

## 2014-05-04 MED ORDER — CLOPIDOGREL BISULFATE 75 MG PO TABS: 75.0000 mg | ORAL_TABLET | Freq: Every day | ORAL | Status: DC

## 2014-05-26 ENCOUNTER — Encounter: Payer: Self-pay | Admitting: Cardiovascular Disease

## 2014-05-26 ENCOUNTER — Ambulatory Visit (INDEPENDENT_AMBULATORY_CARE_PROVIDER_SITE_OTHER): Payer: Medicare Other | Admitting: Cardiovascular Disease

## 2014-05-26 VITALS — BP 110/68 | HR 60 | Ht 67.0 in | Wt 176.0 lb

## 2014-05-26 DIAGNOSIS — I059 Rheumatic mitral valve disease, unspecified: Secondary | ICD-10-CM

## 2014-05-26 DIAGNOSIS — I340 Nonrheumatic mitral (valve) insufficiency: Secondary | ICD-10-CM

## 2014-05-26 DIAGNOSIS — E785 Hyperlipidemia, unspecified: Secondary | ICD-10-CM

## 2014-05-26 DIAGNOSIS — I25810 Atherosclerosis of coronary artery bypass graft(s) without angina pectoris: Secondary | ICD-10-CM

## 2014-05-26 DIAGNOSIS — I10 Essential (primary) hypertension: Secondary | ICD-10-CM

## 2014-05-26 NOTE — Patient Instructions (Signed)

## 2014-05-26 NOTE — Progress Notes (Signed)
History of Present Illness: 58 yo male with a history of CAD s/p CABG 2004 and stenting in 2011, HLD, HTN, GERD and mitral regurgitation who is here today for cardiac follow up. He had been followed by [facility] but was non-compliant with f/u appointments and I met him in May 2013 for the first time. Echo 01/31/12 with normal LV size and function with at least moderate MR. He told me at the first visit that he felt fatigued and had SOB. He had no LE edema, orthopnea or chest pain. I arranged a TEE on 03/27/12 to evaluate his MR and this showed mild to moderate MR. At his f/u visit 05/06/12, he had c/o exertional chest pressure, dyspnea and fatigue. I arranged a cardiac cath on 05/20/12. There was severe disease in the SVG to the PDA and 2 DES were placed in the SVG to the PDA. The LIMA was patent but the vein grafts to the OM, Diagonal and PLA were occluded. He had recurrence of angina December 2014. Repeat cardiac cath 10/30/13 with patent LIMA to LAD, patent SVG to PDA with severe disease. Unchanged disease in native vessels as below. 2 drug eluting stents were placed in the SVG to the PDA.   He is here today for follow up. He feels great. No chest pain or SOB.   Primary Care Physician: [facility]  Last Lipid Profile:Lipid Panel     Component Value Date/Time   CHOL 160 07/01/2013 0859   TRIG 178* 07/01/2013 0859   HDL 38* 07/01/2013 0859   CHOLHDL 4.2 07/01/2013 0859   VLDL 36 07/01/2013 0859   LDLCALC 86 07/01/2013 0859    Past Medical History  Diagnosis Date  . Mitral regurgitation     Moderate by TEE 03/27/12  . Low HDL (under 40)   . GERD (gastroesophageal reflux disease)   . Hypertension   . CAD (coronary artery disease)     5V CABG in 2004; stent to RCA x1 and SVG to acute marginal x2 2011;  PTCA/DES x 2 body of SVG to PDA  05/20/12  . CHF (congestive heart failure)      EF 55% by echo 03/27/2012  . Depression   . Numbness and tingling in hands   . Numbness and  tingling of both legs   . Umbilical hernia     unrepaired (05/20/12)  . Arthritis     hands, shoulders, arms  . Chronic lower back pain     "w/activity"  . Anxiety   . HLD (hyperlipidemia)   . Myocardial infarction 2004  . Shortness of breath     ' WHEN MY HEART ACTS UP "    Past Surgical History  Procedure Laterality Date  . Tee without cardioversion  03/27/2012    Procedure: TRANSESOPHAGEAL ECHOCARDIOGRAM (TEE);  Surgeon: Larey Dresser, MD;  Location: [facility] ENDOSCOPY;  Service: Cardiovascular;  Laterality: N/A;  to be done at 1100  . Coronary artery bypass graft  2004    5v CABG   . Incision and drainage of wound  2010    "from bite; maybe snake or spider; almost lost LLE"  . Ptca  10/30/2013    DES  SVG    . Coronary angioplasty with stent placement  05/11    stent to RCA x 1 and SVG-acute marginal x2. EF normal  . Coronary angioplasty with stent placement  05/20/12    PTCA/DES x 2 body of SVG to PDA  Current Outpatient Prescriptions  Medication Sig Dispense Refill  . aspirin EC 81 MG tablet Take 81 mg by mouth daily.      . citalopram (CELEXA) 40 MG tablet Take 1 tablet (40 mg total) by mouth daily.  90 tablet  3  . clopidogrel (PLAVIX) 75 MG tablet Take 1 tablet (75 mg total) by mouth daily.  90 tablet  3  . furosemide (LASIX) 20 MG tablet Take 1 tablet (20 mg total) by mouth daily.  90 tablet  3  . hydrALAZINE (APRESOLINE) 10 MG tablet Take 10 mg by mouth 2 (two) times daily.      Marland Kitchen HYDROcodone-acetaminophen (NORCO/VICODIN) 5-325 MG per tablet Take 1 tablet by mouth every 6 (six) hours as needed for moderate pain.  20 tablet  0  . ibuprofen (ADVIL,MOTRIN) 200 MG tablet Take 600 mg by mouth every 6 (six) hours as needed. pain      . lisinopril (PRINIVIL,ZESTRIL) 2.5 MG tablet Take 1 tablet (2.5 mg total) by mouth daily.  90 tablet  3  . metoprolol tartrate (LOPRESSOR) 25 MG tablet Take 1 tablet (25 mg total) by mouth 2 (two) times daily.  180 tablet  3  . Multiple Vitamin  (MULTIVITAMIN WITH MINERALS) TABS tablet Take 1 tablet by mouth daily.      . pantoprazole (PROTONIX) 40 MG tablet Take 1 tablet (40 mg total) by mouth daily.  90 tablet  3  . potassium chloride (K-DUR,KLOR-CON) 10 MEQ tablet Take 1 tablet (10 mEq total) by mouth daily.  90 tablet  3  . simvastatin (ZOCOR) 40 MG tablet Take one tablet by mouth every other day.  45 tablet  3  . tetrahydrozoline-zinc (VISINE-AC) 0.05-0.25 % ophthalmic solution Place 2 drops into both eyes 3 (three) times daily as needed (itching/burning).      . nitroGLYCERIN (NITROSTAT) 0.4 MG SL tablet Place 1 tablet (0.4 mg total) under the tongue every 5 (five) minutes as needed for chest pain.  25 tablet  6   No current facility-administered medications for this visit.    Allergies  Allergen Reactions  . Sulfa Antibiotics Rash  . Hydrocodone Hives and Itching    History   Social History  . Marital Status: Legally Separated    Spouse Name: N/A    Number of Children: 3  . Years of Education: N/A   Occupational History  . 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 Smoking status: Former Smoker -- 3.00 packs/day for 40 years    Types: Cigarettes    Quit date: 01/23/2003  . Smokeless tobacco: Never Used  . 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 Drug Use: No     Comment: 05/20/12 "last marijuana was 2004"  . Sexual Activity: Yes   Other Topics Concern  . Not on file   Social History Narrative  . No narrative on file    Family History  Problem Relation Age of Onset  . Heart disease Mother   . Cancer Mother     ? type  . Diabetes Father   . Hyperlipidemia Father   . Hypertension Father   . Stomach cancer Father   . Heart disease Brother 44    X 5 brothers    Review of Systems:  As stated in the HPI and otherwise negative.   BP 110/68  Pulse 60  Ht 5' 7"  (1.702 m)  Wt  176 lb (79.833 kg)  BMI 27.56 kg/m2  Physical  Examination: General: Well developed, well nourished, NAD HEENT: OP clear, mucus membranes moist SKIN: warm, dry. No rashes. Neuro: No focal deficits Musculoskeletal: Muscle strength 5/5 all ext Psychiatric: Mood and affect normal Neck: No JVD, no carotid bruits, no thyromegaly, no lymphadenopathy. Lungs:Clear bilaterally, no wheezes, rhonci, crackles Cardiovascular: Regular rate and rhythm. No murmurs, gallops or rubs. Abdomen:Soft. Bowel sounds present. Non-tender.  Extremities: No lower extremity edema. Pulses are 2 + in the bilateral DP/PT.  Cardiac cath 10/30/13: Left main: No obstructive disease noted.  Left Anterior Descending Artery: Moderate sized vessel that courses to the apex. There is diffuse proximal 80% stenosis and 100% mid occlusion. The mid and distal vessel fills from the patent LIMA graft.  Circumflex Artery: Moderate sized vessel with mild plaque. The very small caliber OM branch has 80% ostial stenosis. The Ramus intermediate branch is moderate in caliber with proximal 50% stenosis which appears unchanged from last cath.  Right Coronary Artery: Small to moderate caliber vessel with diffuse plaque. There is a patent stent mid vessel with 40-50% in-stent restenosis, unchanged from last cath. The PDA is occluded and fills from the graft. The posterolateral segment fills antegrade. There appears to be a 99% stenosis leading into the small PL segment at the site of the old graft anastamosis at the suture line. This vessel is small in caliber, 1.75 mm in diameter.  Graft Anatomy:  SVG to PDA is patent with stents present in the proximal/mid and distal body of vein graft. There is 70-80% stenosis in the ostium of the vein graft extending down into the stented segment. The mid stented segment has diffuse 60-70% restenosis. The distal stented segment has 90% restenosis. Just beyond the distal stent there is a 60-70% stenosis. The SVG limb to the PLA is known to be occluded.  SVG to  OM is known to be occluded and was not selectively engaged.  SVG to Diagonal is known to be occluded and was not selectively engaged.  LIMA to LAD was patent.  Left Ventricular Angiogram: LVEF 55% with moderate MR  Assessment and Plan:   1. CAD: Will need lifelong ASA and Plavix. Continue other cardiac meds.   2. HTN: BP well controlled. No changes.   3. Hyperlipidemia:  Lipids well controlled. He takes Zocor every other day.   4. Mitral regurgitation: Moderate by TEE 2013 and by LV gram 12/14. Will repeat echo this fall.

## 2014-06-21 ENCOUNTER — Other Ambulatory Visit: Payer: Self-pay | Admitting: Family Medicine

## 2014-06-28 ENCOUNTER — Ambulatory Visit (HOSPITAL_COMMUNITY): Payer: Medicare Other | Attending: Cardiology | Admitting: Cardiology

## 2014-06-28 DIAGNOSIS — I2510 Atherosclerotic heart disease of native coronary artery without angina pectoris: Secondary | ICD-10-CM | POA: Insufficient documentation

## 2014-06-28 DIAGNOSIS — I340 Nonrheumatic mitral (valve) insufficiency: Secondary | ICD-10-CM

## 2014-06-28 DIAGNOSIS — I059 Rheumatic mitral valve disease, unspecified: Secondary | ICD-10-CM

## 2014-06-28 DIAGNOSIS — I25810 Atherosclerosis of coronary artery bypass graft(s) without angina pectoris: Secondary | ICD-10-CM

## 2014-06-28 NOTE — Progress Notes (Signed)
Echo performed. 

## 2014-06-29 ENCOUNTER — Telehealth: Payer: Self-pay | Admitting: Family Medicine

## 2014-06-29 MED ORDER — SIMVASTATIN 40 MG PO TABS: ORAL_TABLET | ORAL | Status: DC

## 2014-06-29 NOTE — Telephone Encounter (Signed)
Pt called and wanted the doctor to know that he needs a refill on his Zocor and that his pharmacy faxed us on 8/10. jw

## 2014-06-29 NOTE — Telephone Encounter (Signed)
Pt informed of zocor being refilled. Khyli Swaim CMA

## 2014-06-29 NOTE — Telephone Encounter (Signed)
Refilled zocor, will ask team to inform.   Murtis SinkSam Bradshaw, MD [facility] Resident, PGY-3 06/29/2014, 1:56 PM

## 2014-06-30 ENCOUNTER — Telehealth: Payer: Self-pay | Admitting: Family Medicine

## 2014-06-30 NOTE — Telephone Encounter (Signed)
Pt called again about refill.  Please fax RX to pharmacy and call pt to let him know this has been done

## 2014-06-30 NOTE — Telephone Encounter (Signed)
Pt went to pick up his prescription of Simvastatin from his pharmacy and is was not there. Can we call or re-fax this today since he will not have any for tonight. jw

## 2014-06-30 NOTE — Telephone Encounter (Signed)
Called pt RX in to pharmacy. Pt informed as well. Jaslin Novitski CMA

## 2014-07-08 ENCOUNTER — Ambulatory Visit (INDEPENDENT_AMBULATORY_CARE_PROVIDER_SITE_OTHER): Payer: Medicare Other | Admitting: Family Medicine

## 2014-07-08 ENCOUNTER — Telehealth: Payer: Self-pay | Admitting: *Deleted

## 2014-07-08 ENCOUNTER — Encounter: Payer: Self-pay | Admitting: Family Medicine

## 2014-07-08 VITALS — BP 116/75 | HR 53 | Temp 97.9°F | Ht 67.0 in | Wt 177.0 lb

## 2014-07-08 DIAGNOSIS — R918 Other nonspecific abnormal finding of lung field: Secondary | ICD-10-CM

## 2014-07-08 DIAGNOSIS — H6991 Unspecified Eustachian tube disorder, right ear: Secondary | ICD-10-CM

## 2014-07-08 DIAGNOSIS — I209 Angina pectoris, unspecified: Secondary | ICD-10-CM

## 2014-07-08 DIAGNOSIS — H6990 Unspecified Eustachian tube disorder, unspecified ear: Secondary | ICD-10-CM | POA: Insufficient documentation

## 2014-07-08 DIAGNOSIS — I25708 Atherosclerosis of coronary artery bypass graft(s), unspecified, with other forms of angina pectoris: Secondary | ICD-10-CM

## 2014-07-08 DIAGNOSIS — I25810 Atherosclerosis of coronary artery bypass graft(s) without angina pectoris: Secondary | ICD-10-CM

## 2014-07-08 DIAGNOSIS — B351 Tinea unguium: Secondary | ICD-10-CM

## 2014-07-08 DIAGNOSIS — R059 Cough, unspecified: Secondary | ICD-10-CM | POA: Insufficient documentation

## 2014-07-08 DIAGNOSIS — R05 Cough: Secondary | ICD-10-CM

## 2014-07-08 LAB — CBC WITH DIFFERENTIAL/PLATELET
BASOS ABS: 0.1 10*3/uL (ref 0.0–0.1)
Basophils Relative: 1 % (ref 0–1)
EOS PCT: 7 % — AB (ref 0–5)
Eosinophils Absolute: 0.5 10*3/uL (ref 0.0–0.7)
HEMATOCRIT: 41.6 % (ref 39.0–52.0)
Hemoglobin: 14.2 g/dL (ref 13.0–17.0)
LYMPHS PCT: 25 % (ref 12–46)
Lymphs Abs: 1.9 10*3/uL (ref 0.7–4.0)
MCH: 29.9 pg (ref 26.0–34.0)
MCHC: 34.1 g/dL (ref 30.0–36.0)
MCV: 87.6 fL (ref 78.0–100.0)
MONO ABS: 0.9 10*3/uL (ref 0.1–1.0)
Monocytes Relative: 12 % (ref 3–12)
Neutro Abs: 4.1 10*3/uL (ref 1.7–7.7)
Neutrophils Relative %: 55 % (ref 43–77)
Platelets: 206 10*3/uL (ref 150–400)
RBC: 4.75 MIL/uL (ref 4.22–5.81)
RDW: 13.7 % (ref 11.5–15.5)
WBC: 7.4 10*3/uL (ref 4.0–10.5)

## 2014-07-08 LAB — LIPID PANEL
Cholesterol: 140 mg/dL (ref 0–200)
HDL: 36 mg/dL — AB (ref >39)
LDL Cholesterol: 67 mg/dL (ref 0–99)
TRIGLYCERIDES: 184 mg/dL — AB (ref <150)
Total CHOL/HDL Ratio: 3.9 Ratio
VLDL: 37 mg/dL (ref 0–40)

## 2014-07-08 LAB — COMPREHENSIVE METABOLIC PANEL
ALBUMIN: 4.5 g/dL (ref 3.5–5.2)
ALT: 13 U/L (ref 0–53)
AST: 18 U/L (ref 0–37)
Alkaline Phosphatase: 95 U/L (ref 39–117)
BUN: 15 mg/dL (ref 6–23)
CHLORIDE: 103 meq/L (ref 96–112)
CO2: 24 mEq/L (ref 19–32)
Calcium: 8.7 mg/dL (ref 8.4–10.5)
Creat: 1.15 mg/dL (ref 0.50–1.35)
GLUCOSE: 103 mg/dL — AB (ref 70–99)
Potassium: 4.4 mEq/L (ref 3.5–5.3)
Sodium: 138 mEq/L (ref 135–145)
Total Bilirubin: 0.5 mg/dL (ref 0.2–1.2)
Total Protein: 7 g/dL (ref 6.0–8.3)

## 2014-07-08 MED ORDER — FUROSEMIDE 20 MG PO TABS: 20.0000 mg | ORAL_TABLET | Freq: Every day | ORAL | Status: DC

## 2014-07-08 MED ORDER — TERBINAFINE HCL 250 MG PO TABS: 250.0000 mg | ORAL_TABLET | Freq: Every day | ORAL | Status: DC

## 2014-07-08 MED ORDER — MOMETASONE FUROATE 50 MCG/ACT NA SUSP: 2.0000 | Freq: Every day | NASAL | Status: DC

## 2014-07-08 MED ORDER — LISINOPRIL 2.5 MG PO TABS: 2.5000 mg | ORAL_TABLET | Freq: Every day | ORAL | Status: DC

## 2014-07-08 MED ORDER — METOPROLOL TARTRATE 25 MG PO TABS: 25.0000 mg | ORAL_TABLET | Freq: Two times a day (BID) | ORAL | Status: DC

## 2014-07-08 MED ORDER — CETIRIZINE HCL 10 MG PO TABS: 10.0000 mg | ORAL_TABLET | Freq: Every day | ORAL | Status: DC

## 2014-07-08 MED ORDER — SIMVASTATIN 40 MG PO TABS: ORAL_TABLET | ORAL | Status: DC

## 2014-07-08 NOTE — Progress Notes (Signed)
Patient ID: Frank [name], male   DOB: 12-08-1955, 58 y.o.   MRN: [id number]  Kevin Fenton, MD Phone: [phone]  Subjective:  Chief complaint-noted  Pt Here for followup of CAD, funny sound in ears, and toe fungus  Rash on feet Patient states that he's had this rash intermittently with cracks on his heels for 5-6 years. He also has intermittent yellowing and thickening of his left great toe. He notes that he has multiple cracks which opened up without much bleeding intermittently. He has lots of foot itching and irritation in the wintertime. Denies any current lesions that are bleeding or draining, or lesions concerning for infection.  Funny sound in ears For the last 2-3 months notes that he has had a productive cough, and intermittent sandpapery sound in his right ear. He denies fever, ear pain, or loss of appetite. He states that he has bad allergies but does not take any medication for it out of concern for erythematous heart disease.  Lung nodule Would like to followup with his CT, more productive more recent cough lately.  Smoking status noted  ROS-  Per history of present illness  Past Medical History Patient Active Problem List   Diagnosis Date Noted  . Tinea unguium 07/08/2014  . Eustachian tube disorder 07/08/2014  . Hives 12/31/2013  . Rib pain on right side 12/11/2013  . Pulmonary nodules/lesions, multiple 12/11/2013  . Unstable angina 10/30/2013  . Dyslipidemia 10/27/2013  . Chronic anticoagulation 09/28/2013  . Actinic keratosis 10/31/2012  . Snoring disorder 10/31/2012  . Umbilical hernia 03/28/2012  . Dyspnea 03/20/2012  . Depression 01/02/2012  . Muscle spasm of both lower legs 01/02/2012  . Mitral regurgitation 06/14/2010  . CAD 04/07/2010  . Low HDL (under 40) 11/07/2009  . Essential hypertension, benign 11/07/2009  . Congestive heart failure 11/07/2009  . GERD 11/07/2009    Medications- reviewed and updated Current Outpatient  Prescriptions  Medication Sig Dispense Refill  . aspirin EC 81 MG tablet Take 81 mg by mouth daily.      . citalopram (CELEXA) 40 MG tablet Take 1 tablet (40 mg total) by mouth daily.  90 tablet  3  . clopidogrel (PLAVIX) 75 MG tablet Take 1 tablet (75 mg total) by mouth daily.  90 tablet  3  . furosemide (LASIX) 20 MG tablet Take 1 tablet (20 mg total) by mouth daily.  90 tablet  3  . hydrALAZINE (APRESOLINE) 10 MG tablet Take 1 tablet (10 mg total) by mouth 3 (three) times daily.  90 tablet  11  . lisinopril (PRINIVIL,ZESTRIL) 2.5 MG tablet Take 1 tablet (2.5 mg total) by mouth daily.  90 tablet  3  . metoprolol tartrate (LOPRESSOR) 25 MG tablet Take 1 tablet (25 mg total) by mouth 2 (two) times daily.  180 tablet  3  . Multiple Vitamin (MULTIVITAMIN WITH MINERALS) TABS tablet Take 1 tablet by mouth daily.      . pantoprazole (PROTONIX) 40 MG tablet Take 1 tablet (40 mg total) by mouth daily.  90 tablet  3  . potassium chloride (K-DUR,KLOR-CON) 10 MEQ tablet Take 1 tablet (10 mEq total) by mouth daily.  90 tablet  3  . simvastatin (ZOCOR) 40 MG tablet Take one tablet by mouth every other day.  45 tablet  11  . HYDROcodone-acetaminophen (NORCO/VICODIN) 5-325 MG per tablet Take 1 tablet by mouth every 6 (six) hours as needed for moderate pain.  20 tablet  0  . ibuprofen (ADVIL,MOTRIN) 200 MG tablet Take  600 mg by mouth every 6 (six) hours as needed. pain      . nitroGLYCERIN (NITROSTAT) 0.4 MG SL tablet Place 1 tablet (0.4 mg total) under the tongue every 5 (five) minutes as needed for chest pain.  25 tablet  6  . terbinafine (LAMISIL) 250 MG tablet Take 1 tablet (250 mg total) by mouth daily.  30 tablet  2  . tetrahydrozoline-zinc (VISINE-AC) 0.05-0.25 % ophthalmic solution Place 2 drops into both eyes 3 (three) times daily as needed (itching/burning).       No current facility-administered medications for this visit.    Objective: BP 116/75  Pulse 53  Temp(Src) 97.9 F (36.6 C) (Oral)   Ht 5\' 7"  (1.702 m)  Wt 177 lb (80.287 kg)  BMI 27.72 kg/m2 Gen: NAD, alert, cooperative with exam HEENT: NCAT, TMs normal bilaterally, turbinates swollen and boggy bilaterally CV: RRR, good S1/S2, no murmur Resp: CTABL, no wheezes, non-labored Ext: No edema, warm Neuro: Alert and oriented, No gross deficits   Assessment/Plan:  Eustachian tube disorder For several months now Start Claritin x1 month and then add Flonase versus neti pot if no improvement   CAD Recently seen by cardiology, status post CABG and PCI with stent Stable angina Labs today  Tinea unguium Symptoms for several years Treat with Lamisil 250 mg x6-12 weeks Followup 6 weeks Check liver labs today.  Cough Productive intermittent cough for the last 2-3 months Worse in the morning and at night Considering previous lung nodules Will repeat CT scan Most likely postnasal drip, also with eustachian tube dysfunction. Recommend Claritin, then add Flonase or neti pot     Orders Placed This Encounter  Procedures  . Comprehensive metabolic panel  . Lipid Panel  . CBC with Differential    Meds ordered this encounter  Medications  . simvastatin (ZOCOR) 40 MG tablet    Sig: Take one tablet by mouth every other day.    Dispense:  45 tablet    Refill:  11  . furosemide (LASIX) 20 MG tablet    Sig: Take 1 tablet (20 mg total) by mouth daily.    Dispense:  90 tablet    Refill:  3  . lisinopril (PRINIVIL,ZESTRIL) 2.5 MG tablet    Sig: Take 1 tablet (2.5 mg total) by mouth daily.    Dispense:  90 tablet    Refill:  3  . metoprolol tartrate (LOPRESSOR) 25 MG tablet    Sig: Take 1 tablet (25 mg total) by mouth 2 (two) times daily.    Dispense:  180 tablet    Refill:  3    CYCLE FILL MEDICATION. Authorization is required for next refill.  . terbinafine (LAMISIL) 250 MG tablet    Sig: Take 1 tablet (250 mg total) by mouth daily.    Dispense:  30 tablet    Refill:  2  . cetirizine (ZYRTEC) 10 MG tablet     Sig: Take 1 tablet (10 mg total) by mouth daily.    Dispense:  30 tablet    Refill:  11  . mometasone (NASONEX) 50 MCG/ACT nasal spray    Sig: Place 2 sprays into the nose daily.    Dispense:  17 g    Refill:  12

## 2014-07-08 NOTE — Assessment & Plan Note (Signed)
For several months now Start Claritin x1 month and then add Flonase versus neti pot if no improvement

## 2014-07-08 NOTE — Telephone Encounter (Signed)
Pt wife informed of appt for CT scan. Monday august 24th @ 10am. Arrive at 9:45am. NPO 4 hrs. Elba Dendinger CMA

## 2014-07-08 NOTE — Assessment & Plan Note (Signed)
Productive intermittent cough for the last 2-3 months Worse in the morning and at night Considering previous lung nodules Will repeat CT scan Most likely postnasal drip, also with eustachian tube dysfunction. Recommend Claritin, then add Flonase or neti pot

## 2014-07-08 NOTE — Assessment & Plan Note (Signed)
Symptoms for several years Treat with Lamisil 250 mg x6-12 weeks Followup 6 weeks Check liver labs today.

## 2014-07-08 NOTE — Assessment & Plan Note (Addendum)
Recently seen by cardiology, status post CABG and PCI with stent Stable angina Labs today

## 2014-07-08 NOTE — Patient Instructions (Signed)
Great to see you today!  I'd like to follow up with you in 6 weeks for labs and to see how you're doing.   The toe fungus medication needs to be taken daily for at least 6 weeks and usually up to 12 weeks.

## 2014-07-09 ENCOUNTER — Encounter: Payer: Self-pay | Admitting: Family Medicine

## 2014-07-12 ENCOUNTER — Ambulatory Visit (HOSPITAL_COMMUNITY)
Admission: RE | Admit: 2014-07-12 | Discharge: 2014-07-12 | Disposition: A | Discharge status: Home / Self Care | Payer: Medicare Other | Source: Ambulatory Visit | Attending: Family Medicine | Admitting: Family Medicine

## 2014-07-12 ENCOUNTER — Encounter: Payer: Self-pay | Admitting: Family Medicine

## 2014-07-12 DIAGNOSIS — R059 Cough, unspecified: Secondary | ICD-10-CM | POA: Insufficient documentation

## 2014-07-12 DIAGNOSIS — R05 Cough: Secondary | ICD-10-CM | POA: Insufficient documentation

## 2014-07-12 DIAGNOSIS — R918 Other nonspecific abnormal finding of lung field: Secondary | ICD-10-CM | POA: Insufficient documentation

## 2014-07-12 IMAGING — CT CT CHEST W/ CM
2 of 4 series · 15 of 36 positions shown, 18 images · IV contrast (APPLIED)
Comparison: [DATE]

CLINICAL DATA: Followup nodules. Cough. Previous CHRISTIANA.

EXAM:
CT CHEST WITH CONTRAST
TECHNIQUE: Multidetector CT imaging of the chest was performed during
intravenous contrast administration.
CONTRAST:  80mL OMNIPAQUE IOHEXOL 300 MG/ML  SOLN

[Series 2: thorax 5.0 i31f 1 · axial · 0.78mm/px · z∈[-356,-76]mm · 12 of 66 slices shown, 15 images]
[im 5/66  mediastinal]
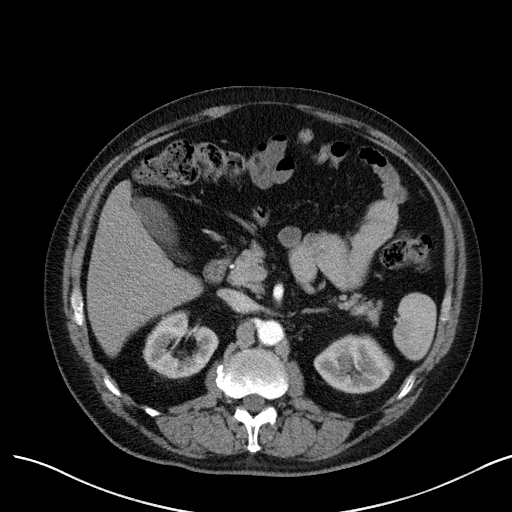
[im 5/66  lung]
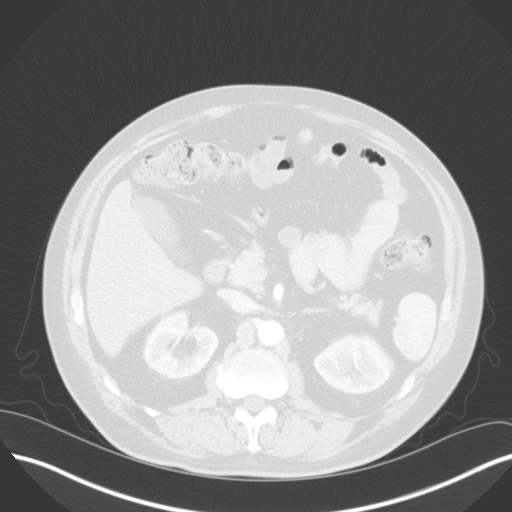
[im 10/66  lung]
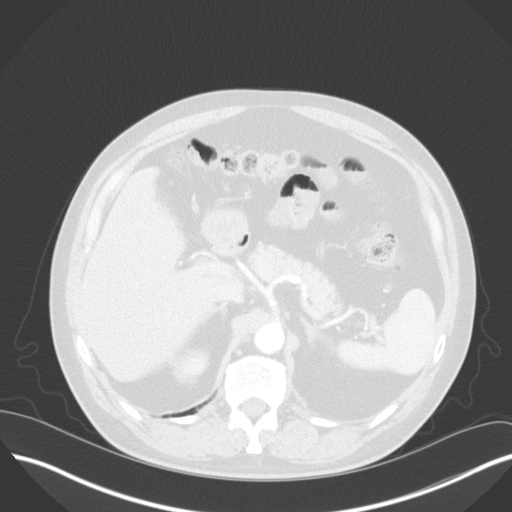
[im 14/66  lung]
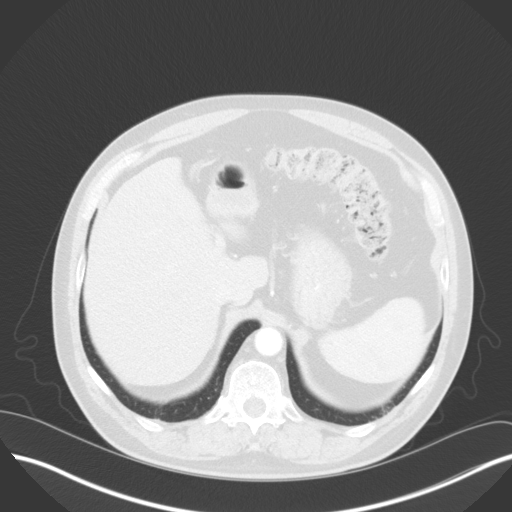
[im 19/66  lung]
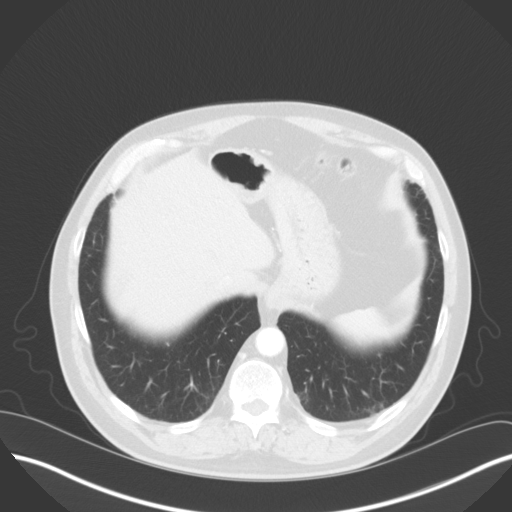
[im 24/66  mediastinal]
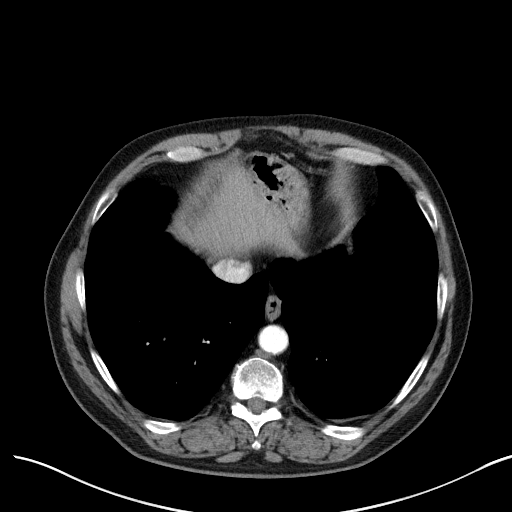
[im 24/66  lung]
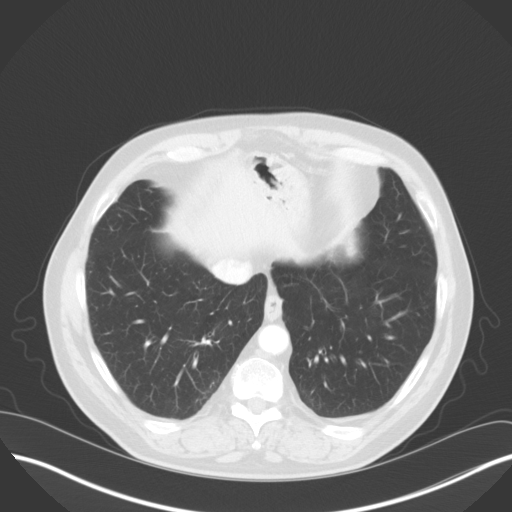
[im 28/66  lung]
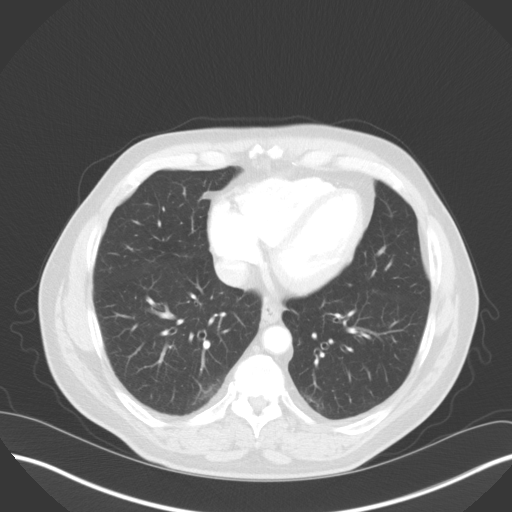
[im 38/66  lung]
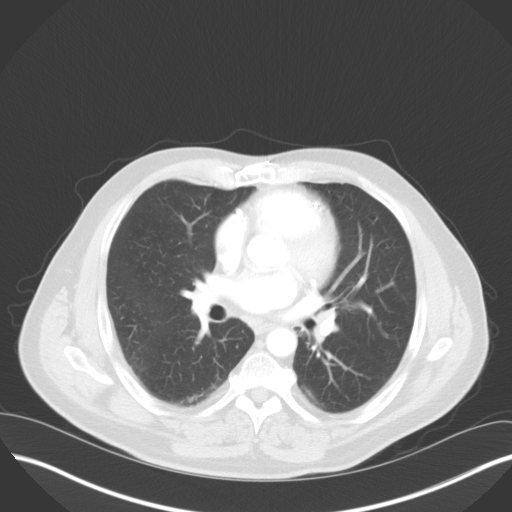
[im 42/66  lung]
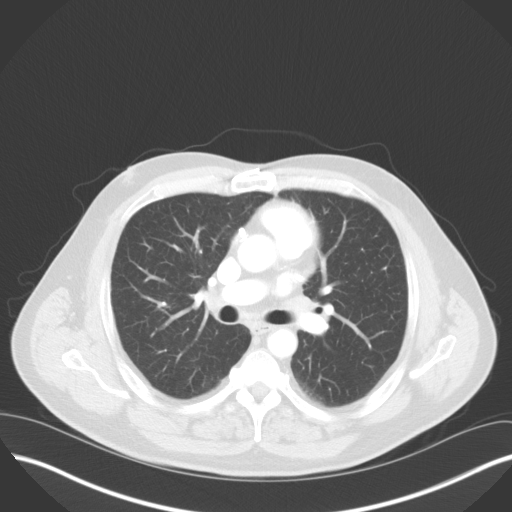
[im 47/66  mediastinal]
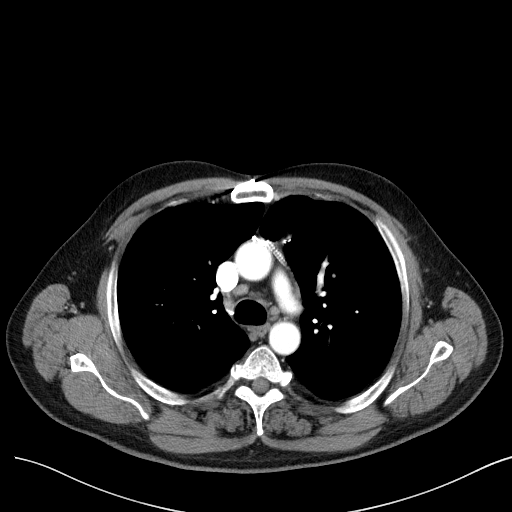
[im 47/66  lung]
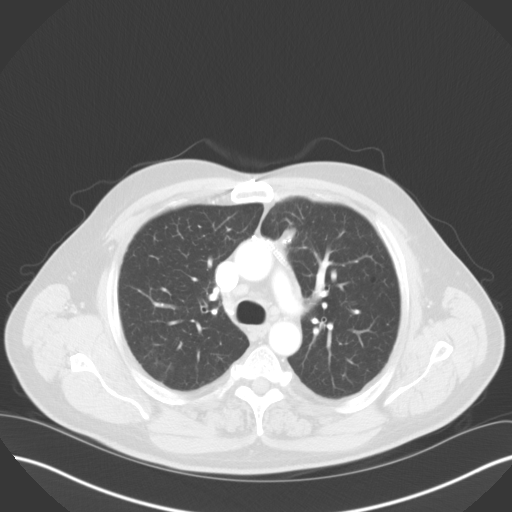
[im 52/66  lung]
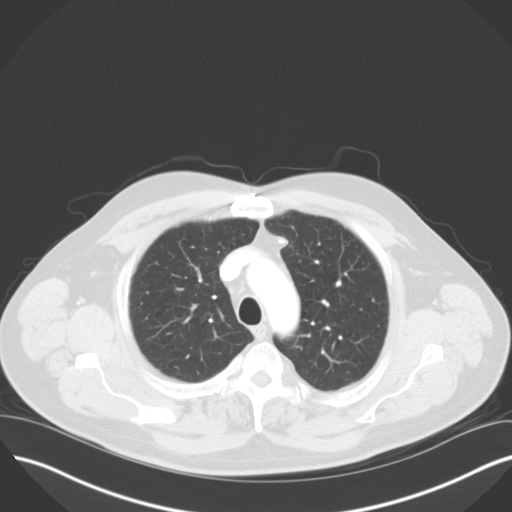
[im 56/66  lung]
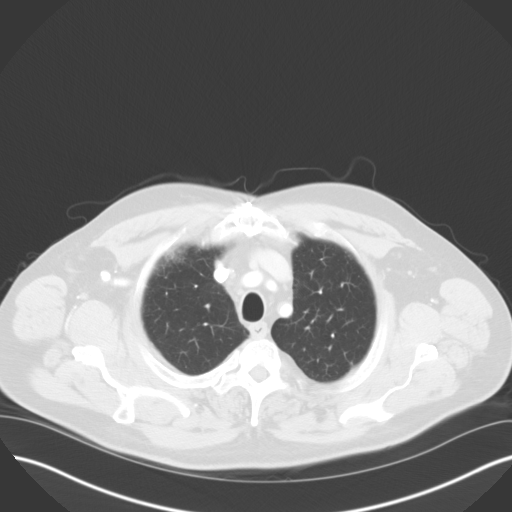
[im 61/66  lung]
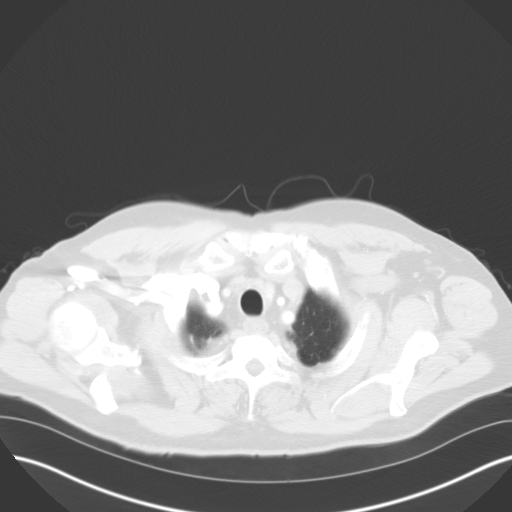

[Series 5: coronal · coronal · 0.70mm/px · 3 of 90 slices shown]
[im 18/90  lung]
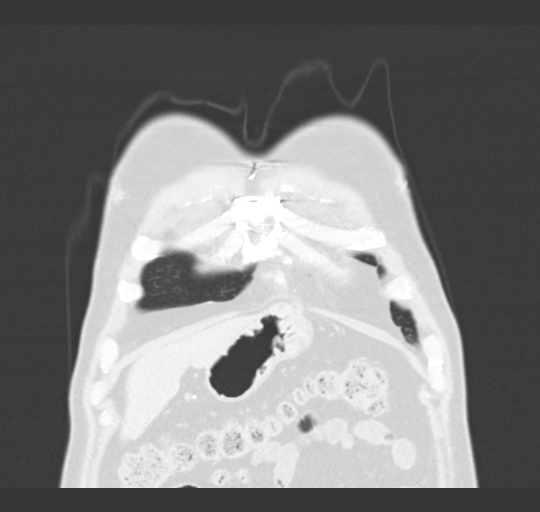
[im 36/90  lung]
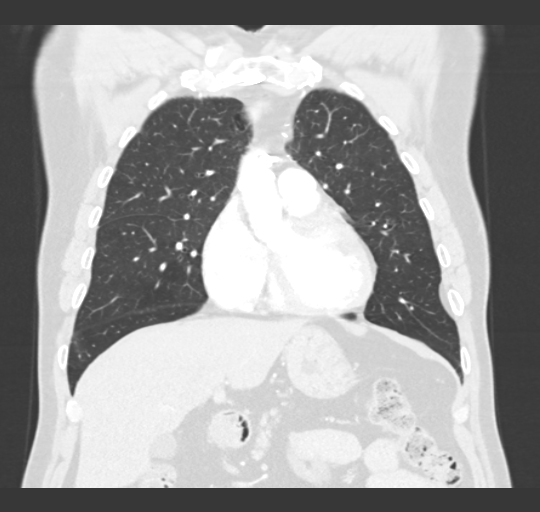
[im 54/90  lung]
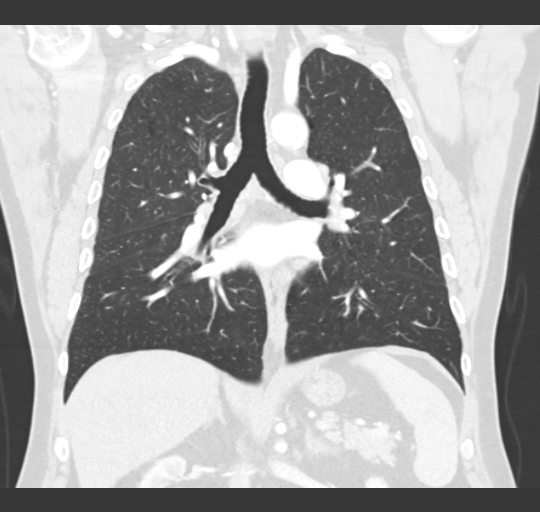

[15 of 36 positions shown; findings below may reference images not displayed]

FINDINGS: Median sternotomy wires are present with atherosclerotic coronary
artery disease as well as evidence of previous coronary artery
bypass surgery.

Lungs are adequately inflated without focal consolidation or
effusion. There is minimal centrilobular emphysematous disease. The
previously noted patchy interstitial process with nodularity has
nearly completely resolved within the right lung. There is a 3-4 mm
nodule opacity along the right major fissure. There is subtle patchy
reticulonodular density over the periphery of the left lower lobe
most notable over the superior segment which is new. There is been
resolution of other previously seen areas of peripheral reticular
nodular density in the left lung. These findings suggest interval
improvement of an inflammatory or chronic atypical infectious
process.

Heart is normal size. No significant mediastinal, hilar or axillary
adenopathy.

Images through the upper abdomen are unchanged. Remaining bones soft
tissues are unchanged.
IMPRESSION: Moderate interval improvement in the previously noted interstitial
process with nodularity as described suggesting an improved
inflammatory or atypical chronic infectious process. Mild
emphysematous disease. Recommend follow-up noncontrast chest CT 1
year.

## 2014-07-12 MED ORDER — IOHEXOL 300 MG/ML  SOLN
80.0000 mL | Freq: Once | INTRAMUSCULAR | Status: AC | PRN
  Administered 2014-07-12: 80 mL via INTRAVENOUS

## 2014-09-15 ENCOUNTER — Encounter: Payer: Self-pay | Admitting: Home Health Services

## 2014-10-24 ENCOUNTER — Emergency Department (HOSPITAL_COMMUNITY)
Admission: EM | Admit: 2014-10-24 | Discharge: 2014-10-24 | Disposition: A | Discharge status: Home / Self Care | Payer: Medicare Other | Attending: Emergency Medicine | Admitting: Emergency Medicine

## 2014-10-24 ENCOUNTER — Encounter (HOSPITAL_COMMUNITY): Payer: Self-pay | Admitting: Emergency Medicine

## 2014-10-24 DIAGNOSIS — R079 Chest pain, unspecified: Secondary | ICD-10-CM | POA: Diagnosis not present

## 2014-10-24 DIAGNOSIS — F419 Anxiety disorder, unspecified: Secondary | ICD-10-CM | POA: Insufficient documentation

## 2014-10-24 DIAGNOSIS — M79674 Pain in right toe(s): Secondary | ICD-10-CM | POA: Diagnosis present

## 2014-10-24 DIAGNOSIS — E785 Hyperlipidemia, unspecified: Secondary | ICD-10-CM | POA: Diagnosis not present

## 2014-10-24 DIAGNOSIS — I2510 Atherosclerotic heart disease of native coronary artery without angina pectoris: Secondary | ICD-10-CM | POA: Insufficient documentation

## 2014-10-24 DIAGNOSIS — G8929 Other chronic pain: Secondary | ICD-10-CM | POA: Insufficient documentation

## 2014-10-24 DIAGNOSIS — Z87891 Personal history of nicotine dependence: Secondary | ICD-10-CM | POA: Insufficient documentation

## 2014-10-24 DIAGNOSIS — K219 Gastro-esophageal reflux disease without esophagitis: Secondary | ICD-10-CM | POA: Diagnosis not present

## 2014-10-24 DIAGNOSIS — Z7902 Long term (current) use of antithrombotics/antiplatelets: Secondary | ICD-10-CM | POA: Insufficient documentation

## 2014-10-24 DIAGNOSIS — M13871 Other specified arthritis, right ankle and foot: Secondary | ICD-10-CM | POA: Diagnosis not present

## 2014-10-24 DIAGNOSIS — I509 Heart failure, unspecified: Secondary | ICD-10-CM | POA: Diagnosis not present

## 2014-10-24 DIAGNOSIS — I10 Essential (primary) hypertension: Secondary | ICD-10-CM | POA: Insufficient documentation

## 2014-10-24 DIAGNOSIS — F329 Major depressive disorder, single episode, unspecified: Secondary | ICD-10-CM | POA: Diagnosis not present

## 2014-10-24 DIAGNOSIS — I252 Old myocardial infarction: Secondary | ICD-10-CM | POA: Diagnosis not present

## 2014-10-24 DIAGNOSIS — M19079 Primary osteoarthritis, unspecified ankle and foot: Secondary | ICD-10-CM

## 2014-10-24 DIAGNOSIS — Z7982 Long term (current) use of aspirin: Secondary | ICD-10-CM | POA: Insufficient documentation

## 2014-10-24 DIAGNOSIS — Z951 Presence of aortocoronary bypass graft: Secondary | ICD-10-CM | POA: Diagnosis not present

## 2014-10-24 DIAGNOSIS — M549 Dorsalgia, unspecified: Secondary | ICD-10-CM | POA: Diagnosis not present

## 2014-10-24 MED ORDER — PREDNISONE 10 MG PO TABS: ORAL_TABLET | ORAL | Status: DC

## 2014-10-24 MED ORDER — ACETAMINOPHEN-CODEINE #3 300-30 MG PO TABS: 1.0000 | ORAL_TABLET | Freq: Four times a day (QID) | ORAL | Status: DC | PRN

## 2014-10-24 MED ORDER — ACETAMINOPHEN-CODEINE #3 300-30 MG PO TABS
2.0000 | ORAL_TABLET | Freq: Once | ORAL | Status: AC
  Administered 2014-10-24: 2 via ORAL
  Filled 2014-10-24: qty 2

## 2014-10-24 MED ORDER — PREDNISONE 10 MG PO TABS
60.0000 mg | ORAL_TABLET | Freq: Once | ORAL | Status: AC
  Administered 2014-10-24: 60 mg via ORAL
  Filled 2014-10-24 (×2): qty 1

## 2014-10-24 MED ORDER — ONDANSETRON HCL 4 MG PO TABS
4.0000 mg | ORAL_TABLET | Freq: Once | ORAL | Status: AC
  Administered 2014-10-24: 4 mg via ORAL
  Filled 2014-10-24: qty 1

## 2014-10-24 NOTE — ED Notes (Signed)
Pt reports right great toe pain since last night. Pt denies any known injury. No obvious deformity noted.

## 2014-10-24 NOTE — ED Provider Notes (Signed)
CSN: [id number]     Arrival date & time 10/24/14  0720 History   First MD Initiated Contact with Patient 10/24/14 0754     Chief Complaint  Patient presents with  . Toe Pain     (Consider location/radiation/quality/duration/timing/severity/associated sxs/prior Treatment) Patient is a 58 y.o. male presenting with toe pain. The history is provided by the patient.  Toe Pain This is a new problem. The current episode started yesterday. Associated symptoms include arthralgias and chest pain. Pertinent negatives include no abdominal pain, coughing or neck pain. The symptoms are aggravated by standing and walking. He has tried nothing for the symptoms. The treatment provided no relief.    Past Medical History  Diagnosis Date  . Mitral regurgitation     Moderate by TEE 03/27/12  . Low HDL (under 40)   . GERD (gastroesophageal reflux disease)   . Hypertension   . CAD (coronary artery disease)     5V CABG in 2004; stent to RCA x1 and SVG to acute marginal x2 2011;  PTCA/DES x 2 body of SVG to PDA  05/20/12  . CHF (congestive heart failure)      EF 55% by echo 03/27/2012  . Depression   . Numbness and tingling in hands   . Numbness and tingling of both legs   . Umbilical hernia     unrepaired (05/20/12)  . Arthritis     hands, shoulders, arms  . Chronic lower back pain     "w/activity"  . Anxiety   . HLD (hyperlipidemia)   . Myocardial infarction 2004  . Shortness of breath     ' WHEN MY HEART ACTS UP "   Past Surgical History  Procedure Laterality Date  . Tee without cardioversion  03/27/2012    Procedure: TRANSESOPHAGEAL ECHOCARDIOGRAM (TEE);  Surgeon: Laurey Moralealton S McLean, MD;  Location[facility] END[facility]COPY;  Service: Cardiovascular;  Laterality: N/A;  to be done at 1100  . Coronary artery bypass graft  2004    5v CABG   . Incision and drainage of wound  2010    "from bite; maybe snake or spider; almost lost LLE"  . Ptca  10/30/2013    DES  SVG    . Coronary angioplasty with stent placement   05/11    stent to RCA x 1 and SVG-acute marginal x2. EF normal  . Coronary angioplasty with stent placement  05/20/12    PTCA/DES x 2 body of SVG to PDA    Family History  Problem Relation Age of Onset  . Heart disease Mother   . Cancer Mother     ? type  . Diabetes Father   . Hyperlipidemia Father   . Hypertension Father   . Stomach cancer Father   . Heart disease Brother [id number]    X 5 brothers   History  Substance Use Topics  . Smoking status: Former Smoker -- 3.00 packs/day for 40 years    Types: Cigarettes    Quit date: 01/23/2003  . Smokeless tobacco: Never Used  . 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       All ROS Neg except as noted in HPI  Eyes: Negative for photophobia and discharge.  Respiratory: Negative for cough, shortness of breath and wheezing.   Cardiovascular: Positive for chest pain. Negative for palpitations.  Gastrointestinal: Negative for abdominal pain  and blood in stool.  Genitourinary: Negative for dysuria, frequency and hematuria.  Musculoskeletal: Positive for back pain and arthralgias. Negative for neck pain.  Skin: Negative.   Neurological: Negative for dizziness, seizures and speech difficulty.  Psychiatric/Behavioral: Negative for hallucinations and confusion. The patient is nervous/anxious.        Depression      Allergies  Sulfa antibiotics and Hydrocodone  Home Medications   Prior to Admission medications   Medication Sig Start Date End Date Taking? Authorizing Provider  aspirin EC 81 MG tablet Take 81 mg by mouth daily.    Historical Provider, MD  cetirizine (ZYRTEC) 10 MG tablet Take 1 tablet (10 mg total) by mouth daily. 07/08/14   Elenora GammaSamuel L Bradshaw, MD  citalopram (CELEXA) 40 MG tablet Take 1 tablet (40 mg total) by mouth daily. 09/23/13   Elenora GammaSamuel L Bradshaw, MD  clopidogrel (PLAVIX) 75 MG tablet Take 1  tablet (75 mg total) by mouth daily.    Elenora GammaSamuel L Bradshaw, MD  furosemide (LASIX) 20 MG tablet Take 1 tablet (20 mg total) by mouth daily. 07/08/14   Elenora GammaSamuel L Bradshaw, MD  hydrALAZINE (APRESOLINE) 10 MG tablet Take 1 tablet (10 mg total) by mouth 3 (three) times daily. 06/21/14   Elenora GammaSamuel L Bradshaw, MD  HYDROcodone-acetaminophen (NORCO/VICODIN) 5-325 MG per tablet Take 1 tablet by mouth every 6 (six) hours as needed for moderate pain. 12/07/13   Benny LennertJoseph L Zammit, MD  ibuprofen (ADVIL,MOTRIN) 200 MG tablet Take 600 mg by mouth every 6 (six) hours as needed. pain    Historical Provider, MD  lisinopril (PRINIVIL,ZESTRIL) 2.5 MG tablet Take 1 tablet (2.5 mg total) by mouth daily. 07/08/14 07/08/15  Elenora GammaSamuel L Bradshaw, MD  metoprolol tartrate (LOPRESSOR) 25 MG tablet Take 1 tablet (25 mg total) by mouth 2 (two) times daily. 07/08/14   Elenora GammaSamuel L Bradshaw, MD  mometasone (NASONEX) 50 MCG/ACT nasal spray Place 2 sprays into the nose daily. 07/08/14   Elenora GammaSamuel L Bradshaw, MD  Multiple Vitamin (MULTIVITAMIN WITH MINERALS) TABS tablet Take 1 tablet by mouth daily.    Historical Provider, MD  nitroGLYCERIN (NITROSTAT) 0.4 MG SL tablet Place 1 tablet (0.4 mg total) under the tongue every 5 (five) minutes as needed for chest pain. 05/25/13 05/25/14  Kathleene Hazelhristopher D McAlhany, MD  pantoprazole (PROTONIX) 40 MG tablet Take 1 tablet (40 mg total) by mouth daily. 09/23/13   Elenora GammaSamuel L Bradshaw, MD  potassium chloride (K-DUR,KLOR-CON) 10 MEQ tablet Take 1 tablet (10 mEq total) by mouth daily. 11/25/13 11/25/14  Kathleene Hazelhristopher D McAlhany, MD  simvastatin (ZOCOR) 40 MG tablet Take one tablet by mouth every other day. 07/08/14   Elenora GammaSamuel L Bradshaw, MD  terbinafine (LAMISIL) 250 MG tablet Take 1 tablet (250 mg total) by mouth daily. 07/08/14   Elenora GammaSamuel L Bradshaw, MD  tetrahydrozoline-zinc (VISINE-AC) 0.05-0.25 % ophthalmic solution Place 2 drops into both eyes 3 (three) times daily as needed (itching/burning).    Historical Provider, MD   BP 113/95 mmHg   Pulse 56  Temp(Src) 97.8 F (36.6 C) (Oral)  Resp 16  Ht 5\' 8"  (1.727 m)  Wt 175 lb (79.379 kg)  BMI 26.61 kg/m2  SpO2 97% Physical Exam  Constitutional: He is oriented to person, place, and time. He appears well-developed and well-nourished.  Non-toxic appearance.  HENT:  Head: Normocephalic.  Right Ear: Tympanic membrane and external ear normal.  Left Ear: Tympanic membrane and external ear normal.  Eyes: EOM and lids are normal. Pupils are equal, round, and reactive  to light.  Neck: Normal range of motion. Neck supple. Carotid bruit is not present.  Cardiovascular: Normal rate, regular rhythm, normal heart sounds, intact distal pulses and normal pulses.   Pulmonary/Chest: Breath sounds normal. No respiratory distress.  Abdominal: Soft. Bowel sounds are normal. There is no tenderness. There is no guarding.  Musculoskeletal: Normal range of motion.  Degenerative joint disease changes of multiple sites of the upper and lower extremities. There is pain with attempted range of motion of the first MP joint of the right foot. There is pain to palpation in the plantar-lateral surface of the right first toe. There no lesions noted between the toes. There is no injury or trauma related to the distal toe, there is no subungual hematoma. The dorsalis pedis and posterior tibial pulses are 2+. The Achilles tendon is intact. The foot is not hot.  Lymphadenopathy:       Head (right side): No submandibular adenopathy present.       Head (left side): No submandibular adenopathy present.    He has no cervical adenopathy.  Neurological: He is alert and oriented to person, place, and time. He has normal strength. No cranial nerve deficit or sensory deficit.  Skin: Skin is warm and dry.  Psychiatric: He has a normal mood and affect. His speech is normal.  Nursing note and vitals reviewed.   ED Course  Procedures (including critical care time) Labs Review Labs Reviewed - No data to display  Imaging  Review No results found.   EKG Interpretation None      MDM  Vital signs are within normal limits, there is no hot joints appreciated. Doubt septic joint. There is no puncture wound of the plantar surface, no lesions between the toes, doubt infectious process. Pain at the right first MP joint to palpation, and pain with extension of the right toe, suspect arthritis changes versus tendinitis of the right first toe.    Final diagnoses:  Arthritis of big toe    **I have reviewed nursing notes, vital signs, and all appropriate lab and imaging results for this patient.Kathie Dike, PA-C 10/24/14 1610  Hilario Quarry, MD 10/24/14 [phone]

## 2014-10-24 NOTE — Discharge Instructions (Signed)
Heating pad to foot and toe may be helpful. Use caution getting around until symptoms resolve. Prednisone taper as directed. Use tylenol for mild pain. Use tylenol codeine for more severe pain, take this medication with food. See your primary MD for follow up and recheck. Arthritis, Nonspecific Arthritis is pain, redness, warmth, or puffiness (inflammation) of a joint. The joint may be stiff or hurt when you move it. One or more joints may be affected. There are many types of arthritis. Your doctor may not know what type you have right away. The most common cause of arthritis is wear and tear on the joint (osteoarthritis). HOME CARE   Only take medicine as told by your doctor.  Rest the joint as much as possible.  Raise (elevate) your joint if it is puffy.  Use crutches if the painful joint is in your leg.  Drink enough fluids to keep your pee (urine) clear or pale yellow.  Follow your doctor's diet instructions.  Use cold packs for very bad joint pain for 10 to 15 minutes every hour. Ask your doctor if it is okay for you to use hot packs.  Exercise as told by your doctor.  Take a warm shower if you have stiffness in the morning.  Move your sore joints throughout the day. GET HELP RIGHT AWAY IF:   You have a fever.  You have very bad joint pain, puffiness, or redness.  You have many joints that are painful and puffy.  You are not getting better with treatment.  You have very bad back pain or leg weakness.  You cannot control when you poop (bowel movement) or pee (urinate).  You do not feel better in 24 hours or are getting worse.  You are having side effects from your medicine. MAKE SURE YOU:   Understand these instructions.  Will watch your condition.  Will get help right away if you are not doing well or get worse. Document Released: 01/30/2010 Document Revised: 05/06/2012 Document Reviewed: 01/30/2010 [facility] Patient Information 2015 [employer] [place]. This  information is not intended to replace advice given to you by your health care provider. Make sure you discuss any questions you have with your health care provider.

## 2014-10-27 ENCOUNTER — Encounter: Payer: Self-pay | Admitting: Family Medicine

## 2014-10-27 ENCOUNTER — Ambulatory Visit (INDEPENDENT_AMBULATORY_CARE_PROVIDER_SITE_OTHER): Payer: Medicare Other | Admitting: Family Medicine

## 2014-10-27 ENCOUNTER — Ambulatory Visit (INDEPENDENT_AMBULATORY_CARE_PROVIDER_SITE_OTHER): Payer: Medicare Other | Admitting: *Deleted

## 2014-10-27 VITALS — BP 117/76 | HR 53 | Temp 97.6°F | Ht 68.0 in | Wt 178.0 lb

## 2014-10-27 DIAGNOSIS — I25708 Atherosclerosis of coronary artery bypass graft(s), unspecified, with other forms of angina pectoris: Secondary | ICD-10-CM

## 2014-10-27 DIAGNOSIS — Z23 Encounter for immunization: Secondary | ICD-10-CM

## 2014-10-27 DIAGNOSIS — R05 Cough: Secondary | ICD-10-CM

## 2014-10-27 DIAGNOSIS — R059 Cough, unspecified: Secondary | ICD-10-CM

## 2014-10-27 DIAGNOSIS — B351 Tinea unguium: Secondary | ICD-10-CM

## 2014-10-27 DIAGNOSIS — H6991 Unspecified Eustachian tube disorder, right ear: Secondary | ICD-10-CM

## 2014-10-27 DIAGNOSIS — I5032 Chronic diastolic (congestive) heart failure: Secondary | ICD-10-CM

## 2014-10-27 DIAGNOSIS — I10 Essential (primary) hypertension: Secondary | ICD-10-CM

## 2014-10-27 DIAGNOSIS — M79674 Pain in right toe(s): Secondary | ICD-10-CM

## 2014-10-27 LAB — CBC WITH DIFFERENTIAL/PLATELET
Basophils Absolute: 0 10*3/uL (ref 0.0–0.1)
Basophils Relative: 0 % (ref 0–1)
Eosinophils Absolute: 0.2 10*3/uL (ref 0.0–0.7)
Eosinophils Relative: 2 % (ref 0–5)
HCT: 41.1 % (ref 39.0–52.0)
Hemoglobin: 13.9 g/dL (ref 13.0–17.0)
Lymphocytes Relative: 33 % (ref 12–46)
Lymphs Abs: 4 10*3/uL (ref 0.7–4.0)
MCH: 29.8 pg (ref 26.0–34.0)
MCHC: 33.8 g/dL (ref 30.0–36.0)
MCV: 88 fL (ref 78.0–100.0)
MPV: 9.8 fL (ref 9.4–12.4)
Monocytes Absolute: 1.1 10*3/uL — ABNORMAL HIGH (ref 0.1–1.0)
Monocytes Relative: 9 % (ref 3–12)
NEUTROS ABS: 6.8 10*3/uL (ref 1.7–7.7)
Neutrophils Relative %: 56 % (ref 43–77)
PLATELETS: 206 10*3/uL (ref 150–400)
RBC: 4.67 MIL/uL (ref 4.22–5.81)
RDW: 14 % (ref 11.5–15.5)
WBC: 12.1 10*3/uL — ABNORMAL HIGH (ref 4.0–10.5)

## 2014-10-27 LAB — URIC ACID: URIC ACID, SERUM: 5.3 mg/dL (ref 4.0–7.8)

## 2014-10-27 LAB — COMPREHENSIVE METABOLIC PANEL
ALT: 24 U/L (ref 0–53)
AST: 23 U/L (ref 0–37)
Albumin: 4.2 g/dL (ref 3.5–5.2)
Alkaline Phosphatase: 78 U/L (ref 39–117)
BUN: 17 mg/dL (ref 6–23)
CO2: 27 mEq/L (ref 19–32)
Calcium: 9.1 mg/dL (ref 8.4–10.5)
Chloride: 105 mEq/L (ref 96–112)
Creat: 1.11 mg/dL (ref 0.50–1.35)
GLUCOSE: 86 mg/dL (ref 70–99)
POTASSIUM: 3.9 meq/L (ref 3.5–5.3)
SODIUM: 139 meq/L (ref 135–145)
TOTAL PROTEIN: 6.7 g/dL (ref 6.0–8.3)
Total Bilirubin: 0.4 mg/dL (ref 0.2–1.2)

## 2014-10-27 MED ORDER — LOSARTAN POTASSIUM 50 MG PO TABS: 50.0000 mg | ORAL_TABLET | Freq: Every day | ORAL | Status: DC

## 2014-10-27 NOTE — Assessment & Plan Note (Signed)
Now described as nonproductive cough that's persisted since our last visit 4 months ago Effective treatment now for postnasal drip so I believe that's unlikely No persistent dyspnea to indicate COPD, however it is very possible in this patient with a robust smoking history Will consider ACE inhibitor induced cough and change over to losartan as described above

## 2014-10-27 NOTE — Patient Instructions (Signed)
Great to see you guys!   I'll send a letter about your labs if they are normal  Please come back next month for a follow up.

## 2014-10-27 NOTE — Assessment & Plan Note (Signed)
Resolved after a 12 week course of Lamisil Check labs

## 2014-10-27 NOTE — Assessment & Plan Note (Addendum)
Euvolemic, asymptomatic Recent echo with normal EF, 55-60%, grade 1 diastolic dysfunction Continue lasix at current dose Continue aspirin, statin changing Ace to ARB DC hydralazine

## 2014-10-27 NOTE — Addendum Note (Signed)
Addended by: Pamelia HoitBLOUNT, Sincerity Cedar C on: 10/27/2014 09:46 AM   Modules accepted: Orders

## 2014-10-27 NOTE — Progress Notes (Signed)
Patient ID: Frank [name], male   DOB: February 13, 1956, 58 y.o.   MRN: [id number]   HPI  Patient presents today for follow-up  Hypertension Not checking blood pressure at home No dyspnea, palpitations, leg edema Some intermittent chest pain with exertion which stops with rest  Tinea unguium Nail thickening and yellowing resolved after completing course of his Lamisil  CHF Only dyspnea with appropriate exercise No worsening and leg edema Taking meds as prescribed  ear crackling, eustachian tube disorder Resolved   Cough Continued now for 6+ months, now described as dry cough it's persistent. Eustachian tube dysfunction is improved with allergy medications, no dyspnea to indicate possible COPD however he does have greater than a 60-pack-year history of smoking  Right great toe pain Recent trip to the ER for right great toe pain, started on prednisone course and given Tylenol 3 Improving Describes it as erythematous on the bottom and stiff having lots of pain with movement. No significant dehydration or purine load the night before onset  Smoking status noted ROS: Per HPI  Objective: BP 117/76 mmHg  Pulse 53  Temp(Src) 97.6 F (36.4 C) (Oral)  Ht 5\' 8"  (1.727 m)  Wt 178 lb (80.74 kg)  BMI 27.07 kg/m2 Gen: NAD, alert, cooperative with exam HEENT: NCAT, nares slightly swollen and blue discoloration of nasal mucosa bilaterally, oropharynx clear CV: Bradycardia, no murmur Resp: CTABL, no wheezes, non-labored Ext: No edema, warm Neuro: Alert and oriented, No gross deficits MSK: Right toe with very slight tenderness to palpation first MTP, no erythema or warmth or swelling of the joint, no pain with passive or active movement  Assessment and plan:  Essential hypertension, benign controlled With concern for gout and dry cough discontinue ACE inhibitor and start losartan, which has a uricosuric effect Start losartan 50 mg, check renal function repeat next month with a blood  pressure check as well Considering very low dose of lisinopril will discontinue hydralazine as well F/u 1 month  Congestive heart failure Euvolemic, asymptomatic Recent echo with normal EF, 55-60%, grade 1 diastolic dysfunction Continue lasix at current dose Continue aspirin, statin changing Ace to ARB DC hydralazine   Tinea unguium Resolved after a 12 week course of Lamisil Check labs  Eustachian tube disorder Resolves, continue Zyrtec and Nasonex  Cough Now described as nonproductive cough that's persisted since our last visit 4 months ago Effective treatment now for postnasal drip so I believe that's unlikely No persistent dyspnea to indicate COPD, however it is very possible in this patient with a robust smoking history Will consider ACE inhibitor induced cough and change over to losartan as described above  Pain of right great toe Recent trip to the ER for this, improving on prednisone and Tylenol 3 History consistent with gout Checking uric acid to help solidify diagnosis Changing Ace inhibitor to Losartan which can help lower your uric acid     Orders Placed This Encounter  Procedures  . Comprehensive metabolic panel  . Uric Acid  . CBC with Differential    Meds ordered this encounter  Medications  . losartan (COZAAR) 50 MG tablet    Sig: Take 1 tablet (50 mg total) by mouth daily.    Dispense:  90 tablet    Refill:  3

## 2014-10-27 NOTE — Assessment & Plan Note (Signed)
Resolves, continue Zyrtec and Nasonex

## 2014-10-27 NOTE — Assessment & Plan Note (Signed)
Recent trip to the ER for this, improving on prednisone and Tylenol 3 History consistent with gout Checking uric acid to help solidify diagnosis Changing Ace inhibitor to Losartan which can help lower your uric acid

## 2014-10-27 NOTE — Assessment & Plan Note (Signed)
controlled With concern for gout and dry cough discontinue ACE inhibitor and start losartan, which has a uricosuric effect Start losartan 50 mg, check renal function repeat next month with a blood pressure check as well Considering very low dose of lisinopril will discontinue hydralazine as well F/u 1 month

## 2014-10-28 ENCOUNTER — Encounter (HOSPITAL_COMMUNITY): Payer: Self-pay | Admitting: Cardiovascular Disease

## 2014-10-29 ENCOUNTER — Other Ambulatory Visit: Payer: Self-pay | Admitting: Family Medicine

## 2014-11-01 ENCOUNTER — Encounter: Payer: Self-pay | Admitting: Family Medicine

## 2014-11-24 ENCOUNTER — Ambulatory Visit (INDEPENDENT_AMBULATORY_CARE_PROVIDER_SITE_OTHER): Payer: Medicare HMO | Admitting: Family Medicine

## 2014-11-24 ENCOUNTER — Encounter: Payer: Self-pay | Admitting: Family Medicine

## 2014-11-24 VITALS — BP 120/75 | HR 51 | Temp 98.0°F | Ht 68.0 in | Wt 180.4 lb

## 2014-11-24 DIAGNOSIS — R059 Cough, unspecified: Secondary | ICD-10-CM

## 2014-11-24 DIAGNOSIS — R05 Cough: Secondary | ICD-10-CM

## 2014-11-24 DIAGNOSIS — I10 Essential (primary) hypertension: Secondary | ICD-10-CM

## 2014-11-24 NOTE — Patient Instructions (Signed)
Great to see you!  Lets follow up in 3 months   

## 2014-11-24 NOTE — Progress Notes (Signed)
Patient ID: Frank [name], male   DOB: 1956/07/12, 59 y.o.   MRN: [id number]   HPI  Patient presents today for follow-up hypertension and cough  Hypertension Stable exertional chest pain No dyspnea, palpitations, leg edema Taking aspirin, low pressure medicines, and statin Has cardiology follow-up in one month  Cough Has improved quite a bit since stopping ACE inhibitor, however has continued Feels that he has lots of congestion due to the cold air Still taking Nasonex and Zyrtec States that it's not too bothersome  Smoking status noted ROS: Per HPI  Objective: BP 120/75 mmHg  Pulse 51  Temp(Src) 98 F (36.7 C) (Oral)  Ht 5\' 8"  (1.727 m)  Wt 180 lb 6.4 oz (81.829 kg)  BMI 27.44 kg/m2 Gen: NAD, alert, cooperative with exam HEENT: NCAT, turbinates swollen bilaterally CV: RRR, good S1/S2, no murmur Resp: CTABL, no wheezes, non-labored Ext: No edema Neuro: Alert and oriented, No gross deficits  Assessment and plan:  Cough Some improvement with change from ACE inhibitor to ARB Still some cough with lots of congestion, likely postnasal drip Reports this is not to bothersome and it has improved quite a bit since coming off of ACE inhibitor Continue Nasonex and Zyrtec Follow-up 3 months  Essential hypertension, benign Controlled Continue losartan, Lasix, metoprolol Follow-up 3 months, has cardiology follow-up in one month

## 2014-11-24 NOTE — Assessment & Plan Note (Signed)
Some improvement with change from ACE inhibitor to ARB Still some cough with lots of congestion, likely postnasal drip Reports this is not to bothersome and it has improved quite a bit since coming off of ACE inhibitor Continue Nasonex and Zyrtec Follow-up 3 months

## 2014-11-24 NOTE — Assessment & Plan Note (Signed)
Controlled Continue losartan, Lasix, metoprolol Follow-up 3 months, has cardiology follow-up in one month

## 2014-12-21 ENCOUNTER — Other Ambulatory Visit: Payer: Self-pay | Admitting: Family Medicine

## 2014-12-22 ENCOUNTER — Ambulatory Visit (INDEPENDENT_AMBULATORY_CARE_PROVIDER_SITE_OTHER): Payer: Medicare HMO | Admitting: Cardiovascular Disease

## 2014-12-22 ENCOUNTER — Encounter: Payer: Self-pay | Admitting: Cardiovascular Disease

## 2014-12-22 VITALS — BP 120/82 | HR 48 | Ht 66.0 in | Wt 186.8 lb

## 2014-12-22 DIAGNOSIS — E785 Hyperlipidemia, unspecified: Secondary | ICD-10-CM

## 2014-12-22 DIAGNOSIS — I340 Nonrheumatic mitral (valve) insufficiency: Secondary | ICD-10-CM

## 2014-12-22 DIAGNOSIS — R001 Bradycardia, unspecified: Secondary | ICD-10-CM

## 2014-12-22 DIAGNOSIS — R011 Cardiac murmur, unspecified: Secondary | ICD-10-CM

## 2014-12-22 DIAGNOSIS — I25810 Atherosclerosis of coronary artery bypass graft(s) without angina pectoris: Secondary | ICD-10-CM

## 2014-12-22 DIAGNOSIS — I10 Essential (primary) hypertension: Secondary | ICD-10-CM

## 2014-12-22 DIAGNOSIS — I5033 Acute on chronic diastolic (congestive) heart failure: Secondary | ICD-10-CM

## 2014-12-22 LAB — BASIC METABOLIC PANEL
BUN: 16 mg/dL (ref 6–23)
CHLORIDE: 106 meq/L (ref 96–112)
CO2: 28 meq/L (ref 19–32)
Calcium: 9 mg/dL (ref 8.4–10.5)
Creatinine, Ser: 1.16 mg/dL (ref 0.40–1.50)
GFR: 68.31 mL/min (ref >60.00)
Glucose, Bld: 102 mg/dL — ABNORMAL HIGH (ref 70–99)
Potassium: 4.3 mEq/L (ref 3.5–5.1)
Sodium: 139 mEq/L (ref 135–145)

## 2014-12-22 MED ORDER — FUROSEMIDE 40 MG PO TABS: 40.0000 mg | ORAL_TABLET | Freq: Every day | ORAL | Status: DC

## 2014-12-22 MED ORDER — METOPROLOL TARTRATE 25 MG PO TABS: 12.5000 mg | ORAL_TABLET | Freq: Two times a day (BID) | ORAL | Status: DC

## 2014-12-22 MED ORDER — POTASSIUM CHLORIDE CRYS ER 20 MEQ PO TBCR: 20.0000 meq | EXTENDED_RELEASE_TABLET | Freq: Every day | ORAL | Status: DC

## 2014-12-22 NOTE — Patient Instructions (Addendum)
Your physician wants you to follow-up in: 6 months. You will receive a reminder letter in the mail two months in advance. If you don't receive a letter, please call our office to schedule the follow-up appointment.  Your physician has recommended you make the following change in your medication:  Decrease metoprolol tartrate to 12.5 mg by mouth twice daily. Increase furosemide to 40 mg by mouth daily. Increase potassium to 20 meq by mouth daily  We will call you with results of lab work done today--BMP

## 2014-12-22 NOTE — Progress Notes (Signed)
History of Present Illness: 59 yo male with a history of CAD s/p CABG 2004 and stenting in 2011, HLD, HTN, GERD and mitral regurgitation who is here today for cardiac follow up. He had been followed by [facility]. I met him in May 2013 for the first time. Echo 01/31/12 with normal LV size and function with at least moderate MR. He told me at the first visit that he felt fatigued and had SOB. He had no LE edema, orthopnea or chest pain. I arranged a TEE on 03/27/12 to evaluate his MR and this showed mild to moderate MR. At his f/u visit 05/06/12, he had c/o exertional chest pressure, dyspnea and fatigue. I arranged a cardiac cath on 05/20/12. There was severe disease in the SVG to the PDA and 2 DES were placed in the SVG to the PDA. The LIMA was patent but the vein grafts to the OM, Diagonal and PLA were occluded. He had recurrence of angina December 2014. Repeat cardiac cath 10/30/13 with patent LIMA to LAD, patent SVG to PDA with severe disease. Unchanged disease in native vessels as below. 2 drug eluting stents were placed in the SVG to the PDA. Echo August 2015 with normal LV function, mild MR.   He is here today for follow up. He feels great. No chest pain or SOB.   Primary Care Physician: [facility]  Last Lipid Profile:Lipid Panel     Component Value Date/Time   CHOL 140 07/08/2014 0909   TRIG 184* 07/08/2014 0909   HDL 36* 07/08/2014 0909   CHOLHDL 3.9 07/08/2014 0909   VLDL 37 07/08/2014 0909   LDLCALC 67 07/08/2014 0909    Past Medical History  Diagnosis Date  . Mitral regurgitation     Moderate by TEE 03/27/12  . Low HDL (under 40)   . GERD (gastroesophageal reflux disease)   . Hypertension   . CAD (coronary artery disease)     5V CABG in 2004; stent to RCA x1 and SVG to acute marginal x2 2011;  PTCA/DES x 2 body of SVG to PDA  05/20/12  . CHF (congestive heart failure)      EF 55% by echo 03/27/2012  . Depression   . Numbness and tingling in hands   .  Numbness and tingling of both legs   . Umbilical hernia     unrepaired (05/20/12)  . Arthritis     hands, shoulders, arms  . Chronic lower back pain     "w/activity"  . Anxiety   . HLD (hyperlipidemia)   . Myocardial infarction 2004  . Shortness of breath     ' WHEN MY HEART ACTS UP "    Past Surgical History  Procedure Laterality Date  . Tee without cardioversion  03/27/2012    Procedure: TRANSESOPHAGEAL ECHOCARDIOGRAM (TEE);  Surgeon: Larey Dresser, MD;  Location: [facility] ENDOSCOPY;  Service: Cardiovascular;  Laterality: N/A;  to be done at 1100  . Coronary artery bypass graft  2004    5v CABG   . Incision and drainage of wound  2010    "from bite; maybe snake or spider; almost lost LLE"  . Ptca  10/30/2013    DES  SVG    . Coronary angioplasty with stent placement  05/11    stent to RCA x 1 and SVG-acute marginal x2. EF normal  . Coronary angioplasty with stent placement  05/20/12    PTCA/DES x 2 body of SVG to PDA   .  Left heart catheterization with coronary/graft angiogram N/A 05/20/2012    Procedure: LEFT HEART CATHETERIZATION WITH Beatrix Fetters;  Surgeon: Burnell Blanks, MD;  Location: [facility] CATH LAB;  Service: Cardiovascular;  Laterality: N/A;  . Left heart catheterization with coronary/graft angiogram N/A 10/30/2013    Procedure: LEFT HEART CATHETERIZATION WITH Beatrix Fetters;  Surgeon: Burnell Blanks, MD;  Location: [facility] CATH LAB;  Service: Cardiovascular;  Laterality: N/A;  . Percutaneous coronary stent intervention (pci-s)  10/30/2013    Procedure: PERCUTANEOUS CORONARY STENT INTERVENTION (PCI-S);  Surgeon: Burnell Blanks, MD;  Location: [facility] CATH LAB;  Service: Cardiovascular;;    Current Outpatient Prescriptions  Medication Sig Dispense Refill  . acetaminophen-codeine (TYLENOL #3) 300-30 MG per tablet Take 1-2 tablets by mouth every 6 (six) hours as needed. 20 tablet 0  . aspirin EC 81 MG tablet Take 81 mg by mouth daily.    .  cetirizine (ZYRTEC) 10 MG tablet Take 1 tablet (10 mg total) by mouth daily. 30 tablet 11  . citalopram (CELEXA) 40 MG tablet Take 1 tablet (40 mg total) by mouth daily. 90 tablet 3  . clopidogrel (PLAVIX) 75 MG tablet Take 1 tablet (75 mg total) by mouth daily. 90 tablet 3  . furosemide (LASIX) 20 MG tablet Take 1 tablet (20 mg total) by mouth daily. 90 tablet 3  . ibuprofen (ADVIL,MOTRIN) 200 MG tablet Take 600 mg by mouth every 6 (six) hours as needed. pain    . losartan (COZAAR) 50 MG tablet Take 1 tablet (50 mg total) by mouth daily. 90 tablet 3  . metoprolol tartrate (LOPRESSOR) 25 MG tablet Take 1 tablet (25 mg total) by mouth 2 (two) times daily. 180 tablet 3  . mometasone (NASONEX) 50 MCG/ACT nasal spray Place 2 sprays into the nose daily. 17 g 12  . Multiple Vitamin (MULTIVITAMIN WITH MINERALS) TABS tablet Take 1 tablet by mouth daily.    . pantoprazole (PROTONIX) 40 MG tablet Take 1 tablet (40 mg total) by mouth daily. 90 tablet 3  . simvastatin (ZOCOR) 40 MG tablet Take one tablet by mouth every other day. 45 tablet 11  . tetrahydrozoline-zinc (VISINE-AC) 0.05-0.25 % ophthalmic solution Place 2 drops into both eyes 3 (three) times daily as needed (itching/burning).    . nitroGLYCERIN (NITROSTAT) 0.4 MG SL tablet Place 1 tablet (0.4 mg total) under the tongue every 5 (five) minutes as needed for chest pain. 25 tablet 6  . potassium chloride (K-DUR,KLOR-CON) 10 MEQ tablet Take 1 tablet (10 mEq total) by mouth daily. 90 tablet 3   No current facility-administered medications for this visit.    Allergies  Allergen Reactions  . Sulfa Antibiotics Rash  . Hydrocodone Hives and Itching    History   Social History  . Marital Status: Married    Spouse Name: N/A    Number of Children: 3  . Years of Education: N/A   Occupational History  . 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 Smoking status: Former Smoker -- 3.00 packs/day for 40 years    Types: Cigarettes    Quit  date: 01/23/2003  . Smokeless tobacco: Never Used  . 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 Drug Use: No     Comment: 05/20/12 "last marijuana was 2004"  . Sexual Activity: Yes   Other Topics Concern  . Not on file   Social History Narrative  Family History  Problem Relation Age of Onset  . Heart disease Mother   . Cancer Mother     ? type  . Diabetes Father   . Hyperlipidemia Father   . Hypertension Father   . Stomach cancer Father   . Heart disease Brother 46    X 5 brothers    Review of Systems:  As stated in the HPI and otherwise negative.   BP 120/82 mmHg  Pulse 48  Ht $R'5\' 6"'XE$  (1.676 m)  Wt 186 lb 12.8 oz (84.732 kg)  BMI 30.16 kg/m2  Physical Examination: General: Well developed, well nourished, NAD HEENT: OP clear, mucus membranes moist SKIN: warm, dry. No rashes. Neuro: No focal deficits Musculoskeletal: Muscle strength 5/5 all ext Psychiatric: Mood and affect normal Neck: No JVD, no carotid bruits, no thyromegaly, no lymphadenopathy. Lungs:Clear bilaterally, no wheezes, rhonci, crackles Cardiovascular: Regular rate and rhythm. No murmurs, gallops or rubs. Abdomen:Soft. Bowel sounds present. Non-tender.  Extremities: No lower extremity edema. Pulses are 2 + in the bilateral DP/PT.  Cardiac cath 10/30/13: Left main: No obstructive disease noted.  Left Anterior Descending Artery: Moderate sized vessel that courses to the apex. There is diffuse proximal 80% stenosis and 100% mid occlusion. The mid and distal vessel fills from the patent LIMA graft.  Circumflex Artery: Moderate sized vessel with mild plaque. The very small caliber OM branch has 80% ostial stenosis. The Ramus intermediate branch is moderate in caliber with proximal 50% stenosis which appears unchanged from last cath.  Right Coronary Artery: Small to moderate caliber vessel with diffuse plaque. There is a patent stent mid vessel with  40-50% in-stent restenosis, unchanged from last cath. The PDA is occluded and fills from the graft. The posterolateral segment fills antegrade. There appears to be a 99% stenosis leading into the small PL segment at the site of the old graft anastamosis at the suture line. This vessel is small in caliber, 1.75 mm in diameter.  Graft Anatomy:  SVG to PDA is patent with stents present in the proximal/mid and distal body of vein graft. There is 70-80% stenosis in the ostium of the vein graft extending down into the stented segment. The mid stented segment has diffuse 60-70% restenosis. The distal stented segment has 90% restenosis. Just beyond the distal stent there is a 60-70% stenosis. The SVG limb to the PLA is known to be occluded.  SVG to OM is known to be occluded and was not selectively engaged.  SVG to Diagonal is known to be occluded and was not selectively engaged.  LIMA to LAD was patent.  Left Ventricular Angiogram: LVEF 55% with moderate MR  Echo August 2015: Left ventricle: The cavity size was normal. Systolic function was normal. The estimated ejection fraction was in the range of 55% to 60%. Wall motion was normal; there were no regional wall motion abnormalities. Doppler parameters are consistent with abnormal left ventricular relaxation (grade 1 diastolic dysfunction). There was no evidence of elevated ventricular filling pressure by Doppler parameters. - Aortic valve: Trileaflet; normal thickness leaflets. There was no regurgitation. - Aortic root: The aortic root was normal in size. - Mitral valve: Thickened anterior leaflet of the mitral valve with mild prolapse. There was mild regurgitation directed posteriorly. - Left atrium: The atrium was mildly dilated. - Right ventricle: Systolic function was normal. - Right atrium: The atrium was normal in size. - Tricuspid valve: There was no regurgitation. - Pericardium, extracardiac: There was no pericardial  effusion. Impressions: - Normal biventricular  size and systolic function. Mild prolapse of the anterior leaflet of the mitral valve with mild mitral regurgitation with posteriorly directed jet.   Assessment and Plan:   1. CAD: He is having no exertional chest pain. Still having fatigue but not similar to his fatigue before last PCI. Will continue ASA and Plavix. Continue other cardiac meds. Will lower metoprolol to 12.5 mg po BID with bradycardia.   2. HTN: BP well controlled. No changes.   3. Hyperlipidemia:  Lipids well controlled. He takes Zocor every other day.   4. Mitral regurgitation: Mild by echo August 2015.    5. Sinus brady: Lower metoprolol to 12.5 mg po BID and see if this helps with his fatigue.   6. Acute on chronic diastolic CHF: He is up 6 lbs over last 4 weeks. Some dyspnea. Will increase Lasix to 40 mg daily and increase KDur to 20 meq daily. Check BMET.

## 2015-05-18 ENCOUNTER — Telehealth: Payer: Self-pay | Admitting: Cardiovascular Disease

## 2015-05-18 ENCOUNTER — Ambulatory Visit (INDEPENDENT_AMBULATORY_CARE_PROVIDER_SITE_OTHER): Payer: Medicare HMO | Admitting: Family Medicine

## 2015-05-18 ENCOUNTER — Encounter: Payer: Self-pay | Admitting: Family Medicine

## 2015-05-18 VITALS — BP 118/73 | HR 62 | Temp 97.7°F | Ht 68.0 in | Wt 178.5 lb

## 2015-05-18 DIAGNOSIS — I10 Essential (primary) hypertension: Secondary | ICD-10-CM | POA: Diagnosis not present

## 2015-05-18 DIAGNOSIS — I5032 Chronic diastolic (congestive) heart failure: Secondary | ICD-10-CM | POA: Diagnosis not present

## 2015-05-18 DIAGNOSIS — I25708 Atherosclerosis of coronary artery bypass graft(s), unspecified, with other forms of angina pectoris: Secondary | ICD-10-CM | POA: Diagnosis not present

## 2015-05-18 NOTE — Assessment & Plan Note (Signed)
Extensive CAD with Hx of CABG and several stents 2 months of exertional dyspnea and stable burning chest pain, given Hx I recommended close f/u with cardiology Discussed red flags to seek immediate help

## 2015-05-18 NOTE — Assessment & Plan Note (Signed)
Controlled On BB, ARB, loop - continue Labs [place]

## 2015-05-18 NOTE — Telephone Encounter (Signed)
Follow Up ° °Pt wife returned the call  °

## 2015-05-18 NOTE — Telephone Encounter (Signed)
Spoke with pt's wife and pt will be here for appt tomorrow. Wife aware if symptoms worsen pt should go to ED.

## 2015-05-18 NOTE — Telephone Encounter (Signed)
New Message   Pt Mother in law is calling in for pt   PC[facility] that pt needs to see the Dr. Clifton JamesMcAlhany  Sooner than appt in 08-03-15 mother in law states that pt is having shortness of breath and Burning paine in chest.

## 2015-05-18 NOTE — Patient Instructions (Signed)
Great to see you!  Call Dr. Sanjuana KavaMcAlhaney for an appointment in the next week or two  Be sure that if your chest pain happens at rest or doesn't resolve that you seek medical attention immediately  Angina Pectoris Angina pectoris, often just called angina, is extreme discomfort in your chest, neck, or arm caused by a lack of blood in the middle and thickest layer of your heart wall (myocardium). It may feel like tightness or heavy pressure. It may feel like a crushing or squeezing pain. Some people say it feels like gas or indigestion. It may go down your shoulders, back, and arms. Some people may have symptoms other than pain. These symptoms include fatigue, shortness of breath, cold sweats, or nausea. There are four different types of angina:  Stable angina--Stable angina usually occurs in episodes of predictable frequency and duration. It usually is brought on by physical activity, emotional stress, or excitement. These are all times when the myocardium needs more oxygen. Stable angina usually lasts a few minutes and often is relieved by taking a medicine that can be taken under your tongue (sublingually). The medicine is called nitroglycerin. Stable angina is caused by a buildup of plaque inside the arteries, which restricts blood flow to the heart muscle (atherosclerosis).  Unstable angina--Unstable angina can occur even when your body experiences little or no physical exertion. It can occur during sleep. It can also occur at rest. It can suddenly increase in severity or frequency. It might not be relieved by sublingual nitroglycerin. It can last up to 30 minutes. The most common cause of unstable angina is a blood clot that has developed on the top of plaque buildup inside a coronary artery. It can lead to a heart attack if the blood clot completely blocks the artery.  Microvascular angina--This type of angina is caused by a disorder of tiny blood vessels called arterioles. Microvascular angina is  more common in women. The pain may be more severe and last longer than other types of angina pectoris.  Prinzmetal or variant angina--This type of angina pectoris usually occurs when your body experiences little or no physical exertion. It especially occurs in the early morning hours. It is caused by a spasm of your coronary artery. HOME CARE INSTRUCTIONS   Only take over-the-counter and prescription medicines as directed by your health care provider.  Stay active or increase your exercise as directed by your health care provider.  Limit strenuous activity as directed by your health care provider.  Limit heavy lifting as directed by your health care provider.  Maintain a healthy weight.  Learn about and eat heart-healthy foods.  Do not use any tobacco products including cigarettes, chewing tobacco or electronic cigarettes. SEEK IMMEDIATE MEDICAL CARE IF:  You experience the following symptoms:  Chest, neck, deep shoulder, or arm pain or discomfort that lasts more than a few minutes.  Chest, neck, deep shoulder, or arm pain or discomfort that goes away and comes back, repeatedly.  Heavy sweating with discomfort, without a noticeable cause.  Shortness of breath or difficulty breathing.  Angina that does not get better after a few minutes of rest or after taking sublingual nitroglycerin. These can all be symptoms of a heart attack, which is a medical emergency! Get medical help at once. Call your local emergency service (911 in [place] immediately. Do not  drive yourself to the hospital and do not  wait to for your symptoms to go away. MAKE SURE YOU:  Understand these instructions.  Will  watch your condition.  Will get help right away if you are not doing well or get worse. Document Released: 11/05/2005 Document Revised: 11/10/2013 Document Reviewed: 03/09/2014 [facility] Patient Information 2015 [place], [place]. This information is not intended to replace advice given to you by your  health care provider. Make sure you discuss any questions you have with your health care provider.

## 2015-05-18 NOTE — Progress Notes (Signed)
Patient ID: Frank [name], male   DOB: 10-13-1956, 59 y.o.   MRN: [id number]   HPI  Patient presents today for  Follow-up  Hypertension  20 minutes 2-3 times per day Taking meds everyday, confirmed from memory antihypertensives medications  chest pain described below  , dyspnea as well  No leg edema palpitations or headache.    chest pain Has extensive history of heart disease with a CABG in 2004 and then several stents after that.  describes two-month history of exertional dyspnea and chest pain with activity. He does have some dyspnea in hot weather which sounds appropriate but he also has predictable chest pain with walking or with walking a short distance with a 20+ lb weight in his hand.  he does not have symptoms at rest, the symptoms resolved predictably with rest.  he does not use nitroglycerin although he does have a home.  he denies orthopnea    PMH: Smoking status noted - former ROS: Per HPI  Objective: BP 118/73 mmHg  Pulse 62  Temp(Src) 97.7 F (36.5 C) (Oral)  Ht 5\' 8"  (1.727 m)  Wt 178 lb 8 oz (80.967 kg)  BMI 27.15 kg/m2 Gen: NAD, alert, cooperative with exam HEENT: NCAT CV: RRR, good S1/S2, no murmur Resp: CTABL, no wheezes, non-labored - no crackles Ext: No edema, warm Neuro: Alert and oriented, No gross deficits  Assessment and plan:  CAD Extensive CAD with Hx of CABG and several stents 2 months of exertional dyspnea and stable burning chest pain, given Hx I recommended close f/u with cardiology Discussed red flags to seek immediate help  Congestive heart failure Chronic diastolic Good diuresis from lasix, continue No orthopnea or crackles  Essential hypertension, benign Controlled On BB, ARB, loop - continue Labs [place]

## 2015-05-18 NOTE — Telephone Encounter (Signed)
Appt made for pt to see Dr. Clifton JamesMcAlhany tomorrow at 8:30.  I spoke with pt's mother in law Gypsy Decant(Bonnie Hardy) and gave her appt time.  She is not sure this appt time will work for pt and will have him call back to confirm.  Per primary care note pt to see Dr. Clifton JamesMcAlhany in next 2 weeks.

## 2015-05-18 NOTE — Assessment & Plan Note (Signed)
Chronic diastolic Good diuresis from lasix, continue No orthopnea or crackles

## 2015-05-18 NOTE — Addendum Note (Signed)
Addended by: Elenora GammaBRADSHAW, SAMUEL L on: 05/18/2015 10:46 AM   Modules accepted: Orders, Medications

## 2015-05-19 ENCOUNTER — Encounter: Payer: Self-pay | Admitting: *Deleted

## 2015-05-19 ENCOUNTER — Encounter: Payer: Self-pay | Admitting: Cardiovascular Disease

## 2015-05-19 ENCOUNTER — Ambulatory Visit (INDEPENDENT_AMBULATORY_CARE_PROVIDER_SITE_OTHER): Payer: Medicare HMO | Admitting: Cardiovascular Disease

## 2015-05-19 VITALS — BP 106/70 | HR 53 | Ht 67.0 in | Wt 179.1 lb

## 2015-05-19 DIAGNOSIS — I5032 Chronic diastolic (congestive) heart failure: Secondary | ICD-10-CM

## 2015-05-19 DIAGNOSIS — I340 Nonrheumatic mitral (valve) insufficiency: Secondary | ICD-10-CM

## 2015-05-19 DIAGNOSIS — E785 Hyperlipidemia, unspecified: Secondary | ICD-10-CM

## 2015-05-19 DIAGNOSIS — I10 Essential (primary) hypertension: Secondary | ICD-10-CM | POA: Diagnosis not present

## 2015-05-19 DIAGNOSIS — I25110 Atherosclerotic heart disease of native coronary artery with unstable angina pectoris: Secondary | ICD-10-CM | POA: Diagnosis not present

## 2015-05-19 LAB — BASIC METABOLIC PANEL
BUN: 16 mg/dL (ref 6–23)
CHLORIDE: 103 meq/L (ref 96–112)
CO2: 29 mEq/L (ref 19–32)
CREATININE: 1.15 mg/dL (ref 0.40–1.50)
Calcium: 9.7 mg/dL (ref 8.4–10.5)
GFR: 68.91 mL/min (ref >60.00)
GLUCOSE: 92 mg/dL (ref 70–99)
Potassium: 4.2 mEq/L (ref 3.5–5.1)
Sodium: 139 mEq/L (ref 135–145)

## 2015-05-19 LAB — CBC WITH DIFFERENTIAL/PLATELET
Basophils Absolute: 0.1 10*3/uL (ref 0.0–0.1)
Basophils Relative: 0.7 % (ref 0.0–3.0)
Eosinophils Absolute: 0.6 10*3/uL (ref 0.0–0.7)
Eosinophils Relative: 7.7 % — ABNORMAL HIGH (ref 0.0–5.0)
HEMATOCRIT: 45.6 % (ref 39.0–52.0)
Hemoglobin: 15.5 g/dL (ref 13.0–17.0)
LYMPHS ABS: 1.9 10*3/uL (ref 0.7–4.0)
Lymphocytes Relative: 22.5 % (ref 12.0–46.0)
MCHC: 34 g/dL (ref 30.0–36.0)
MCV: 88.2 fl (ref 78.0–100.0)
MONO ABS: 1.1 10*3/uL — AB (ref 0.1–1.0)
Monocytes Relative: 13.1 % — ABNORMAL HIGH (ref 3.0–12.0)
NEUTROS PCT: 56 % (ref 43.0–77.0)
Neutro Abs: 4.7 10*3/uL (ref 1.4–7.7)
Platelets: 212 10*3/uL (ref 150.0–400.0)
RBC: 5.17 Mil/uL (ref 4.22–5.81)
RDW: 13.2 % (ref 11.5–15.5)
WBC: 8.3 10*3/uL (ref 4.0–10.5)

## 2015-05-19 LAB — PROTIME-INR
INR: 1 ratio (ref 0.8–1.0)
PROTHROMBIN TIME: 10.7 s (ref 9.6–13.1)

## 2015-05-19 NOTE — Progress Notes (Signed)
Chief Complaint  Patient presents with  . Chest Pain     History of Present Illness: 59 yo male with a history of CAD s/p CABG 2004 and stenting in 2011, HLD, HTN, GERD and mitral regurgitation who is here today for cardiac follow up. He had been followed by [facility]. I met him in May 2013 for the first time. Echo 01/31/12 with normal LV size and function with at least moderate MR. He told me at the first visit that he felt fatigued and had SOB. He had no LE edema, orthopnea or chest pain. I arranged a TEE on 03/27/12 to evaluate his MR and this showed mild to moderate MR. At his f/u visit 05/06/12, he had c/o exertional chest pressure, dyspnea and fatigue. I arranged a cardiac cath on 05/20/12. There was severe disease in the SVG to the PDA and 2 DES were placed in the SVG to the PDA. The LIMA was patent but the vein grafts to the OM, Diagonal and PLA were occluded. He had recurrence of angina December 2014. Repeat cardiac cath 10/30/13 with patent LIMA to LAD, patent SVG to PDA with severe disease. Unchanged disease in native vessels as below. 2 drug eluting stents were placed in the SVG to the PDA. Echo August 2015 with normal LV function, mild MR. I last saw him in February 2016 and he was feeling great.   He is added onto my schedule today after he was seen in primary care yesterday and c/o exertional dyspnea and burning type chest pain.   Primary Care Physician: [facility]  Past Medical History  Diagnosis Date  . Mitral regurgitation     Moderate by TEE 03/27/12  . Low HDL (under 40)   . GERD (gastroesophageal reflux disease)   . Hypertension   . CAD (coronary artery disease)     5V CABG in 2004; stent to RCA x1 and SVG to acute marginal x2 2011;  PTCA/DES x 2 body of SVG to PDA  05/20/12  . CHF (congestive heart failure)      EF 55% by echo 03/27/2012  . Depression   . Numbness and tingling in hands   . Numbness and tingling of both legs   . Umbilical hernia    unrepaired (05/20/12)  . Arthritis     hands, shoulders, arms  . Chronic lower back pain     "w/activity"  . Anxiety   . HLD (hyperlipidemia)   . Myocardial infarction 2004  . Shortness of breath     ' WHEN MY HEART ACTS UP "    Past Surgical History  Procedure Laterality Date  . Tee without cardioversion  03/27/2012    Procedure: TRANSESOPHAGEAL ECHOCARDIOGRAM (TEE);  Surgeon: Larey Dresser, MD;  Location: [facility] ENDOSCOPY;  Service: Cardiovascular;  Laterality: N/A;  to be done at 1100  . Coronary artery bypass graft  2004    5v CABG   . Incision and drainage of wound  2010    "from bite; maybe snake or spider; almost lost LLE"  . Ptca  10/30/2013    DES  SVG    . Coronary angioplasty with stent placement  05/11    stent to RCA x 1 and SVG-acute marginal x2. EF normal  . Coronary angioplasty with stent placement  05/20/12    PTCA/DES x 2 body of SVG to PDA   . Left heart catheterization with coronary/graft angiogram N/A 05/20/2012    Procedure: LEFT HEART CATHETERIZATION WITH CORONARY/GRAFT  ANGIOGRAM;  Surgeon: Burnell Blanks, MD;  Location: [facility] CATH LAB;  Service: Cardiovascular;  Laterality: N/A;  . Left heart catheterization with coronary/graft angiogram N/A 10/30/2013    Procedure: LEFT HEART CATHETERIZATION WITH Beatrix Fetters;  Surgeon: Burnell Blanks, MD;  Location: [facility] CATH LAB;  Service: Cardiovascular;  Laterality: N/A;  . Percutaneous coronary stent intervention (pci-s)  10/30/2013    Procedure: PERCUTANEOUS CORONARY STENT INTERVENTION (PCI-S);  Surgeon: Burnell Blanks, MD;  Location: [facility] CATH LAB;  Service: Cardiovascular;;    Current Outpatient Prescriptions  Medication Sig Dispense Refill  . aspirin EC 81 MG tablet Take 81 mg by mouth daily.    . cetirizine (ZYRTEC) 10 MG tablet Take 1 tablet (10 mg total) by mouth daily. 30 tablet 11  . citalopram (CELEXA) 40 MG tablet Take 1 tablet (40 mg total) by mouth daily. 90 tablet 3  . clopidogrel  (PLAVIX) 75 MG tablet Take 1 tablet (75 mg total) by mouth daily. 90 tablet 3  . furosemide (LASIX) 40 MG tablet Take 1 tablet (40 mg total) by mouth daily. 90 tablet 3  . ibuprofen (ADVIL,MOTRIN) 200 MG tablet Take 600 mg by mouth every 6 (six) hours as needed. pain    . losartan (COZAAR) 50 MG tablet Take 1 tablet (50 mg total) by mouth daily. 90 tablet 3  . metoprolol tartrate (LOPRESSOR) 25 MG tablet Take 0.5 tablets (12.5 mg total) by mouth 2 (two) times daily. 90 tablet 3  . mometasone (NASONEX) 50 MCG/ACT nasal spray Place 2 sprays into the nose daily. 17 g 12  . Multiple Vitamin (MULTIVITAMIN WITH MINERALS) TABS tablet Take 1 tablet by mouth daily.    . pantoprazole (PROTONIX) 40 MG tablet Take 1 tablet (40 mg total) by mouth daily. 90 tablet 3  . potassium chloride (K-DUR,KLOR-CON) 20 MEQ tablet Take 1 tablet (20 mEq total) by mouth daily. 90 tablet 3  . simvastatin (ZOCOR) 40 MG tablet Take one tablet by mouth every other day. 45 tablet 11  . tetrahydrozoline-zinc (VISINE-AC) 0.05-0.25 % ophthalmic solution Place 2 drops into both eyes 3 (three) times daily as needed (itching/burning).    . nitroGLYCERIN (NITROSTAT) 0.4 MG SL tablet Place 1 tablet (0.4 mg total) under the tongue every 5 (five) minutes as needed for chest pain. 25 tablet 6   No current facility-administered medications for this visit.    Allergies  Allergen Reactions  . Sulfa Antibiotics Rash  . Hydrocodone Hives and Itching    History   Social History  . Marital Status: Married    Spouse Name: N/A  . Number of Children: 3  . Years of Education: N/A   Occupational History  . 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 Smoking status: Former Smoker -- 3.00 packs/day for 40 years    Types: Cigarettes    Quit date: 01/23/2003  . Smokeless tobacco: Never Used  . 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 Drug Use: No     Comment: 05/20/12  "last marijuana was 2004"  . Sexual Activity: Yes   Other Topics Concern  . Not on file   Social History Narrative    Family History  Problem Relation Age of Onset  . Heart disease Mother   . Cancer Mother     ? type  . Diabetes Father   . Hyperlipidemia Father   . Hypertension Father   .  Stomach cancer Father   . Heart disease Brother 27    X 5 brothers    Review of Systems:  As stated in the HPI and otherwise negative.   BP 106/70 mmHg  Pulse 53  Ht _0  (1.702 m)  Wt 179 lb 1.9 oz (81.248 kg)  BMI 28.05 kg/m2  SpO2 94%  Physical Examination: General: Well developed, well nourished, NAD HEENT: OP clear, mucus membranes moist SKIN: warm, dry. No rashes. Neuro: No focal deficits Musculoskeletal: Muscle strength 5/5 all ext Psychiatric: Mood and affect normal Neck: No JVD, no carotid bruits, no thyromegaly, no lymphadenopathy. Lungs:Clear bilaterally, no wheezes, rhonci, crackles Cardiovascular: Regular rate and rhythm. No murmurs, gallops or rubs. Abdomen:Soft. Bowel sounds present. Non-tender.  Extremities: No lower extremity edema. Pulses are 2 + in the bilateral DP/PT.  Cardiac cath 10/30/13: Left main: No obstructive disease noted.  Left Anterior Descending Artery: Moderate sized vessel that courses to the apex. There is diffuse proximal 80% stenosis and 100% mid occlusion. The mid and distal vessel fills from the patent LIMA graft.  Circumflex Artery: Moderate sized vessel with mild plaque. The very small caliber OM branch has 80% ostial stenosis. The Ramus intermediate branch is moderate in caliber with proximal 50% stenosis which appears unchanged from last cath.  Right Coronary Artery: Small to moderate caliber vessel with diffuse plaque. There is a patent stent mid vessel with 40-50% in-stent restenosis, unchanged from last cath. The PDA is occluded and fills from the graft. The posterolateral segment fills antegrade. There appears to be a 99% stenosis  leading into the small PL segment at the site of the old graft anastamosis at the suture line. This vessel is small in caliber, 1.75 mm in diameter.  Graft Anatomy:  SVG to PDA is patent with stents present in the proximal/mid and distal body of vein graft. There is 70-80% stenosis in the ostium of the vein graft extending down into the stented segment. The mid stented segment has diffuse 60-70% restenosis. The distal stented segment has 90% restenosis. Just beyond the distal stent there is a 60-70% stenosis. The SVG limb to the PLA is known to be occluded.  SVG to OM is known to be occluded and was not selectively engaged.  SVG to Diagonal is known to be occluded and was not selectively engaged.  LIMA to LAD was patent.  Left Ventricular Angiogram: LVEF 55% with moderate MR  Echo August 2015: Left ventricle: The cavity size was normal. Systolic function was normal. The estimated ejection fraction was in the range of 55% to 60%. Wall motion was normal; there were no regional wall motion abnormalities. Doppler parameters are consistent with abnormal left ventricular relaxation (grade 1 diastolic dysfunction). There was no evidence of elevated ventricular filling pressure by Doppler parameters. - Aortic valve: Trileaflet; normal thickness leaflets. There was no regurgitation. - Aortic root: The aortic root was normal in size. - Mitral valve: Thickened anterior leaflet of the mitral valve with mild prolapse. There was mild regurgitation directed posteriorly. - Left atrium: The atrium was mildly dilated. - Right ventricle: Systolic function was normal. - Right atrium: The atrium was normal in size. - Tricuspid valve: There was no regurgitation. - Pericardium, extracardiac: There was no pericardial effusion. Impressions: - Normal biventricular size and systolic function. Mild prolapse of the anterior leaflet of the mitral valve with mild mitral regurgitation with  posteriorly directed jet.  EKG:  EKG is ordered today. The ekg ordered today demonstrates sinus brady, rate 53  bpm. 1st degree AV block. Old inferior MI  Recent Labs: 10/27/2014: ALT 24; Hemoglobin 13.9; Platelets 206 12/22/2014: BUN 16; Creatinine, Ser 1.16; Potassium 4.3; Sodium 139   Lipid Panel    Component Value Date/Time   CHOL 140 07/08/2014 0909   TRIG 184* 07/08/2014 0909   HDL 36* 07/08/2014 0909   CHOLHDL 3.9 07/08/2014 0909   VLDL 37 07/08/2014 0909   LDLCALC 67 07/08/2014 0909     Wt Readings from Last 3 Encounters:  05/19/15 179 lb 1.9 oz (81.248 kg)  05/18/15 178 lb 8 oz (80.967 kg)  12/22/14 186 lb 12.8 oz (84.732 kg)     Other studies Reviewed: Additional studies/ records that were reviewed today include: . Review of the above records demonstrates:    Assessment and Plan:   1. CAD/Unstable angina: He is having exertional chest pain and dyspnea c/w unstable angina. Will continue ASA and Plavix. Continue other cardiac meds. Will arrange left heart cath with possible PCI on July 8th, 2016. Risks and benefits reviewed. He agrees to proceed. Pre-cath labs today.   2. HTN: BP well controlled. No changes.   3. Hyperlipidemia:  Lipids well controlled. He takes Zocor every other day.   4. Mitral regurgitation: Mild by echo August 2015.    5. Acute on chronic diastolic CHF: Weight is stable. No changes.   Current medicines are reviewed at length with the patient today.  The patient does not have concerns regarding medicines.  The following changes have been made:  no change  Labs/ tests ordered today include:  No orders of the defined types were placed in this encounter.    Disposition:   FU with me after cath   Signed, Lauree Chandler, MD 05/19/2015 8:45 AM    [address], [place], [place]  [address] Phone: [phone]; Fax: [phone]

## 2015-05-19 NOTE — Patient Instructions (Signed)
Medication Instructions:  Your physician recommends that you continue on your current medications as directed. Please refer to the Current Medication list given to you today.   Labwork: Lab work to be done today--BMP, CBC, PT  Testing/Procedures: Your physician has requested that you have a cardiac catheterization. Cardiac catheterization is used to diagnose and/or treat various heart conditions. Doctors may recommend this procedure for a number of different reasons. The most common reason is to evaluate chest pain. Chest pain can be a symptom of coronary artery disease (CAD), and cardiac catheterization can show whether plaque is narrowing or blocking your heart's arteries. This procedure is also used to evaluate the valves, as well as measure the blood flow and oxygen levels in different parts of your heart. For further information please visit [email or web address]. Please follow instruction sheet, as given. Scheduled for May 27, 2015    Follow-Up: Your physician recommends that you schedule a follow-up appointment WU:[id number]:about 5 weeks with NP or PA. Scheduled for June 24, 2015 at 8:30 with Tereso NewcomerScott Weaver, PA   Any Other Special Instructions Will Be Listed Below (If Applicable).

## 2015-05-22 ENCOUNTER — Other Ambulatory Visit: Payer: Self-pay | Admitting: Family Medicine

## 2015-05-27 ENCOUNTER — Encounter (HOSPITAL_COMMUNITY)
Admission: RE | Disposition: A | Discharge status: Home / Self Care | Payer: Self-pay | Source: Ambulatory Visit | Attending: Cardiovascular Disease

## 2015-05-27 ENCOUNTER — Ambulatory Visit (HOSPITAL_COMMUNITY)
Admission: RE | Admit: 2015-05-27 | Discharge: 2015-05-27 | Disposition: A | Discharge status: Home / Self Care | Payer: Medicare HMO | Source: Ambulatory Visit | Attending: Cardiovascular Disease | Admitting: Cardiovascular Disease

## 2015-05-27 ENCOUNTER — Encounter (HOSPITAL_COMMUNITY): Payer: Self-pay | Admitting: Cardiovascular Disease

## 2015-05-27 DIAGNOSIS — I509 Heart failure, unspecified: Secondary | ICD-10-CM | POA: Diagnosis not present

## 2015-05-27 DIAGNOSIS — I2582 Chronic total occlusion of coronary artery: Secondary | ICD-10-CM | POA: Diagnosis not present

## 2015-05-27 DIAGNOSIS — I2510 Atherosclerotic heart disease of native coronary artery without angina pectoris: Secondary | ICD-10-CM | POA: Diagnosis present

## 2015-05-27 DIAGNOSIS — Z955 Presence of coronary angioplasty implant and graft: Secondary | ICD-10-CM | POA: Insufficient documentation

## 2015-05-27 DIAGNOSIS — I340 Nonrheumatic mitral (valve) insufficiency: Secondary | ICD-10-CM | POA: Insufficient documentation

## 2015-05-27 DIAGNOSIS — I5032 Chronic diastolic (congestive) heart failure: Secondary | ICD-10-CM | POA: Diagnosis not present

## 2015-05-27 DIAGNOSIS — I252 Old myocardial infarction: Secondary | ICD-10-CM | POA: Insufficient documentation

## 2015-05-27 DIAGNOSIS — I25110 Atherosclerotic heart disease of native coronary artery with unstable angina pectoris: Secondary | ICD-10-CM | POA: Diagnosis not present

## 2015-05-27 DIAGNOSIS — K219 Gastro-esophageal reflux disease without esophagitis: Secondary | ICD-10-CM | POA: Diagnosis not present

## 2015-05-27 DIAGNOSIS — Z7982 Long term (current) use of aspirin: Secondary | ICD-10-CM | POA: Diagnosis not present

## 2015-05-27 DIAGNOSIS — Z87891 Personal history of nicotine dependence: Secondary | ICD-10-CM | POA: Insufficient documentation

## 2015-05-27 DIAGNOSIS — I10 Essential (primary) hypertension: Secondary | ICD-10-CM | POA: Insufficient documentation

## 2015-05-27 DIAGNOSIS — I25710 Atherosclerosis of autologous vein coronary artery bypass graft(s) with unstable angina pectoris: Secondary | ICD-10-CM | POA: Diagnosis not present

## 2015-05-27 DIAGNOSIS — E785 Hyperlipidemia, unspecified: Secondary | ICD-10-CM | POA: Diagnosis not present

## 2015-05-27 DIAGNOSIS — Z7902 Long term (current) use of antithrombotics/antiplatelets: Secondary | ICD-10-CM | POA: Insufficient documentation

## 2015-05-27 HISTORY — PX: CARDIAC CATHETERIZATION: SHX172

## 2015-05-27 SURGERY — LEFT HEART CATH AND CORONARY ANGIOGRAPHY

## 2015-05-27 MED ORDER — IOHEXOL 350 MG/ML SOLN
INTRAVENOUS | Status: DC | PRN
  Administered 2015-05-27: 125 mL via INTRA_ARTERIAL

## 2015-05-27 MED ORDER — ASPIRIN 81 MG PO CHEW
81.0000 mg | CHEWABLE_TABLET | ORAL | Status: AC
  Administered 2015-05-27: 81 mg via ORAL

## 2015-05-27 MED ORDER — ASPIRIN 81 MG PO CHEW
CHEWABLE_TABLET | ORAL | Status: AC
  Filled 2015-05-27: qty 1

## 2015-05-27 MED ORDER — SODIUM CHLORIDE 0.9 % IJ SOLN: 3.0000 mL | INTRAMUSCULAR | Status: DC | PRN

## 2015-05-27 MED ORDER — SODIUM CHLORIDE 0.9 % IV SOLN: INTRAVENOUS | Status: AC

## 2015-05-27 MED ORDER — HEPARIN (PORCINE) IN NACL 2-0.9 UNIT/ML-% IJ SOLN
INTRAMUSCULAR | Status: AC
  Filled 2015-05-27: qty 1000

## 2015-05-27 MED ORDER — LIDOCAINE HCL (PF) 1 % IJ SOLN
INTRAMUSCULAR | Status: DC | PRN
  Administered 2015-05-27: 10:00:00

## 2015-05-27 MED ORDER — SODIUM CHLORIDE 0.9 % IV SOLN: INTRAVENOUS | Status: DC

## 2015-05-27 MED ORDER — LIDOCAINE HCL (PF) 1 % IJ SOLN
INTRAMUSCULAR | Status: AC
  Filled 2015-05-27: qty 30

## 2015-05-27 MED ORDER — SODIUM CHLORIDE 0.9 % IJ SOLN: 3.0000 mL | Freq: Two times a day (BID) | INTRAMUSCULAR | Status: DC

## 2015-05-27 MED ORDER — MIDAZOLAM HCL 2 MG/2ML IJ SOLN
INTRAMUSCULAR | Status: DC | PRN
  Administered 2015-05-27: 2 mg via INTRAVENOUS

## 2015-05-27 MED ORDER — MIDAZOLAM HCL 2 MG/2ML IJ SOLN
INTRAMUSCULAR | Status: AC
  Filled 2015-05-27: qty 2

## 2015-05-27 MED ORDER — SODIUM CHLORIDE 0.9 % IV SOLN: 250.0000 mL | INTRAVENOUS | Status: DC | PRN

## 2015-05-27 MED ORDER — FENTANYL CITRATE (PF) 100 MCG/2ML IJ SOLN
INTRAMUSCULAR | Status: AC
  Filled 2015-05-27: qty 2

## 2015-05-27 MED ORDER — FENTANYL CITRATE (PF) 100 MCG/2ML IJ SOLN
INTRAMUSCULAR | Status: DC | PRN
  Administered 2015-05-27: 50 ug via INTRAVENOUS

## 2015-05-27 SURGICAL SUPPLY — 15 items
CATH INFINITI 5 FR JL3.5 (CATHETERS) IMPLANT
CATH INFINITI 5 FR RCB (CATHETERS) ×2 IMPLANT
CATH INFINITI 5FR ANG PIGTAIL (CATHETERS) IMPLANT
CATH INFINITI 5FR MULTPACK ANG (CATHETERS) ×2 IMPLANT
CATH INFINITI JR4 5F (CATHETERS) IMPLANT
DEVICE RAD COMP TR BAND LRG (VASCULAR PRODUCTS) IMPLANT
GLIDESHEATH SLEND SS 6F .021 (SHEATH) IMPLANT
KIT HEART LEFT (KITS) ×2 IMPLANT
PACK CARDIAC CATHETERIZATION (CUSTOM PROCEDURE TRAY) ×2 IMPLANT
SHEATH PINNACLE 5F 10CM (SHEATH) ×2 IMPLANT
SYR MEDRAD MARK V 150ML (SYRINGE) ×2 IMPLANT
TRANSDUCER W/STOPCOCK (MISCELLANEOUS) ×2 IMPLANT
TUBING CIL FLEX 10 FLL-RA (TUBING) IMPLANT
WIRE EMERALD 3MM-J .035X150CM (WIRE) ×2 IMPLANT
WIRE SAFE-T 1.5MM-J .035X260CM (WIRE) IMPLANT

## 2015-05-27 NOTE — Interval H&P Note (Signed)
History and Physical Interval Note:  05/27/2015 9:09 AM  Frank [name]  has presented today for cardiac cath with the diagnosis of unstable angina.  The various methods of treatment have been discussed with the patient and family. After consideration of risks, benefits and other options for treatment, the patient has consented to  Procedure(s): Left Heart Cath and Coronary Angiography (N/A) as a surgical intervention .  The patient's history has been reviewed, patient examined, no change in status, stable for surgery.  I have reviewed the patient's chart and labs.  Questions were answered to the patient's satisfaction.    Cath Lab Visit (complete for each Cath Lab visit)  Clinical Evaluation Leading to the Procedure:   ACS: No.  Non-ACS:    Anginal Classification: CCS III  Anti-ischemic medical therapy: Minimal Therapy (1 class of medications)  Non-Invasive Test Results: No non-invasive testing performed  Prior CABG: Previous CABG         Lucie Friedlander

## 2015-05-27 NOTE — Discharge Instructions (Signed)
Angiogram °An angiogram, also called angiography, is a procedure used to look at the blood vessels that carry blood to different parts of your body (arteries). In this procedure, dye is injected through a long, thin tube (catheter) into an artery. X-rays are then taken. The X-rays will show if there is a blockage or problem in a blood vessel.  °LET YOUR HEALTH CARE PROVIDER KNOW ABOUT: °· Any allergies you have, including allergies to shellfish or contrast dye.   °· All medicines you are taking, including vitamins, herbs, eye drops, creams, and over-the-counter medicines.   °· Previous problems you or members of your family have had with the use of anesthetics.   °· Any blood disorders you have.   °· Previous surgeries you have had. °· Any previous kidney problems or failure you have had.  °· Medical conditions you have.   °· Possibility of pregnancy, if this applies. °RISKS AND COMPLICATIONS °Generally, an angiogram is a safe procedure. However, as with any procedure, problems can occur. Possible problems include: °· Injury to the blood vessels, including rupture or bleeding. °· Infection or bruising at the catheter site. °· Allergic reaction to the dye or contrast used. °· Kidney damage from the dye or contrast used. °· Blood clots that can lead to a stroke or heart attack. °BEFORE THE PROCEDURE °· Do not eat or drink after midnight on the night before the procedure, or as directed by your health care provider.   °· Ask your health care provider if you may drink enough water to take any needed medicines the morning of the procedure.   °PROCEDURE °· You may be given a medicine to help you relax (sedative) before and during the procedure. This medicine is given through an IV access tube that is inserted into one of your veins.   °· The area where the catheter will be inserted will be washed and shaved. This is usually done in the groin but may be done in the fold of your arm (near your elbow) or in the wrist. °· A  medicine will be given to numb the area where the catheter will be inserted (local anesthetic). °· The catheter will be inserted with a guide wire into an artery. The catheter is guided by using a type of X-ray (fluoroscopy) to the blood vessel being examined.   °· Dye is then injected into the catheter, and X-rays are taken. The dye helps to show where any narrowing or blockages are located.   °AFTER THE PROCEDURE  °· If the procedure is done through the leg, you will be kept in bed lying flat for several hours. You will be instructed to not bend or cross your legs. °· The insertion site will be checked frequently. °· The pulse in your feet or wrist will be checked frequently. °· Additional blood tests, X-rays, and electrocardiography may be done.   °· You may need to stay in the hospital overnight for observation.   °Document Released: 08/15/2005 Document Revised: 11/10/2013 Document Reviewed: 04/08/2013 °Exit[employer]® Patient Information ©2015 [employer]. This information is not intended to replace advice given to you by your health care provider. Make sure you discuss any questions you have with your health care provider. ° °

## 2015-05-27 NOTE — Progress Notes (Signed)
Site area: rt groin fa sheath Site Prior to Removal:  Level 0 Pressure Applied For:  20 minutes Manual:   yes Patient Status During Pull:  stable Post Pull Site:  Level  0 Post Pull Instructions Given:  yes Post Pull Pulses Present: yes Dressing Applied:  tegaderm Bedrest begins @ 1025 Comments:  none

## 2015-05-27 NOTE — H&P (View-Only) (Signed)
Chief Complaint  Patient presents with  . Chest Pain     History of Present Illness: 59 yo male with a history of CAD s/p CABG 2004 and stenting in 2011, HLD, HTN, GERD and mitral regurgitation who is here today for cardiac follow up. He had been followed by [facility]. I met him in May 2013 for the first time. Echo 01/31/12 with normal LV size and function with at least moderate MR. He told me at the first visit that he felt fatigued and had SOB. He had no LE edema, orthopnea or chest pain. I arranged a TEE on 03/27/12 to evaluate his MR and this showed mild to moderate MR. At his f/u visit 05/06/12, he had c/o exertional chest pressure, dyspnea and fatigue. I arranged a cardiac cath on 05/20/12. There was severe disease in the SVG to the PDA and 2 DES were placed in the SVG to the PDA. The LIMA was patent but the vein grafts to the OM, Diagonal and PLA were occluded. He had recurrence of angina December 2014. Repeat cardiac cath 10/30/13 with patent LIMA to LAD, patent SVG to PDA with severe disease. Unchanged disease in native vessels as below. 2 drug eluting stents were placed in the SVG to the PDA. Echo August 2015 with normal LV function, mild MR. I last saw him in February 2016 and he was feeling great.   He is added onto my schedule today after he was seen in primary care yesterday and c/o exertional dyspnea and burning type chest pain.   Primary Care Physician: [facility]  Past Medical History  Diagnosis Date  . Mitral regurgitation     Moderate by TEE 03/27/12  . Low HDL (under 40)   . GERD (gastroesophageal reflux disease)   . Hypertension   . CAD (coronary artery disease)     5V CABG in 2004; stent to RCA x1 and SVG to acute marginal x2 2011;  PTCA/DES x 2 body of SVG to PDA  05/20/12  . CHF (congestive heart failure)      EF 55% by echo 03/27/2012  . Depression   . Numbness and tingling in hands   . Numbness and tingling of both legs   . Umbilical hernia    unrepaired (05/20/12)  . Arthritis     hands, shoulders, arms  . Chronic lower back pain     "w/activity"  . Anxiety   . HLD (hyperlipidemia)   . Myocardial infarction 2004  . Shortness of breath     ' WHEN MY HEART ACTS UP "    Past Surgical History  Procedure Laterality Date  . Tee without cardioversion  03/27/2012    Procedure: TRANSESOPHAGEAL ECHOCARDIOGRAM (TEE);  Surgeon: Larey Dresser, MD;  Location: [facility] ENDOSCOPY;  Service: Cardiovascular;  Laterality: N/A;  to be done at 1100  . Coronary artery bypass graft  2004    5v CABG   . Incision and drainage of wound  2010    "from bite; maybe snake or spider; almost lost LLE"  . Ptca  10/30/2013    DES  SVG    . Coronary angioplasty with stent placement  05/11    stent to RCA x 1 and SVG-acute marginal x2. EF normal  . Coronary angioplasty with stent placement  05/20/12    PTCA/DES x 2 body of SVG to PDA   . Left heart catheterization with coronary/graft angiogram N/A 05/20/2012    Procedure: LEFT HEART CATHETERIZATION WITH CORONARY/GRAFT  ANGIOGRAM;  Surgeon: Burnell Blanks, MD;  Location: [facility] CATH LAB;  Service: Cardiovascular;  Laterality: N/A;  . Left heart catheterization with coronary/graft angiogram N/A 10/30/2013    Procedure: LEFT HEART CATHETERIZATION WITH Beatrix Fetters;  Surgeon: Burnell Blanks, MD;  Location: [facility] CATH LAB;  Service: Cardiovascular;  Laterality: N/A;  . Percutaneous coronary stent intervention (pci-s)  10/30/2013    Procedure: PERCUTANEOUS CORONARY STENT INTERVENTION (PCI-S);  Surgeon: Burnell Blanks, MD;  Location: [facility] CATH LAB;  Service: Cardiovascular;;    Current Outpatient Prescriptions  Medication Sig Dispense Refill  . aspirin EC 81 MG tablet Take 81 mg by mouth daily.    . cetirizine (ZYRTEC) 10 MG tablet Take 1 tablet (10 mg total) by mouth daily. 30 tablet 11  . citalopram (CELEXA) 40 MG tablet Take 1 tablet (40 mg total) by mouth daily. 90 tablet 3  . clopidogrel  (PLAVIX) 75 MG tablet Take 1 tablet (75 mg total) by mouth daily. 90 tablet 3  . furosemide (LASIX) 40 MG tablet Take 1 tablet (40 mg total) by mouth daily. 90 tablet 3  . ibuprofen (ADVIL,MOTRIN) 200 MG tablet Take 600 mg by mouth every 6 (six) hours as needed. pain    . losartan (COZAAR) 50 MG tablet Take 1 tablet (50 mg total) by mouth daily. 90 tablet 3  . metoprolol tartrate (LOPRESSOR) 25 MG tablet Take 0.5 tablets (12.5 mg total) by mouth 2 (two) times daily. 90 tablet 3  . mometasone (NASONEX) 50 MCG/ACT nasal spray Place 2 sprays into the nose daily. 17 g 12  . Multiple Vitamin (MULTIVITAMIN WITH MINERALS) TABS tablet Take 1 tablet by mouth daily.    . pantoprazole (PROTONIX) 40 MG tablet Take 1 tablet (40 mg total) by mouth daily. 90 tablet 3  . potassium chloride (K-DUR,KLOR-CON) 20 MEQ tablet Take 1 tablet (20 mEq total) by mouth daily. 90 tablet 3  . simvastatin (ZOCOR) 40 MG tablet Take one tablet by mouth every other day. 45 tablet 11  . tetrahydrozoline-zinc (VISINE-AC) 0.05-0.25 % ophthalmic solution Place 2 drops into both eyes 3 (three) times daily as needed (itching/burning).    . nitroGLYCERIN (NITROSTAT) 0.4 MG SL tablet Place 1 tablet (0.4 mg total) under the tongue every 5 (five) minutes as needed for chest pain. 25 tablet 6   No current facility-administered medications for this visit.    Allergies  Allergen Reactions  . Sulfa Antibiotics Rash  . Hydrocodone Hives and Itching    History   Social History  . Marital Status: Married    Spouse Name: N/A  . Number of Children: 3  . Years of Education: N/A   Occupational History  . 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 Smoking status: Former Smoker -- 3.00 packs/day for 40 years    Types: Cigarettes    Quit date: 01/23/2003  . Smokeless tobacco: Never Used  . 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 Drug Use: No     Comment: 05/20/12  "last marijuana was 2004"  . Sexual Activity: Yes   Other Topics Concern  . Not on file   Social History Narrative    Family History  Problem Relation Age of Onset  . Heart disease Mother   . Cancer Mother     ? type  . Diabetes Father   . Hyperlipidemia Father   . Hypertension Father   .  Stomach cancer Father   . Heart disease Brother 27    X 5 brothers    Review of Systems:  As stated in the HPI and otherwise negative.   BP 106/70 mmHg  Pulse 53  Ht _0  (1.702 m)  Wt 179 lb 1.9 oz (81.248 kg)  BMI 28.05 kg/m2  SpO2 94%  Physical Examination: General: Well developed, well nourished, NAD HEENT: OP clear, mucus membranes moist SKIN: warm, dry. No rashes. Neuro: No focal deficits Musculoskeletal: Muscle strength 5/5 all ext Psychiatric: Mood and affect normal Neck: No JVD, no carotid bruits, no thyromegaly, no lymphadenopathy. Lungs:Clear bilaterally, no wheezes, rhonci, crackles Cardiovascular: Regular rate and rhythm. No murmurs, gallops or rubs. Abdomen:Soft. Bowel sounds present. Non-tender.  Extremities: No lower extremity edema. Pulses are 2 + in the bilateral DP/PT.  Cardiac cath 10/30/13: Left main: No obstructive disease noted.  Left Anterior Descending Artery: Moderate sized vessel that courses to the apex. There is diffuse proximal 80% stenosis and 100% mid occlusion. The mid and distal vessel fills from the patent LIMA graft.  Circumflex Artery: Moderate sized vessel with mild plaque. The very small caliber OM branch has 80% ostial stenosis. The Ramus intermediate branch is moderate in caliber with proximal 50% stenosis which appears unchanged from last cath.  Right Coronary Artery: Small to moderate caliber vessel with diffuse plaque. There is a patent stent mid vessel with 40-50% in-stent restenosis, unchanged from last cath. The PDA is occluded and fills from the graft. The posterolateral segment fills antegrade. There appears to be a 99% stenosis  leading into the small PL segment at the site of the old graft anastamosis at the suture line. This vessel is small in caliber, 1.75 mm in diameter.  Graft Anatomy:  SVG to PDA is patent with stents present in the proximal/mid and distal body of vein graft. There is 70-80% stenosis in the ostium of the vein graft extending down into the stented segment. The mid stented segment has diffuse 60-70% restenosis. The distal stented segment has 90% restenosis. Just beyond the distal stent there is a 60-70% stenosis. The SVG limb to the PLA is known to be occluded.  SVG to OM is known to be occluded and was not selectively engaged.  SVG to Diagonal is known to be occluded and was not selectively engaged.  LIMA to LAD was patent.  Left Ventricular Angiogram: LVEF 55% with moderate MR  Echo August 2015: Left ventricle: The cavity size was normal. Systolic function was normal. The estimated ejection fraction was in the range of 55% to 60%. Wall motion was normal; there were no regional wall motion abnormalities. Doppler parameters are consistent with abnormal left ventricular relaxation (grade 1 diastolic dysfunction). There was no evidence of elevated ventricular filling pressure by Doppler parameters. - Aortic valve: Trileaflet; normal thickness leaflets. There was no regurgitation. - Aortic root: The aortic root was normal in size. - Mitral valve: Thickened anterior leaflet of the mitral valve with mild prolapse. There was mild regurgitation directed posteriorly. - Left atrium: The atrium was mildly dilated. - Right ventricle: Systolic function was normal. - Right atrium: The atrium was normal in size. - Tricuspid valve: There was no regurgitation. - Pericardium, extracardiac: There was no pericardial effusion. Impressions: - Normal biventricular size and systolic function. Mild prolapse of the anterior leaflet of the mitral valve with mild mitral regurgitation with  posteriorly directed jet.  EKG:  EKG is ordered today. The ekg ordered today demonstrates sinus brady, rate 53  bpm. 1st degree AV block. Old inferior MI  Recent Labs: 10/27/2014: ALT 24; Hemoglobin 13.9; Platelets 206 12/22/2014: BUN 16; Creatinine, Ser 1.16; Potassium 4.3; Sodium 139   Lipid Panel    Component Value Date/Time   CHOL 140 07/08/2014 0909   TRIG 184* 07/08/2014 0909   HDL 36* 07/08/2014 0909   CHOLHDL 3.9 07/08/2014 0909   VLDL 37 07/08/2014 0909   LDLCALC 67 07/08/2014 0909     Wt Readings from Last 3 Encounters:  05/19/15 179 lb 1.9 oz (81.248 kg)  05/18/15 178 lb 8 oz (80.967 kg)  12/22/14 186 lb 12.8 oz (84.732 kg)     Other studies Reviewed: Additional studies/ records that were reviewed today include: . Review of the above records demonstrates:    Assessment and Plan:   1. CAD/Unstable angina: He is having exertional chest pain and dyspnea c/w unstable angina. Will continue ASA and Plavix. Continue other cardiac meds. Will arrange left heart cath with possible PCI on July 8th, 2016. Risks and benefits reviewed. He agrees to proceed. Pre-cath labs today.   2. HTN: BP well controlled. No changes.   3. Hyperlipidemia:  Lipids well controlled. He takes Zocor every other day.   4. Mitral regurgitation: Mild by echo August 2015.    5. Acute on chronic diastolic CHF: Weight is stable. No changes.   Current medicines are reviewed at length with the patient today.  The patient does not have concerns regarding medicines.  The following changes have been made:  no change  Labs/ tests ordered today include:  No orders of the defined types were placed in this encounter.    Disposition:   FU with me after cath   Signed, Lauree Chandler, MD 05/19/2015 8:45 AM    [place], [place], [place]  [address] Phone: [phone]; Fax: [phone]

## 2015-06-06 ENCOUNTER — Other Ambulatory Visit: Payer: Self-pay | Admitting: Cardiovascular Disease

## 2015-06-23 NOTE — Progress Notes (Signed)
Cardiology Office Note   Date:  06/24/2015   ID:  Frank [name], Frank [name] 07-12-1956, MRN [id number]  PCP:  Mickie Hillier, MD  Cardiologist:  Dr. Verne Carrow   Electrophysiologist:  n/a  Chief Complaint  Patient presents with  . Coronary Artery Disease     History of Present Illness: Frank [name] is a 59 y.o. male with a hx of CAD s/p CABG 2004 and stenting in 2011, HLD, HTN, GERD and mitral regurgitation.  Previously followed by [facility] and established with Dr. Verne Carrow in May 2013. Echo 01/31/12 with normal LV size and function with at least moderate MR.  TEE 03/27/12 showed mild to moderate MR.  LHC 05/20/12 demonstrated severe disease in the SVG to the PDA and 2 DES were placed in the SVG to the PDA. The LIMA was patent but the vein grafts to the OM, Diagonal and PLA were occluded. He had recurrence of angina December 2014. Repeat cardiac cath 10/30/13 with patent LIMA to LAD, patent SVG to PDA with severe disease. Unchanged disease in native vessels as below. 2 drug eluting stents were placed in the SVG to the PDA. Echo August 2015 with normal LV function, mild MR.  Patient was recently seen by Dr. Clifton James 05/19/15. He complained of exertional dyspnea and chest pain. Cardiac catheterization was arranged. This demonstrated severe 3 vessel CAD and 1 out of 4 patent bypass grafts. SVG-PDA was occluded since his last cardiac catheterization. There was severe stenosis in the small caliber posterior lateral branch at the site of the insertion of the vein graft. PCI was attempted 2014 but was not successful. Continued medical therapy was recommended. He returns for follow-up.  Here today with his wife.  I have seen her mother in our office before.  He continues to have exertional chest burning and dyspnea.  He denies rest pain.  He has does have assoc radiation to his arms.  Symptoms are CCS class 2-3. He denies orthopnea, PND, edema.  No syncope.     Studies/Reports Reviewed  Today:  LHC 05/27/15 LAD:  prox 100% RI:  50% LCx:  OM2 80% RCA:  prox 30%, dist 90% Free L-LAD: ok S-D1:  CTO S-OM2:  CTO S-RPDA:  CTO 1. 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 The vein graft to the PDA is occluded since last cath.  2. Occluded proximal LAD with filling of the mid and distal vessel by the patent LIMA graft. Occluded SVG to Diagonal.  3. Moderate disease in the moderate caliber intermediate branch, unchanged from last cath. Occluded SVG to OM 4. Mild disease in the AV groove Circumflex 5. Severe stenosis in the small caliber posterolateral branch at the site of the insertion of the vein graft. PCI of this vessel was attempted in 2014 but was not successful. SVG to PD A is now occluded.  6. Normal LV systolic function 7. Moderate mitral regurgitation Recommendations: Will continue medical management of CAD  Echo 06/2014 EF 55-60%, normal wall motion, grade 1 diastolic dysfunction, MVP with mild MR directed posteriorly, mild LAE, normal RV function   Past Medical History  Diagnosis Date  . Mitral regurgitation     Moderate by TEE 03/27/12  . Low HDL (under 40)   . GERD (gastroesophageal reflux disease)   . Hypertension   . CAD (coronary artery disease)     5V CABG in 2004; stent to RCA x1 and SVG to acute marginal x2 2011;  PTCA/DES x  2 body of SVG to PDA  05/20/12  . CHF (congestive heart failure)      EF 55% by echo 03/27/2012  . Depression   . Numbness and tingling in hands   . Numbness and tingling of both legs   . Umbilical hernia     unrepaired (05/20/12)  . Arthritis     hands, shoulders, arms  . Chronic lower back pain     "w/activity"  . Anxiety   . HLD (hyperlipidemia)   . Myocardial infarction 2004    Past Surgical History  Procedure Laterality Date  . Tee without cardioversion  03/27/2012    Procedure: TRANSESOPHAGEAL ECHOCARDIOGRAM (TEE);  Surgeon: Laurey Morale, MD;  Location: [facility] ENDOSCOPY;  Service:  Cardiovascular;  Laterality: N/A;  to be done at 1100  . Coronary artery bypass graft  2004    5v CABG   . Incision and drainage of wound  2010    "from bite; maybe snake or spider; almost lost LLE"  . Ptca  10/30/2013    DES  SVG    . Coronary angioplasty with stent placement  05/11    stent to RCA x 1 and SVG-acute marginal x2. EF normal  . Coronary angioplasty with stent placement  05/20/12    PTCA/DES x 2 body of SVG to PDA   . Left heart catheterization with coronary/graft angiogram N/A 05/20/2012    Procedure: LEFT HEART CATHETERIZATION WITH Isabel Caprice;  Surgeon: Kathleene Hazel, MD;  Location: [facility] CATH LAB;  Service: Cardiovascular;  Laterality: N/A;  . Left heart catheterization with coronary/graft angiogram N/A 10/30/2013    Procedure: LEFT HEART CATHETERIZATION WITH Isabel Caprice;  Surgeon: Kathleene Hazel, MD;  Location: [facility] CATH LAB;  Service: Cardiovascular;  Laterality: N/A;  . Percutaneous coronary stent intervention (pci-s)  10/30/2013    Procedure: PERCUTANEOUS CORONARY STENT INTERVENTION (PCI-S);  Surgeon: Kathleene Hazel, MD;  Location: [facility] CATH LAB;  Service: Cardiovascular;;  . Cardiac catheterization N/A 05/27/2015    Procedure: Left Heart Cath and Coronary Angiography;  Surgeon: Kathleene Hazel, MD;  Location: [facility] INVASIVE CV LAB;  Service: Cardiovascular;  Laterality: N/A;     Current Outpatient Prescriptions  Medication Sig Dispense Refill  . aspirin EC 81 MG tablet Take 81 mg by mouth daily.    . citalopram (CELEXA) 40 MG tablet Take 1 tablet (40 mg total) by mouth daily. 90 tablet 3  . clopidogrel (PLAVIX) 75 MG tablet TAKE 1 TABLET BY MOUTH ONCE A DAY. 90 tablet 3  . furosemide (LASIX) 40 MG tablet Take 1 tablet (40 mg total) by mouth daily. 90 tablet 3  . ibuprofen (ADVIL,MOTRIN) 200 MG tablet Take 600 mg by mouth every 6 (six) hours as needed. pain    . losartan (COZAAR) 50 MG tablet Take 1 tablet (50 mg total) by  mouth daily. 90 tablet 3  . mometasone (NASONEX) 50 MCG/ACT nasal spray Place 2 sprays into the nose daily. (Patient taking differently: Place 2 sprays into the nose daily as needed (allergies). ) 17 g 12  . Multiple Vitamin (MULTIVITAMIN WITH MINERALS) TABS tablet Take 1 tablet by mouth daily.    Marland Kitchen NITROSTAT 0.4 MG SL tablet DISSOLVE 1 TABLET UNDER TONGUE EVERY 5 MINUTES UP TO 15 MIN FOR CHESTPAIN. IF NO RELIEF CALL 911. 25 tablet 6  . pantoprazole (PROTONIX) 40 MG tablet Take 1 tablet (40 mg total) by mouth daily. 90 tablet 3  . potassium chloride (K-DUR,KLOR-CON) 20 MEQ tablet Take 1  tablet (20 mEq total) by mouth daily. 90 tablet 3  . simvastatin (ZOCOR) 40 MG tablet Take one tablet on Mondays, Wednesdays, and Fridays 45 tablet 11  . tetrahydrozoline-zinc (VISINE-AC) 0.05-0.25 % ophthalmic solution Place 2 drops into both eyes 3 (three) times daily as needed (itching/burning).    . metoprolol succinate (TOPROL-XL) 25 MG 24 hr tablet Take 1 tablet (25 mg total) by mouth daily. Take with or immediately following a meal. 30 tablet 3  . ranolazine (RANEXA) 500 MG 12 hr tablet Take 1 tablet (500 mg total) by mouth 2 (two) times daily. 45 tablet 3   No current facility-administered medications for this visit.    Allergies:   Sulfa antibiotics and Hydrocodone    Social History:  The patient  reports that he quit smoking about 12 years ago. His smoking use included Cigarettes. He has a 120 pack-year smoking history. He has never used smokeless tobacco. He reports that he does not drink alcohol or use illicit drugs.   Family History:  The patient's family history includes Cancer in his mother; Diabetes in his father; Heart disease in his mother; Heart disease (age of onset: 22) in his brother; Hyperlipidemia in his father; Hypertension in his father; Stomach cancer in his father.    ROS:   Please see the history of present illness.   Review of Systems  Constitution: Positive for malaise/fatigue.   Cardiovascular: Positive for chest pain and dyspnea on exertion.  Neurological: Positive for dizziness.  All other systems reviewed and are negative.     PHYSICAL EXAM: VS:  BP 112/74 mmHg  Pulse 52  Ht 5\' 6"  (1.676 m)  Wt 179 lb 12.8 oz (81.557 kg)  BMI 29.03 kg/m2  SpO2 96%    Wt Readings from Last 3 Encounters:  06/24/15 179 lb 12.8 oz (81.557 kg)  05/27/15 179 lb (81.194 kg)  05/19/15 179 lb 1.9 oz (81.248 kg)     GEN: Well nourished, well developed, in no acute distress HEENT: normal Neck: no JVD,  no masses Cardiac:  Normal S1/S2, RRR; 2/6 systolic murmur LLSB,  no rubs or gallops, no edema;  R groin without hematoma or bruit  Respiratory:  clear to auscultation bilaterally, no wheezing, rhonchi or rales. GI: soft, nontender, nondistended, + BS MS: no deformity or atrophy Skin: warm and dry  Neuro:  CNs II-XII intact, Strength and sensation are intact Psych: Normal affect   EKG:  EKG is ordered today.  It demonstrates:   Sinus bradycardia, HR 52, normal axis, first-degree AV block (PR 222 ms), no change from prior tracings   Recent Labs: 10/27/2014: ALT 24 05/19/2015: BUN 16; Creatinine, Ser 1.15; Hemoglobin 15.5; Platelets 212.0; Potassium 4.2; Sodium 139    Lipid Panel    Component Value Date/Time   CHOL 140 07/08/2014 0909   TRIG 184* 07/08/2014 0909   HDL 36* 07/08/2014 0909   CHOLHDL 3.9 07/08/2014 0909   VLDL 37 07/08/2014 0909   LDLCALC 67 07/08/2014 0909      ASSESSMENT AND PLAN:  Coronary artery disease involving native coronary artery of native heart with angina pectoris:  He has stable exertional angina.  LHC with 1/4 grafts patent and no options for revascularization.  Will continue ASA, Plavix, beta-blocker, statin.  BP will not likely tolerate the addition of nitrates or Amlodipine.  He has SEs to Imdur in the past with HAs.  Will try Ranolazine 500 mg Twice daily. He is having trouble breaking Metoprolol Tartrate in 1/2.  DC  Lopressor and  start Toprol-XL 25 mg QD.    Essential hypertension:  Controlled.   Hyperlipidemia:  Continue statin.  Decrease Zocor to 20 mg M, W, F with addition of Ranolazine.    Mitral regurgitation:  Mild by recent Echo.    Chronic diastolic CHF (congestive heart failure):  Volume appears stable. Continue current therapy.      Medication Changes: Current medicines are reviewed at length with the patient today.  Concerns regarding medicines are as outlined above.  The following changes have been made:   Discontinued Medications   CETIRIZINE (ZYRTEC) 10 MG TABLET    Take 1 tablet (10 mg total) by mouth daily.   METOPROLOL TARTRATE (LOPRESSOR) 25 MG TABLET    Take 0.5 tablets (12.5 mg total) by mouth 2 (two) times daily.   Modified Medications   Modified Medication Previous Medication   SIMVASTATIN (ZOCOR) 40 MG TABLET simvastatin (ZOCOR) 40 MG tablet      Take one tablet on Mondays, Wednesdays, and Fridays    Take one tablet by mouth every other day.   New Prescriptions   METOPROLOL SUCCINATE (TOPROL-XL) 25 MG 24 HR TABLET    Take 1 tablet (25 mg total) by mouth daily. Take with or immediately following a meal.   RANOLAZINE (RANEXA) 500 MG 12 HR TABLET    Take 1 tablet (500 mg total) by mouth 2 (two) times daily.    Labs/ tests ordered today include:   Orders Placed This Encounter  Procedures  . EKG 12-Lead     Disposition:   FU with Dr. Verne Carrow or me 6-8 weeks.    Signed, Brynda Rim, MHS 06/24/2015 8:54 AM    [facility] [address] [place], [place], [place]  [address] Phone: [phone]; Fax: [phone]

## 2015-06-24 ENCOUNTER — Telehealth: Payer: Self-pay | Admitting: *Deleted

## 2015-06-24 ENCOUNTER — Ambulatory Visit (INDEPENDENT_AMBULATORY_CARE_PROVIDER_SITE_OTHER): Payer: Medicare HMO | Admitting: Physician Assistant

## 2015-06-24 ENCOUNTER — Encounter: Payer: Self-pay | Admitting: Physician Assistant

## 2015-06-24 VITALS — BP 112/74 | HR 52 | Ht 66.0 in | Wt 179.8 lb

## 2015-06-24 DIAGNOSIS — I340 Nonrheumatic mitral (valve) insufficiency: Secondary | ICD-10-CM

## 2015-06-24 DIAGNOSIS — I10 Essential (primary) hypertension: Secondary | ICD-10-CM | POA: Diagnosis not present

## 2015-06-24 DIAGNOSIS — I25119 Atherosclerotic heart disease of native coronary artery with unspecified angina pectoris: Secondary | ICD-10-CM | POA: Diagnosis not present

## 2015-06-24 DIAGNOSIS — E785 Hyperlipidemia, unspecified: Secondary | ICD-10-CM | POA: Diagnosis not present

## 2015-06-24 DIAGNOSIS — I5032 Chronic diastolic (congestive) heart failure: Secondary | ICD-10-CM

## 2015-06-24 MED ORDER — METOPROLOL SUCCINATE ER 25 MG PO TB24: 25.0000 mg | ORAL_TABLET | Freq: Every day | ORAL | Status: DC

## 2015-06-24 MED ORDER — SIMVASTATIN 20 MG PO TABS: ORAL_TABLET | ORAL | Status: DC

## 2015-06-24 MED ORDER — RANOLAZINE ER 500 MG PO TB12: 500.0000 mg | ORAL_TABLET | Freq: Two times a day (BID) | ORAL | Status: DC

## 2015-06-24 MED ORDER — SIMVASTATIN 40 MG PO TABS: ORAL_TABLET | ORAL | Status: DC

## 2015-06-24 NOTE — Telephone Encounter (Signed)
Medication samples have been provided to the patient.  Drug name: ranexa   Qty: 56  LOT: [id number]  Exp.Date: 03/2018  Samples left at front desk for patient pick-up.   Lindell Spar 8:47 AM 06/24/2015 \

## 2015-06-24 NOTE — Patient Instructions (Addendum)
**Note De-Identified Seeley Southgate Obfuscation** Medication Instructions:  Stop taking Metoprolol Tartrate and start taking Toprol XL 25 mg once daily,  Ranexa 500 mg twice daily and decrease Zocor to 20 mg on Mondays, Wednesdays and Fridays.  Labwork: None  Testing/Procedures: None  Follow-Up: Your physician recommends that you schedule a follow-up appointment in: 6 to 8 weeks with Dr Clifton James or Tereso Newcomer, PA-c

## 2015-06-24 NOTE — Addendum Note (Signed)
**Note De-Identified Capri Raben Obfuscation** Addended by: Demetrios Loll on: 06/24/2015 03:00 PM   Modules accepted: Orders

## 2015-07-20 ENCOUNTER — Ambulatory Visit: Payer: Medicare HMO | Admitting: Family Medicine

## 2015-08-01 ENCOUNTER — Other Ambulatory Visit: Payer: Self-pay

## 2015-08-03 ENCOUNTER — Ambulatory Visit (INDEPENDENT_AMBULATORY_CARE_PROVIDER_SITE_OTHER): Payer: Medicare HMO | Admitting: Cardiovascular Disease

## 2015-08-03 VITALS — BP 108/68 | HR 74 | Ht 66.0 in | Wt 176.0 lb

## 2015-08-03 DIAGNOSIS — I25119 Atherosclerotic heart disease of native coronary artery with unspecified angina pectoris: Secondary | ICD-10-CM | POA: Diagnosis not present

## 2015-08-03 DIAGNOSIS — E785 Hyperlipidemia, unspecified: Secondary | ICD-10-CM | POA: Diagnosis not present

## 2015-08-03 DIAGNOSIS — I340 Nonrheumatic mitral (valve) insufficiency: Secondary | ICD-10-CM

## 2015-08-03 DIAGNOSIS — I10 Essential (primary) hypertension: Secondary | ICD-10-CM

## 2015-08-03 DIAGNOSIS — I5032 Chronic diastolic (congestive) heart failure: Secondary | ICD-10-CM

## 2015-08-03 NOTE — Progress Notes (Signed)
Chief Complaint  Patient presents with  . Fatigue     History of Present Illness: 59 yo male with a history of CAD s/p CABG 2004 and stenting in 2011, HLD, HTN, GERD and mitral regurgitation who is here today for cardiac follow up. He had been followed by [facility]. I met him in May 2013 for the first time. Echo 01/31/12 with normal LV size and function with at least moderate MR. He told me at the first visit that he felt fatigued and had SOB. He had no LE edema, orthopnea or chest pain. I arranged a TEE on 03/27/12 to evaluate his MR and this showed mild to moderate MR. At his f/u visit 05/06/12, he had c/o exertional chest pressure, dyspnea and fatigue. I arranged a cardiac cath on 05/20/12. There was severe disease in the SVG to the PDA and 2 DES were placed in the SVG to the PDA. The LIMA was patent but the vein grafts to the OM, Diagonal and PLA were occluded. He had recurrence of angina December 2014. Repeat cardiac cath 10/30/13 with patent LIMA to LAD, patent SVG to PDA with severe disease. Unchanged disease in native vessels as below. 2 drug eluting stents were placed in the SVG to the PDA. Echo August 2015 with normal LV function, mild MR. He was seen here in July 2016 with recurrent chest pain. Cardiac cath July 2016 with new occlusion of SVG to PDA. The only patent graft was the LIMA to LAD. No targets for PCI. He was seen here in follow up August 2016 by Richardson Dopp, PA-C and Ranexa started.   He is here today for follow up.  He is feeling better on the Ranexa. No chest pain. He does have constant fatigue. No change in baseline dyspnea with exertion.   Primary Care Physician: [facility]  Past Medical History  Diagnosis Date  . Mitral regurgitation     Moderate by TEE 03/27/12  . Low HDL (under 40)   . GERD (gastroesophageal reflux disease)   . Hypertension   . CAD (coronary artery disease)     5V CABG in 2004; stent to RCA x1 and SVG to acute marginal x2 2011;   PTCA/DES x 2 body of SVG to PDA  05/20/12  . CHF (congestive heart failure)      EF 55% by echo 03/27/2012  . Depression   . Numbness and tingling in hands   . Numbness and tingling of both legs   . Umbilical hernia     unrepaired (05/20/12)  . Arthritis     hands, shoulders, arms  . Chronic lower back pain     "w/activity"  . Anxiety   . HLD (hyperlipidemia)   . Myocardial infarction 2004    Past Surgical History  Procedure Laterality Date  . Tee without cardioversion  03/27/2012    Procedure: TRANSESOPHAGEAL ECHOCARDIOGRAM (TEE);  Surgeon: Larey Dresser, MD;  Location[facility] ENDOSCOPY;  Service: Cardiovascular;  Laterality: N/A;  to be done at 1100  . Coronary artery bypass graft  2004    5v CABG   . Incision and drainage of wound  2010    "from bite; maybe snake or spider; almost lost LLE"  . Ptca  10/30/2013    DES  SVG    . Coronary angioplasty with stent placement  05/11    stent to RCA x 1 and SVG-acute marginal x2. EF normal  . Coronary angioplasty with stent placement  05/20/12  PTCA/DES x 2 body of SVG to PDA   . Left heart catheterization with coronary/graft angiogram N/A 05/20/2012    Procedure: LEFT HEART CATHETERIZATION WITH Beatrix Fetters;  Surgeon: Burnell Blanks, MD;  Location: [facility] CATH LAB;  Service: Cardiovascular;  Laterality: N/A;  . Left heart catheterization with coronary/graft angiogram N/A 10/30/2013    Procedure: LEFT HEART CATHETERIZATION WITH Beatrix Fetters;  Surgeon: Burnell Blanks, MD;  Location: [facility] CATH LAB;  Service: Cardiovascular;  Laterality: N/A;  . Percutaneous coronary stent intervention (pci-s)  10/30/2013    Procedure: PERCUTANEOUS CORONARY STENT INTERVENTION (PCI-S);  Surgeon: Burnell Blanks, MD;  Location: [facility] CATH LAB;  Service: Cardiovascular;;  . Cardiac catheterization N/A 05/27/2015    Procedure: Left Heart Cath and Coronary Angiography;  Surgeon: Burnell Blanks, MD;  Location: [place] CV  LAB;  Service: Cardiovascular;  Laterality: N/A;    Current Outpatient Prescriptions  Medication Sig Dispense Refill  . aspirin EC 81 MG tablet Take 81 mg by mouth daily.    . citalopram (CELEXA) 40 MG tablet Take 1 tablet (40 mg total) by mouth daily. 90 tablet 3  . clopidogrel (PLAVIX) 75 MG tablet TAKE 1 TABLET BY MOUTH ONCE A DAY. 90 tablet 3  . furosemide (LASIX) 40 MG tablet Take 1 tablet (40 mg total) by mouth daily. 90 tablet 3  . ibuprofen (ADVIL,MOTRIN) 200 MG tablet Take 600 mg by mouth every 6 (six) hours as needed. pain    . losartan (COZAAR) 50 MG tablet Take 1 tablet (50 mg total) by mouth daily. 90 tablet 3  . metoprolol succinate (TOPROL-XL) 25 MG 24 hr tablet Take 1 tablet (25 mg total) by mouth daily. Take with or immediately following a meal. 30 tablet 3  . mometasone (NASONEX) 50 MCG/ACT nasal spray Place 2 sprays into the nose daily. (Patient taking differently: Place 2 sprays into the nose daily as needed (allergies). ) 17 g 12  . Multiple Vitamin (MULTIVITAMIN WITH MINERALS) TABS tablet Take 1 tablet by mouth daily.    Marland Kitchen NITROSTAT 0.4 MG SL tablet DISSOLVE 1 TABLET UNDER TONGUE EVERY 5 MINUTES UP TO 15 MIN FOR CHESTPAIN. IF NO RELIEF CALL 911. 25 tablet 6  . pantoprazole (PROTONIX) 40 MG tablet Take 1 tablet (40 mg total) by mouth daily. 90 tablet 3  . potassium chloride (K-DUR,KLOR-CON) 20 MEQ tablet Take 1 tablet (20 mEq total) by mouth daily. 90 tablet 3  . ranolazine (RANEXA) 500 MG 12 hr tablet Take 1 tablet (500 mg total) by mouth 2 (two) times daily. 60 tablet 11  . simvastatin (ZOCOR) 20 MG tablet Take one tablet on Mondays, Wednesdays, and Fridays 15 tablet 11  . tetrahydrozoline-zinc (VISINE-AC) 0.05-0.25 % ophthalmic solution Place 2 drops into both eyes 3 (three) times daily as needed (itching/burning).     No current facility-administered medications for this visit.    Allergies  Allergen Reactions  . Sulfa Antibiotics Rash  . Hydrocodone Hives and  Itching    Social History   Social History  . Marital Status: Married    Spouse Name: N/A  . Number of Children: 3  . Years of Education: N/A   Occupational History  . 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 Smoking status: Former Smoker -- 3.00 packs/day for 40 years    Types: Cigarettes    Quit date: 01/23/2003  . Smokeless tobacco: Never Used  . 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 Drug Use: No     Comment: 05/20/12 "last marijuana was 2004"  . Sexual Activity: Yes   Other Topics Concern  . Not on file   Social History Narrative    Family History  Problem Relation Age of Onset  . Heart disease Mother   . Cancer Mother     ? type  . Diabetes Father   . Hyperlipidemia Father   . Hypertension Father   . Stomach cancer Father   . Heart disease Brother 30    X 5 brothers    Review of Systems:  As stated in the HPI and otherwise negative.   BP 108/68 mmHg  Pulse 74  Ht 5' 6"  (1.676 m)  Wt 176 lb (79.833 kg)  BMI 28.42 kg/m2  Physical Examination: General: Well developed, well nourished, NAD HEENT: OP clear, mucus membranes moist SKIN: warm, dry. No rashes. Neuro: No focal deficits Musculoskeletal: Muscle strength 5/5 all ext Psychiatric: Mood and affect normal Neck: No JVD, no carotid bruits, no thyromegaly, no lymphadenopathy. Lungs:Clear bilaterally, no wheezes, rhonci, crackles Cardiovascular: Regular rate and rhythm. No murmurs, gallops or rubs. Abdomen:Soft. Bowel sounds present. Non-tender.  Extremities: No lower extremity edema. Pulses are 2 + in the bilateral DP/PT.   Cardiac cath July 2016: Left Anterior Descending  The vessel is large .   Marland Kitchen Ost LAD to Prox LAD lesion, 100% stenosed. chronic total occlusion .      Ramus Intermedius  The vessel is moderate in size .   Marland Kitchen Ramus lesion, 50% stenosed. diffuse .     Left Circumflex   . Second Obtuse Marginal Branch     The vessel is small in size.   Colon Flattery 2nd Mrg lesion, 80% stenosed. discrete . Very small caliber vessel.   . Third Obtuse Marginal Branch   The vessel is small in size.     Right Coronary Artery   . Prox RCA lesion, 30% stenosed. diffuse .   Marland Kitchen Dist RCA lesion, 90% stenosed. discrete .   Marland Kitchen Right Posterior Descending Artery   The vessel is small in size.   . Right Posterior Atrioventricular Branch   The vessel is small in size.     Graft Angiography    Free LIMA Graft to Mid LAD  LIMA was injected is normal in caliber, and is anatomically normal.     Free Graft to 1st Diag  SVG was injected . There is severe disease in the graft. Graft is chronically occluded.   . Origin to Prox Graft lesion, 100% stenosed. chronic total occlusion .     Free Graft to 2nd Mrg  SVG was injected . There is severe disease in the graft. The graft is chronically occluded.   . Origin lesion, 100% stenosed. chronic total occlusion .     Free Graft to RPDA  SVG was injected . There is severe disease in the graft. The graft is occluded.   . Origin lesion, 100% stenosed. chronic total occlusion .         Echo August 2015: Left ventricle: The cavity size was normal. Systolic function was normal. The estimated ejection fraction was in the range of 55% to 60%. Wall motion was normal; there were no regional wall motion abnormalities. Doppler parameters are consistent with abnormal left ventricular relaxation (grade 1 diastolic dysfunction). There was no evidence of elevated ventricular filling pressure by Doppler parameters. - Aortic valve: Trileaflet; normal  thickness leaflets. There was no regurgitation. - Aortic root: The aortic root was normal in size. - Mitral valve: Thickened anterior leaflet of the mitral valve with mild prolapse. There was mild regurgitation directed posteriorly. - Left atrium: The atrium was mildly dilated. - Right ventricle: Systolic function was  normal. - Right atrium: The atrium was normal in size. - Tricuspid valve: There was no regurgitation. - Pericardium, extracardiac: There was no pericardial effusion. Impressions: - Normal biventricular size and systolic function. Mild prolapse of the anterior leaflet of the mitral valve with mild mitral regurgitation with posteriorly directed jet.  EKG:  EKG is ordered today. The ekg ordered today demonstrates sinus brady, rate 53 bpm. 1st degree AV block. Old inferior MI  Recent Labs: 10/27/2014: ALT 24 05/19/2015: BUN 16; Creatinine, Ser 1.15; Hemoglobin 15.5; Platelets 212.0; Potassium 4.2; Sodium 139   Lipid Panel    Component Value Date/Time   CHOL 140 07/08/2014 0909   TRIG 184* 07/08/2014 0909   HDL 36* 07/08/2014 0909   CHOLHDL 3.9 07/08/2014 0909   VLDL 37 07/08/2014 0909   LDLCALC 67 07/08/2014 0909     Wt Readings from Last 3 Encounters:  08/03/15 176 lb (79.833 kg)  06/24/15 179 lb 12.8 oz (81.557 kg)  05/27/15 179 lb (81.194 kg)     Other studies Reviewed: Additional studies/ records that were reviewed today include: . Review of the above records demonstrates:    Assessment and Plan:   1. CAD/Unstable angina: Stable. Will continue ASA, Plavix, beta blocker, Ranexa, statin.  2. HTN: BP well controlled. No changes.   3. Hyperlipidemia:  Lipids well controlled. He takes Zocor every other day.   4. Mitral regurgitation: Mild by echo August 2015.    5. Acute on chronic diastolic CHF: Weight is stable. No changes.   Current medicines are reviewed at length with the patient today.  The patient does not have concerns regarding medicines.  The following changes have been made:  no change  Labs/ tests ordered today include:  No orders of the defined types were placed in this encounter.    Disposition:   FU with me in   Signed, Lauree Chandler, MD 08/03/2015 12:38 PM    [address], [place], [place]   [address] Phone: [phone]; Fax: [phone]

## 2015-08-03 NOTE — Patient Instructions (Signed)

## 2015-08-07 ENCOUNTER — Observation Stay (HOSPITAL_COMMUNITY)
Admission: EM | Admit: 2015-08-07 | Discharge: 2015-08-08 | Disposition: A | Discharge status: Home / Self Care | Payer: Medicare HMO | Attending: Family Medicine | Admitting: Family Medicine

## 2015-08-07 ENCOUNTER — Encounter (HOSPITAL_COMMUNITY): Payer: Self-pay | Admitting: Emergency Medicine

## 2015-08-07 ENCOUNTER — Emergency Department (HOSPITAL_COMMUNITY): Payer: Medicare HMO

## 2015-08-07 DIAGNOSIS — R7989 Other specified abnormal findings of blood chemistry: Secondary | ICD-10-CM | POA: Diagnosis not present

## 2015-08-07 DIAGNOSIS — I10 Essential (primary) hypertension: Secondary | ICD-10-CM | POA: Diagnosis not present

## 2015-08-07 DIAGNOSIS — E786 Lipoprotein deficiency: Secondary | ICD-10-CM | POA: Diagnosis not present

## 2015-08-07 DIAGNOSIS — I340 Nonrheumatic mitral (valve) insufficiency: Secondary | ICD-10-CM | POA: Diagnosis not present

## 2015-08-07 DIAGNOSIS — K219 Gastro-esophageal reflux disease without esophagitis: Secondary | ICD-10-CM | POA: Diagnosis not present

## 2015-08-07 DIAGNOSIS — I2510 Atherosclerotic heart disease of native coronary artery without angina pectoris: Secondary | ICD-10-CM | POA: Diagnosis not present

## 2015-08-07 DIAGNOSIS — M791 Myalgia: Secondary | ICD-10-CM | POA: Insufficient documentation

## 2015-08-07 DIAGNOSIS — R509 Fever, unspecified: Secondary | ICD-10-CM | POA: Diagnosis present

## 2015-08-07 DIAGNOSIS — G8929 Other chronic pain: Secondary | ICD-10-CM | POA: Insufficient documentation

## 2015-08-07 DIAGNOSIS — N179 Acute kidney failure, unspecified: Secondary | ICD-10-CM | POA: Diagnosis not present

## 2015-08-07 DIAGNOSIS — Z951 Presence of aortocoronary bypass graft: Secondary | ICD-10-CM | POA: Insufficient documentation

## 2015-08-07 DIAGNOSIS — F419 Anxiety disorder, unspecified: Secondary | ICD-10-CM | POA: Insufficient documentation

## 2015-08-07 DIAGNOSIS — R748 Abnormal levels of other serum enzymes: Secondary | ICD-10-CM

## 2015-08-07 DIAGNOSIS — Z87891 Personal history of nicotine dependence: Secondary | ICD-10-CM | POA: Insufficient documentation

## 2015-08-07 DIAGNOSIS — F329 Major depressive disorder, single episode, unspecified: Secondary | ICD-10-CM | POA: Insufficient documentation

## 2015-08-07 DIAGNOSIS — N289 Disorder of kidney and ureter, unspecified: Principal | ICD-10-CM | POA: Insufficient documentation

## 2015-08-07 DIAGNOSIS — E785 Hyperlipidemia, unspecified: Secondary | ICD-10-CM | POA: Diagnosis not present

## 2015-08-07 DIAGNOSIS — K429 Umbilical hernia without obstruction or gangrene: Secondary | ICD-10-CM | POA: Insufficient documentation

## 2015-08-07 DIAGNOSIS — Z23 Encounter for immunization: Secondary | ICD-10-CM | POA: Insufficient documentation

## 2015-08-07 DIAGNOSIS — I25810 Atherosclerosis of coronary artery bypass graft(s) without angina pectoris: Secondary | ICD-10-CM | POA: Diagnosis present

## 2015-08-07 DIAGNOSIS — R945 Abnormal results of liver function studies: Secondary | ICD-10-CM

## 2015-08-07 DIAGNOSIS — I252 Old myocardial infarction: Secondary | ICD-10-CM | POA: Diagnosis not present

## 2015-08-07 DIAGNOSIS — I25708 Atherosclerosis of coronary artery bypass graft(s), unspecified, with other forms of angina pectoris: Secondary | ICD-10-CM | POA: Diagnosis not present

## 2015-08-07 LAB — CBC WITH DIFFERENTIAL/PLATELET
BASOS ABS: 0 10*3/uL (ref 0.0–0.1)
BASOS PCT: 0 %
EOS ABS: 0.7 10*3/uL (ref 0.0–0.7)
Eosinophils Relative: 7 %
HCT: 39.5 % (ref 39.0–52.0)
Hemoglobin: 13.5 g/dL (ref 13.0–17.0)
Lymphocytes Relative: 17 %
Lymphs Abs: 1.6 10*3/uL (ref 0.7–4.0)
MCH: 30.2 pg (ref 26.0–34.0)
MCHC: 34.2 g/dL (ref 30.0–36.0)
MCV: 88.4 fL (ref 78.0–100.0)
MONO ABS: 1.5 10*3/uL — AB (ref 0.1–1.0)
Monocytes Relative: 16 %
Neutro Abs: 5.8 10*3/uL (ref 1.7–7.7)
Neutrophils Relative %: 60 %
Platelets: 198 10*3/uL (ref 150–400)
RBC: 4.47 MIL/uL (ref 4.22–5.81)
RDW: 14.4 % (ref 11.5–15.5)
WBC: 9.6 10*3/uL (ref 4.0–10.5)

## 2015-08-07 LAB — BASIC METABOLIC PANEL
Anion gap: 11 (ref 5–15)
BUN: 26 mg/dL — ABNORMAL HIGH (ref 6–20)
CALCIUM: 9.2 mg/dL (ref 8.9–10.3)
CO2: 23 mmol/L (ref 22–32)
CREATININE: 1.9 mg/dL — AB (ref 0.61–1.24)
Chloride: 104 mmol/L (ref 101–111)
GFR, EST AFRICAN AMERICAN: 43 mL/min — AB (ref >60)
GFR, EST NON AFRICAN AMERICAN: 37 mL/min — AB (ref >60)
Glucose, Bld: 122 mg/dL — ABNORMAL HIGH (ref 65–99)
Potassium: 4.4 mmol/L (ref 3.5–5.1)
SODIUM: 138 mmol/L (ref 135–145)

## 2015-08-07 LAB — URINALYSIS, ROUTINE W REFLEX MICROSCOPIC
Glucose, UA: NEGATIVE mg/dL
HGB URINE DIPSTICK: NEGATIVE
KETONES UR: NEGATIVE mg/dL
Leukocytes, UA: NEGATIVE
Nitrite: NEGATIVE
Protein, ur: NEGATIVE mg/dL
SPECIFIC GRAVITY, URINE: 1.02 (ref 1.005–1.030)
UROBILINOGEN UA: 2 mg/dL — AB (ref 0.0–1.0)
pH: 6 (ref 5.0–8.0)

## 2015-08-07 LAB — LIPASE, BLOOD: LIPASE: 21 U/L — AB (ref 22–51)

## 2015-08-07 LAB — HEPATIC FUNCTION PANEL
ALK PHOS: 448 U/L — AB (ref 38–126)
ALT: 160 U/L — ABNORMAL HIGH (ref 17–63)
AST: 89 U/L — ABNORMAL HIGH (ref 15–41)
Albumin: 3.4 g/dL — ABNORMAL LOW (ref 3.5–5.0)
Bilirubin, Direct: 1.4 mg/dL — ABNORMAL HIGH (ref 0.1–0.5)
Indirect Bilirubin: 0.8 mg/dL (ref 0.3–0.9)
TOTAL PROTEIN: 7.3 g/dL (ref 6.5–8.1)
Total Bilirubin: 2.2 mg/dL — ABNORMAL HIGH (ref 0.3–1.2)

## 2015-08-07 LAB — INFLUENZA PANEL BY PCR (TYPE A & B)
H1N1 flu by pcr: NOT DETECTED
INFLAPCR: NEGATIVE
Influenza B By PCR: NEGATIVE

## 2015-08-07 IMAGING — CT CT ABD-PELV W/O CM
2 of 4 series · 16 of 46 positions shown, 18 images · non-contrast
Comparison: CT chest [DATE]

CLINICAL DATA: Flu-like symptoms, elevated liver enzymes

EXAM:
CT ABDOMEN AND PELVIS WITHOUT CONTRAST
TECHNIQUE: Multidetector CT imaging of the abdomen and pelvis was performed
following the standard protocol without IV contrast.

[Series 2: abdomen/pelvis w/o contrast · axial · non-contrast · 0.75mm/px · z∈[-516,-96]mm · 13 of 94 slices shown, 15 images]
[im 5/94  soft-tissue]
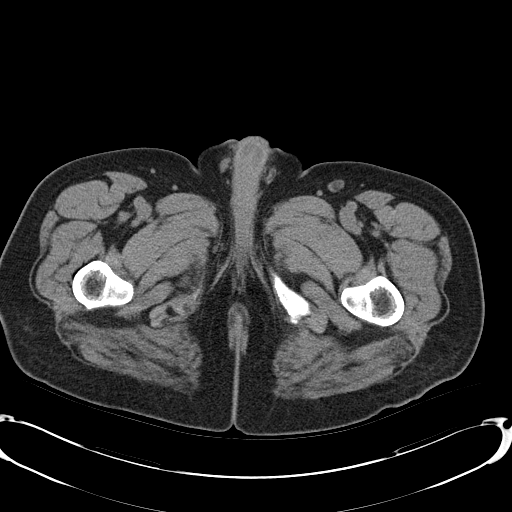
[im 5/94  bone]
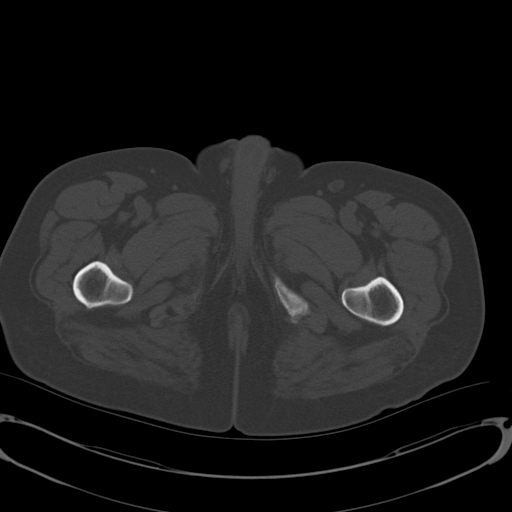
[im 13/94  soft-tissue]
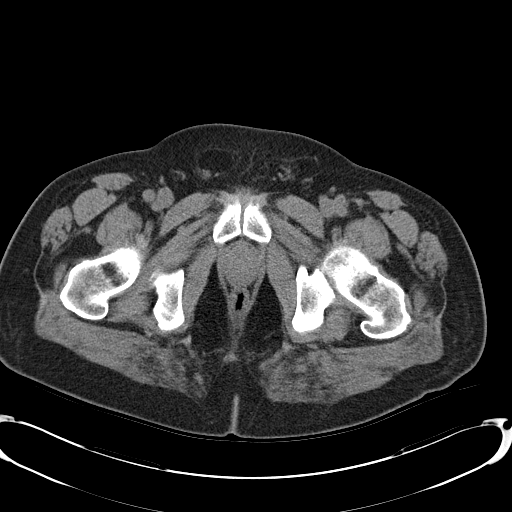
[im 21/94  soft-tissue]
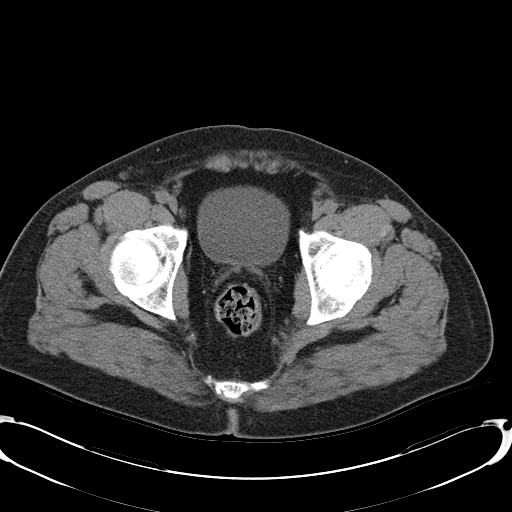
[im 25/94  soft-tissue]
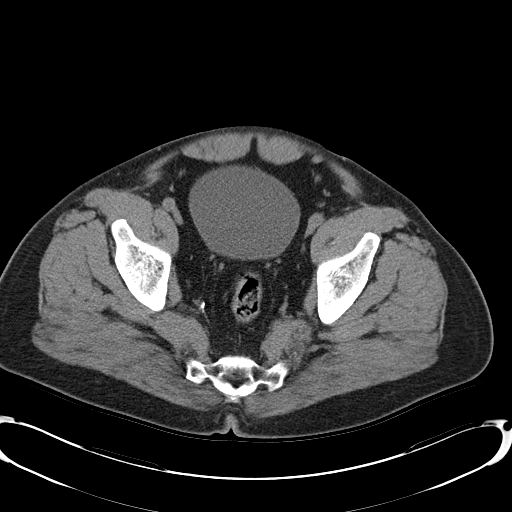
[im 33/94  soft-tissue]
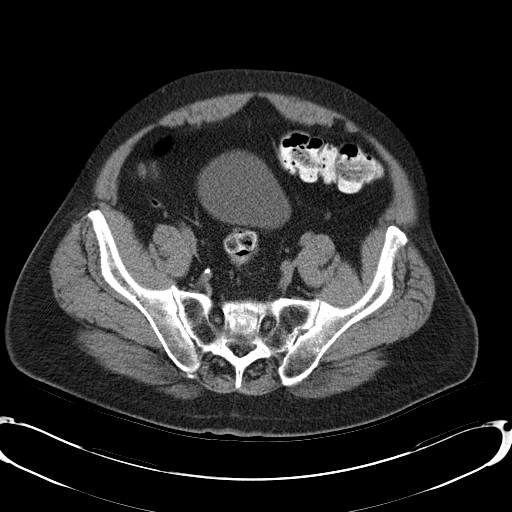
[im 41/94  soft-tissue]
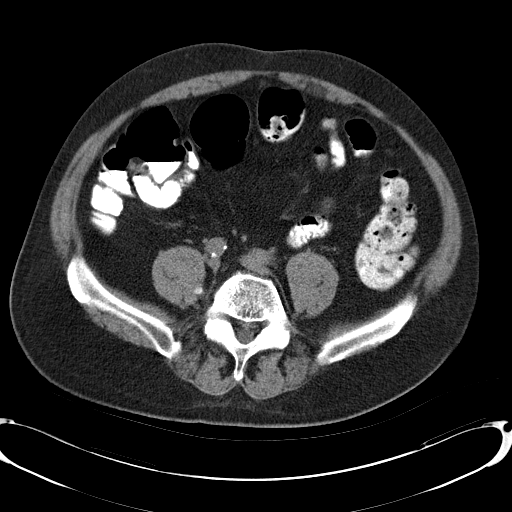
[im 49/94  soft-tissue]
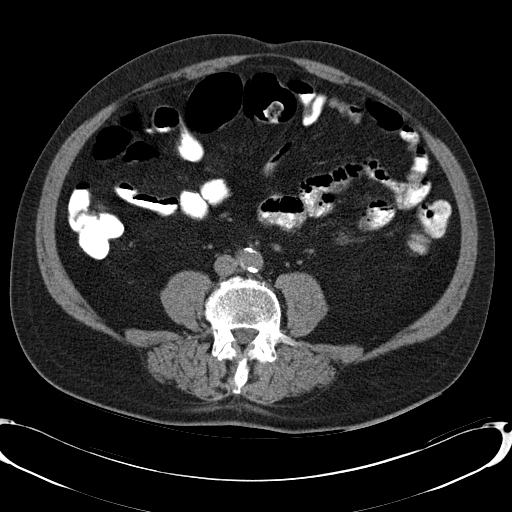
[im 53/94  soft-tissue]
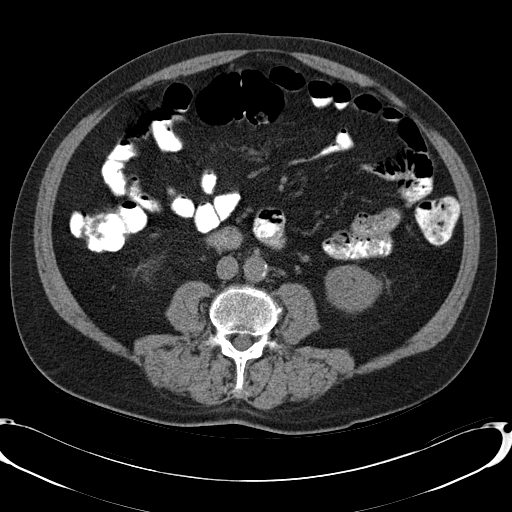
[im 61/94  soft-tissue]
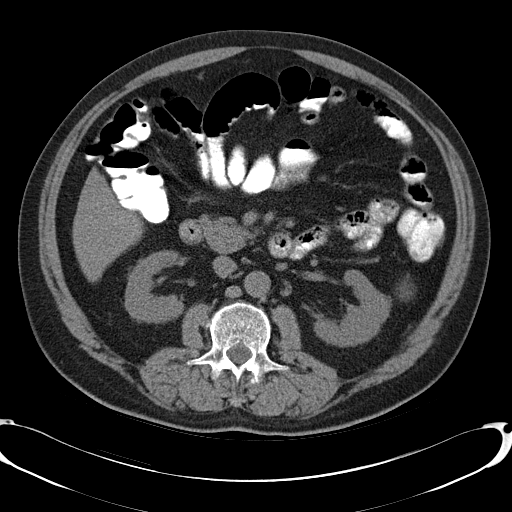
[im 61/94  bone]
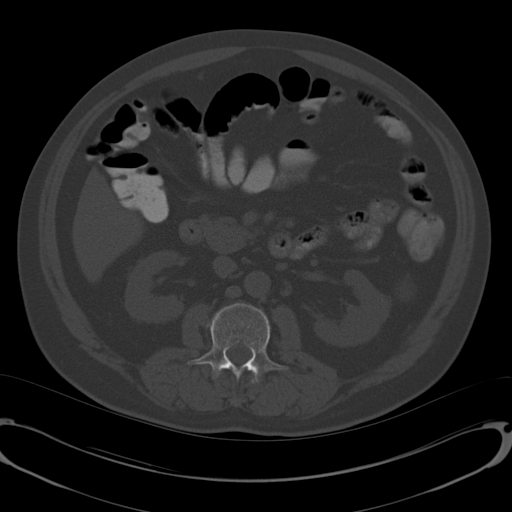
[im 69/94  soft-tissue]
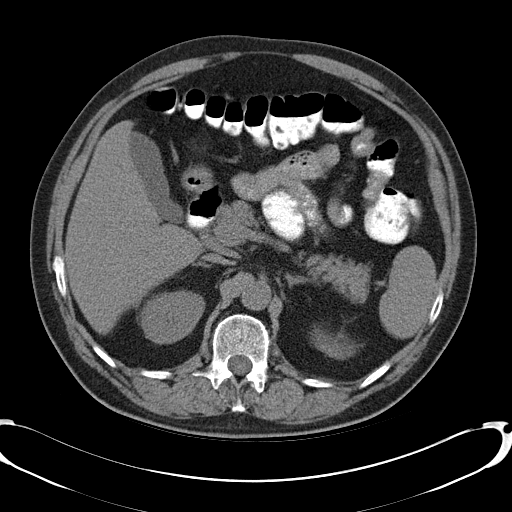
[im 73/94  soft-tissue]
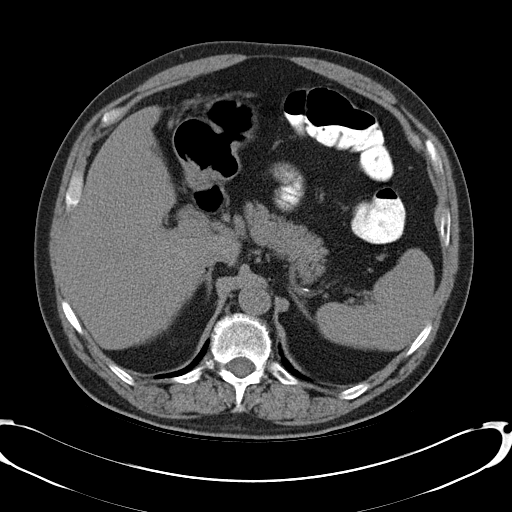
[im 81/94  soft-tissue]
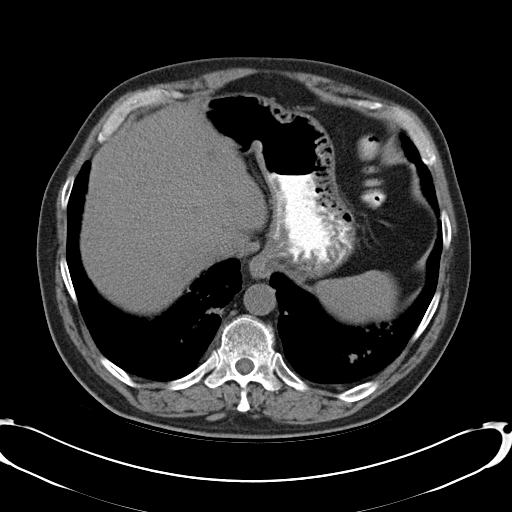
[im 89/94  soft-tissue]
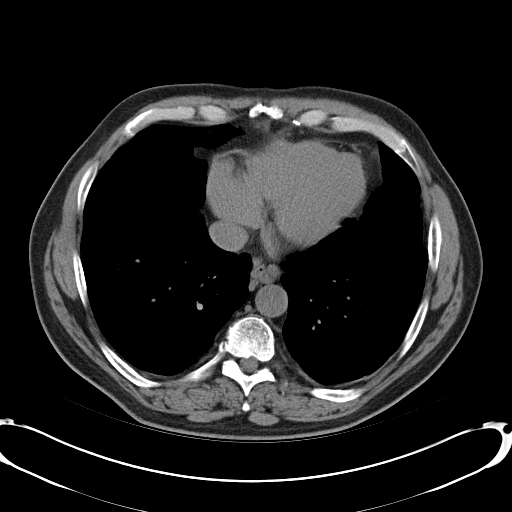

[Series 3: mpr cor 3.0mm · coronal · 0.71mm/px · 3 of 105 slices shown]
[im 35/105  soft-tissue]
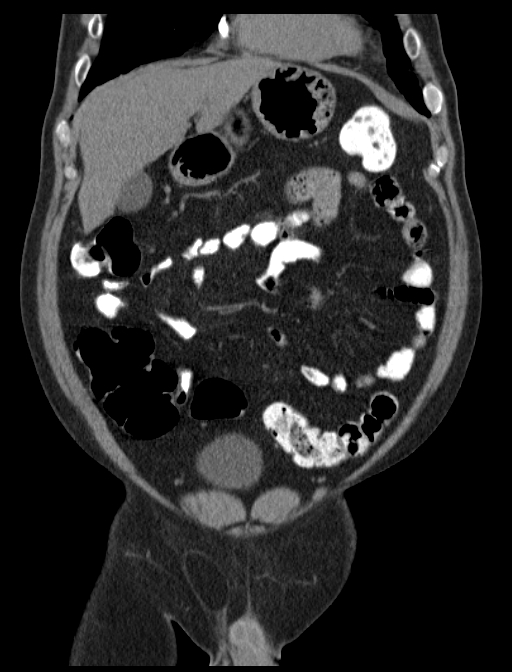
[im 47/105  soft-tissue]
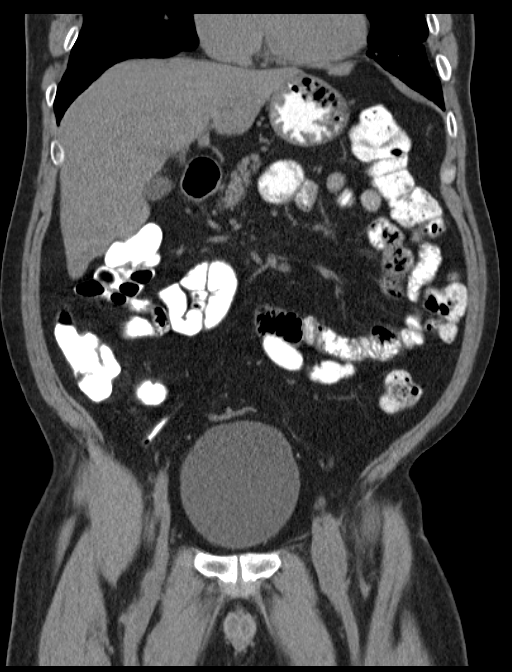
[im 58/105  soft-tissue]
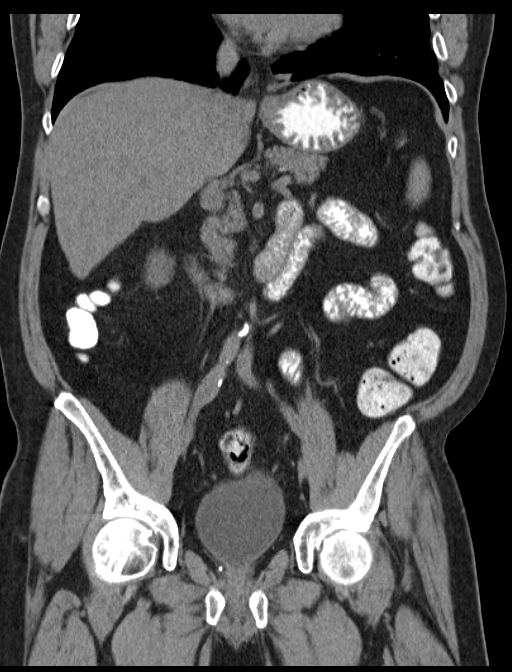

[16 of 46 positions shown; findings below may reference images not displayed]

FINDINGS: Lung bases shows patchy nodular infiltrate bilaterally. Pneumonia
cannot be excluded. Clinical correlation is necessary. Follow-up to
resolution is recommended.

Sagittal images of the spine shows degenerative changes
thoracolumbar spine. Unenhanced liver shows no biliary ductal
dilatation. No calcified gallstones are noted within gallbladder.
Unenhanced pancreas, spleen and adrenal glands are unremarkable.
Small hiatal hernia. No aortic aneurysm. Atherosclerotic
calcifications of abdominal aorta and iliac arteries.

Unenhanced kidneys are symmetrical in size. No nephrolithiasis. No
hydronephrosis or hydroureter.

Oral contrast material was given to the patient. There is no small
bowel obstruction. No ascites or free air. No adenopathy. Normal
appendix. No pericecal inflammation.

The urinary bladder is unremarkable. Prostate gland and seminal
vesicles are unremarkable. Moderate stool is noted in descending
colon and rectosigmoid colon. No distal colonic obstruction. No
colitis or diverticulitis. There is small right inguinal scrotal
canal hernia containing fat measures 2.2 cm.
IMPRESSION: 1. Bilateral lung bases patchy nodular infiltrate. Multifocal
pneumonia cannot be excluded. Follow-up to resolution is
recommended.
2. No nephrolithiasis.  No hydronephrosis or hydroureter.
3. Normal appendix.  No pericecal inflammation.
4. No small bowel obstruction.
5. Moderate stool noted in distal colon. No colitis or
diverticulitis.

## 2015-08-07 MED ORDER — METOPROLOL SUCCINATE ER 25 MG PO TB24
25.0000 mg | ORAL_TABLET | Freq: Every day | ORAL | Status: DC
  Administered 2015-08-07 – 2015-08-08 (×2): 25 mg via ORAL
  Filled 2015-08-07 (×2): qty 1

## 2015-08-07 MED ORDER — CITALOPRAM HYDROBROMIDE 20 MG PO TABS
40.0000 mg | ORAL_TABLET | Freq: Every day | ORAL | Status: DC
  Administered 2015-08-07 – 2015-08-08 (×2): 40 mg via ORAL
  Filled 2015-08-07 (×2): qty 2

## 2015-08-07 MED ORDER — RANOLAZINE ER 500 MG PO TB12
500.0000 mg | ORAL_TABLET | Freq: Two times a day (BID) | ORAL | Status: DC
  Administered 2015-08-07 – 2015-08-08 (×2): 500 mg via ORAL
  Filled 2015-08-07 (×2): qty 1

## 2015-08-07 MED ORDER — NITROGLYCERIN 0.4 MG SL SUBL: 0.4000 mg | SUBLINGUAL_TABLET | SUBLINGUAL | Status: DC | PRN

## 2015-08-07 MED ORDER — SODIUM CHLORIDE 0.9 % IV SOLN
INTRAVENOUS | Status: DC
  Administered 2015-08-07 – 2015-08-08 (×4): via INTRAVENOUS

## 2015-08-07 MED ORDER — CLOPIDOGREL BISULFATE 75 MG PO TABS
75.0000 mg | ORAL_TABLET | Freq: Every day | ORAL | Status: DC
Start: 2015-08-07 — End: 2015-08-08
  Administered 2015-08-07 – 2015-08-08 (×2): 75 mg via ORAL
  Filled 2015-08-07 (×2): qty 1

## 2015-08-07 MED ORDER — IOHEXOL 300 MG/ML  SOLN
50.0000 mL | Freq: Once | INTRAMUSCULAR | Status: AC | PRN
  Administered 2015-08-07: 50 mL via ORAL

## 2015-08-07 MED ORDER — ASPIRIN EC 81 MG PO TBEC
81.0000 mg | DELAYED_RELEASE_TABLET | Freq: Every day | ORAL | Status: DC
  Administered 2015-08-08: 81 mg via ORAL
  Filled 2015-08-07: qty 1

## 2015-08-07 MED ORDER — INFLUENZA VAC SPLIT QUAD 0.5 ML IM SUSY
0.5000 mL | PREFILLED_SYRINGE | INTRAMUSCULAR | Status: AC
  Administered 2015-08-08: 0.5 mL via INTRAMUSCULAR
  Filled 2015-08-07: qty 0.5

## 2015-08-07 MED ORDER — PANTOPRAZOLE SODIUM 40 MG PO TBEC
40.0000 mg | DELAYED_RELEASE_TABLET | Freq: Every day | ORAL | Status: DC
  Administered 2015-08-07 – 2015-08-08 (×2): 40 mg via ORAL
  Filled 2015-08-07 (×2): qty 1

## 2015-08-07 NOTE — ED Notes (Signed)
Pt ambulated to bathroom 

## 2015-08-07 NOTE — ED Notes (Signed)
Fever since last Thursday.  Temperature last night 101, given ibuprofen.  C/o dark urine for last 7 days.  Denies any burning when voiding.

## 2015-08-07 NOTE — H&P (Addendum)
History and Physical  Frank [name] OZH:[id number] DOB: 06-16-1956 DOA: 08/07/2015  Referring physician: [name], MD  PCP: Mickie Hillier, MD   Chief Complaint: Fever, UTI  HPI:   59 y.o. man with a history of CAD, GERD, and CABG x 4 in 2004 presented with fever (Tmax 101F last night), chills, generalized myalgias and dark urine. Labs revealed AKI, elevated alkaline phosphatase, AST and ALT, and elevated Tbili.  Abdominal CT revealed bilateral patchy nodular infiltrates at the bases, consistent with possible PNA. It was otherwise unremarkable with unremarkable appearance of gallbladder and liver.  Admitted for further management and treatment of possible CAP and hepatits.   Reports fatigue for the last month, worsening the last week. Also has flu-like symptoms x1 week consisting of a fever (Tmax 101F last night), chills, and body aches. Physical exertion exacerbates his symptoms and he has been taking Ibuprofen (minimal intake) with minimal relief. He also reports dark urine x1 week but denies any dysuria, hematuria, abdominal pain, n/v, CP, SOB. Reports normal fluid and oral intake. No pain with eating. No abdominal pain. Wife noticed dark urine and insisted he come to ED.  Wife reports he recently began taking Ranexa 1 month ago. He denies any recent travel or drinking untreated water. No history of hepatitis. Has been on simvastatin for a long time though dose has been decreased secondary to myalgias.  LFTs were normal 10/27/2014. Hepatitis panel was negative 10/2009.  In the emergency department VSS, afebrile, not hypoxic Pertinent labs: CMP reveals elevated BUN 26, Creatinine 1.9, Alkaline Phosphatase 448, AST 89, ALT 160, T Bili 2.2, CBC normal. UA unremarkable  Imaging: independently reviewed. Abdominal CT showed patchy bilateral nodular lung infiltrates, consider PNA. No other abnormalities noted. Liver and gallbladder appear normal.   Review of Systems:  Positive for fever, chills,  fatigue, generalized myalgias, dark urine, intermittent blurry vision Negative for sore throat, rash, chest pain, SOB, dysuria, bleeding, n/v/abdominal pain.  Past Medical History  Diagnosis Date  . Mitral regurgitation     Moderate by TEE 03/27/12  . Low HDL (under 40)   . GERD (gastroesophageal reflux disease)   . Hypertension   . CAD (coronary artery disease)     5V CABG in 2004; stent to RCA x1 and SVG to acute marginal x2 2011;  PTCA/DES x 2 body of SVG to PDA  05/20/12  . CHF (congestive heart failure)      EF 55% by echo 03/27/2012  . Depression   . Numbness and tingling in hands   . Numbness and tingling of both legs   . Umbilical hernia     unrepaired (05/20/12)  . Arthritis     hands, shoulders, arms  . Chronic lower back pain     "w/activity"  . Anxiety   . HLD (hyperlipidemia)   . Myocardial infarction 2004    Past Surgical History  Procedure Laterality Date  . Tee without cardioversion  03/27/2012    Procedure: TRANSESOPHAGEAL ECHOCARDIOGRAM (TEE);  Surgeon: Laurey Morale, MD;  Location: [facility] ENDOSCOPY;  Service: Cardiovascular;  Laterality: N/A;  to be done at 1100  . Coronary artery bypass graft  2004    5v CABG   . Incision and drainage of wound  2010    "from bite; maybe snake or spider; almost lost LLE"  . Ptca  10/30/2013    DES  SVG    . Coronary angioplasty with stent placement  05/11    stent to RCA x 1 and  SVG-acute marginal x2. EF normal  . Coronary angioplasty with stent placement  05/20/12    PTCA/DES x 2 body of SVG to PDA   . Left heart catheterization with coronary/graft angiogram N/A 05/20/2012    Procedure: LEFT HEART CATHETERIZATION WITH Isabel Caprice;  Surgeon: Kathleene Hazel, MD;  Location: [facility] CATH LAB;  Service: Cardiovascular;  Laterality: N/A;  . Left heart catheterization with coronary/graft angiogram N/A 10/30/2013    Procedure: LEFT HEART CATHETERIZATION WITH Isabel Caprice;  Surgeon: Kathleene Hazel, MD;   Location: [facility] CATH LAB;  Service: Cardiovascular;  Laterality: N/A;  . Percutaneous coronary stent intervention (pci-s)  10/30/2013    Procedure: PERCUTANEOUS CORONARY STENT INTERVENTION (PCI-S);  Surgeon: Kathleene Hazel, MD;  Location: [facility] CATH LAB;  Service: Cardiovascular;;  . Cardiac catheterization N/A 05/27/2015    Procedure: Left Heart Cath and Coronary Angiography;  Surgeon: Kathleene Hazel, MD;  Location: [facility] INVASIVE CV LAB;  Service: Cardiovascular;  Laterality: N/A;    Social History:  reports that he quit smoking about 12 years ago. His smoking use included Cigarettes. He has a 120 pack-year smoking history. He has never used smokeless tobacco. He reports that he does not drink alcohol or use illicit drugs. lives with their spouse Self-care  Allergies  Allergen Reactions  . Sulfa Antibiotics Rash  . Hydrocodone Hives and Itching    Family History  Problem Relation Age of Onset  . Heart disease Mother   . Cancer Mother     ? type  . Diabetes Father   . Hyperlipidemia Father   . Hypertension Father   . Stomach cancer Father   . Heart disease Brother 5    X 5 brothers     Prior to Admission medications   Medication Sig Start Date End Date Taking? Authorizing Provider  aspirin EC 81 MG tablet Take 81 mg by mouth daily.   Yes Historical Provider, MD  citalopram (CELEXA) 40 MG tablet Take 1 tablet (40 mg total) by mouth daily. 10/29/14  Yes Elenora Gamma, MD  clopidogrel (PLAVIX) 75 MG tablet TAKE 1 TABLET BY MOUTH ONCE A DAY. 05/24/15  Yes Kathee Delton, MD  furosemide (LASIX) 40 MG tablet Take 1 tablet (40 mg total) by mouth daily. 12/22/14  Yes Kathleene Hazel, MD  losartan (COZAAR) 50 MG tablet Take 1 tablet (50 mg total) by mouth daily. 10/27/14  Yes Elenora Gamma, MD  metoprolol succinate (TOPROL-XL) 25 MG 24 hr tablet Take 1 tablet (25 mg total) by mouth daily. Take with or immediately following a meal. 06/24/15  Yes Beatrice Lecher, PA-C    Multiple Vitamin (MULTIVITAMIN WITH MINERALS) TABS tablet Take 1 tablet by mouth daily.   Yes Historical Provider, MD  pantoprazole (PROTONIX) 40 MG tablet Take 1 tablet (40 mg total) by mouth daily. 12/22/14  Yes Elenora Gamma, MD  potassium chloride (K-DUR,KLOR-CON) 20 MEQ tablet Take 1 tablet (20 mEq total) by mouth daily. 12/22/14 12/22/15 Yes Kathleene Hazel, MD  ranolazine (RANEXA) 500 MG 12 hr tablet Take 1 tablet (500 mg total) by mouth 2 (two) times daily. 06/24/15  Yes Beatrice Lecher, PA-C  ibuprofen (ADVIL,MOTRIN) 200 MG tablet Take 600 mg by mouth every 6 (six) hours as needed. pain    Historical Provider, MD  mometasone (NASONEX) 50 MCG/ACT nasal spray Place 2 sprays into the nose daily. Patient taking differently: Place 2 sprays into the nose daily as needed (allergies).  07/08/14   Pamala Duffel  Ermalinda Memos, MD  NITROSTAT 0.4 MG SL tablet DISSOLVE 1 TABLET UNDER TONGUE EVERY 5 MINUTES UP TO 15 MIN FOR CHESTPAIN. IF NO RELIEF CALL 911. 06/06/15   Kathleene Hazel, MD  simvastatin (ZOCOR) 20 MG tablet Take one tablet on Mondays, Wednesdays, and Fridays 06/24/15   Beatrice Lecher, PA-C  tetrahydrozoline-zinc (VISINE-AC) 0.05-0.25 % ophthalmic solution Place 2 drops into both eyes 3 (three) times daily as needed (itching/burning).    Historical Provider, MD   Physical Exam: Filed Vitals:   08/07/15 1038 08/07/15 1100 08/07/15 1130 08/07/15 1200  BP: 109/73 115/77 105/74 112/75  Pulse: 68 59 64 60  Temp:      TempSrc:      Resp: 14     Height:      Weight:      SpO2: 97% 97% 96% 96%    VSS, not hypoxic, afebrile General:  Appears comfortable, calm. Eyes: PERRL, normal lids, irises ENT: grossly normal hearing, lips, tongue Neck: no LAD, masses, thyromegaly Cardiovascular: Regular rate and rhythm, no murmur, rub or gallop. No lower extremity edema. Respiratory: Clear to auscultation bilaterally, no wheezes, rales or rhonchi. Normal respiratory effort. Abdomen: soft, ntnd, no RUQ  pain. Skin: no rash or induration noted Musculoskeletal: grossly normal tone bilateral upper and lower extremities Psychiatric: grossly normal mood and affect, speech fluent and appropriate Neurologic: grossly non-focal.   Wt Readings from Last 3 Encounters:  08/07/15 79.833 kg (176 lb)  08/03/15 79.833 kg (176 lb)  06/24/15 81.557 kg (179 lb 12.8 oz)    Labs on Admission:  Basic Metabolic Panel:  Recent Labs Lab 08/07/15 0753  NA 138  K 4.4  CL 104  CO2 23  GLUCOSE 122*  BUN 26*  CREATININE 1.90*  CALCIUM 9.2    Liver Function Tests:  Recent Labs Lab 08/07/15 0753  AST 89*  ALT 160*  ALKPHOS 448*  BILITOT 2.2*  PROT 7.3  ALBUMIN 3.4*    Recent Labs Lab 08/07/15 0753  LIPASE 21*   CBC:  Recent Labs Lab 08/07/15 0753  WBC 9.6  NEUTROABS 5.8  HGB 13.5  HCT 39.5  MCV 88.4  PLT 198    Radiological Exams on Admission: Ct Abdomen Pelvis Wo Contrast  08/07/2015   CLINICAL DATA:  Flu-like symptoms, elevated liver enzymes  EXAM: CT ABDOMEN AND PELVIS WITHOUT CONTRAST  TECHNIQUE: Multidetector CT imaging of the abdomen and pelvis was performed following the standard protocol without IV contrast.  COMPARISON:  CT chest 12/07/2013  FINDINGS: Lung bases shows patchy nodular infiltrate bilaterally. Pneumonia cannot be excluded. Clinical correlation is necessary. Follow-up to resolution is recommended.  Sagittal images of the spine shows degenerative changes thoracolumbar spine. Unenhanced liver shows no biliary ductal dilatation. No calcified gallstones are noted within gallbladder. Unenhanced pancreas, spleen and adrenal glands are unremarkable. Small hiatal hernia. No aortic aneurysm. Atherosclerotic calcifications of abdominal aorta and iliac arteries.  Unenhanced kidneys are symmetrical in size. No nephrolithiasis. No hydronephrosis or hydroureter.  Oral contrast material was given to the patient. There is no small bowel obstruction. No ascites or free air. No  adenopathy. Normal appendix. No pericecal inflammation.  The urinary bladder is unremarkable. Prostate gland and seminal vesicles are unremarkable. Moderate stool is noted in descending colon and rectosigmoid colon. No distal colonic obstruction. No colitis or diverticulitis. There is small right inguinal scrotal canal hernia containing fat measures 2.2 cm.  IMPRESSION: 1. Bilateral lung bases patchy nodular infiltrate. Multifocal pneumonia cannot be excluded. Follow-up to resolution is  recommended. 2. No nephrolithiasis.  No hydronephrosis or hydroureter. 3. Normal appendix.  No pericecal inflammation. 4. No small bowel obstruction. 5. Moderate stool noted in distal colon. No colitis or diverticulitis.   Electronically Signed   By: Natasha Mead M.D.   On: 08/07/2015 12:57      Principal Problem:   Elevated LFTs Active Problems:   CAD   AKI (acute kidney injury)   Assessment/Plan 1. Elevated LFTs, mixed hepatocellular and cholestatic pattern, unclear etiology. On statin for some time. Cold type symptoms suggest possibility of acute hepatitis. No h/o hepatitis A or B vaccination. Abdominal CT unremarkable for liver or gallbladder abnormality. No RUQ or postprandial symptoms. No Tylenol use. No signs of infection. 2. AKI, superimposed on h/o normal renal function. Etiology unclear, no h/o CKD. Reports normal intake. On Lasix, Cozaar as outpatient. Reports minimal ibuprofen use. 3. CAD, s/p CABG, hypertension, HLD. Appears stable.   Plan observation to medical bed  Obtain abdominal US and trend LFTs, hold statin, check acute hepatitis panel.  IVFs, hold ibuprofen, furosemide and losartan. Repeat CMP.  Check influenza panel  Code Status: Full   DVT prophylaxis:Plavix Family Communication: Wife at bedside. Discussed with patient who understands and has no concerns at this time. Disposition Plan/Anticipated LOS: Admit to medical bed. Anticipate LOS 1-2 days.   Time spent: 55 minutes  Brendia Sacks, MD  [facility] Pager [phone] 08/07/2015, 2:03 PM     By signing my name below, I, Burnett Harry attest that this documentation has been prepared under the direction and in the presence of Brendia Sacks, MD Electronically signed: Burnett Harry, Scribe.  08/07/2015  I personally performed the services described in this documentation. All medical record entries made by the scribe were at my direction. I have reviewed the chart and agree that the record reflects my personal performance and is accurate and complete. Brendia Sacks, MD

## 2015-08-07 NOTE — ED Provider Notes (Signed)
CSN: [id number]     Arrival date & time 08/07/15  1191 History  This chart was scribed for Frank Hutching, MD by Ronney Lion, ED Scribe. This patient was seen in room APA03/APA03 and Frank patient's care was started at 8:13 AM.    Chief Complaint  Patient presents with  . Fever  . Urinary Tract Infection   Frank history is provided by Frank patient. No language interpreter was used.   HPI Comments: Frank [name] is a 59 y.o. male with a history of CHF, CAD, MI, CABG x 4 in 2004, and currently taking Plavix and aspirin, who presents to [facility] complaining of fever that measured 101 last night, chills, and generalized myalgias that began 3 days ago. He also complains of dark urine for Frank past 7 days. Patient states he is able to walk around without difficulty. His wife states that his skin appears a normal color and appearance compared to baseline. Patient denies dysuria, urinary frequency, or penile discharge. PCP: Mickie Hillier, MD Cardiologist: Dr. Dicie Beam   Past Medical History  Diagnosis Date  . Mitral regurgitation     Moderate by TEE 03/27/12  . Low HDL (under 40)   . GERD (gastroesophageal reflux disease)   . Hypertension   . CAD (coronary artery disease)     5V CABG in 2004; stent to RCA x1 and SVG to acute marginal x2 2011;  PTCA/DES x 2 body of SVG to PDA  05/20/12  . CHF (congestive heart failure)      EF 55% by echo 03/27/2012  . Depression   . Numbness and tingling in hands   . Numbness and tingling of both legs   . Umbilical hernia     unrepaired (05/20/12)  . Arthritis     hands, shoulders, arms  . Chronic lower back pain     "w/activity"  . Anxiety   . HLD (hyperlipidemia)   . Myocardial infarction 2004   Past Surgical History  Procedure Laterality Date  . Tee without cardioversion  03/27/2012    Procedure: TRANSESOPHAGEAL ECHOCARDIOGRAM (TEE);  Surgeon: Laurey Morale, MD;  Location: [facility] ENDOSCOPY;  Service: Cardiovascular;  Laterality: N/A;  to be done at 1100   . Coronary artery bypass graft  2004    5v CABG   . Incision and drainage of wound  2010    "from bite; maybe snake or spider; almost lost LLE"  . Ptca  10/30/2013    DES  SVG    . Coronary angioplasty with stent placement  05/11    stent to RCA x 1 and SVG-acute marginal x2. EF normal  . Coronary angioplasty with stent placement  05/20/12    PTCA/DES x 2 body of SVG to PDA   . Left heart catheterization with coronary/graft angiogram N/A 05/20/2012    Procedure: LEFT HEART CATHETERIZATION WITH Isabel Caprice;  Surgeon: Kathleene Hazel, MD;  Location: [facility] CATH LAB;  Service: Cardiovascular;  Laterality: N/A;  . Left heart catheterization with coronary/graft angiogram N/A 10/30/2013    Procedure: LEFT HEART CATHETERIZATION WITH Isabel Caprice;  Surgeon: Kathleene Hazel, MD;  Location: [facility] CATH LAB;  Service: Cardiovascular;  Laterality: N/A;  . Percutaneous coronary stent intervention (pci-s)  10/30/2013    Procedure: PERCUTANEOUS CORONARY STENT INTERVENTION (PCI-S);  Surgeon: Kathleene Hazel, MD;  Location: [facility] CATH LAB;  Service: Cardiovascular;;  . Cardiac catheterization N/A 05/27/2015    Procedure: Left Heart Cath and Coronary Angiography;  Surgeon: Kathleene Hazel,  MD;  Location: [facility] INVASIVE CV LAB;  Service: Cardiovascular;  Laterality: N/A;   Family History  Problem Relation Age of Onset  . Heart disease Mother   . Cancer Mother     ? type  . Diabetes Father   . Hyperlipidemia Father   . Hypertension Father   . Stomach cancer Father   . Heart disease Brother 12    X 5 brothers   Social History  Substance Use Topics  . Smoking status: Former Smoker -- 3.00 packs/day for 40 years    Types: Cigarettes    Quit date: 01/23/2003  . Smokeless tobacco: Never Used  . 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   Genitourinary: Negative for dysuria, frequency and discharge.  Musculoskeletal: Positive for myalgias (generalized).      Allergies  Sulfa antibiotics and Hydrocodone  Home Medications   Prior to Admission medications   Medication Sig Start Date End Date Taking? Authorizing Barrie Wale  aspirin EC 81 MG tablet Take 81 mg by mouth daily.   Yes Historical Gwenn Teodoro, MD  citalopram (CELEXA) 40 MG tablet Take 1 tablet (40 mg total) by mouth daily. 10/29/14  Yes Elenora Gamma, MD  clopidogrel (PLAVIX) 75 MG tablet TAKE 1 TABLET BY MOUTH ONCE A DAY. 05/24/15  Yes Kathee Delton, MD  furosemide (LASIX) 40 MG tablet Take 1 tablet (40 mg total) by mouth daily. 12/22/14  Yes Kathleene Hazel, MD  losartan (COZAAR) 50 MG tablet Take 1 tablet (50 mg total) by mouth daily. 10/27/14  Yes Elenora Gamma, MD  metoprolol succinate (TOPROL-XL) 25 MG 24 hr tablet Take 1 tablet (25 mg total) by mouth daily. Take with or immediately following a meal. 06/24/15  Yes Beatrice Lecher, PA-C  Multiple Vitamin (MULTIVITAMIN WITH MINERALS) TABS tablet Take 1 tablet by mouth daily.   Yes Historical Sansa Alkema, MD  pantoprazole (PROTONIX) 40 MG tablet Take 1 tablet (40 mg total) by mouth daily. 12/22/14  Yes Elenora Gamma, MD  potassium chloride (K-DUR,KLOR-CON) 20 MEQ tablet Take 1 tablet (20 mEq total) by mouth daily. 12/22/14 12/22/15 Yes Kathleene Hazel, MD  ranolazine (RANEXA) 500 MG 12 hr tablet Take 1 tablet (500 mg total) by mouth 2 (two) times daily. 06/24/15  Yes Beatrice Lecher, PA-C  ibuprofen (ADVIL,MOTRIN) 200 MG tablet Take 600 mg by mouth every 6 (six) hours as needed. pain    Historical Janella Rogala, MD  mometasone (NASONEX) 50 MCG/ACT nasal spray Place 2 sprays into Frank nose daily. Patient taking differently: Place 2 sprays into Frank nose daily as needed (allergies).  07/08/14   Elenora Gamma, MD  NITROSTAT 0.4 MG SL tablet DISSOLVE 1 TABLET UNDER TONGUE EVERY 5 MINUTES UP TO 15 MIN FOR CHESTPAIN. IF NO  RELIEF CALL 911. 06/06/15   Kathleene Hazel, MD  simvastatin (ZOCOR) 20 MG tablet Take one tablet on Mondays, Wednesdays, and Fridays 06/24/15   Beatrice Lecher, PA-C  tetrahydrozoline-zinc (VISINE-AC) 0.05-0.25 % ophthalmic solution Place 2 drops into both eyes 3 (three) times daily as needed (itching/burning).    Historical Oceana Walthall, MD   BP 123/74 mmHg  Pulse 63  Temp(Src) 98.1 F (36.7 C) (Axillary)  Resp 14  Ht  (1.676 m)  Wt 176 lb (79.833 kg)  BMI 28.42 kg/m2  SpO2 95% Physical Exam  Constitutional: He  is oriented to person, place, and time. He appears well-developed and well-nourished.  HENT:  Head: Normocephalic and atraumatic.  Eyes: Conjunctivae and EOM are normal. Pupils are equal, round, and reactive to light.  Neck: Normal range of motion. Neck supple.  Cardiovascular: Normal rate and regular rhythm.   Pulmonary/Chest: Effort normal and breath sounds normal.  Abdominal: Soft. Bowel sounds are normal.  Musculoskeletal: Normal range of motion.  Neurological: He is alert and oriented to person, place, and time.  Skin: Skin is warm and dry.  Psychiatric: He has a normal mood and affect. His behavior is normal.  Nursing note and vitals reviewed.   ED Course  Procedures (including critical care time)  DIAGNOSTIC STUDIES: Oxygen Saturation is 94% on RA, adequate by my interpretation.    COORDINATION OF CARE: 8:17 AM - Discussed treatment plan with pt at bedside which includes diagnostic tests, including a UA and blood tests. Pt verbalized understanding and agreed to plan.   10:14 AM - Lab results reviewed with pt and his wife. Will order CT scan. Pt and his wife verbalized understanding and agreed to plan.  Labs Review Labs Reviewed  CBC WITH DIFFERENTIAL/PLATELET - Abnormal; Notable for Frank following:    Monocytes Absolute 1.5 (*)    All other components within normal limits  BASIC METABOLIC PANEL - Abnormal; Notable for Frank following:    Glucose, Bld 122  (*)    BUN 26 (*)    Creatinine, Ser 1.90 (*)    GFR calc non Af Amer 37 (*)    GFR calc Af Amer 43 (*)    All other components within normal limits  URINALYSIS, ROUTINE W REFLEX MICROSCOPIC (NOT AT [facility]) - Abnormal; Notable for Frank following:    Bilirubin Urine MODERATE (*)    Urobilinogen, UA 2.0 (*)    All other components within normal limits  HEPATIC FUNCTION PANEL - Abnormal; Notable for Frank following:    Albumin 3.4 (*)    AST 89 (*)    ALT 160 (*)    Alkaline Phosphatase 448 (*)    Total Bilirubin 2.2 (*)    Bilirubin, Direct 1.4 (*)    All other components within normal limits  LIPASE, BLOOD - Abnormal; Notable for Frank following:    Lipase 21 (*)    All other components within normal limits  HEPATITIS PANEL, ACUTE  .thisvisit  Imaging Review Ct Abdomen Pelvis Wo Contrast  08/07/2015   CLINICAL DATA:  Flu-like symptoms, elevated liver enzymes  EXAM: CT ABDOMEN AND PELVIS WITHOUT CONTRAST  TECHNIQUE: Multidetector CT imaging of Frank abdomen and pelvis was performed following Frank standard protocol without IV contrast.  COMPARISON:  CT chest 12/07/2013  FINDINGS: Lung bases shows patchy nodular infiltrate bilaterally. Pneumonia cannot be excluded. Clinical correlation is necessary. Follow-up to resolution is recommended.  Sagittal images of Frank spine shows degenerative changes thoracolumbar spine. Unenhanced liver shows no biliary ductal dilatation. No calcified gallstones are noted within gallbladder. Unenhanced pancreas, spleen and adrenal glands are unremarkable. Small hiatal hernia. No aortic aneurysm. Atherosclerotic calcifications of abdominal aorta and iliac arteries.  Unenhanced kidneys are symmetrical in size. No nephrolithiasis. No hydronephrosis or hydroureter.  Oral contrast material was given to Frank patient. There is no small bowel obstruction. No ascites or free air. No adenopathy. Normal appendix. No pericecal inflammation.  Frank urinary bladder is unremarkable. Prostate  gland and seminal vesicles are unremarkable. Moderate stool is noted in descending colon and rectosigmoid colon. No distal colonic obstruction. No colitis  or diverticulitis. There is small right inguinal scrotal canal hernia containing fat measures 2.2 cm.  IMPRESSION: 1. Bilateral lung bases patchy nodular infiltrate. Multifocal pneumonia cannot be excluded. Follow-up to resolution is recommended. 2. No nephrolithiasis.  No hydronephrosis or hydroureter. 3. Normal appendix.  No pericecal inflammation. 4. No small bowel obstruction. 5. Moderate stool noted in distal colon. No colitis or diverticulitis.   Electronically Signed   By: Natasha Mead M.D.   On: 08/07/2015 12:57   I have personally reviewed and evaluated these images and lab results as part of my medical decision-making.   EKG Interpretation None      MDM   Final diagnoses:  Elevated liver enzymes  Renal insufficiency    Uncertain etiology of elevated liver and kidney function. Could be hepatitis. Patchy nodular infiltrate noted in Frank bilateral lung bases. Patient is nontoxic-appearing. Discuss with Dr. Irene Limbo.  Will consult.    Frank Hutching, MD 08/07/15 [phone]

## 2015-08-07 NOTE — ED Notes (Signed)
MD at bedside. 

## 2015-08-08 ENCOUNTER — Telehealth: Payer: Self-pay | Admitting: Cardiovascular Disease

## 2015-08-08 ENCOUNTER — Observation Stay (HOSPITAL_COMMUNITY): Payer: Medicare HMO

## 2015-08-08 DIAGNOSIS — I25708 Atherosclerosis of coronary artery bypass graft(s), unspecified, with other forms of angina pectoris: Secondary | ICD-10-CM | POA: Diagnosis not present

## 2015-08-08 DIAGNOSIS — N179 Acute kidney failure, unspecified: Secondary | ICD-10-CM | POA: Diagnosis not present

## 2015-08-08 DIAGNOSIS — R7989 Other specified abnormal findings of blood chemistry: Secondary | ICD-10-CM | POA: Diagnosis not present

## 2015-08-08 LAB — HEPATITIS PANEL, ACUTE
Hep A IgM: NEGATIVE
Hep B C IgM: NEGATIVE
Hepatitis B Surface Ag: NEGATIVE

## 2015-08-08 LAB — PROTIME-INR
INR: 1.13 (ref 0.00–1.49)
Prothrombin Time: 14.7 seconds (ref 11.6–15.2)

## 2015-08-08 LAB — COMPREHENSIVE METABOLIC PANEL
ALT: 123 U/L — AB (ref 17–63)
AST: 69 U/L — AB (ref 15–41)
Albumin: 3.1 g/dL — ABNORMAL LOW (ref 3.5–5.0)
Alkaline Phosphatase: 411 U/L — ABNORMAL HIGH (ref 38–126)
Anion gap: 8 (ref 5–15)
BUN: 15 mg/dL (ref 6–20)
CHLORIDE: 108 mmol/L (ref 101–111)
CO2: 21 mmol/L — AB (ref 22–32)
CREATININE: 1.13 mg/dL (ref 0.61–1.24)
Calcium: 8.1 mg/dL — ABNORMAL LOW (ref 8.9–10.3)
GFR calc non Af Amer: >60 mL/min (ref >60)
Glucose, Bld: 92 mg/dL (ref 65–99)
Potassium: 4.3 mmol/L (ref 3.5–5.1)
SODIUM: 137 mmol/L (ref 135–145)
Total Bilirubin: 2.5 mg/dL — ABNORMAL HIGH (ref 0.3–1.2)
Total Protein: 6.6 g/dL (ref 6.5–8.1)

## 2015-08-08 LAB — APTT: aPTT: 29 seconds (ref 24–37)

## 2015-08-08 LAB — CBC
HCT: 37.5 % — ABNORMAL LOW (ref 39.0–52.0)
Hemoglobin: 12.6 g/dL — ABNORMAL LOW (ref 13.0–17.0)
MCH: 29.7 pg (ref 26.0–34.0)
MCHC: 33.6 g/dL (ref 30.0–36.0)
MCV: 88.4 fL (ref 78.0–100.0)
PLATELETS: 192 10*3/uL (ref 150–400)
RBC: 4.24 MIL/uL (ref 4.22–5.81)
RDW: 14.6 % (ref 11.5–15.5)
WBC: 9.8 10*3/uL (ref 4.0–10.5)

## 2015-08-08 NOTE — Progress Notes (Signed)
Discharge instructions given, verbalized understanding, out in stable condition ambulatory with staff. 

## 2015-08-08 NOTE — Discharge Summary (Signed)
Physician Discharge Summary  Frank [name] ZOX:[id number] DOB: June 24, 1956 DOA: 08/07/2015  PCP: Mickie Hillier, MD  Admit date: 08/07/2015 Discharge date: 08/08/2015  Recommendations for Outpatient Follow-up:  1. Elevated LFTs, unclear etiology, see below 2. Acute hepatitis panel pending 3. Consider repeat CMP at follow-up.    Follow-up Information    Follow up with Mickie Hillier, MD.   Specialty:  Family Medicine   Why:  keep f/u scheduled for 9/23   Contact information:   [address] [place] [address] [phone]      Discharge Diagnoses:  1. Elevated LFTs, mixed hepatocellular and cholestatic pattern 2. AKI 3. CAD  Discharge Condition: improved Disposition: home  Diet recommendation: heart healthy, push fluisd  Filed Weights   08/07/15 0732 08/07/15 1659  Weight: 79.833 kg (176 lb) 79.47 kg (175 lb 3.2 oz)    History of present illness:  59 y.o. male with a history of CAD, GERD, and CABG x 4 in 2004 presented with fever (Tmax 101F), chills, generalized myalgias and dark urine. Labs revealed AKI, elevated alkaline phosphatase, AST and ALT, and elevated Tbili. Abdominal CT revealed bilateral patchy nodular infiltrates at the bases but was otherwise negative with unremarkable appearance of gallbladder and liver. Admitted for further management and treatment of possible CAP and hepatitis.  Hospital Course:  Treated with IVF overnight. Following day had normal renal function and improvement of LFTs except t bili which was without significant change. Adequate urine outpatient. Asymptomatic, no pain, both CT abd/pelvis and abd u/s without acute pathology and no signs or symptoms of biliary or abd pathology. Cold like symptoms recently suggest possiblity of hepatitis, panel pending. Has close f/u arranged, is tolerating diet and labs stable/improved.If LFTs don't resolve, consider further evaluation and GI consult as an outpatient.  1. Elevated LFTs, mixed hepatocellular  and cholestatic pattern, unclear etiology. Improving overall with stable t bili. On statin for some time. Cold type symptoms suggest possibility of acute hepatitis. No h/o hepatitis A or B vaccination. Abdominal CT unremarkable for liver or gallbladder abnormality. Abdominal US unremarkable. No RUQ or postprandial symptoms. No Tylenol use. No signs of infection. 2. AKI, superimposed on h/o normal renal function, resolved. Etiology unclear, no h/o CKD. Reports normal intake. On Lasix, Cozaar as outpatient. Reports minimal ibuprofen use. 3. CAD, s/p CABG, hypertension, HLD. Appears stable.   Push fluids, monitor for fever, decreased UOP, vomiting, pain, jaundice.  Stop statin, ibuprofen and temporarily hold losartan  Discussed with wife and wife's mother in detail at bedside  Discharge Instructions  Discharge Instructions    Activity as tolerated - No restrictions    Complete by:  As directed      Diet - low sodium heart healthy    Complete by:  As directed      Discharge instructions    Complete by:  As directed   Call your physician or seek immediate medical attention for fever, decreased urine output, vomiting, pain, jaundice/yellow skin or worsening of condition. Drink 2 liters of fluid per day.          Current Discharge Medication List    CONTINUE these medications which have NOT CHANGED   Details  aspirin EC 81 MG tablet Take 81 mg by mouth daily.    citalopram (CELEXA) 40 MG tablet Take 1 tablet (40 mg total) by mouth daily. Qty: 90 tablet, Refills: 3    clopidogrel (PLAVIX) 75 MG tablet TAKE 1 TABLET BY MOUTH ONCE A DAY. Qty: 90 tablet, Refills: 3  furosemide (LASIX) 40 MG tablet Take 1 tablet (40 mg total) by mouth daily. Qty: 90 tablet, Refills: 3    metoprolol succinate (TOPROL-XL) 25 MG 24 hr tablet Take 1 tablet (25 mg total) by mouth daily. Take with or immediately following a meal. Qty: 30 tablet, Refills: 3    Multiple Vitamin (MULTIVITAMIN WITH MINERALS)  TABS tablet Take 1 tablet by mouth daily.    pantoprazole (PROTONIX) 40 MG tablet Take 1 tablet (40 mg total) by mouth daily. Qty: 90 tablet, Refills: 3    potassium chloride (K-DUR,KLOR-CON) 20 MEQ tablet Take 1 tablet (20 mEq total) by mouth daily. Qty: 90 tablet, Refills: 3   Associated Diagnoses: Murmur, cardiac    ranolazine (RANEXA) 500 MG 12 hr tablet Take 1 tablet (500 mg total) by mouth 2 (two) times daily. Qty: 60 tablet, Refills: 11    mometasone (NASONEX) 50 MCG/ACT nasal spray Place 2 sprays into the nose daily. Qty: 17 g, Refills: 12   Associated Diagnoses: Cough    NITROSTAT 0.4 MG SL tablet DISSOLVE 1 TABLET UNDER TONGUE EVERY 5 MINUTES UP TO 15 MIN FOR CHESTPAIN. IF NO RELIEF CALL 911. Qty: 25 tablet, Refills: 6    tetrahydrozoline-zinc (VISINE-AC) 0.05-0.25 % ophthalmic solution Place 2 drops into both eyes 3 (three) times daily as needed (itching/burning).      STOP taking these medications     losartan (COZAAR) 50 MG tablet      ibuprofen (ADVIL,MOTRIN) 200 MG tablet      simvastatin (ZOCOR) 20 MG tablet        Allergies  Allergen Reactions  . Sulfa Antibiotics Rash  . Hydrocodone Hives and Itching    The results of significant diagnostics from this hospitalization (including imaging, microbiology, ancillary and laboratory) are listed below for reference.    Significant Diagnostic Studies: Ct Abdomen Pelvis Wo Contrast  08/07/2015   CLINICAL DATA:  Flu-like symptoms, elevated liver enzymes  EXAM: CT ABDOMEN AND PELVIS WITHOUT CONTRAST  TECHNIQUE: Multidetector CT imaging of the abdomen and pelvis was performed following the standard protocol without IV contrast.  COMPARISON:  CT chest 12/07/2013  FINDINGS: Lung bases shows patchy nodular infiltrate bilaterally. Pneumonia cannot be excluded. Clinical correlation is necessary. Follow-up to resolution is recommended.  Sagittal images of the spine shows degenerative changes thoracolumbar spine. Unenhanced  liver shows no biliary ductal dilatation. No calcified gallstones are noted within gallbladder. Unenhanced pancreas, spleen and adrenal glands are unremarkable. Small hiatal hernia. No aortic aneurysm. Atherosclerotic calcifications of abdominal aorta and iliac arteries.  Unenhanced kidneys are symmetrical in size. No nephrolithiasis. No hydronephrosis or hydroureter.  Oral contrast material was given to the patient. There is no small bowel obstruction. No ascites or free air. No adenopathy. Normal appendix. No pericecal inflammation.  The urinary bladder is unremarkable. Prostate gland and seminal vesicles are unremarkable. Moderate stool is noted in descending colon and rectosigmoid colon. No distal colonic obstruction. No colitis or diverticulitis. There is small right inguinal scrotal canal hernia containing fat measures 2.2 cm.  IMPRESSION: 1. Bilateral lung bases patchy nodular infiltrate. Multifocal pneumonia cannot be excluded. Follow-up to resolution is recommended. 2. No nephrolithiasis.  No hydronephrosis or hydroureter. 3. Normal appendix.  No pericecal inflammation. 4. No small bowel obstruction. 5. Moderate stool noted in distal colon. No colitis or diverticulitis.   Electronically Signed   By: Natasha Mead M.D.   On: 08/07/2015 12:57   US Abdomen Limited Ruq  08/08/2015   CLINICAL DATA:  Elevated LFTs.  Pain.  EXAM: US ABDOMEN LIMITED - RIGHT UPPER QUADRANT  COMPARISON:  CT 08/07/2015 .  FINDINGS: Gallbladder:  No gallstones or wall thickening visualized. No sonographic Murphy sign noted.  Common bile duct:  Diameter: 2.9 mm  Liver:  No focal lesion identified. Within normal limits in parenchymal echogenicity.  IMPRESSION: Negative exam.   Electronically Signed   By: Maisie Fus  Register   On: 08/08/2015 07:55    Microbiology: No results found for this or any previous visit (from the past 240 hour(s)).   Labs: Basic Metabolic Panel:  Recent Labs Lab 08/07/15 0753 08/08/15 0709  NA 138 137    K 4.4 4.3  CL 104 108  CO2 23 21*  GLUCOSE 122* 92  BUN 26* 15  CREATININE 1.90* 1.13  CALCIUM 9.2 8.1*   Liver Function Tests:  Recent Labs Lab 08/07/15 0753 08/08/15 0709  AST 89* 69*  ALT 160* 123*  ALKPHOS 448* 411*  BILITOT 2.2* 2.5*  PROT 7.3 6.6  ALBUMIN 3.4* 3.1*    Recent Labs Lab 08/07/15 0753  LIPASE 21*   CBC:  Recent Labs Lab 08/07/15 0753 08/08/15 0709  WBC 9.6 9.8  NEUTROABS 5.8  --   HGB 13.5 12.6*  HCT 39.5 37.5*  MCV 88.4 88.4  PLT 198 192   Principal Problem:   Elevated LFTs Active Problems:   CAD   AKI (acute kidney injury)   Time coordinating discharge: 35 minutes  Signed:  Brendia Sacks, MD [facility] 08/08/2015, 4:45 PM

## 2015-08-08 NOTE — Plan of Care (Signed)
Problem: Phase I Progression Outcomes Goal: Pain controlled with appropriate interventions Outcome: Completed/Met Date Met:  08/08/15 Pt denies pain Goal: OOB as tolerated unless otherwise ordered Outcome: Progressing Pt has been out of bed and ambulating in room.

## 2015-08-08 NOTE — Care Management Note (Signed)
Case Management Note  Patient Details  Name: Frank [name] MRN: [id number] Date of Birth: 03-Sep-1956  Subjective/Objective:                  Pt admitted from home with abd pain and eleveted liver enzymes. Pt lives with his wife and will return home at discharge. Pt is independent with ADL's.  Action/Plan: Anticipate discharge within 24 hours. No Cm needs noted.  Expected Discharge Date:                  Expected Discharge Plan:  Home/Self Care  In-House Referral:  NA  Discharge planning Services  CM Consult  Post Acute Care Choice:  NA Choice offered to:  NA  DME Arranged:    DME Agency:     HH Arranged:    HH Agency:     Status of Service:  Completed, signed off  Medicare Important Message Given:    Date Medicare IM Given:    Medicare IM give by:    Date Additional Medicare IM Given:    Additional Medicare Important Message give by:     If discussed at Long Length of Stay Meetings, dates discussed:    Additional Comments:  Cheryl Flash, RN 08/08/2015, 11:46 AM

## 2015-08-08 NOTE — Telephone Encounter (Signed)
New message      Frank [name] is in the hosp at [facility].  They wanted Dr Clifton James to know

## 2015-08-08 NOTE — Progress Notes (Signed)
PROGRESS NOTE  RODY KEADLE ZOX:[id number] DOB: [birth year] DOA: 08/07/2015 PCP: Mickie Hillier, MD  Summary: 59 y.o. male with a history of CAD, GERD, and CABG x 4 in 2004 presented with fever (Tmax 101F), chills, generalized myalgias and dark urine. Labs revealed AKI, elevated alkaline phosphatase, AST and ALT, and elevated Tbili. Abdominal CT revealed bilateral patchy nodular infiltrates at the bases but was otherwise negative with unremarkable appearance of gallbladder and liver. Admitted for further management and treatment of possible CAP and hepatits.   Assessment/Plan: 1. Elevated LFTs, mixed hepatocellular and cholestatic pattern, unclear etiology. Improving overall with stable t bili. On statin for some time. Cold type symptoms suggest possibility of acute hepatitis. No h/o hepatitis A or B vaccination. Abdominal CT unremarkable for liver or gallbladder abnormality. Abdominal US unremarkable. No RUQ or postprandial symptoms. No Tylenol use. No signs of infection. 2. AKI, superimposed on h/o normal renal function, resolved. Etiology unclear, no h/o CKD. Reports normal intake. On Lasix, Cozaar as outpatient. Reports minimal ibuprofen use. 3. CAD, s/p CABG, hypertension, HLD. Appears stable.   Dramatically improved symptomatically, eating well, no n/v. Renal fxn has normalized. LFTs improved/stable. Wants to go home, given clinical improvement, plan home today, has outpatient f/u with PCP 9/23. In meantime push fluids, monitor for fever, decreased UOP, vomiting, pain, jaundice.  Stop statin  Discussed with wife and wife's mother in detail at bedside   Brendia Sacks, MD  [facility]  Pager [phone] If 7PM-7AM, please contact night-coverage at [email or web address], password [facility] 08/08/2015, 7:21 AM    Consultants:    Procedures:    Antibiotics:    HPI/Subjective: Feeling "great". No pain, no n/v. Eating well. Urinating without problem. Wants to go home.  Objective: Filed  Vitals:   08/07/15 1659 08/07/15 2106 08/08/15 0509 08/08/15 0512  BP: 125/78 116/64 119/97 116/66  Pulse: 88 86 78 67  Temp: 98.4 F (36.9 C) 99.4 F (37.4 C) 98 F (36.7 C)   TempSrc: Oral Oral Oral   Resp: 18 18    Height:  (1.676 m)     Weight: 79.47 kg (175 lb 3.2 oz)     SpO2: 98% 94% 96%     Intake/Output Summary (Last 24 hours) at 08/08/15 0721 Last data filed at 08/08/15 0600  Gross per 24 hour  Intake   1870 ml  Output   1150 ml  Net    720 ml     Filed Weights   08/07/15 0732 08/07/15 1659  Weight: 79.833 kg (176 lb) 79.47 kg (175 lb 3.2 oz)    Exam: VSS, not hypoxic, afebrile  General:  Appears calm and comfortable Cardiovascular: RRR, no m/r/g. No LE edema. Respiratory: CTA bilaterally, no w/r/r. Normal respiratory effort. Abdomen: soft, ntnd, no RUQ pain Psychiatric: grossly normal mood and affect, speech fluent and appropriate  New data reviewed:  BUN and creatinine has normalized  UOP 1150  AP 411, improved  AST 69, improving  ALT 123, improving  T BILI 2.5, increased slightly  INR, PTT WNL  Influenza screen negative  WBC WNL  Pertinent data since admission:  Abdominal CT IMPRESSION: 1. Bilateral lung bases patchy nodular infiltrate. Multifocal pneumonia cannot be excluded. Follow-up to resolution is recommended. 2. No nephrolithiasis. No hydronephrosis or hydroureter. 3. Normal appendix. No pericecal inflammation. 4. No small bowel obstruction. 5. Moderate stool noted in distal colon. No colitis or Diverticulitis.  Abdominal US IMPRESSION: Negative exam.  Pending data:  Acute hepatitis panel  Screening HIV  Scheduled  Meds: . aspirin EC  81 mg Oral Daily  . citalopram  40 mg Oral Daily  . clopidogrel  75 mg Oral Daily  . Influenza vac split quadrivalent PF  0.5 mL Intramuscular Tomorrow-1000  . metoprolol succinate  25 mg Oral Daily  . pantoprazole  40 mg Oral Daily  . ranolazine  500 mg Oral BID    Continuous Infusions: . sodium chloride 150 mL/hr at 08/08/15 8657    Principal Problem:   Elevated LFTs Active Problems:   CAD   AKI (acute kidney injury)   By signing my name below, I, Burnett Harry attest that this documentation has been prepared under the direction and in the presence of Brendia Sacks, MD Electronically signed: Burnett Harry, Scribe. 08/08/2015  I personally performed the services described in this documentation. All medical record entries made by the scribe were at my direction. I have reviewed the chart and agree that the record reflects my personal performance and is accurate and complete. Brendia Sacks, MD

## 2015-08-08 NOTE — Telephone Encounter (Signed)
Thanks. cdm 

## 2015-08-09 ENCOUNTER — Telehealth: Payer: Self-pay | Admitting: Cardiovascular Disease

## 2015-08-09 LAB — HIV ANTIBODY (ROUTINE TESTING W REFLEX): HIV Screen 4th Generation wRfx: NONREACTIVE

## 2015-08-09 NOTE — Telephone Encounter (Signed)
Called Patients mother-in-law per her request.  Advised that she is not on the patients Information Release Form.  She gave me her daughters number to call and thanked me for returning the call.      Called patients wife.  She and the patient had questions about his medications that were d/c'd at his recent hospital d/c.   Patient had concentrated urine along with elevated LFT's.  Was rehydrated and taken off of Ibuprofen, losartan and simvastatin.  Wanted to know if this is all right with Dr. Clifton James.  He will be seeing his PCP Dr Mickie Hillier on 08-12-15.

## 2015-08-09 NOTE — Telephone Encounter (Signed)
I would have him hold both the Losartan and Zocor for now. If renal function is stable and LFTs normal on repeat labs in primary care this week, would restart both. Thanks, chris

## 2015-08-09 NOTE — Telephone Encounter (Signed)
LMTCB @ 11:13am

## 2015-08-09 NOTE — Telephone Encounter (Signed)
New message      Pt was discharged from [facility].  They have questions.  Please call

## 2015-08-10 NOTE — Telephone Encounter (Signed)
New message   Pt wife is calling returning RN call

## 2015-08-10 NOTE — Telephone Encounter (Signed)
Spoke with wife and informed her of information provided by Dr. Clifton James. Wife verbalized understanding and was in agreement with this plan. Advised wife to let us know if PCP does not draw labs and we will see if Dr. Clifton James wants to have them drawn here.

## 2015-08-10 NOTE — Telephone Encounter (Signed)
Left message to call back  

## 2015-08-12 ENCOUNTER — Encounter: Payer: Self-pay | Admitting: Family Medicine

## 2015-08-12 ENCOUNTER — Ambulatory Visit (INDEPENDENT_AMBULATORY_CARE_PROVIDER_SITE_OTHER): Payer: Medicare HMO | Admitting: Family Medicine

## 2015-08-12 VITALS — BP 137/77 | HR 73 | Temp 98.1°F | Ht 66.0 in | Wt 172.0 lb

## 2015-08-12 DIAGNOSIS — R011 Cardiac murmur, unspecified: Secondary | ICD-10-CM | POA: Diagnosis not present

## 2015-08-12 DIAGNOSIS — N179 Acute kidney failure, unspecified: Secondary | ICD-10-CM

## 2015-08-12 LAB — POCT URINALYSIS DIPSTICK
BILIRUBIN UA: NEGATIVE
Blood, UA: NEGATIVE
Glucose, UA: NEGATIVE
KETONES UA: NEGATIVE
LEUKOCYTES UA: NEGATIVE
NITRITE UA: NEGATIVE
PROTEIN UA: NEGATIVE
Spec Grav, UA: 1.015
Urobilinogen, UA: 1
pH, UA: 6

## 2015-08-12 LAB — CBC
HCT: 44.4 % (ref 39.0–52.0)
Hemoglobin: 14.5 g/dL (ref 13.0–17.0)
MCH: 28.7 pg (ref 26.0–34.0)
MCHC: 32.7 g/dL (ref 30.0–36.0)
MCV: 87.7 fL (ref 78.0–100.0)
MPV: 10.2 fL (ref 8.6–12.4)
PLATELETS: 357 10*3/uL (ref 150–400)
RBC: 5.06 MIL/uL (ref 4.22–5.81)
RDW: 14.4 % (ref 11.5–15.5)
WBC: 10.7 10*3/uL — ABNORMAL HIGH (ref 4.0–10.5)

## 2015-08-12 LAB — COMPREHENSIVE METABOLIC PANEL
ALT: 159 U/L — ABNORMAL HIGH (ref 9–46)
AST: 98 U/L — ABNORMAL HIGH (ref 10–35)
Albumin: 4 g/dL (ref 3.6–5.1)
Alkaline Phosphatase: 719 U/L — ABNORMAL HIGH (ref 40–115)
BUN: 18 mg/dL (ref 7–25)
CALCIUM: 9.6 mg/dL (ref 8.6–10.3)
CHLORIDE: 99 mmol/L (ref 98–110)
CO2: 23 mmol/L (ref 20–31)
Creat: 1.26 mg/dL — ABNORMAL HIGH (ref 0.70–1.25)
GLUCOSE: 107 mg/dL — AB (ref 65–99)
POTASSIUM: 4.4 mmol/L (ref 3.5–5.3)
Sodium: 135 mmol/L (ref 135–146)
Total Bilirubin: 2.3 mg/dL — ABNORMAL HIGH (ref 0.2–1.2)
Total Protein: 7.2 g/dL (ref 6.1–8.1)

## 2015-08-12 MED ORDER — FUROSEMIDE 40 MG PO TABS: 20.0000 mg | ORAL_TABLET | Freq: Every day | ORAL | Status: DC

## 2015-08-12 NOTE — Assessment & Plan Note (Signed)
Patient had responded well to IVF in the hospital, but is having recurrent darkened urine. No swelling. I believe that he may be getting over-diuresed via his Lasix.  - continue holding Simvastatin, lisinopril, and ibuprofen. - Decrease Lasix dosing to  a day (from ). - I have ordered some labs to be drawn today: CMP, CBC, and a UA - Goal of drinking 6-8 bottles of water a day.   - If swelling develops; he is to contact our office. (I will probably increase lasix dose back to  at that time).

## 2015-08-12 NOTE — Progress Notes (Signed)
   HPI  CC: hospital f/u; dark urine. Patient is here to meet his new PCP and for a hospital f/u. He was recently [facility] from the hospital for concerns about abnormally dark urine. Findings were consistent w/ AKI and he also had some elevated LFTs. According to the patient he responded well to IVFs. The inpatient team had him stop taking ibuprofen, his statin and his lisinopril. He reports having relatively clear urine at DC but since that time his urine has become dark again. He has not been drinking the amount of water that was recommended to him at the hospital. He would like to know what he can do about this.   ROS: denies SOB, CP, swelling, hematuria, dysuria, flank pain, fever, chills, fatigue, HA, dizziness.  Past medical history and social history reviewed and updated in the EMR as appropriate.  Objective: BP 137/77 mmHg  Pulse 73  Temp(Src) 98.1 F (36.7 C) (Oral)  Ht  (1.676 m)  Wt 172 lb (78.019 kg)  BMI 27.77 kg/m2 Gen: NAD, alert, cooperative, and pleasant. HEENT: NCAT, EOMI, PERRL CV: RRR, murmur appreciated at left 5th intercostal.  Resp: some basilar crackles noted at left base, no wheezes, non-labored Abd: SNTND, BS present, no guarding or organomegaly Ext: No edema, warm Neuro: Alert and oriented, Speech clear, No gross deficits  Assessment and plan:  AKI (acute kidney injury) Patient had responded well to IVF in the hospital, but is having recurrent darkened urine. No swelling. I believe that he may be getting over-diuresed via his Lasix.  - continue holding Simvastatin, lisinopril, and ibuprofen. - Decrease Lasix dosing to  a day (from ). - I have ordered some labs to be drawn today: CMP, CBC, and a UA - Goal of drinking 6-8 bottles of water a day.   - If swelling develops; he is to contact our office. (I will probably increase lasix dose back to  at that time).    Orders Placed This Encounter  Procedures  . CBC  . Comprehensive metabolic  panel  . POCT urinalysis dipstick    Meds ordered this encounter  Medications  . furosemide (LASIX) 40 MG tablet    Sig: Take 0.5 tablets (20 mg total) by mouth daily.    Dispense:  90 tablet    Refill:  3     Kathee Delton, MD,MS,  PGY2 08/12/2015 9:12 PM

## 2015-08-12 NOTE — Patient Instructions (Signed)
It was a pleasure seeing you today in our clinic. Today we discussed your hospital stay. Here is the treatment plan we have discussed and agreed upon together:   - I would continue to STOP taking the Simvastatin, lisinopril, and ibuprofen. - I would like for you to decrease your Lasix dosing to  a day (from ). - I have ordered some labs to be drawn today: CMP, CBC, and a UA  - I will contact you with any concerning results, otherwise I will mail you these. - I would like for you to have a goal of drinking 6-8 bottles of water a day. If you notice any swelling developing in your feet or ankles please contact our office to let me know. (I will probably ask you to increase your lasix dose back to  at that time). - If you develop any shortness of breath or Chest Pain then you should report to the nearest ED and inform them of the recent changes I made to your medications. - Taking  of Tylenol a day should be safe for you at this time. If you develop any right sided abdominal pain I would like to know this.

## 2015-08-15 ENCOUNTER — Encounter: Payer: Self-pay | Admitting: Family Medicine

## 2015-08-15 ENCOUNTER — Telehealth: Payer: Self-pay | Admitting: Family Medicine

## 2015-08-15 NOTE — Telephone Encounter (Signed)
Wife returned dr Ocie Doyne phone. Please call [phone]

## 2015-08-16 ENCOUNTER — Emergency Department (HOSPITAL_COMMUNITY)
Admission: EM | Admit: 2015-08-16 | Discharge: 2015-08-16 | Disposition: A | Discharge status: Home / Self Care | Payer: Medicare HMO | Attending: Physician Assistant | Admitting: Physician Assistant

## 2015-08-16 ENCOUNTER — Encounter (HOSPITAL_COMMUNITY): Payer: Self-pay | Admitting: Emergency Medicine

## 2015-08-16 DIAGNOSIS — Z7902 Long term (current) use of antithrombotics/antiplatelets: Secondary | ICD-10-CM | POA: Diagnosis not present

## 2015-08-16 DIAGNOSIS — Z7982 Long term (current) use of aspirin: Secondary | ICD-10-CM | POA: Insufficient documentation

## 2015-08-16 DIAGNOSIS — M1990 Unspecified osteoarthritis, unspecified site: Secondary | ICD-10-CM | POA: Insufficient documentation

## 2015-08-16 DIAGNOSIS — R7989 Other specified abnormal findings of blood chemistry: Secondary | ICD-10-CM | POA: Diagnosis not present

## 2015-08-16 DIAGNOSIS — I252 Old myocardial infarction: Secondary | ICD-10-CM | POA: Diagnosis not present

## 2015-08-16 DIAGNOSIS — I2510 Atherosclerotic heart disease of native coronary artery without angina pectoris: Secondary | ICD-10-CM | POA: Diagnosis not present

## 2015-08-16 DIAGNOSIS — F329 Major depressive disorder, single episode, unspecified: Secondary | ICD-10-CM | POA: Diagnosis not present

## 2015-08-16 DIAGNOSIS — I509 Heart failure, unspecified: Secondary | ICD-10-CM | POA: Insufficient documentation

## 2015-08-16 DIAGNOSIS — K219 Gastro-esophageal reflux disease without esophagitis: Secondary | ICD-10-CM | POA: Insufficient documentation

## 2015-08-16 DIAGNOSIS — Z79899 Other long term (current) drug therapy: Secondary | ICD-10-CM | POA: Diagnosis not present

## 2015-08-16 DIAGNOSIS — F488 Other specified nonpsychotic mental disorders: Secondary | ICD-10-CM | POA: Insufficient documentation

## 2015-08-16 DIAGNOSIS — G8929 Other chronic pain: Secondary | ICD-10-CM | POA: Insufficient documentation

## 2015-08-16 DIAGNOSIS — I10 Essential (primary) hypertension: Secondary | ICD-10-CM | POA: Diagnosis not present

## 2015-08-16 DIAGNOSIS — Z87891 Personal history of nicotine dependence: Secondary | ICD-10-CM | POA: Diagnosis not present

## 2015-08-16 DIAGNOSIS — R945 Abnormal results of liver function studies: Secondary | ICD-10-CM

## 2015-08-16 LAB — URINALYSIS, ROUTINE W REFLEX MICROSCOPIC
GLUCOSE, UA: NEGATIVE mg/dL
Hgb urine dipstick: NEGATIVE
KETONES UR: NEGATIVE mg/dL
Leukocytes, UA: NEGATIVE
Nitrite: NEGATIVE
Protein, ur: NEGATIVE mg/dL
Specific Gravity, Urine: 1.021 (ref 1.005–1.030)
Urobilinogen, UA: 1 mg/dL (ref 0.0–1.0)
pH: 6 (ref 5.0–8.0)

## 2015-08-16 LAB — CBC
HCT: 38.7 % — ABNORMAL LOW (ref 39.0–52.0)
Hemoglobin: 12.8 g/dL — ABNORMAL LOW (ref 13.0–17.0)
MCH: 29 pg (ref 26.0–34.0)
MCHC: 33.1 g/dL (ref 30.0–36.0)
MCV: 87.6 fL (ref 78.0–100.0)
PLATELETS: 342 10*3/uL (ref 150–400)
RBC: 4.42 MIL/uL (ref 4.22–5.81)
RDW: 15 % (ref 11.5–15.5)
WBC: 8.5 10*3/uL (ref 4.0–10.5)

## 2015-08-16 LAB — COMPREHENSIVE METABOLIC PANEL
ALT: 133 U/L — AB (ref 17–63)
AST: 78 U/L — AB (ref 15–41)
Albumin: 3.2 g/dL — ABNORMAL LOW (ref 3.5–5.0)
Alkaline Phosphatase: 542 U/L — ABNORMAL HIGH (ref 38–126)
Anion gap: 10 (ref 5–15)
BUN: 14 mg/dL (ref 6–20)
CHLORIDE: 103 mmol/L (ref 101–111)
CO2: 24 mmol/L (ref 22–32)
CREATININE: 1.24 mg/dL (ref 0.61–1.24)
Calcium: 9.5 mg/dL (ref 8.9–10.3)
GFR calc Af Amer: >60 mL/min (ref >60)
Glucose, Bld: 139 mg/dL — ABNORMAL HIGH (ref 65–99)
Potassium: 3.9 mmol/L (ref 3.5–5.1)
SODIUM: 137 mmol/L (ref 135–145)
Total Bilirubin: 0.9 mg/dL (ref 0.3–1.2)
Total Protein: 6.5 g/dL (ref 6.5–8.1)

## 2015-08-16 LAB — LIPASE, BLOOD: LIPASE: 31 U/L (ref 22–51)

## 2015-08-16 NOTE — ED Notes (Signed)
Pt sts here for abnormal kidney and liver levels; pt admitted recently for same at [facility]; pt sts some fatigue

## 2015-08-16 NOTE — Telephone Encounter (Signed)
Wife calls again, would like a return call today at some point.

## 2015-08-16 NOTE — ED Provider Notes (Signed)
CSN: [id number]     Arrival date & time 08/16/15  1829 History   First MD Initiated Contact with Patient 08/16/15 2034     Chief Complaint  Patient presents with  . Abnormal Lab    Patient is a 59 y.o. male presenting with general illness.  Illness Location:  Generalized fatigue Severity:  Mild Onset quality:  Gradual Progression:  Partially resolved Associated symptoms: fatigue   Associated symptoms: no abdominal pain, no chest pain, no congestion, no cough, no fever, no headaches, no loss of consciousness and no nausea       Past Medical History  Diagnosis Date  . Mitral regurgitation     Moderate by TEE 03/27/12  . Low HDL (under 40)   . GERD (gastroesophageal reflux disease)   . Hypertension   . CAD (coronary artery disease)     5V CABG in 2004; stent to RCA x1 and SVG to acute marginal x2 2011;  PTCA/DES x 2 body of SVG to PDA  05/20/12  . CHF (congestive heart failure)      EF 55% by echo 03/27/2012  . Depression   . Numbness and tingling in hands   . Numbness and tingling of both legs   . Umbilical hernia     unrepaired (05/20/12)  . Arthritis     hands, shoulders, arms  . Chronic lower back pain     "w/activity"  . Anxiety   . HLD (hyperlipidemia)   . Myocardial infarction 2004   Past Surgical History  Procedure Laterality Date  . Tee without cardioversion  03/27/2012    Procedure: TRANSESOPHAGEAL ECHOCARDIOGRAM (TEE);  Surgeon: Laurey Morale, MD;  Location: [facility] ENDOSCOPY;  Service: Cardiovascular;  Laterality: N/A;  to be done at 1100  . Coronary artery bypass graft  2004    5v CABG   . Incision and drainage of wound  2010    "from bite; maybe snake or spider; almost lost LLE"  . Ptca  10/30/2013    DES  SVG    . Coronary angioplasty with stent placement  05/11    stent to RCA x 1 and SVG-acute marginal x2. EF normal  . Coronary angioplasty with stent placement  05/20/12    PTCA/DES x 2 body of SVG to PDA   . Left heart catheterization with coronary/graft  angiogram N/A 05/20/2012    Procedure: LEFT HEART CATHETERIZATION WITH Isabel Caprice;  Surgeon: Kathleene Hazel, MD;  Location: [facility] CATH LAB;  Service: Cardiovascular;  Laterality: N/A;  . Left heart catheterization with coronary/graft angiogram N/A 10/30/2013    Procedure: LEFT HEART CATHETERIZATION WITH Isabel Caprice;  Surgeon: Kathleene Hazel, MD;  Location: [facility] CATH LAB;  Service: Cardiovascular;  Laterality: N/A;  . Percutaneous coronary stent intervention (pci-s)  10/30/2013    Procedure: PERCUTANEOUS CORONARY STENT INTERVENTION (PCI-S);  Surgeon: Kathleene Hazel, MD;  Location: [facility] CATH LAB;  Service: Cardiovascular;;  . Cardiac catheterization N/A 05/27/2015    Procedure: Left Heart Cath and Coronary Angiography;  Surgeon: Kathleene Hazel, MD;  Location: [facility] INVASIVE CV LAB;  Service: Cardiovascular;  Laterality: N/A;   Family History  Problem Relation Age of Onset  . Heart disease Mother   . Cancer Mother     ? type  . Diabetes Father   . Hyperlipidemia Father   . Hypertension Father   . Stomach cancer Father   . Heart disease Brother 20    X 5 brothers   Social History  Substance Use  Topics  . Smoking status: Former Smoker -- 3.00 packs/day for 40 years    Types: Cigarettes    Quit date: 01/23/2003  . Smokeless tobacco: Never Used  . 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 Negative for fever.  HENT: Negative for congestion.   Respiratory: Negative for cough.        Chronic SOB  Cardiovascular: Negative for chest pain and leg swelling.  Gastrointestinal: Negative for nausea and abdominal pain.  Neurological: Negative for loss of consciousness and headaches.  All other systems reviewed and are negative.     Allergies  Sulfa antibiotics and Hydrocodone  Home Medications   Prior to Admission  medications   Medication Sig Start Date End Date Taking? Authorizing Afia Messenger  aspirin EC 81 MG tablet Take 81 mg by mouth daily.    Historical Cyle Kenyon, MD  citalopram (CELEXA) 40 MG tablet Take 1 tablet (40 mg total) by mouth daily. 10/29/14   Elenora Gamma, MD  clopidogrel (PLAVIX) 75 MG tablet TAKE 1 TABLET BY MOUTH ONCE A DAY. 05/24/15   Kathee Delton, MD  furosemide (LASIX) 40 MG tablet Take 0.5 tablets (20 mg total) by mouth daily. 08/12/15   Kathee Delton, MD  metoprolol succinate (TOPROL-XL) 25 MG 24 hr tablet Take 1 tablet (25 mg total) by mouth daily. Take with or immediately following a meal. 06/24/15   Beatrice Lecher, PA-C  mometasone (NASONEX) 50 MCG/ACT nasal spray Place 2 sprays into the nose daily. Patient taking differently: Place 2 sprays into the nose daily as needed (allergies).  07/08/14   Elenora Gamma, MD  Multiple Vitamin (MULTIVITAMIN WITH MINERALS) TABS tablet Take 1 tablet by mouth daily.    Historical Latissa Frick, MD  NITROSTAT 0.4 MG SL tablet DISSOLVE 1 TABLET UNDER TONGUE EVERY 5 MINUTES UP TO 15 MIN FOR CHESTPAIN. IF NO RELIEF CALL 911. 06/06/15   Kathleene Hazel, MD  pantoprazole (PROTONIX) 40 MG tablet Take 1 tablet (40 mg total) by mouth daily. 12/22/14   Elenora Gamma, MD  potassium chloride (K-DUR,KLOR-CON) 20 MEQ tablet Take 1 tablet (20 mEq total) by mouth daily. 12/22/14 12/22/15  Kathleene Hazel, MD  ranolazine (RANEXA) 500 MG 12 hr tablet Take 1 tablet (500 mg total) by mouth 2 (two) times daily. 06/24/15   Beatrice Lecher, PA-C  tetrahydrozoline-zinc (VISINE-AC) 0.05-0.25 % ophthalmic solution Place 2 drops into both eyes 3 (three) times daily as needed (itching/burning).    Historical Jazma Pickel, MD   BP 118/82 mmHg  Pulse 66  Temp(Src) 97.7 F (36.5 C) (Oral)  Resp 18  SpO2 96% Physical Exam  Constitutional: He is oriented to person, place, and time. He appears well-developed and well-nourished. No distress.  HENT:  Head: Normocephalic.   Eyes: Pupils are equal, round, and reactive to light.  Neck: Normal range of motion. No JVD present.  Cardiovascular: Normal rate.   Pulmonary/Chest: Effort normal. No respiratory distress. He has no wheezes. He has no rales.  Abdominal: Soft. He exhibits no distension. There is no tenderness. There is no rebound and no guarding.  Musculoskeletal: Normal range of motion. He exhibits no edema.  Neurological: He is alert and oriented to person, place, and time. No cranial nerve deficit.  Skin: Skin is warm. No rash noted. He is not diaphoretic. No erythema.  Nursing note and vitals reviewed.  ED Course  Procedures (including critical care time) Labs Review Labs Reviewed  COMPREHENSIVE METABOLIC PANEL - Abnormal; Notable for the following:    Glucose, Bld 139 (*)    Albumin 3.2 (*)    AST 78 (*)    ALT 133 (*)    Alkaline Phosphatase 542 (*)    All other components within normal limits  CBC - Abnormal; Notable for the following:    Hemoglobin 12.8 (*)    HCT 38.7 (*)    All other components within normal limits  URINALYSIS, ROUTINE W REFLEX MICROSCOPIC (NOT AT [facility]) - Abnormal; Notable for the following:    Color, Urine AMBER (*)    Bilirubin Urine SMALL (*)    All other components within normal limits  LIPASE, BLOOD    MDM   This patient presents emergency department today with abnormal lab and that was referred over here by his primary care physician. Patient has had a transaminitis over the past few weeks with unknown etiology. He states that initially he was feeling more fatigue during this time but that he has had a gradual resolution of this after his discharge from hospital almost a week ago. Patient has had no other signs or symptoms of worsening her chest pain shortness of breath. He does have a history of congestive heart failure reportedly and has had no increase in swelling in his legs no increase in shortness of breath from his chronic state and has no new oxygen  requirement.   During the hospital stay he had a hepatitis panel which was negative as well as a CT and ultrasound that were unremarkable. Patient will more than likely need a GI follow-up. We consulted family medicine who agreed with this plan and will have close follow-up with him on Friday. Patient was in agreement with this plan and would like to go home. At this time it appears that his transaminitis may be improving as well as the patient states that he is improving with his general fatigue. Patient was discharged home in good health and given strict cautions.  Final diagnoses:  Elevated LFTs  Psychogenic general fatigue    Deirdre Peer, MD 08/17/15 0040  Courteney Randall An, MD 08/18/15 1501

## 2015-08-16 NOTE — Telephone Encounter (Signed)
Called patient to discuss lab results. He asked that I discuss these with his wife. His wife and I had a long discussion about the increasing creatinine and bilirubin from his CMP. She states that he has been very diligent about keeping his fluids up with water, however his urine continues to be very dark/brown in color. He has also been taking frequent naps and reporting constant fatigue which is abnormal for him.  After our discussion I believe that it is important to have Frank [name] seen in the ED for further workup. My initial concern was for any new medications male placed on. Ranexa is the only medication that fits this role. I discussed patient's case with our pharmacist who does not believe that Ranexa is likely to be playing a role. My next concern is for patients history of heart disease and CHF. I would note that I appreciated a murmur at our last visit, this was also my first time examining this patient, if this murmur happens to be new and this could be causing the fatigue however the etiology behind his urine discoloration is still unknown.

## 2015-08-16 NOTE — ED Notes (Signed)
Pt left with all belongings and ambulated out of treatment area with family.  

## 2015-08-17 ENCOUNTER — Telehealth: Payer: Self-pay | Admitting: Cardiovascular Disease

## 2015-08-17 DIAGNOSIS — R748 Abnormal levels of other serum enzymes: Secondary | ICD-10-CM

## 2015-08-17 NOTE — Telephone Encounter (Signed)
New message    Wife calling  Still having problems with his blood work showing inflammation of liver and kidney damage. Went to emergency room - [facility].

## 2015-08-17 NOTE — Telephone Encounter (Signed)
Pt's wife called to see if Dr. Clifton James can referred pt to a specialist in liver disease. Pt was sent to the ER on 9/27 by Dr. Sunny Schlein  His PCP due to increased Creatinine, Bilirubin , and Dark amber urine, and that pt needed further workup.  According to the  ER  Doctor"s note states that pt most likely needs to be F/U by  GI . Pt's wife states that the ED doctor said that pt had a kidney injury; pt and wife do not think that is a true statement. Pt states that he feels better. Pt does still feels fatigue, but is because he has CHF, but does not have a fever nor chills. Pt's blood work done in the hospital on 9/27 Creatinine and BUN were normal Creatinine 1.24 BUN 14. The Liver profile continue to be elevated. Pt's wife would like to know if you know a Doctor you can referred pt for elevated liver enzyme.

## 2015-08-17 NOTE — Telephone Encounter (Signed)
Can we refer him to [facility], Dr. Rhea Belton. Thanks, chris

## 2015-08-18 ENCOUNTER — Other Ambulatory Visit: Payer: Self-pay | Admitting: Family Medicine

## 2015-08-18 ENCOUNTER — Telehealth: Payer: Self-pay | Admitting: Family Medicine

## 2015-08-18 DIAGNOSIS — R7989 Other specified abnormal findings of blood chemistry: Secondary | ICD-10-CM

## 2015-08-18 DIAGNOSIS — R945 Abnormal results of liver function studies: Principal | ICD-10-CM

## 2015-08-18 NOTE — Telephone Encounter (Signed)
Patient has appointment scheduled with  GI for 10/4

## 2015-08-18 NOTE — Telephone Encounter (Signed)
Pt mother called and canceled the appt for tomorrow morning for the pt. She states that from all of his "running around" he is just too tired to come to the doctor tomorrow morning, "He has no energy and he lives all the way out in [place]". She would like for Dr. Wende Mott to know that she and the pt feel like they need to get to the "bottom of the issue" and would like for [place] to send him to a specialist that knows more about the liver function. Please advise at the earliest convenience. Sadie Reynolds, ASA

## 2015-08-18 NOTE — Telephone Encounter (Signed)
Referral to Gastroenterology was attempted to be placed -- however it seems as though his cardiologist was asked to place one as well and has already done so.

## 2015-08-18 NOTE — Telephone Encounter (Signed)
Referral made. I spoke with GI office and first opening with Dr. Rhea Belton is in November.  Amy Esterwood, PA has opening next week on August 23, 2015 and appt has been scheduled for this time. I spoke with pt's wife and made her aware of appt. She states pt will be there for appt.

## 2015-08-19 ENCOUNTER — Ambulatory Visit: Payer: Medicare HMO | Admitting: Family Medicine

## 2015-08-23 ENCOUNTER — Other Ambulatory Visit: Payer: Self-pay | Admitting: Family Medicine

## 2015-08-23 ENCOUNTER — Encounter: Payer: Self-pay | Admitting: Physician Assistant

## 2015-08-23 ENCOUNTER — Ambulatory Visit (HOSPITAL_COMMUNITY)
Admission: RE | Admit: 2015-08-23 | Discharge: 2015-08-23 | Disposition: A | Discharge status: Home / Self Care | Payer: Medicare HMO | Source: Ambulatory Visit | Attending: Physician Assistant | Admitting: Physician Assistant

## 2015-08-23 ENCOUNTER — Other Ambulatory Visit (INDEPENDENT_AMBULATORY_CARE_PROVIDER_SITE_OTHER): Payer: Medicare HMO

## 2015-08-23 ENCOUNTER — Ambulatory Visit (INDEPENDENT_AMBULATORY_CARE_PROVIDER_SITE_OTHER): Payer: Medicare HMO | Admitting: Physician Assistant

## 2015-08-23 VITALS — BP 110/78 | HR 60 | Ht 66.0 in | Wt 173.8 lb

## 2015-08-23 DIAGNOSIS — R938 Abnormal findings on diagnostic imaging of other specified body structures: Secondary | ICD-10-CM

## 2015-08-23 DIAGNOSIS — R7989 Other specified abnormal findings of blood chemistry: Secondary | ICD-10-CM | POA: Diagnosis not present

## 2015-08-23 DIAGNOSIS — Z87891 Personal history of nicotine dependence: Secondary | ICD-10-CM | POA: Diagnosis not present

## 2015-08-23 DIAGNOSIS — R918 Other nonspecific abnormal finding of lung field: Secondary | ICD-10-CM | POA: Diagnosis present

## 2015-08-23 DIAGNOSIS — I25119 Atherosclerotic heart disease of native coronary artery with unspecified angina pectoris: Secondary | ICD-10-CM

## 2015-08-23 DIAGNOSIS — R9389 Abnormal findings on diagnostic imaging of other specified body structures: Secondary | ICD-10-CM

## 2015-08-23 DIAGNOSIS — R945 Abnormal results of liver function studies: Principal | ICD-10-CM

## 2015-08-23 DIAGNOSIS — I10 Essential (primary) hypertension: Secondary | ICD-10-CM | POA: Diagnosis not present

## 2015-08-23 LAB — CBC WITH DIFFERENTIAL/PLATELET
BASOS PCT: 0.8 % (ref 0.0–3.0)
Basophils Absolute: 0.1 10*3/uL (ref 0.0–0.1)
EOS ABS: 0.3 10*3/uL (ref 0.0–0.7)
Eosinophils Relative: 4 % (ref 0.0–5.0)
HCT: 43.1 % (ref 39.0–52.0)
HEMOGLOBIN: 14.3 g/dL (ref 13.0–17.0)
LYMPHS ABS: 1.7 10*3/uL (ref 0.7–4.0)
Lymphocytes Relative: 22.2 % (ref 12.0–46.0)
MCHC: 33.2 g/dL (ref 30.0–36.0)
MCV: 87.6 fl (ref 78.0–100.0)
MONO ABS: 0.9 10*3/uL (ref 0.1–1.0)
Monocytes Relative: 11.3 % (ref 3.0–12.0)
NEUTROS PCT: 61.7 % (ref 43.0–77.0)
Neutro Abs: 4.7 10*3/uL (ref 1.4–7.7)
Platelets: 324 10*3/uL (ref 150.0–400.0)
RBC: 4.93 Mil/uL (ref 4.22–5.81)
RDW: 15.3 % (ref 11.5–15.5)
WBC: 7.7 10*3/uL (ref 4.0–10.5)

## 2015-08-23 LAB — SEDIMENTATION RATE: SED RATE: 38 mm/h — AB (ref 0–22)

## 2015-08-23 LAB — COMPREHENSIVE METABOLIC PANEL
ALBUMIN: 4.1 g/dL (ref 3.5–5.2)
ALT: 59 U/L — ABNORMAL HIGH (ref 0–53)
AST: 36 U/L (ref 0–37)
Alkaline Phosphatase: 430 U/L — ABNORMAL HIGH (ref 39–117)
BUN: 12 mg/dL (ref 6–23)
CHLORIDE: 103 meq/L (ref 96–112)
CO2: 28 mEq/L (ref 19–32)
CREATININE: 1.18 mg/dL (ref 0.40–1.50)
Calcium: 10 mg/dL (ref 8.4–10.5)
GFR: 66.83 mL/min (ref >60.00)
GLUCOSE: 105 mg/dL — AB (ref 70–99)
Potassium: 4.6 mEq/L (ref 3.5–5.1)
Sodium: 141 mEq/L (ref 135–145)
Total Bilirubin: 0.9 mg/dL (ref 0.2–1.2)
Total Protein: 7.5 g/dL (ref 6.0–8.3)

## 2015-08-23 LAB — PROTIME-INR
INR: 1 ratio (ref 0.8–1.0)
Prothrombin Time: 11.1 s (ref 9.6–13.1)

## 2015-08-23 IMAGING — DX DG CHEST 2V
2 series · 2 of 2 positions shown · non-contrast
Comparison: [DATE]

CLINICAL DATA: Hypertension.  Former smoker.  Recent abnormal CT.

EXAM:
CHEST  2 VIEW

[chest pa]
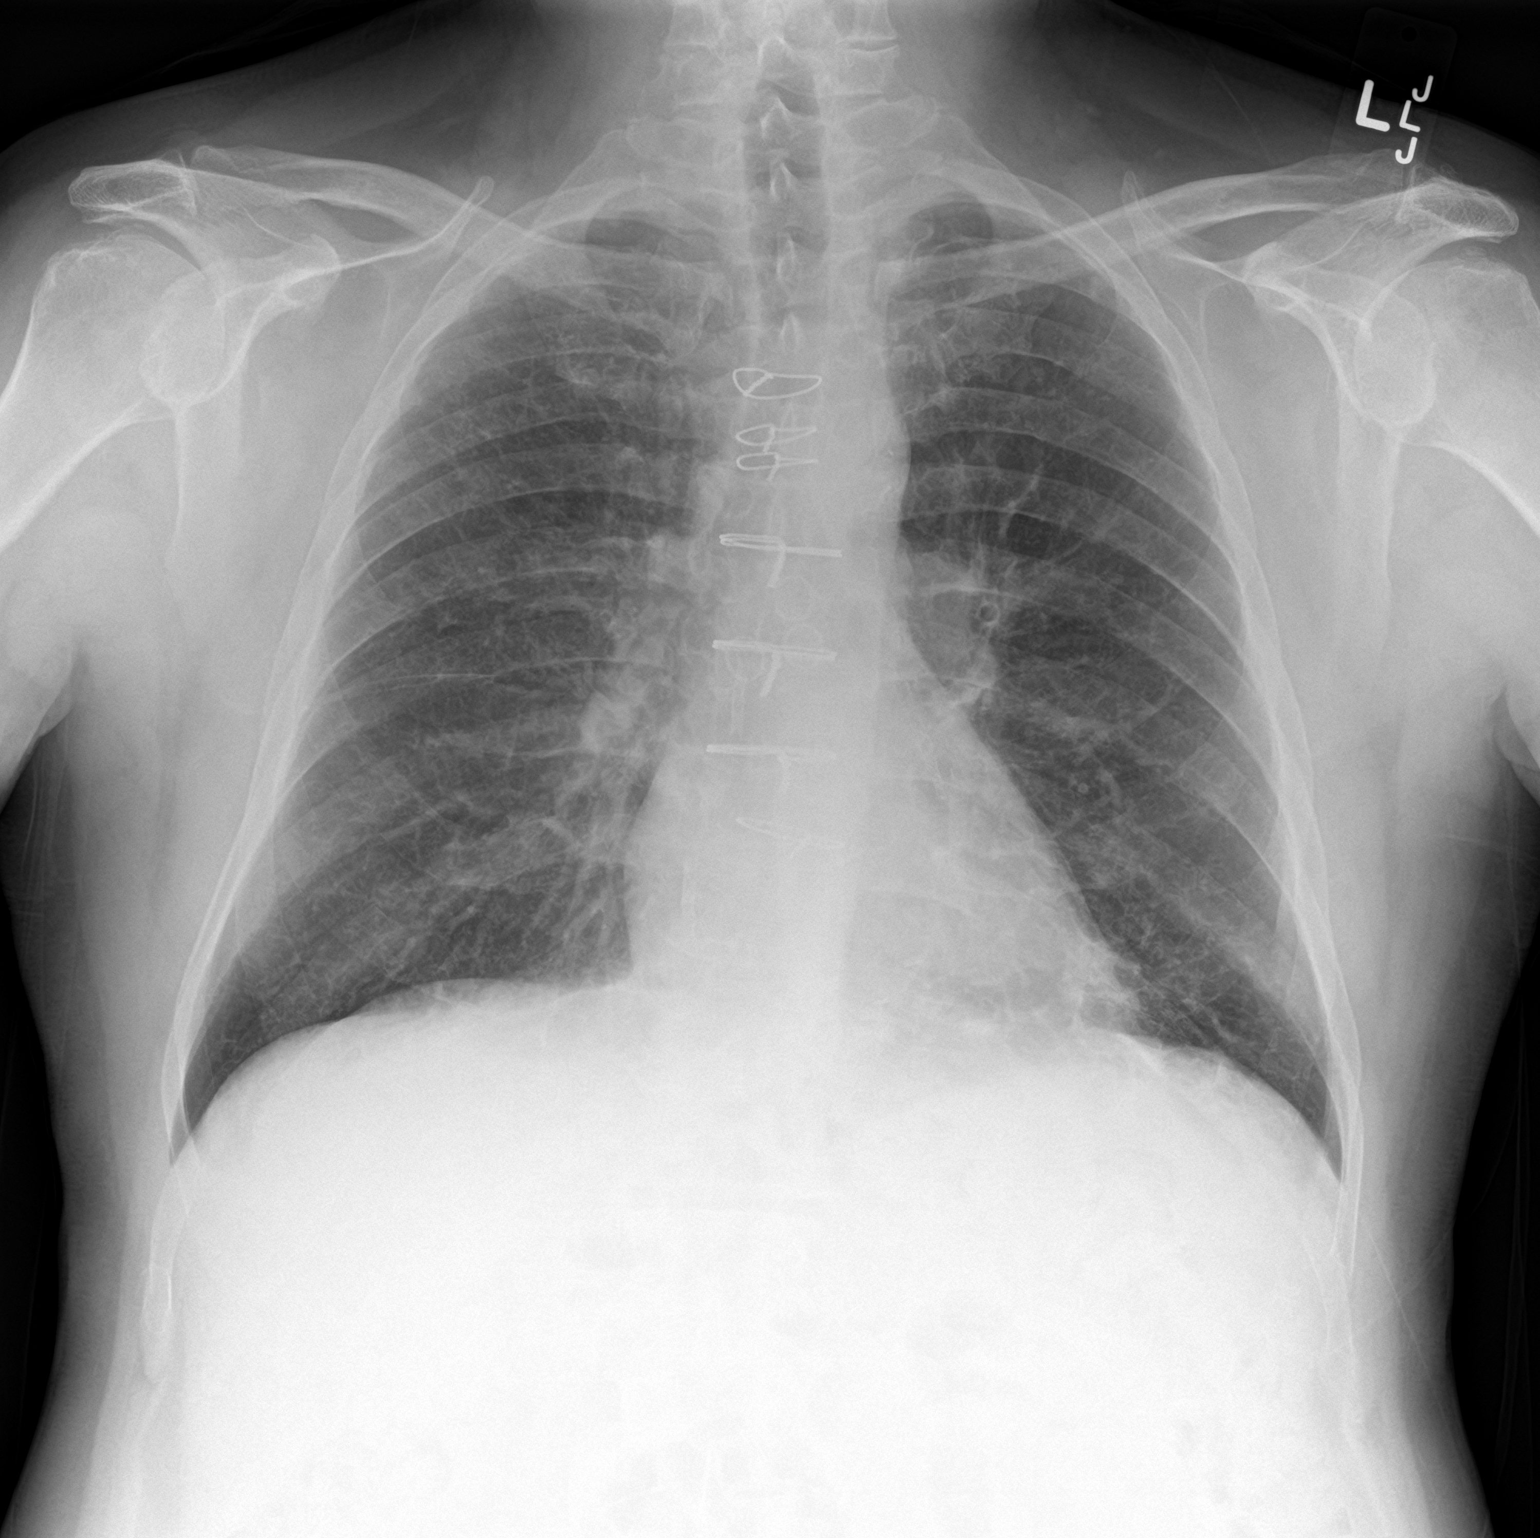

[chest lat]
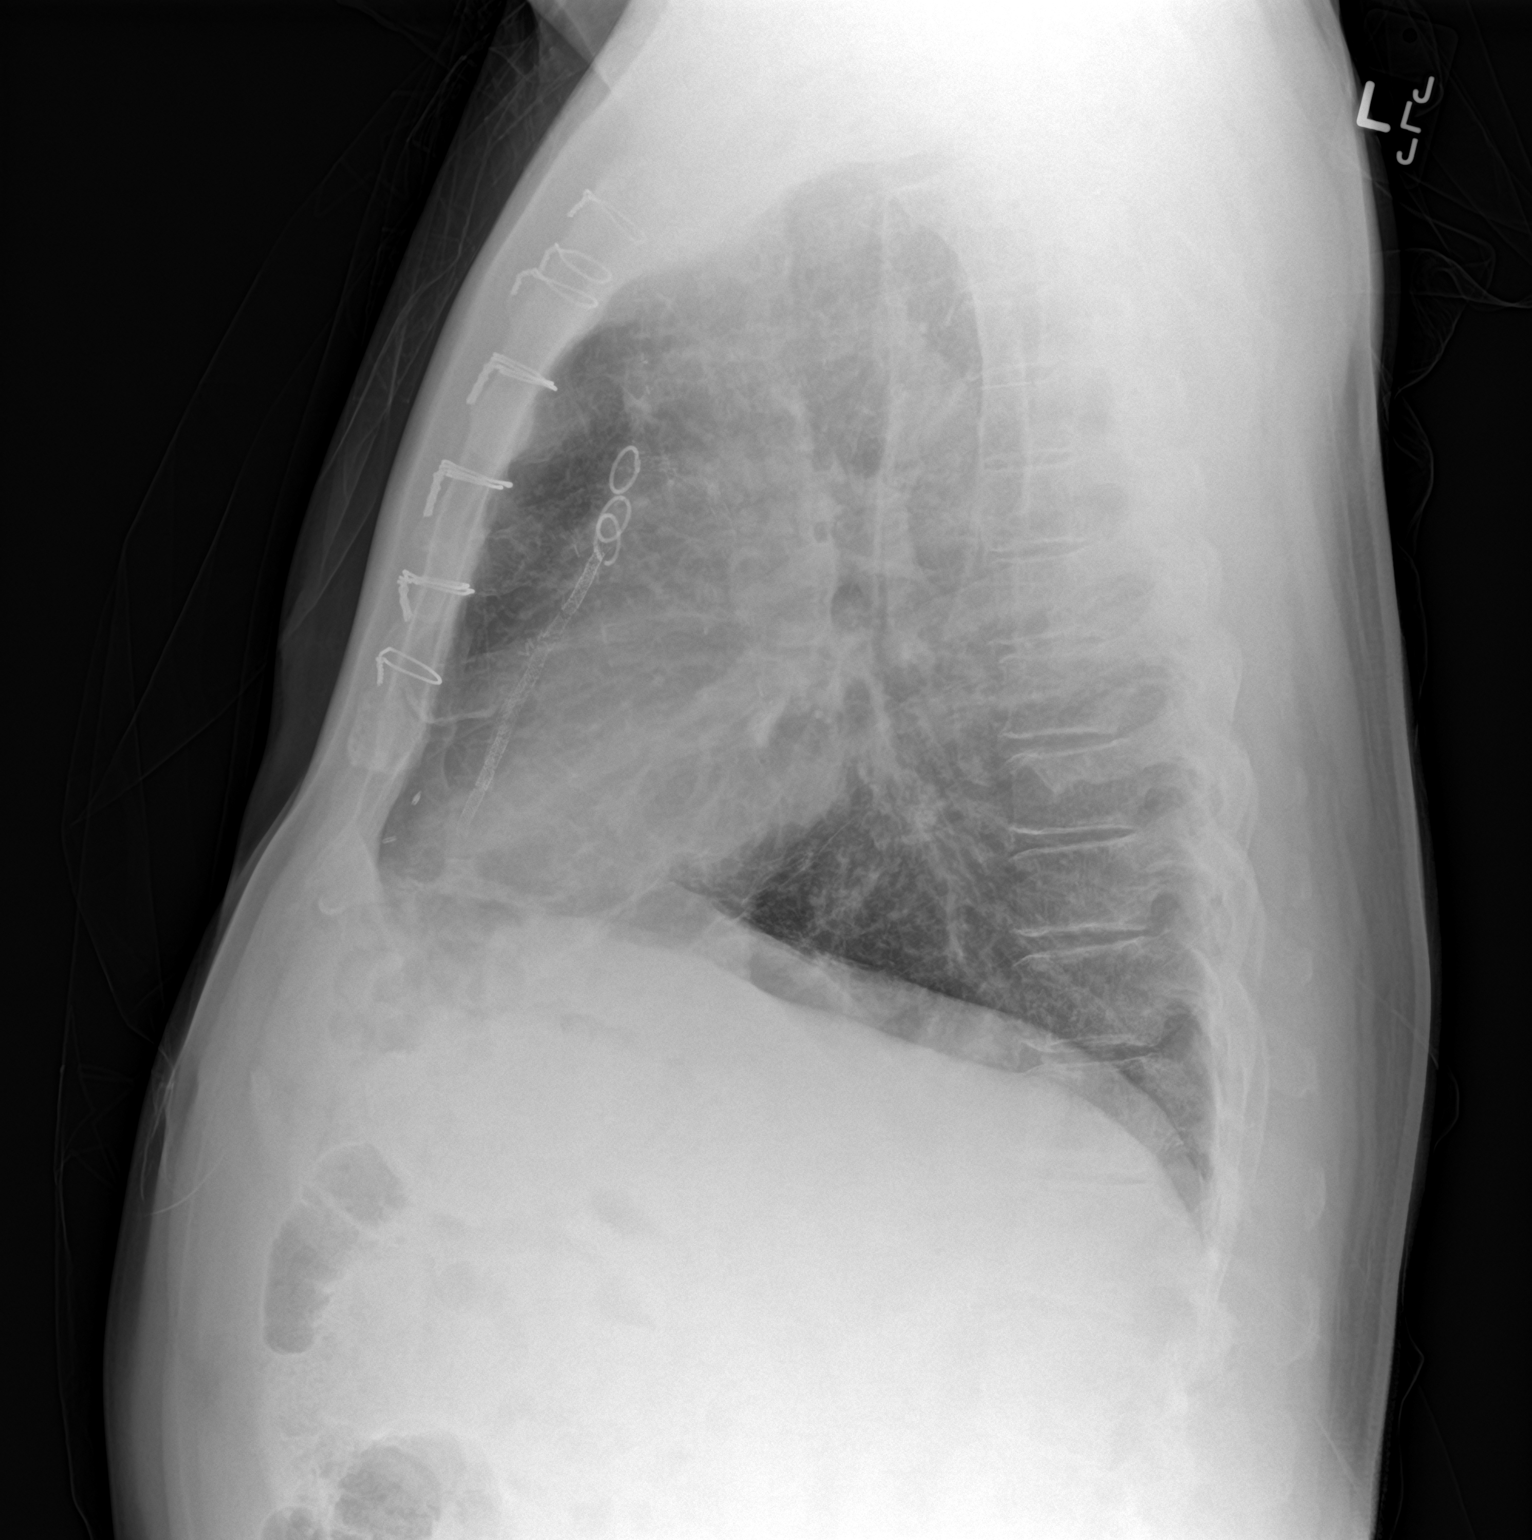

[2 of 2 positions shown; findings below may reference images not displayed]

FINDINGS: The heart size appears normal. There is no pleural effusion or edema
identified. The lungs are hyperinflated and there are coarsened
interstitial markings noted bilaterally. New nodular opacity is
identified in the left lung base measuring 2.3 cm. Right lung is
clear.
IMPRESSION: 1. Left base opacity is new from previous exam and has a nodular
configuration. In an asymptomatic patient that has risk factors for
pulmonary malignancy further imaging with chest CT is recommended.

## 2015-08-23 NOTE — Patient Instructions (Signed)
Please go to the basement level to have your labs drawn.   You can go directly to [facility] Radiology department, first floor.

## 2015-08-24 ENCOUNTER — Encounter: Payer: Self-pay | Admitting: Physician Assistant

## 2015-08-24 LAB — EBV AB TO VIRAL CAPSID AG PNL, IGG+IGM
EBV VCA IGG: 422 U/mL — AB (ref <18.0)
EBV VCA IGM: 12.6 U/mL (ref <36.0)

## 2015-08-24 NOTE — Progress Notes (Addendum)
Patient ID: Frank [name], male   DOB: 1956/08/22, 59 y.o.   MRN: [id number]   Subjective:    Patient ID: Frank [name], male    DOB: [birth year], 59 y.o.   MRN: [id number]  HPI Frank [name] is a 59 year old white male referred today for evaluation of abnormal liver function studies test. He is being referred by Dr. Casimiro Needle Haney/cardiology. He is to be established with Dr.Pyrtle. Patient has history of significant coronary artery disease with CABG in 2004, MI in 2013 with stents placed at that time. He is maintained on Plavix and aspirin. Also with hypertension, depression, and congestive heart failure. He has had some anginal symptoms and was started on Ranexa 500 mg twice daily within the past 2 months. Patient had a brief hospitalization at [facility] earlier in September with flulike symptoms and elevated LFTs. He and his wife states that his symptoms started with fever or chills sweats nausea and an achy feeling. This was associated with mild diarrhea, no abdominal pain. He had CT on 08/08/2015 without IV contrast that showed a patchy infiltrate bilaterally in his lung bases otherwise negative exam. It was advised to have follow-up chest x-ray in a few weeks to assure resolution. Upper abdominal ultrasound was also done on 08/08/2015 and was negative. Tabs on 918 showed total bilirubin of 2.2 alkaline phosphatase of 448 AST of 89 and ALT of 160. Acute hepatitis panel was done and was negative. He was discharged and was to have GI follow-up. He then had an ER visit on 926 with dark urine at that time. Labs had been done showing total bilirubin of 2.3 alkaline phosphatase of 719 AST of 98  ALT of 159.  At this time patient is feeling fine. He has chronic problems with fatigue which she attributes to his heart disease but has not had any other symptoms over the past week and a half to 2 weeks. Again his appetite is been fine he has no complaints of abdominal discomfort. No further fever, chills ,nausea etc. No  known infectious exposures, only new med has been the Ranexa Family history positive for liver disease in a brother secondary to EtOH. His only current symptom is intermittent complaints of darker yellow urine.  Review of Systems Pertinent positive and negative review of systems were noted in the above HPI section.  All other review of systems was otherwise negative.  Outpatient Encounter Prescriptions as of 08/23/2015  Medication Sig  . aspirin EC 81 MG tablet Take 81 mg by mouth daily.  . citalopram (CELEXA) 40 MG tablet Take 1 tablet (40 mg total) by mouth daily.  . clopidogrel (PLAVIX) 75 MG tablet TAKE 1 TABLET BY MOUTH ONCE A DAY.  . furosemide (LASIX) 20 MG tablet   . metoprolol succinate (TOPROL-XL) 25 MG 24 hr tablet Take 1 tablet (25 mg total) by mouth daily. Take with or immediately following a meal.  . mometasone (NASONEX) 50 MCG/ACT nasal spray Place 2 sprays into the nose daily. (Patient taking differently: Place 2 sprays into the nose daily as needed (allergies). )  . Multiple Vitamin (MULTIVITAMIN WITH MINERALS) TABS tablet Take 1 tablet by mouth daily.  Marland Kitchen NITROSTAT 0.4 MG SL tablet DISSOLVE 1 TABLET UNDER TONGUE EVERY 5 MINUTES UP TO 15 MIN FOR CHESTPAIN. IF NO RELIEF CALL 911.  . pantoprazole (PROTONIX) 40 MG tablet Take 1 tablet (40 mg total) by mouth daily.  . potassium chloride (K-DUR,KLOR-CON) 20 MEQ tablet Take 1 tablet (20 mEq total) by mouth daily.  Marland Kitchen  ranolazine (RANEXA) 500 MG 12 hr tablet Take 1 tablet (500 mg total) by mouth 2 (two) times daily.  Marland Kitchen tetrahydrozoline-zinc (VISINE-AC) 0.05-0.25 % ophthalmic solution Place 2 drops into both eyes 3 (three) times daily as needed (itching/burning).  . [DISCONTINUED] furosemide (LASIX) 40 MG tablet Take 0.5 tablets (20 mg total) by mouth daily.   No facility-administered encounter medications on file as of 08/23/2015.   Allergies  Allergen Reactions  . Sulfa Antibiotics Rash  . Hydrocodone Hives and Itching   Patient  Active Problem List   Diagnosis Date Noted  . Elevated LFTs 08/07/2015  . AKI (acute kidney injury) (HCC) 08/07/2015  . Coronary artery disease involving native coronary artery of native heart with unstable angina pectoris (HCC)   . Pain of right great toe 10/27/2014  . Tinea unguium 07/08/2014  . Eustachian tube disorder 07/08/2014  . Cough 07/08/2014  . Hives 12/31/2013  . Rib pain on right side 12/11/2013  . Pulmonary nodules/lesions, multiple 12/11/2013  . Dyslipidemia 10/27/2013  . Chronic anticoagulation 09/28/2013  . Actinic keratosis 10/31/2012  . Snoring disorder 10/31/2012  . Umbilical hernia 03/28/2012  . Dyspnea 03/20/2012  . Depression 01/02/2012  . Muscle spasm of both lower legs 01/02/2012  . Mitral regurgitation 06/14/2010  . CAD 04/07/2010  . Low HDL (under 40) 11/07/2009  . Essential hypertension, benign 11/07/2009  . Congestive heart failure 11/07/2009  . GERD 11/07/2009   Social History   Social History  . Marital Status: Married    Spouse Name: N/A  . Number of Children: 3  . Years of Education: N/A   Occupational History  . 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 Smoking status: Former Smoker -- 3.00 packs/day for 40 years    Types: Cigarettes    Quit date: 01/23/2003  . Smokeless tobacco: Never Used  . 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 Drug Use: No     Comment: 05/20/12 "last marijuana was 2004"  . Sexual Activity: Yes   Other Topics Concern  . Not on file   Social History Narrative    [name]'s family history includes Cancer in his mother; Diabetes in his father; Heart disease in his mother; Heart disease (age of onset: 54) in his brother; Hyperlipidemia in his father; Hypertension in his father; Stomach cancer in his father.      Objective:    Filed Vitals:   08/23/15 0829  BP: 110/78  Pulse: 60    Physical Exam well-developed white male in no  acute distress, accompanied by his wife blood pressure 110/78 pulse 60 height 5 foot 6 weight 173. HEENT ;nontraumatic normocephalic EOMI PERRLA sclera anicteric, Supple ;no JVD, Cardiovascular; regular rate and rhythm with S1-S2 are: Incisional scar, Pulmonary; clear bilaterally, Abdomen ;soft nontender nondistended no palpable mass or hepatosplenomegaly bowel sounds are present on Rectal exam not done, Ext; no clubbing cyanosis or edema skin warm and dry, Neuropsych; mood and affect appropriate       Assessment & Plan:   #1 59 yo WM with recent ilness with fever,chills ,myalgias, nausea and elevated LFT's- negative imaging with Ct and [place] except for nodular patchy bilateral infiltrates bilateral lung bases SXS resolved - suspect infectious etiology-possibly viral #2 CAD s/pMI , CABG , stents #3 CHF  #4 chronic antiplatelet  therapy- Plavix and ASA  Plan; Check CXR pa and Lat Repeat hepatic panel , EBV and  CMV serologies  Continue observation as long as Liver tests continue to normalize  further plans pending results of above  pt will be established with Dr. Emelia Loron PA-C 08/24/2015   Cc: Kathee Delton, MD  Addendum: Reviewed and agree with initial management. Infectious etiology most likely given improvement and flu-like symptoms that were accompanying and now resolved.  DILI less likely LFTs are improving, rec following to normalization If fail to normalize would rec AMA, ANA, ASMA, IgG to look for AI causes Beverley Fiedler, MD

## 2015-08-25 ENCOUNTER — Other Ambulatory Visit: Payer: Self-pay

## 2015-08-25 ENCOUNTER — Telehealth: Payer: Self-pay | Admitting: Physician Assistant

## 2015-08-25 DIAGNOSIS — R7989 Other specified abnormal findings of blood chemistry: Secondary | ICD-10-CM

## 2015-08-25 DIAGNOSIS — R945 Abnormal results of liver function studies: Secondary | ICD-10-CM

## 2015-08-25 DIAGNOSIS — R9389 Abnormal findings on diagnostic imaging of other specified body structures: Secondary | ICD-10-CM

## 2015-08-25 NOTE — Telephone Encounter (Signed)
CT is scheduled for 08/31/15. Patient's mother in law is aware and will instruct the patient to fast for 4 hours prior to the procedure.

## 2015-08-25 NOTE — Telephone Encounter (Signed)
-----   Message from Sammuel Cooper, PA-C sent at 08/23/2015  8:06 PM EDT ----- Please let pt know his labs look better -liver tests improving.. Other viral hepatitis blood tests take several days to come back.Marland Kitchen His CXR shows a nodular density in left lung base  And radiologist suggests CT of Chest.. Please order Chest CT -"r/o left lung mass- abnormal CXR"..thanks

## 2015-08-29 ENCOUNTER — Other Ambulatory Visit: Payer: Self-pay

## 2015-08-29 DIAGNOSIS — R945 Abnormal results of liver function studies: Secondary | ICD-10-CM

## 2015-08-31 ENCOUNTER — Ambulatory Visit (HOSPITAL_COMMUNITY): Payer: Medicare HMO

## 2015-09-01 ENCOUNTER — Ambulatory Visit (HOSPITAL_COMMUNITY): Payer: Medicare HMO

## 2015-09-07 ENCOUNTER — Ambulatory Visit (HOSPITAL_COMMUNITY)
Admission: RE | Admit: 2015-09-07 | Discharge: 2015-09-07 | Disposition: A | Discharge status: Home / Self Care | Payer: Medicare HMO | Source: Ambulatory Visit | Attending: Physician Assistant | Admitting: Physician Assistant

## 2015-09-07 ENCOUNTER — Encounter (HOSPITAL_COMMUNITY): Payer: Self-pay

## 2015-09-07 DIAGNOSIS — J439 Emphysema, unspecified: Secondary | ICD-10-CM | POA: Diagnosis not present

## 2015-09-07 DIAGNOSIS — R938 Abnormal findings on diagnostic imaging of other specified body structures: Secondary | ICD-10-CM | POA: Diagnosis present

## 2015-09-07 DIAGNOSIS — R590 Localized enlarged lymph nodes: Secondary | ICD-10-CM | POA: Diagnosis not present

## 2015-09-07 DIAGNOSIS — R918 Other nonspecific abnormal finding of lung field: Secondary | ICD-10-CM | POA: Insufficient documentation

## 2015-09-07 DIAGNOSIS — R9389 Abnormal findings on diagnostic imaging of other specified body structures: Secondary | ICD-10-CM

## 2015-09-07 IMAGING — CT CT CHEST W/ CM
2 of 4 series · 15 of 36 positions shown, 18 images · IV contrast (omnipaque)
Comparison: Chest x-ray from [DATE].  CT chest from [DATE].

CLINICAL DATA: Subsequent encounter for left lower lobe nodular
opacity on recent chest x-ray.

EXAM:
CT CHEST WITH CONTRAST
TECHNIQUE: Multidetector CT imaging of the chest was performed during
intravenous contrast administration.
CONTRAST:  75mL OMNIPAQUE IOHEXOL 300 MG/ML  SOLN

[Series 2: chest with st · axial · 0.74mm/px · z∈[+836,+1116]mm · 12 of 66 slices shown, 15 images]
[im 5/66  mediastinal]
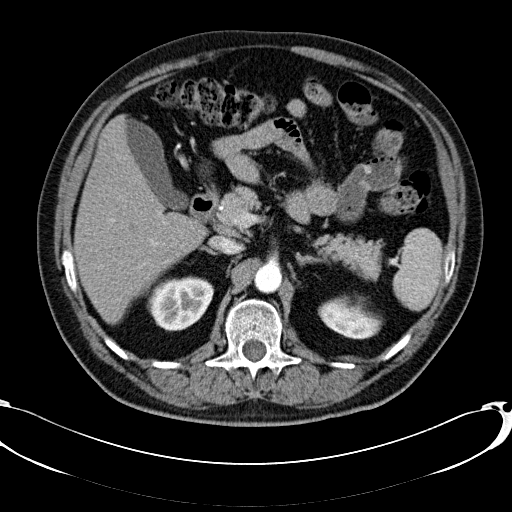
[im 5/66  lung]
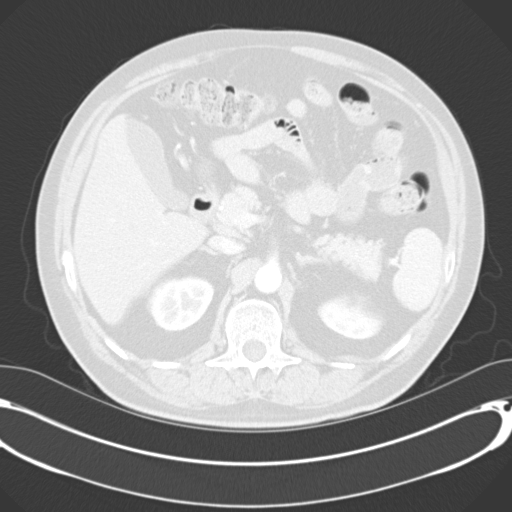
[im 10/66  lung]
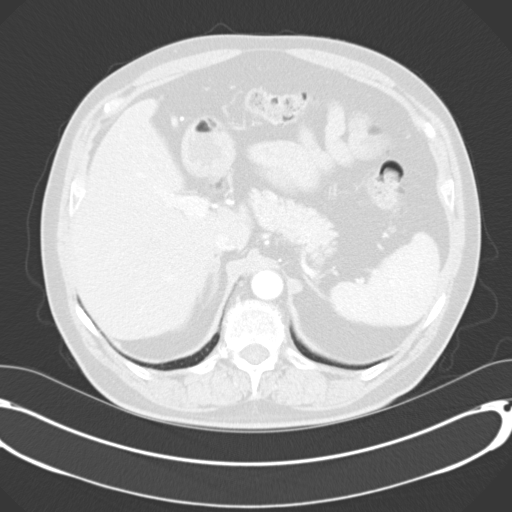
[im 14/66  lung]
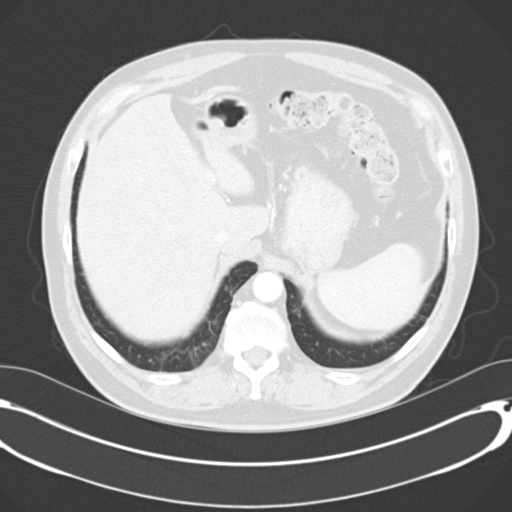
[im 19/66  lung]
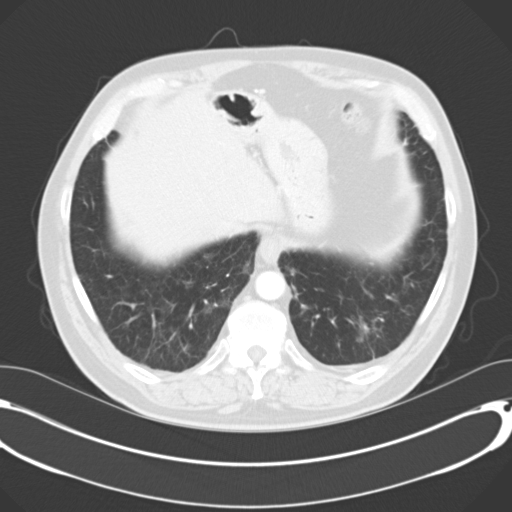
[im 24/66  mediastinal]
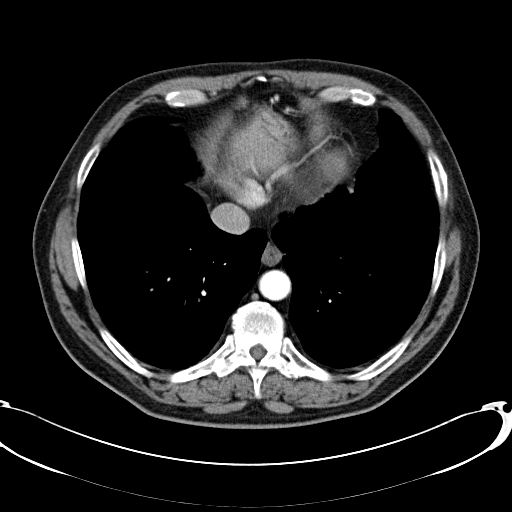
[im 24/66  lung]
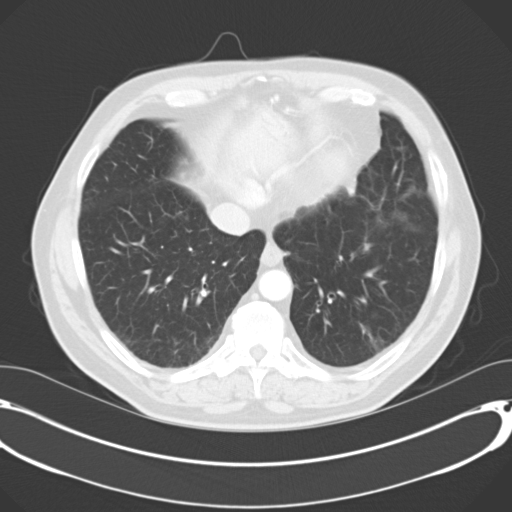
[im 28/66  lung]
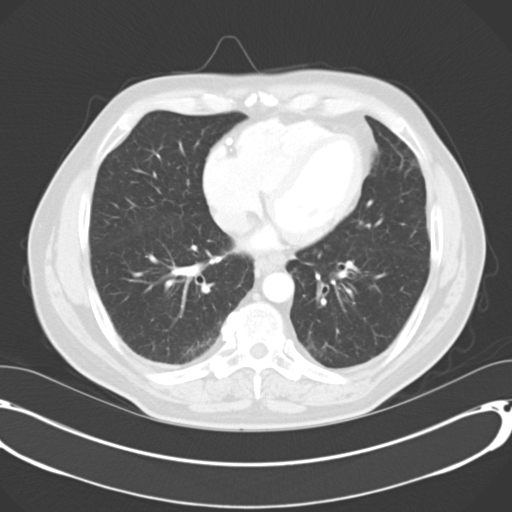
[im 38/66  lung]
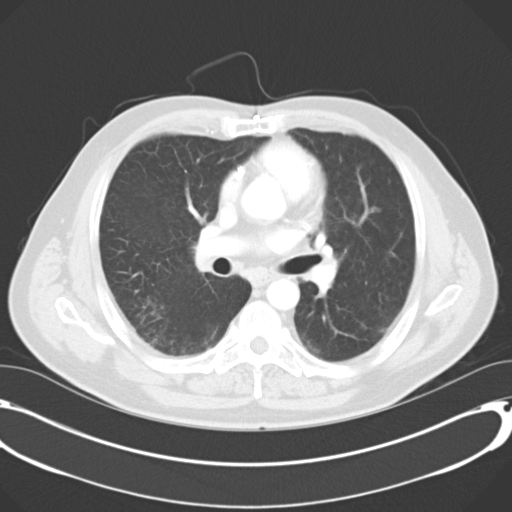
[im 42/66  lung]
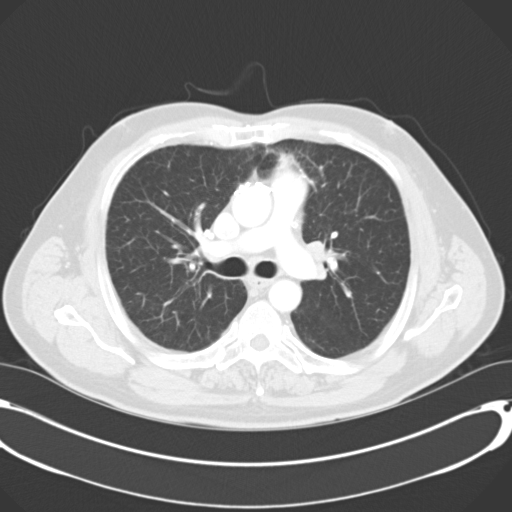
[im 47/66  mediastinal]
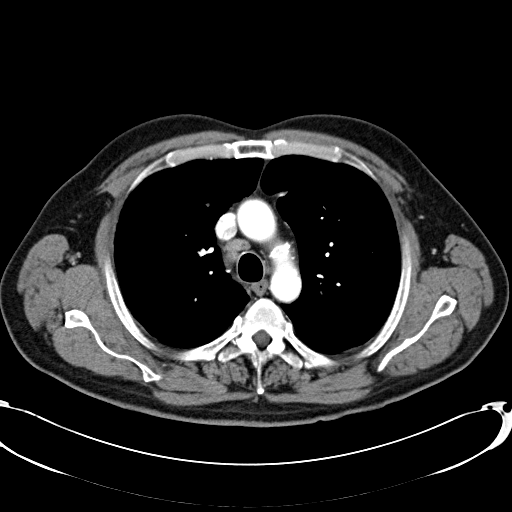
[im 47/66  lung]
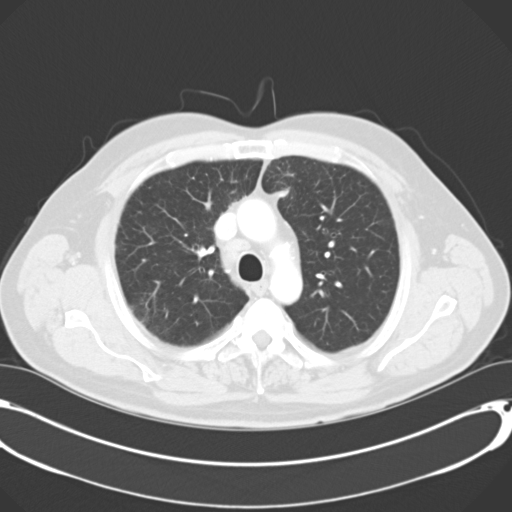
[im 52/66  lung]
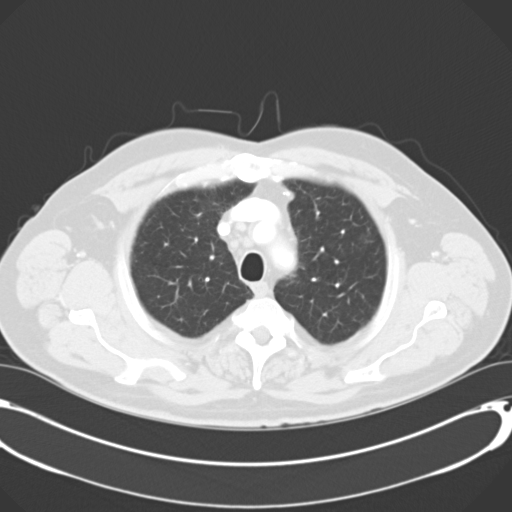
[im 56/66  lung]
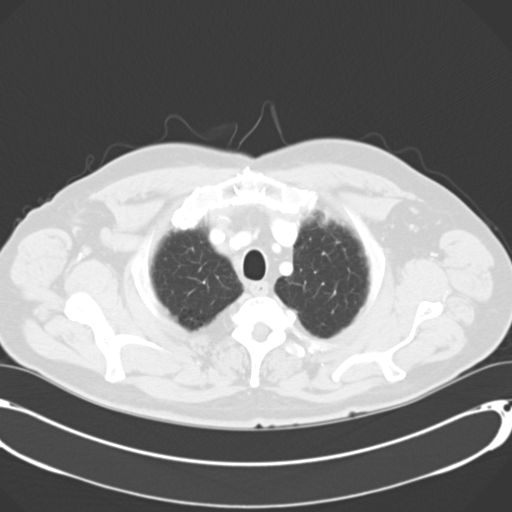
[im 61/66  lung]
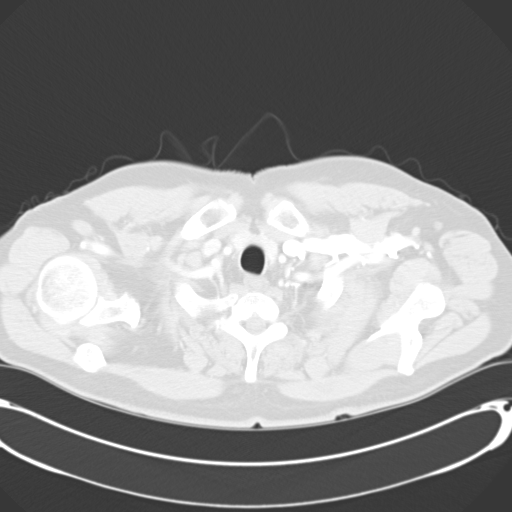

[Series 602: <mpr thick range> · coronal · 0.74mm/px · 3 of 95 slices shown]
[im 19/95  lung]
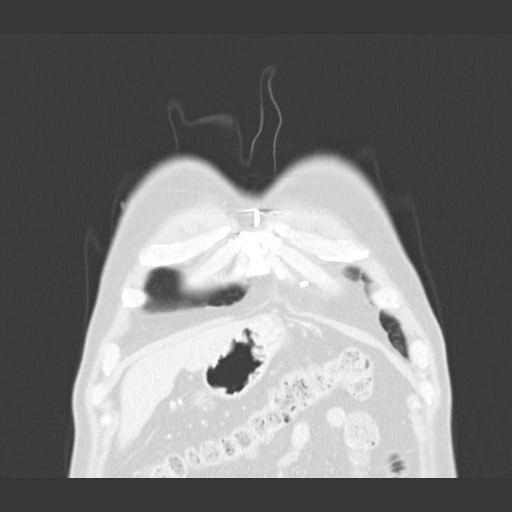
[im 38/95  lung]
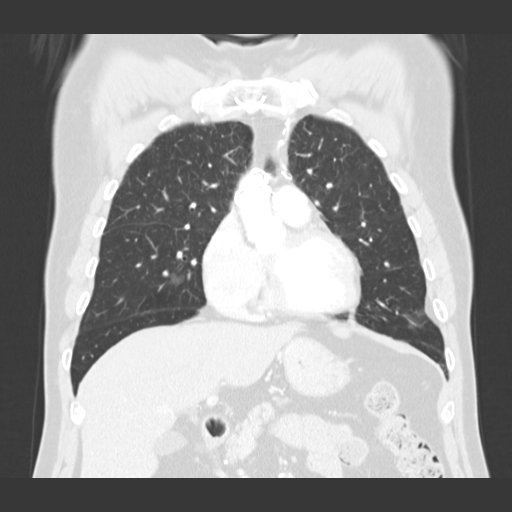
[im 57/95  lung]
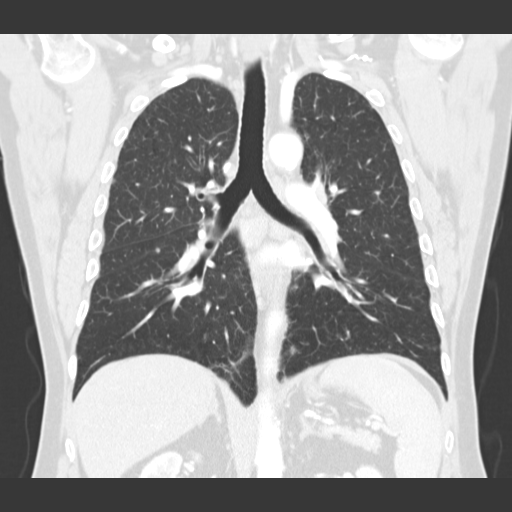

[15 of 36 positions shown; findings below may reference images not displayed]

FINDINGS: Mediastinum / Lymph Nodes: There is no axillary lymphadenopathy. 12
mm short axis right paratracheal lymph node is mildly enlarged and
measured 10 mm when I remeasure on the previous study of the same
level. 10 mm short axis borderline enlarged subcarinal lymph node is
evident, 7 mm short axis previously. 13 mm short axis left hilar
lymph node is unchanged. The esophagus has normal CT imaging
features. The heart size is normal. No pericardial effusion. Patient
is status post CABG

Lungs / Pleura: As before, scattered areas of interstitial chronic
change are evident, with a peripheral predominance. There is some
scarring in the medial left lung base, at the cardiac apex. This
accounts for the abnormality seen on the recent chest x-ray. 1.2 x
1.9 cm focus of ground-glass attenuation is identified in the
posterior left lower lobe, new in the interval. Changes of emphysema
most prominent in the upper lobes. There is some chronic bronchial
wall thickening.

[HOSPITAL] / Soft Tissues: Bone windows reveal no worrisome lytic or
sclerotic osseous lesions.

Upper Abdomen:  Unremarkable.
IMPRESSION: 1. 1.9 x 2.9 cm ground-glass nodule identified in the left lower
lobe. Initial follow-up by chest CT without contrast is recommended
in 3 months to confirm persistence. This recommendation follows the
consensus statement: Recommendations for the Management of Subsolid
Pulmonary Nodules Detected at CT: A Statement from the [facility]
2. Borderline lymphadenopathy in the mediastinum is not
substantially changed since the study from 1 year ago.
3. Emphysema.

## 2015-09-07 MED ORDER — IOHEXOL 300 MG/ML  SOLN
75.0000 mL | Freq: Once | INTRAMUSCULAR | Status: AC | PRN
  Administered 2015-09-07: 75 mL via INTRAVENOUS

## 2015-09-08 ENCOUNTER — Ambulatory Visit: Payer: Medicare HMO | Admitting: Family Medicine

## 2015-09-13 ENCOUNTER — Telehealth: Payer: Self-pay | Admitting: Physician Assistant

## 2015-09-14 NOTE — Telephone Encounter (Signed)
Please let pt know the Chest Ct shows a 1.9 x 2.9 cm nodule in left lower lung- radiologist recommended follow up CT in 3 months to assure stable .  Lets get him an appt with Pulmonary so they can review , and do the follow up Ct - please schedule him an appt with Pulmonary

## 2015-09-14 NOTE — Telephone Encounter (Signed)
His chest CT results are in. Please advise in result note.

## 2015-09-14 NOTE — Telephone Encounter (Signed)
Frank HymenBonnie informed of an appointment on 10/03/15 with Dr Sherene SiresWert. Arrive at 9:30 am for follow up of an abnormal chest CT.

## 2015-09-21 ENCOUNTER — Other Ambulatory Visit: Payer: Self-pay | Admitting: Family Medicine

## 2015-10-03 ENCOUNTER — Encounter: Payer: Self-pay | Admitting: Internal Medicine

## 2015-10-03 ENCOUNTER — Ambulatory Visit (INDEPENDENT_AMBULATORY_CARE_PROVIDER_SITE_OTHER): Payer: Medicare HMO | Admitting: Internal Medicine

## 2015-10-03 VITALS — BP 130/74 | HR 65 | Ht 66.0 in | Wt 178.0 lb

## 2015-10-03 DIAGNOSIS — R0600 Dyspnea, unspecified: Secondary | ICD-10-CM | POA: Diagnosis not present

## 2015-10-03 DIAGNOSIS — R911 Solitary pulmonary nodule: Secondary | ICD-10-CM

## 2015-10-03 DIAGNOSIS — R05 Cough: Secondary | ICD-10-CM | POA: Diagnosis not present

## 2015-10-03 DIAGNOSIS — R058 Other specified cough: Secondary | ICD-10-CM

## 2015-10-03 MED ORDER — FAMOTIDINE 20 MG PO TABS: ORAL_TABLET | ORAL | Status: DC

## 2015-10-03 NOTE — Patient Instructions (Signed)
Pantoprazole (protonix) 40 mg   Take  30-60 min before first meal of the day and Pepcid (famotidine)  20 mg one @  bedtime until return to office - this is the best way to tell whether stomach acid is contributing to your problem.    For drainage / throat tickle try take CHLORPHENIRAMINE  4 mg - take one every 4 hours as needed - available over the counter- may cause drowsiness so start with just a bedtime dose or two and see how you tolerate it before trying in daytime    GERD (REFLUX)  is an extremely common cause of respiratory symptoms just like yours , many times with no obvious heartburn at all.    It can be treated with medication, but also with lifestyle changes including elevation of the head of your bed (ideally with 6 inch  bed blocks),  Smoking cessation, avoidance of late meals, excessive alcohol, and avoid fatty foods, chocolate, peppermint, colas, red wine, and acidic juices such as orange juice.  NO MINT OR MENTHOL PRODUCTS SO NO COUGH DROPS  USE SUGARLESS CANDY INSTEAD (Jolley ranchers or Stover's or Life Savers) or even ice chips will also do - the key is to swallow to prevent all throat clearing. NO OIL BASED VITAMINS - use powdered substitutes.  Please schedule a follow up visit in 2  months but call sooner if needed with cxr

## 2015-10-03 NOTE — Progress Notes (Signed)
Subjective:    Patient ID: Frank [name], male    DOB: 02/11/1956,    MRN: [id number]  HPI  31 yowm quit smoking 2004 at [facility] and improved initially but then downhill since 2010 attributed to heart dz per Cardiology Frank [name]) referred 10/03/2015  to pulmonary clinic re abn  Ct by Dr Frank [name]    10/03/2015 1st [facility] office visit/ Frank [name]   Chief Complaint  Patient presents with  . Pulmonary Consult    Referred by Dr. Rhea [name] for eval of abnormal CT Chest. Pt c/o cough for the past yr- prod at times but unsure sputum color.   chronic doe = MMRC1 = can walk nl pace, flat grade, can't hurry or go uphills or steps s sob   No better with inhalers in past  Sense of pnds/ need to cough at hs and freq at hs x years / no am exac  Already on ppi but no ac and not following diet  No obvious other patterns in day to day or daytime variabilty or assoc  cp or chest tightness, subjective wheeze overt  hb symptoms. No unusual exp hx or h/o childhood pna/ asthma or knowledge of premature birth.  Sleeping ok without nocturnal  or early am exacerbation  of respiratory  c/o's or need for noct saba. Also denies any obvious fluctuation of symptoms with weather or environmental changes or other aggravating or alleviating factors except as outlined above   Current Medications, Allergies, Complete Past Medical History, Past Surgical History, Family History, and Social History were reviewed in [employer] record.           Review of Systems  Constitutional: Negative for fever, chills, activity change, appetite change and unexpected weight change.  HENT: Negative for congestion, dental problem, postnasal drip, rhinorrhea, sneezing, sore throat, trouble swallowing and voice change.   Eyes: Negative for visual disturbance.  Respiratory: Positive for cough. Negative for choking and shortness of breath.   Cardiovascular: Negative for chest pain and leg swelling.  Gastrointestinal:  Negative for nausea, vomiting and abdominal pain.  Genitourinary: Negative for difficulty urinating.  Musculoskeletal: Negative for arthralgias.  Skin: Negative for rash.  Psychiatric/Behavioral: Negative for behavioral problems and confusion.       Objective:   Physical Exam   .pleasant amb wm of about stated age chewing mint gum  Wt Readings from Last 3 Encounters:  10/03/15 178 lb (80.74 kg)  08/23/15 173 lb 12.8 oz (78.835 kg)  08/12/15 172 lb (78.019 kg)    Vital signs reviewed    HEENT: edentulous/ nl turbinates, and oropharynx. Nl external ear canals without cough reflex   NECK :  without JVD/Nodes/TM/ nl carotid upstrokes bilaterally   LUNGS: no acc muscle use, clear to A and P bilaterally without cough on insp or exp maneuvers   CV:  RRR  no s3 or murmur or increase in P2, no edema   ABD:  Mod obeese/ soft and nontender with  Very end of insp has Pos Hoover's in the supine position. No bruits or organomegaly, bowel sounds nl  MS:  warm without deformities, calf tenderness, cyanosis or clubbing  SKIN: warm and dry without lesions    NEURO:  alert, approp, no deficits       I personally reviewed images and agree with radiology impression as follows:  CT chest 09/07/15 1. 1.9 x 2.9 cm ground-glass nodule identified in the left lower lobe. Initial follow-up by chest CT without contrast is recommended  in 3 months to confirm persistence.     Assessment & Plan:

## 2015-10-04 ENCOUNTER — Encounter: Payer: Self-pay | Admitting: Internal Medicine

## 2015-10-04 ENCOUNTER — Telehealth: Payer: Self-pay | Admitting: *Deleted

## 2015-10-04 DIAGNOSIS — R058 Other specified cough: Secondary | ICD-10-CM | POA: Insufficient documentation

## 2015-10-04 DIAGNOSIS — R05 Cough: Secondary | ICD-10-CM | POA: Insufficient documentation

## 2015-10-04 DIAGNOSIS — R911 Solitary pulmonary nodule: Secondary | ICD-10-CM | POA: Insufficient documentation

## 2015-10-04 NOTE — Assessment & Plan Note (Signed)
-   S/p smoking cessation in 2004  - See CT chest 09/07/15 > GG nodule/ LL seen on plain cxr also  >  rec f/u ov 12/08/15 (placed in tickle with pfts then too)  CT results reviewed with pt >>> Too small for PET or bx, not suspicious enough for excisional bx > really only option for now is follow the Fleischner society guidelines as rec by radiology = 3 month f/u - since easily seen on cxr no need for repeat CT at f/u in 2 months but will do pfts in case needs excisional bx/ lobectomy

## 2015-10-04 NOTE — Assessment & Plan Note (Signed)
Cath 05/27/15  1. 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 The vein graft to the PDA is occluded since last cath.  2. Occluded proximal LAD with filling of the mid and distal vessel by the patent LIMA graft. Occluded SVG to Diagonal.  3. Moderate disease in the moderate caliber intermediate branch, unchanged from last cath. Occluded SVG to OM 4. Mild disease in the AV groove Circumflex 5. Severe stenosis in the small caliber posterolateral branch at the site of the insertion of the vein graft. PCI of this vessel was attempted in 2014 but was not successful. SVG to PDA is now occluded.  6. Normal LV systolic function 7. Moderate mitral regurgitation  I suspect this is the explanation for his doe and not lung dz but will first treat GERD and bring him back for full pfts when do the f/u for his SPN

## 2015-10-04 NOTE — Assessment & Plan Note (Addendum)
The most common causes of chronic cough in immunocompetent adults include the following: upper airway cough syndrome (UACS), previously referred to as postnasal drip syndrome (PNDS), which is caused by variety of rhinosinus conditions; (2) asthma; (3) GERD; (4) chronic bronchitis from cigarette smoking or other inhaled environmental irritants; (5) nonasthmatic eosinophilic bronchitis; and (6) bronchiectasis.   These conditions, singly or in combination, have accounted for up to 94% of the causes of chronic cough in prospective studies.   Other conditions have constituted no >6% of the causes in prospective studies These have included bronchogenic carcinoma, chronic interstitial pneumonia, sarcoidosis, left ventricular failure, ACEI-induced cough, and aspiration from a condition associated with pharyngeal dysfunction.    Chronic cough is often simultaneously caused by more than one condition. A single cause has been found from 38 to 82% of the time, multiple causes from 18 to 62%. Multiply caused cough has been the result of three diseases up to 42% of the time.       Based on hx and exam, this is most likely:  Classic Upper airway cough syndrome, so named because it's frequently impossible to sort out how much is  CR/sinusitis with freq throat clearing (which can be related to primary GERD)   vs  causing  secondary (" extra esophageal")  GERD from wide swings in gastric pressure that occur with throat clearing, often  promoting self use of mint and menthol lozenges that reduce the lower esophageal sphincter tone and exacerbate the problem further in a cyclical fashion.   These are the same pts (now being labeled as having "irritable larynx syndrome" by some cough centers) who not infrequently have a history of having failed to tolerate ace inhibitors,  dry powder inhalers or biphosphonates or report having atypical reflux symptoms that don't respond to standard doses of PPI , and are easily confused as  having aecopd or asthma flares by even experienced allergists/ pulmonologists.   The first step is to maximize GERD RX/ try to help him stop clearing his throat  and eliminate cyclical coughing with use of 1st gen H1 if tol to eliminate pnds then regroup if the cough persists.  I had an extended discussion with the patient reviewing all relevant studies completed to date and  lasting 35 min      Each maintenance medication was reviewed in detail including most importantly the difference between maintenance and prns and under what circumstances the prns are to be triggered using an action plan format that is not reflected in the computer generated alphabetically organized AVS.    Please see instructions for details which were reviewed in writing and the patient given a copy highlighting the part that I personally wrote and discussed at today's ov.   See instructions for specific recommendations which were reviewed directly with the patient who was given a copy with highlighter outlining the key components.

## 2015-10-04 NOTE — Telephone Encounter (Signed)
NO appt was scheduled from yesterday's OV. Per MW instructions: Please schedule a follow up visit in 2  months but call sooner if needed with cxr --  I called spoke with spouse. She reports pt is currently out of town with an emergency. She reports she will inform pt to call us once he gets back in town (which unsure when this will be).

## 2015-10-04 NOTE — Telephone Encounter (Signed)
-----   Message from Nyoka CowdenMichael B Wert, MD sent at 10/04/2015  6:09 AM EST ----- Needs pfts on return

## 2015-11-01 ENCOUNTER — Telehealth: Payer: Self-pay | Admitting: *Deleted

## 2015-11-01 NOTE — Telephone Encounter (Signed)
-----   Message from Nyoka CowdenMichael B Wert, MD sent at 10/04/2015  6:09 AM EST ----- Be sure has f/u ov/cxr by now

## 2015-11-01 NOTE — Telephone Encounter (Signed)
Due for ov with cxr 12/02/14 LMTCB

## 2015-11-03 NOTE — Telephone Encounter (Signed)
LMTCB

## 2015-11-04 NOTE — Telephone Encounter (Signed)
lmtcb x3 for pt. 

## 2015-11-08 ENCOUNTER — Encounter: Payer: Self-pay | Admitting: *Deleted

## 2015-11-08 NOTE — Telephone Encounter (Signed)
I have left another msg and mailed letter to pt to have him call for ov

## 2015-11-28 ENCOUNTER — Other Ambulatory Visit: Payer: Self-pay | Admitting: Family Medicine

## 2015-11-28 ENCOUNTER — Other Ambulatory Visit: Payer: Self-pay | Admitting: Internal Medicine

## 2015-11-30 ENCOUNTER — Telehealth: Payer: Self-pay | Admitting: *Deleted

## 2015-11-30 ENCOUNTER — Telehealth: Payer: Self-pay | Admitting: Cardiovascular Disease

## 2015-11-30 MED ORDER — METOPROLOL SUCCINATE ER 25 MG PO TB24: 25.0000 mg | ORAL_TABLET | Freq: Every day | ORAL | Status: DC

## 2015-11-30 MED ORDER — FUROSEMIDE 20 MG PO TABS: 20.0000 mg | ORAL_TABLET | Freq: Every day | ORAL | Status: DC

## 2015-11-30 NOTE — Telephone Encounter (Signed)
Received message from refill team that pt's wife is asking Dr. Clifton JamesMcAlhany to refill Celexa.  I reviewed with Dr. Clifton JamesMcAlhany and he will not authorize refills for this.  Pt will need to contact primary care.  I spoke with pt's wife and gave her this information.

## 2015-11-30 NOTE — Telephone Encounter (Signed)
°*  STAT* If patient is at the pharmacy, call can be transferred to refill team.   1. Which medications need to be refilled? (please list name of each medication and dose if known) Motoporolol 25.mg & Laxix 40mg  2. Which pharmacy/location (including street and city if local pharmacy) is medication to be sent to? [facility]    [phone]  3. Do they need a 30 day or 90 day supply?90

## 2015-11-30 NOTE — Telephone Encounter (Signed)
Pt's Rx was sent to pt's pharmacy as requested, asking pt to schedule an f/u appointment as well. Confirmation received.

## 2015-12-01 ENCOUNTER — Telehealth: Payer: Self-pay | Admitting: Family Medicine

## 2015-12-01 NOTE — Telephone Encounter (Signed)
Pt called and would like a refill on his Celexa called in. The pharmacy sent this to the wrong doctor and it was refused. Patient only has about 2 days worth of medication left. jw

## 2015-12-02 ENCOUNTER — Other Ambulatory Visit: Payer: Self-pay | Admitting: Family Medicine

## 2015-12-02 MED ORDER — CITALOPRAM HYDROBROMIDE 40 MG PO TABS: 40.0000 mg | ORAL_TABLET | Freq: Every day | ORAL | Status: DC

## 2015-12-02 NOTE — Telephone Encounter (Signed)
Pt is calling to check the status of his refill request on Celexa. jw

## 2015-12-02 NOTE — Telephone Encounter (Signed)
Refill sent.

## 2015-12-07 ENCOUNTER — Telehealth: Payer: Self-pay | Admitting: Cardiovascular Disease

## 2015-12-07 NOTE — Telephone Encounter (Signed)
Called pt and spoke with pt's wife Marylene Land explaining to her that I will be leaving two weeks supply of the Ranexa 500 mg tablet for the pt and that we needed for the pt to apply for the assistant program or we could send in refills to the pt's pharmacy, because the samples are for pt just starting on the medication. Wife stated that they would like to apply for the program. I am also leaving a application up front as well. Wife verbalized understanding.

## 2015-12-07 NOTE — Telephone Encounter (Signed)
New message  ° ° ° °Patient calling the office for samples of medication: ° ° °1.  What medication and dosage are you requesting samples for? ranexa 500 mg  ° °2.  Are you currently out of this medication? Yes  ° ° °

## 2015-12-14 ENCOUNTER — Other Ambulatory Visit: Payer: Self-pay | Admitting: Cardiovascular Disease

## 2015-12-14 ENCOUNTER — Other Ambulatory Visit: Payer: Self-pay | Admitting: Family Medicine

## 2015-12-14 ENCOUNTER — Other Ambulatory Visit: Payer: Self-pay | Admitting: *Deleted

## 2015-12-14 DIAGNOSIS — R05 Cough: Secondary | ICD-10-CM

## 2015-12-14 DIAGNOSIS — R059 Cough, unspecified: Secondary | ICD-10-CM

## 2015-12-14 MED ORDER — MOMETASONE FUROATE 50 MCG/ACT NA SUSP: 1.0000 | Freq: Every day | NASAL | Status: DC

## 2015-12-26 ENCOUNTER — Ambulatory Visit (INDEPENDENT_AMBULATORY_CARE_PROVIDER_SITE_OTHER): Payer: Medicare HMO | Admitting: Family Medicine

## 2015-12-26 ENCOUNTER — Encounter: Payer: Self-pay | Admitting: Family Medicine

## 2015-12-26 VITALS — BP 132/80 | HR 61 | Temp 98.0°F | Ht 66.0 in | Wt 182.0 lb

## 2015-12-26 DIAGNOSIS — R0982 Postnasal drip: Secondary | ICD-10-CM | POA: Diagnosis not present

## 2015-12-26 DIAGNOSIS — Z79899 Other long term (current) drug therapy: Secondary | ICD-10-CM | POA: Diagnosis not present

## 2015-12-26 DIAGNOSIS — I25708 Atherosclerosis of coronary artery bypass graft(s), unspecified, with other forms of angina pectoris: Secondary | ICD-10-CM

## 2015-12-26 LAB — COMPLETE METABOLIC PANEL WITH GFR
ALT: 23 U/L (ref 9–46)
AST: 22 U/L (ref 10–35)
Albumin: 4 g/dL (ref 3.6–5.1)
Alkaline Phosphatase: 87 U/L (ref 40–115)
BUN: 12 mg/dL (ref 7–25)
CALCIUM: 8.8 mg/dL (ref 8.6–10.3)
CHLORIDE: 105 mmol/L (ref 98–110)
CO2: 24 mmol/L (ref 20–31)
CREATININE: 0.97 mg/dL (ref 0.70–1.25)
GFR, EST NON AFRICAN AMERICAN: 84 mL/min (ref >=60)
Glucose, Bld: 91 mg/dL (ref 65–99)
POTASSIUM: 4.6 mmol/L (ref 3.5–5.3)
Sodium: 139 mmol/L (ref 135–146)
Total Bilirubin: 0.5 mg/dL (ref 0.2–1.2)
Total Protein: 6.7 g/dL (ref 6.1–8.1)

## 2015-12-26 MED ORDER — LORATADINE 10 MG PO TABS: 10.0000 mg | ORAL_TABLET | Freq: Every day | ORAL | Status: DC

## 2015-12-26 MED ORDER — FLUTICASONE PROPIONATE 50 MCG/ACT NA SUSP: 2.0000 | Freq: Every day | NASAL | Status: DC

## 2015-12-26 MED ORDER — PRAVASTATIN SODIUM 20 MG PO TABS: 40.0000 mg | ORAL_TABLET | Freq: Every day | ORAL | Status: DC

## 2015-12-26 NOTE — Patient Instructions (Signed)
It was a pleasure seeing you today in our clinic. Today we discussed your medications and postnasal drip. Here is the treatment plan we have discussed and agreed upon together:   - I've discontinued your nasal and started you on Flonase. Use 2 sprays in each nostril daily. - I have placed you back on a statin. This is a low dose but as we discussed in the clinic a low dose is better than no dose. This medication is called pravastatin. Take this one time daily. - I've also placed you on Claritin. Take this medication one time daily. I hope is that it will help with your postnasal drip. - Follow-up with me in one month if you feel necessary. Otherwise I would like to have you come in to have labs drawn in one month without a visit.

## 2015-12-26 NOTE — Progress Notes (Addendum)
Patient ID: Frank [name], male   DOB: May 31, 1956, 60 y.o.   MRN: [id number]   Subjective: CC:  Follow-up on hospitalization, inquiring about re-starting statin and sartan meds HPI: This history was provided by the patient and his wife.  Patient  has a past medical history of Mitral regurgitation; Low HDL (under 40); GERD (gastroesophageal reflux disease); Hypertension; CAD (coronary artery disease); CHF (congestive heart failure) (HCC); Depression; Numbness and tingling in hands; Numbness and tingling of both legs; Umbilical hernia; Arthritis; Chronic lower back pain; Anxiety; HLD (hyperlipidemia); and Myocardial infarction (HCC) (2004).   Patient is a 60 y.o. male presenting to clinic today for follow-up regarding hospitalization in September 2016.  During that hospitalization, patient's Losartan and Simvastatin were discontinued due to elevated renal and liver enzymes and damage.  Patient and his wife would like to know if he should restart those medications.  Patient has no specific medical or psychological complaints.  Concerns today include:  1. Follow-up on medication discontinuation  Social History Reviewed: former smoker. FamHx and MedHx updated.  Please see EMR.  ROS: General:  10lb weight gain since September 2016.  Patient states, "I always gain weight in the winter."  Patient has baseline fatigue related to heart failure, particularly with exertion.  Patient denies new or excessive fatigue.  No unexplained weight loss, chills, fever or night sweats. HEENT: Endorses nasal congestion and post-nasal drip which he states is chronic.  Cough when recumbent which patient associated with post-nasal drip.  Denies otalgia, sore throat, otorrhea and rhinorrhea. Cardio: Denies chest pain and LE edema. Pulm: Denies new shortness of breath and wheezing.  Denies hemoptysis.  Former smoker GI: Diagnosed with an unbilical hernia.  Denies N/V/D, melena, hematemesis GU: Patient states urine has  returned to normal straw color.  Denies hematuria, dysuria and frequency/urgency. Neuro: Denis dizziness, changes in vision, headaches.   Psych:  History of depression.  Patient's father died last week.  He denies SI/HI and states he is handling his father's death well.    Objective: Office vital signs reviewed. BP 132/80 mmHg  Pulse 61  Temp(Src) 98 F (36.7 C) (Oral)  Ht  (1.676 m)  Wt 182 lb (82.555 kg)  BMI 29.39 kg/m2  Physical Examination:  General: Awake, alert, pleasant male who appears stated age.  Well-nourished, NAD and non-toxic in appearance. HEENT: Normal    Neck: No masses palpated. No LAD    Eyes: Sclera white with no injection.  No drainage noted.    Nose: nasal turbinates moist and plale Cardio: RRR, S1S2 heard, no murmurs appreciated, no pre-tibial edema bilaterally. No cyanosis or clubbing; +2 radial pulses bilaterally Pulm: Clear to auscultation bilaterally, no wheezes, rhonchi or rales GI: Non-tender, full abdomin.  +BS x4 GU: deferred  Neuro: Strength and sensation grossly intact Psych:  Well groomed, speech normal rate and volume, appropriate eye contact.  Patient expresses himself well and is alert and oriented to person, place, time and events. Patient's mood is relaxed and engaged.  Assessment/ Plan: 60 y.o. male presents to inquire as to whether he should restart Simvastatin and Losartan which were discontinued during September hospitalization.  At that time, patient's AST/ALT and creatinine were elevated.  Patient also complains of chronic cough, which he associates with post-nasal drip.  He is using Nasonex currently, but no antihistamine.    Patient's history of CAD warrants revisiting statin use.  Monitoring patient's creatinine given his kidney injury last year also suggested.  - Statin Start Pravastatin and patient  is to follow-up with cardiologist to have cholesterol rechecked.   Will check liver enzymes today in clinic.  - Post-nasal  drip Start Claritin and change Nasonex to Flonase to see if that provides more effective relief  Renal/Liver Function -  CMP with GFR done in clinic today   Follow-up: Follow-up with cardiologist as previously scheduled and return to office in 1 month for follow-up visit.   Nelly Rout, NP [facility]    Upper Level Addendum:  I have seen and evaluated this patient along with Ms. Manson Passey and reviewed the above note, making necessary revisions.  A/P: CAD: Patient was taken off of his statin during his previous admission to the hospital. Discontinuation of this medication was secondary to increased LFTs. Patient has not restarted any statin medication at this time. Due to his significant history for CAD and congestive heart failure I feels though it is imperative that he be placed on some lipid-lowering medication. Risk ~15% (using old lipid values) - I have initiated low-dose pravastatin - Obtain labs in one month to assess tolerance.  Postnasal drip: Seems to be relatively chronic in nature. Likely secondary to a suspected allergic rhinitis. Patient has been using Nasonex without any benefit. - Discontinue nascent and initiate Flonase (similar medications however patient may have a different response) - Initiate Claritin. - will follow   Kathee Delton, MD,MS,  PGY2 12/27/2015 10:09 PM

## 2015-12-27 DIAGNOSIS — R0982 Postnasal drip: Secondary | ICD-10-CM | POA: Insufficient documentation

## 2015-12-27 NOTE — Assessment & Plan Note (Signed)
Postnasal drip: Seems to be relatively chronic in nature. Likely secondary to a suspected allergic rhinitis. Patient has been using Nasonex without any benefit. - Discontinue nascent and initiate Flonase (similar medications however patient may have a different response) - Initiate Claritin. - will follow

## 2015-12-27 NOTE — Assessment & Plan Note (Signed)
CAD: Patient was taken off of his statin during his previous admission to the hospital. Discontinuation of this medication was secondary to increased LFTs. Patient has not restarted any statin medication at this time. Due to his significant history for CAD and congestive heart failure I feels though it is imperative that he be placed on some lipid-lowering medication. Risk ~15% (using old lipid values) - I have initiated low-dose pravastatin - Obtain labs in one month to assess tolerance.

## 2015-12-28 ENCOUNTER — Encounter: Payer: Self-pay | Admitting: Family Medicine

## 2016-02-01 ENCOUNTER — Emergency Department (HOSPITAL_COMMUNITY)
Admission: EM | Admit: 2016-02-01 | Discharge: 2016-02-01 | Disposition: A | Discharge status: Home / Self Care | Payer: Medicare HMO | Attending: Emergency Medicine | Admitting: Emergency Medicine

## 2016-02-01 ENCOUNTER — Emergency Department (HOSPITAL_COMMUNITY): Payer: Medicare HMO

## 2016-02-01 ENCOUNTER — Encounter (HOSPITAL_COMMUNITY): Payer: Self-pay

## 2016-02-01 DIAGNOSIS — J069 Acute upper respiratory infection, unspecified: Secondary | ICD-10-CM

## 2016-02-01 DIAGNOSIS — J40 Bronchitis, not specified as acute or chronic: Secondary | ICD-10-CM | POA: Diagnosis not present

## 2016-02-01 DIAGNOSIS — Z79899 Other long term (current) drug therapy: Secondary | ICD-10-CM | POA: Diagnosis not present

## 2016-02-01 DIAGNOSIS — E785 Hyperlipidemia, unspecified: Secondary | ICD-10-CM | POA: Insufficient documentation

## 2016-02-01 DIAGNOSIS — I2510 Atherosclerotic heart disease of native coronary artery without angina pectoris: Secondary | ICD-10-CM | POA: Insufficient documentation

## 2016-02-01 DIAGNOSIS — I509 Heart failure, unspecified: Secondary | ICD-10-CM | POA: Insufficient documentation

## 2016-02-01 DIAGNOSIS — Z7982 Long term (current) use of aspirin: Secondary | ICD-10-CM | POA: Insufficient documentation

## 2016-02-01 DIAGNOSIS — I252 Old myocardial infarction: Secondary | ICD-10-CM | POA: Insufficient documentation

## 2016-02-01 DIAGNOSIS — F329 Major depressive disorder, single episode, unspecified: Secondary | ICD-10-CM | POA: Diagnosis not present

## 2016-02-01 DIAGNOSIS — Z87891 Personal history of nicotine dependence: Secondary | ICD-10-CM | POA: Diagnosis not present

## 2016-02-01 DIAGNOSIS — R05 Cough: Secondary | ICD-10-CM | POA: Diagnosis present

## 2016-02-01 DIAGNOSIS — I110 Hypertensive heart disease with heart failure: Secondary | ICD-10-CM | POA: Diagnosis not present

## 2016-02-01 IMAGING — DX DG CHEST 2V
2 series · 2 of 2 positions shown · non-contrast
Comparison: [DATE] chest radiograph; chest CT [DATE]

CLINICAL DATA: Shortness of breath with cough and fever

EXAM:
CHEST  2 VIEW

[chest pa]
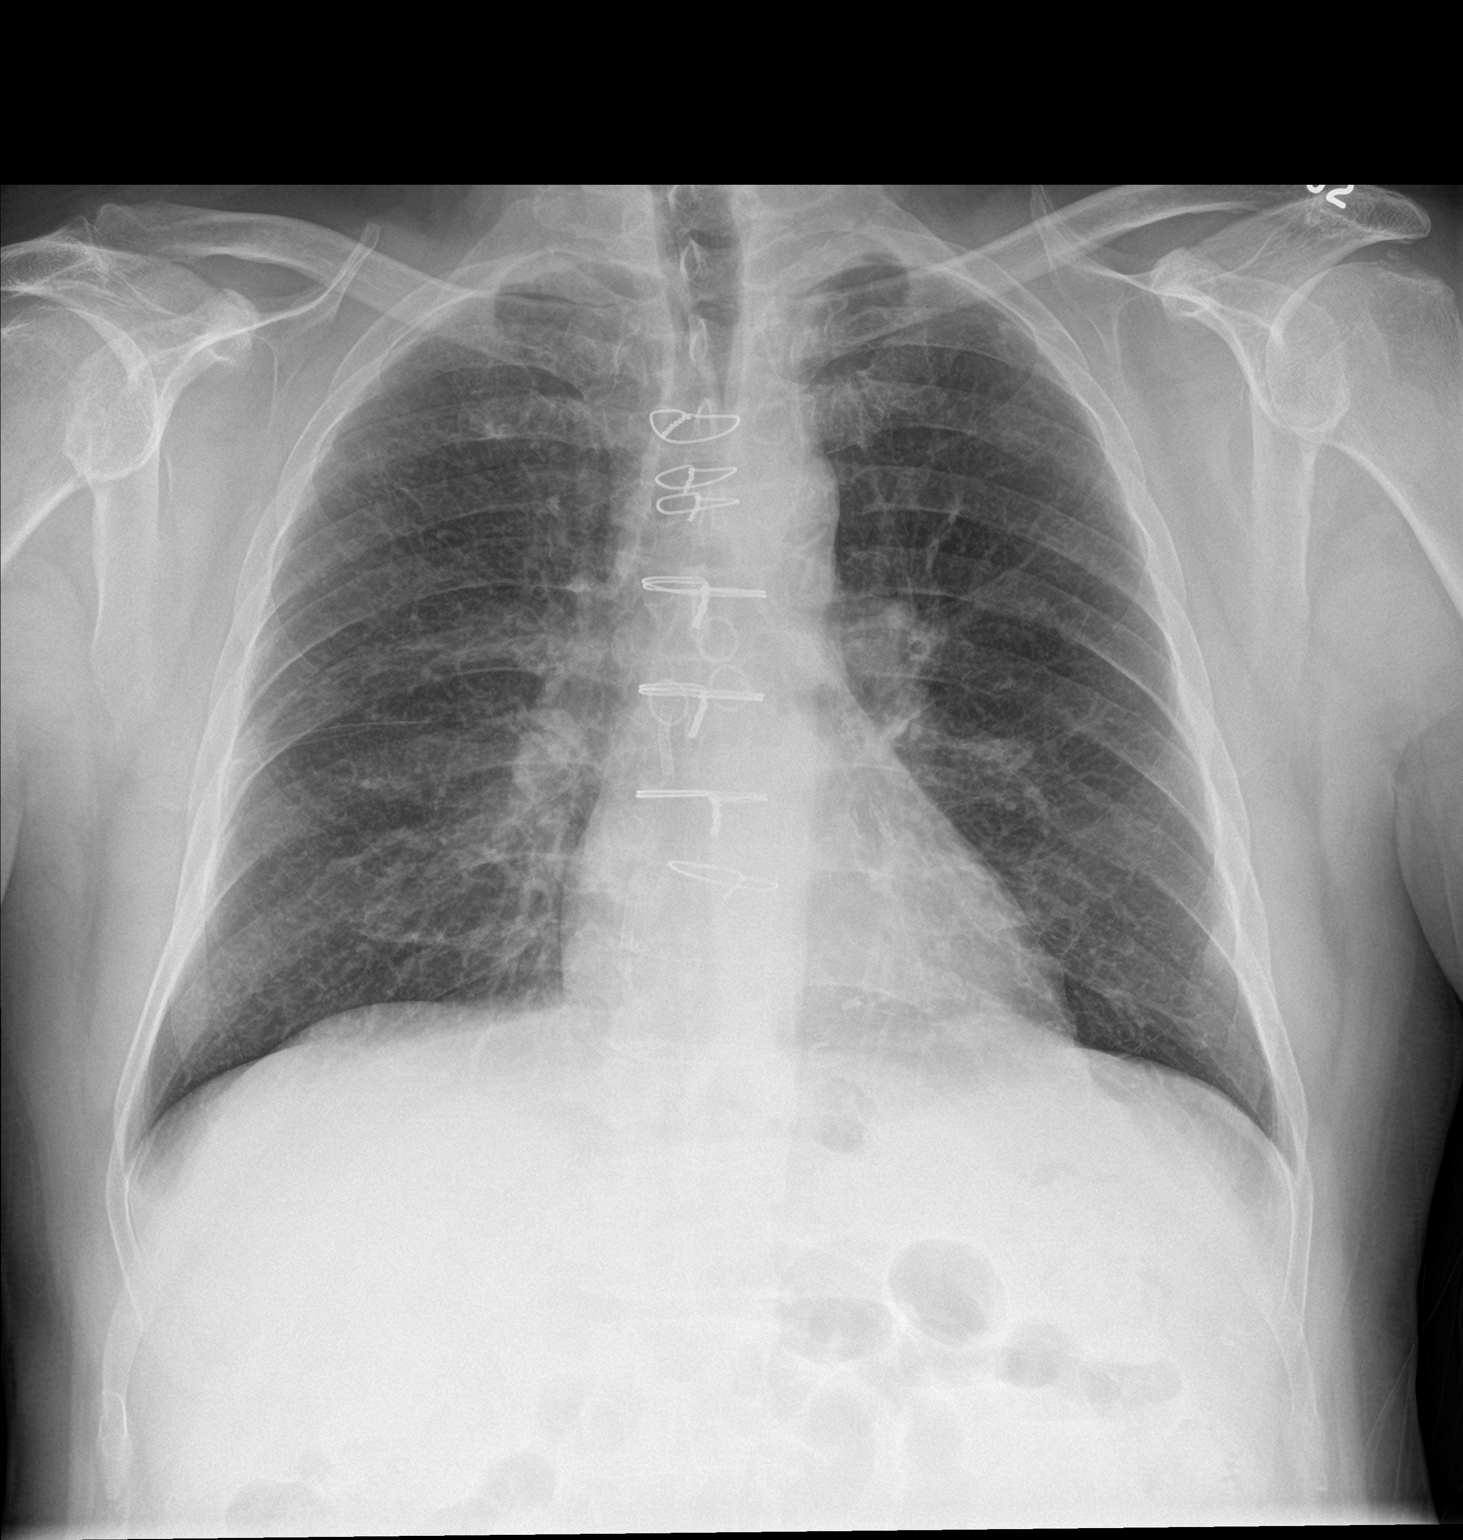

[chest lat]
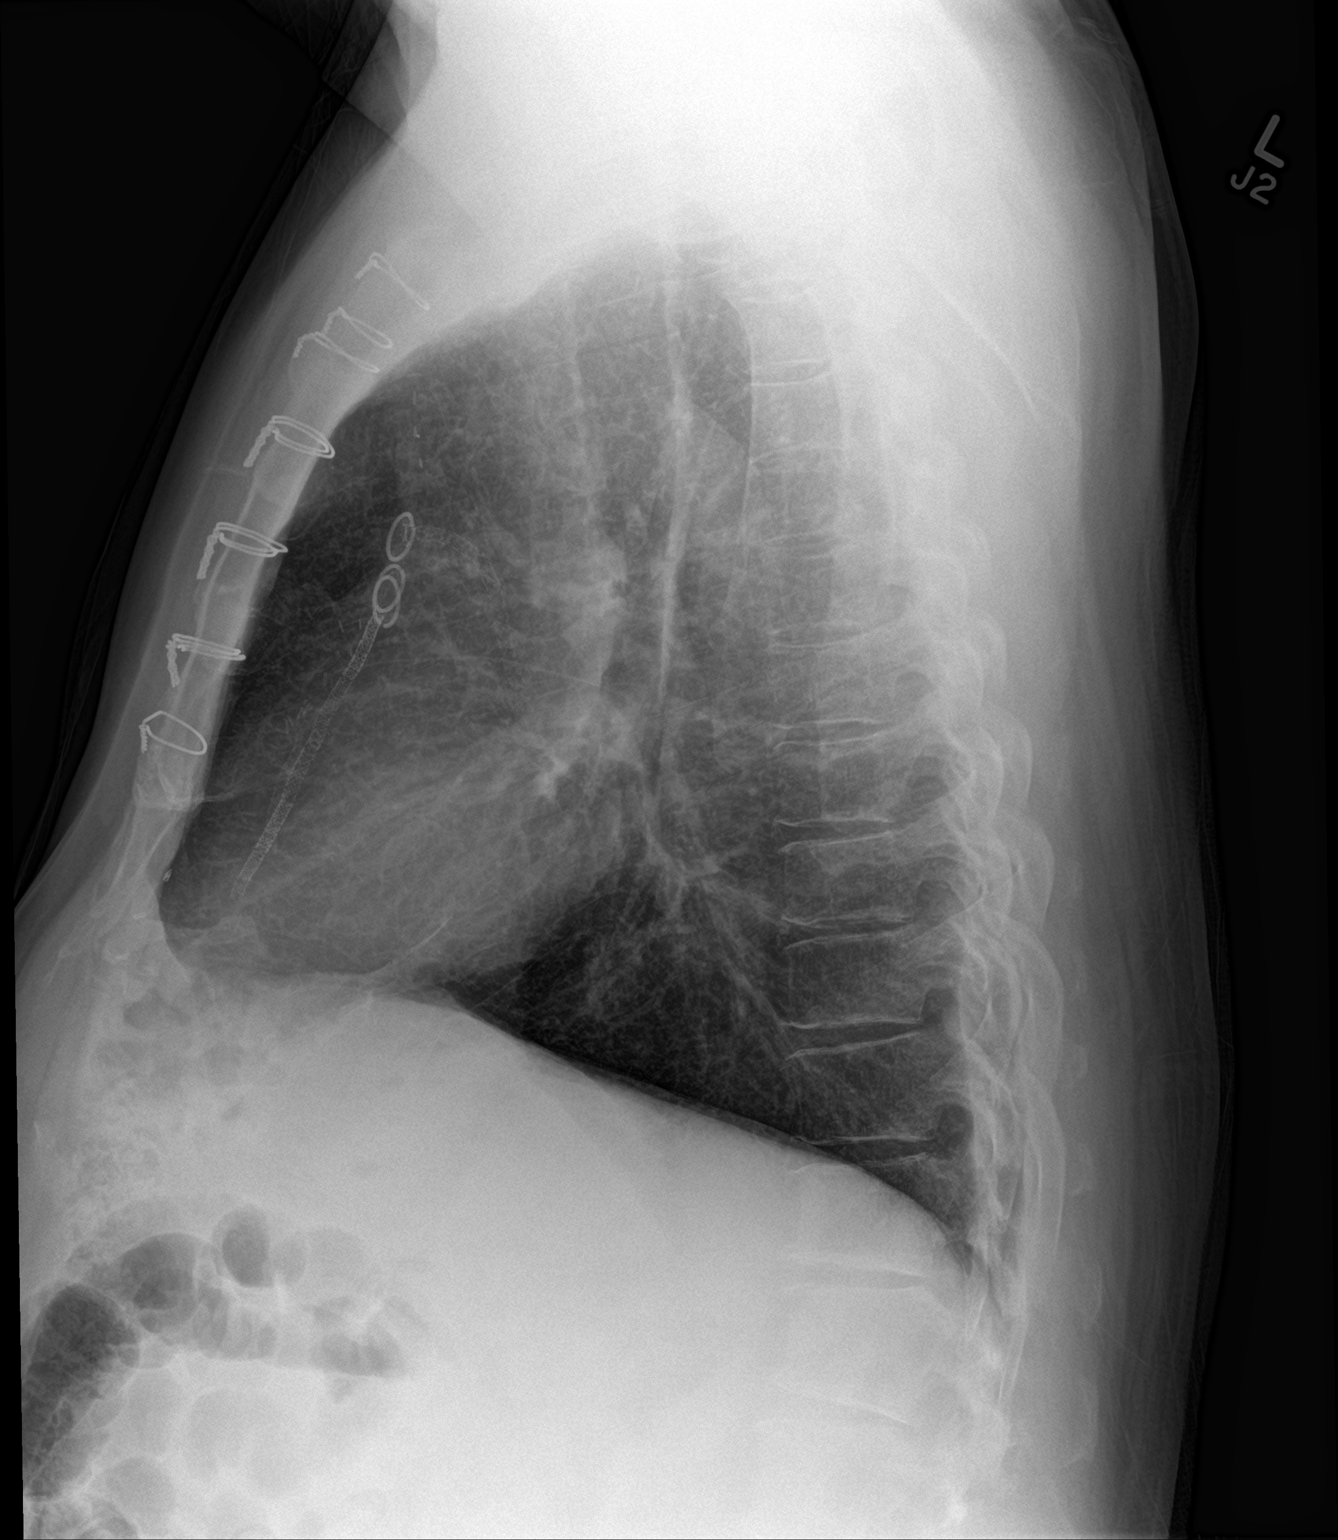

[2 of 2 positions shown; findings below may reference images not displayed]

FINDINGS: There has been interval clearing of opacity from the left base
compared to prior studies. There is minimal scarring in the left
base currently. No edema or consolidation. Heart size and pulmonary
vascularity are within normal limits. Patient is status post
coronary artery bypass grafting. No adenopathy. No bone lesions.
IMPRESSION: Slight scarring left base.  No edema or consolidation.

## 2016-02-01 MED ORDER — PREDNISONE 50 MG PO TABS
60.0000 mg | ORAL_TABLET | Freq: Once | ORAL | Status: AC
  Administered 2016-02-01: 60 mg via ORAL
  Filled 2016-02-01: qty 1

## 2016-02-01 MED ORDER — ACETAMINOPHEN 325 MG PO TABS
650.0000 mg | ORAL_TABLET | Freq: Once | ORAL | Status: AC
  Administered 2016-02-01: 650 mg via ORAL
  Filled 2016-02-01: qty 2

## 2016-02-01 MED ORDER — PROMETHAZINE-CODEINE 6.25-10 MG/5ML PO SYRP: 10.0000 mL | ORAL_SOLUTION | Freq: Four times a day (QID) | ORAL | Status: DC | PRN

## 2016-02-01 MED ORDER — OXYMETAZOLINE HCL 0.05 % NA SOLN
1.0000 | Freq: Once | NASAL | Status: AC
  Administered 2016-02-01: 1 via NASAL
  Filled 2016-02-01: qty 15

## 2016-02-01 MED ORDER — DEXAMETHASONE 4 MG PO TABS: 4.0000 mg | ORAL_TABLET | Freq: Two times a day (BID) | ORAL | Status: DC

## 2016-02-01 NOTE — Discharge Instructions (Signed)
Your chest xray is negative for pneumonia or heart failure.  Upper Respiratory Infection, Adult Most upper respiratory infections (URIs) are caused by a virus. A URI affects the nose, throat, and upper air passages. The most common type of URI is often called "the common cold." HOME CARE   Take medicines only as told by your doctor.  Gargle warm saltwater or take cough drops to comfort your throat as told by your doctor.  Use a warm mist humidifier or inhale steam from a shower to increase air moisture. This may make it easier to breathe.  Drink enough fluid to keep your pee (urine) clear or pale yellow.  Eat soups and other clear broths.  Have a healthy diet.  Rest as needed.  Go back to work when your fever is gone or your doctor says it is okay.  You may need to stay home longer to avoid giving your URI to others.  You can also wear a face mask and wash your hands often to prevent spread of the virus.  Use your inhaler more if you have asthma.  Do not use any tobacco products, including cigarettes, chewing tobacco, or electronic cigarettes. If you need help quitting, ask your doctor. GET HELP IF:  You are getting worse, not better.  Your symptoms are not helped by medicine.  You have chills.  You are getting more short of breath.  You have brown or red mucus.  You have yellow or brown discharge from your nose.  You have pain in your face, especially when you bend forward.  You have a fever.  You have puffy (swollen) neck glands.  You have pain while swallowing.  You have white areas in the back of your throat. GET HELP RIGHT AWAY IF:   You have very bad or constant:  Headache.  Ear pain.  Pain in your forehead, behind your eyes, and over your cheekbones (sinus pain).  Chest pain.  You have long-lasting (chronic) lung disease and any of the following:  Wheezing.  Long-lasting cough.  Coughing up blood.  A change in your usual mucus.  You have  a stiff neck.  You have changes in your:  Vision.  Hearing.  Thinking.  Mood. MAKE SURE YOU:   Understand these instructions.  Will watch your condition.  Will get help right away if you are not doing well or get worse.   This information is not intended to replace advice given to you by your health care provider. Make sure you discuss any questions you have with your health care provider.   Document Released: 04/23/2008 Document Revised: 03/22/2015 Document Reviewed: 02/10/2014 Elsevier Interactive Patient Education 2016 [employer] Please increase fluids. Use tylenol every 4 hours for fever and aching. Use 2 sprays of afrin every 12 hour for 5 days only. Decadron two times daily with food. Use promethazine-codeine cough medication for cough and congestion. This medication may cause drowsiness, use with caution.

## 2016-02-01 NOTE — ED Notes (Signed)
Pt reports had a cold for approx 4 weeks.  Reports he got better for about a week then started getting sick again. Reports "deep" cough, body aches, low grade fever.

## 2016-02-01 NOTE — ED Provider Notes (Signed)
CSN: [id number]     Arrival date & time 02/01/16  0730 History   First MD Initiated Contact with Patient 02/01/16 [phone]     Chief Complaint  Patient presents with  . Cough     (Consider location/radiation/quality/duration/timing/severity/associated sxs/prior Treatment) Patient is a 60 y.o. male presenting with cough. The history is provided by the patient.  Cough Cough characteristics:  Productive Sputum characteristics:  Clear Severity:  Moderate Onset quality:  Gradual Duration:  1 week Timing:  Intermittent Progression:  Worsening Chronicity:  New Smoker: no   Context: sick contacts, upper respiratory infection and weather changes   Relieved by:  Nothing Worsened by:  Nothing tried Associated symptoms: chills, headaches and sinus congestion   Associated symptoms: no fever and no rash   Risk factors: no recent travel     Past Medical History  Diagnosis Date  . Mitral regurgitation     Moderate by TEE 03/27/12  . Low HDL (under 40)   . GERD (gastroesophageal reflux disease)   . Hypertension   . CAD (coronary artery disease)     5V CABG in 2004; stent to RCA x1 and SVG to acute marginal x2 2011;  PTCA/DES x 2 body of SVG to PDA  05/20/12  . CHF (congestive heart failure) (HCC)      EF 55% by echo 03/27/2012  . Depression   . Numbness and tingling in hands   . Numbness and tingling of both legs   . Umbilical hernia     unrepaired (05/20/12)  . Arthritis     hands, shoulders, arms  . Chronic lower back pain     "w/activity"  . Anxiety   . HLD (hyperlipidemia)   . Myocardial infarction [facility]) 2004   Past Surgical History  Procedure Laterality Date  . Tee without cardioversion  03/27/2012    Procedure: TRANSESOPHAGEAL ECHOCARDIOGRAM (TEE);  Surgeon: Laurey Morale, MD;  Location: [facility] ENDOSCOPY;  Service: Cardiovascular;  Laterality: N/A;  to be done at 1100  . Coronary artery bypass graft  2004    5v CABG   . Incision and drainage of wound  2010    "from bite; maybe  snake or spider; almost lost LLE"  . Ptca  10/30/2013    DES  SVG    . Coronary angioplasty with stent placement  05/11    stent to RCA x 1 and SVG-acute marginal x2. EF normal  . Coronary angioplasty with stent placement  05/20/12    PTCA/DES x 2 body of SVG to PDA   . Left heart catheterization with coronary/graft angiogram N/A 05/20/2012    Procedure: LEFT HEART CATHETERIZATION WITH Isabel Caprice;  Surgeon: Kathleene Hazel, MD;  Location: [facility] CATH LAB;  Service: Cardiovascular;  Laterality: N/A;  . Left heart catheterization with coronary/graft angiogram N/A 10/30/2013    Procedure: LEFT HEART CATHETERIZATION WITH Isabel Caprice;  Surgeon: Kathleene Hazel, MD;  Location: [facility] CATH LAB;  Service: Cardiovascular;  Laterality: N/A;  . Percutaneous coronary stent intervention (pci-s)  10/30/2013    Procedure: PERCUTANEOUS CORONARY STENT INTERVENTION (PCI-S);  Surgeon: Kathleene Hazel, MD;  Location: [facility] CATH LAB;  Service: Cardiovascular;;  . Cardiac catheterization N/A 05/27/2015    Procedure: Left Heart Cath and Coronary Angiography;  Surgeon: Kathleene Hazel, MD;  Location: [facility] INVASIVE CV LAB;  Service: Cardiovascular;  Laterality: N/A;   Family History  Problem Relation Age of Onset  . Heart disease Mother   . Cancer Mother     ?  type  . Diabetes Father   . Hyperlipidemia Father   . Hypertension Father   . Stomach cancer Father   . Heart disease Brother [id number]    X 5 brothers   Social History  Substance Use Topics  . Smoking status: Former Smoker -- 3.00 packs/day for 40 years    Types: Cigarettes    Quit date: 01/23/2003  . Smokeless tobacco: Never Used  . 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 Negative for fever.  Respiratory: Positive for cough.   Musculoskeletal: Positive for back pain and arthralgias.  Skin:  Negative for rash.  Neurological: Positive for headaches.  Psychiatric/Behavioral: The patient is nervous/anxious.   All other systems reviewed and are negative.     Allergies  Sulfa antibiotics and Hydrocodone  Home Medications   Prior to Admission medications   Medication Sig Start Date End Date Taking? Authorizing Provider  aspirin EC 81 MG tablet Take 81 mg by mouth daily.    Historical Provider, MD  citalopram (CELEXA) 40 MG tablet Take 1 tablet (40 mg total) by mouth daily. 12/02/15   Kathee DeltonIan D McKeag, MD  clopidogrel (PLAVIX) 75 MG tablet TAKE 1 TABLET BY MOUTH ONCE A DAY. 05/24/15   Kathee DeltonIan D McKeag, MD  famotidine (PEPCID) 20 MG tablet TAKE (1) TABLET BY MOUTH AT BEDTIME. 11/28/15   Nyoka CowdenMichael B Wert, MD  fluticasone [facility](FLONASE) 50 MCG/ACT nasal spray Place 2 sprays into both nostrils daily. 12/26/15   Kathee DeltonIan D McKeag, MD  furosemide (LASIX) 20 MG tablet Take 1 tablet (20 mg total) by mouth daily. 11/30/15   Kathleene Hazelhristopher D McAlhany, MD  loratadine (CLARITIN) 10 MG tablet Take 1 tablet (10 mg total) by mouth daily. 12/26/15   Kathee DeltonIan D McKeag, MD  metoprolol succinate (TOPROL-XL) 25 MG 24 hr tablet Take 1 tablet (25 mg total) by mouth daily. Take with or immediately following a meal. 11/30/15   Kathleene Hazelhristopher D McAlhany, MD  Multiple Vitamin (MULTIVITAMIN WITH MINERALS) TABS tablet Take 1 tablet by mouth daily.    Historical Provider, MD  NITROSTAT 0.4 MG SL tablet DISSOLVE 1 TABLET UNDER TONGUE EVERY 5 MINUTES UP TO 15 MIN FOR CHESTPAIN. IF NO RELIEF CALL 911. 06/06/15   Kathleene Hazelhristopher D McAlhany, MD  pantoprazole (PROTONIX) 40 MG tablet Take 1 tablet (40 mg total) by mouth daily. 12/22/14   Elenora GammaSamuel L Bradshaw, MD  potassium chloride SA (K-DUR,KLOR-CON) 20 MEQ tablet TAKE 1 TABLET BY MOUTH ONCE A DAY. 12/14/15   Kathleene Hazelhristopher D McAlhany, MD  pravastatin (PRAVACHOL) 20 MG tablet Take 2 tablets (40 mg total) by mouth daily. 12/26/15   Kathee DeltonIan D McKeag, MD  ranolazine (RANEXA) 500 MG 12 hr tablet Take 1 tablet (500 mg total) by  mouth 2 (two) times daily. 06/24/15   Beatrice LecherScott T Weaver, PA-C  tetrahydrozoline-zinc (VISINE-AC) 0.05-0.25 % ophthalmic solution Place 2 drops into both eyes 3 (three) times daily as needed (itching/burning).    Historical Provider, MD   BP 135/91 mmHg  Pulse 88  Temp(Src) 98.4 F (36.9 C) (Oral)  Resp 20  Ht 5\' 6"  (1.676 m)  Wt 81.647 kg  BMI 29.07 kg/m2  SpO2 97% Physical Exam  Constitutional: He is oriented to person, place, and time. He appears well-developed and well-nourished.  Non-toxic appearance.  HENT:  Head: Normocephalic.  Right Ear: Tympanic membrane and external ear normal.  Left Ear:  Tympanic membrane and external ear normal.  Nasal congestion present.  Eyes: EOM and lids are normal. Pupils are equal, round, and reactive to light.  Neck: Normal range of motion. Neck supple. Carotid bruit is not present.  Cardiovascular: Normal rate, regular rhythm, normal heart sounds, intact distal pulses and normal pulses.   Pulmonary/Chest: Breath sounds normal. No respiratory distress.  Course breath sounds.  Abdominal: Soft. Bowel sounds are normal. There is no tenderness. There is no guarding.  Musculoskeletal: Normal range of motion. He exhibits no edema or tenderness.  Lymphadenopathy:       Head (right side): No submandibular adenopathy present.       Head (left side): No submandibular adenopathy present.    He has no cervical adenopathy.  Neurological: He is alert and oriented to person, place, and time. He has normal strength. No cranial nerve deficit or sensory deficit. He exhibits normal muscle tone. Coordination normal.  Skin: Skin is warm and dry.  Psychiatric: He has a normal mood and affect. His speech is normal.  Nursing note and vitals reviewed.   ED Course  Procedures (including critical care time) Labs Review Labs Reviewed - No data to display  Imaging Review Dg Chest 2 View  02/01/2016  CLINICAL DATA:  Shortness of breath with cough and fever EXAM: CHEST  2  VIEW COMPARISON:  August 23, 2015 chest radiograph; chest CT September 07, 2015 FINDINGS: There has been interval clearing of opacity from the left base compared to prior studies. There is minimal scarring in the left base currently. No edema or consolidation. Heart size and pulmonary vascularity are within normal limits. Patient is status post coronary artery bypass grafting. No adenopathy. No bone lesions. IMPRESSION: Slight scarring left base.  No edema or consolidation. Electronically Signed   By: Bretta Bang III M.D.   On: 02/01/2016 08:10   I have personally reviewed and evaluated these images and lab results as part of my medical decision-making.   EKG Interpretation None      MDM  Chest xray is negative for pneumonia or acute problem. Exam favors sinusitis and URI. Plan - to increase fluids. To use decadron, promethazine-codeine, and tylenol for symptoms. Pt to f/u with PCP if not improving.   Final diagnoses:  None    **I have reviewed nursing notes, vital signs, and all appropriate lab and imaging results for this patient.Ivery Quale, PA-C 02/01/16 1610  Bethann Berkshire, MD 02/01/16 1538

## 2016-02-01 NOTE — ED Notes (Signed)
Patient is resting comfortably. 

## 2016-02-05 IMAGING — US US ABDOMEN LIMITED
1 series · 14 of 25 positions shown · non-contrast
Comparison: CT [DATE] .

CLINICAL DATA: Elevated LFTs.  Pain.

EXAM:
US ABDOMEN LIMITED - RIGHT UPPER QUADRANT

[Series 1: us abdomen limited · 0.21mm/px · 14 of 51 slices shown]
[im 1/51]
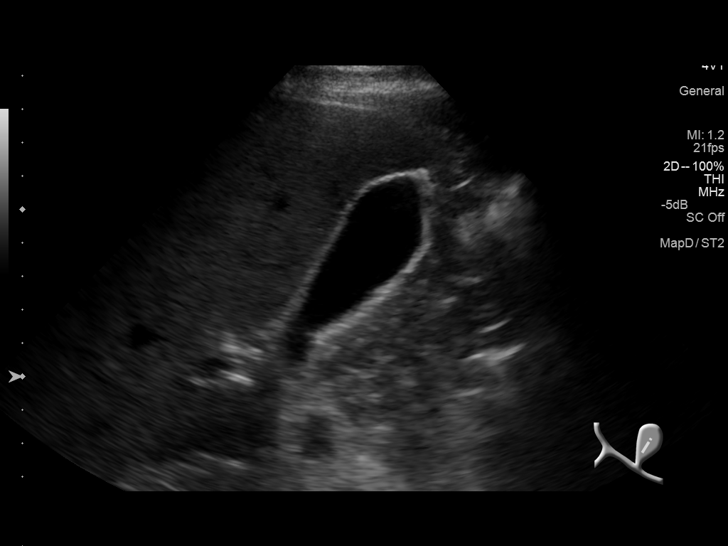
[im 5/51]
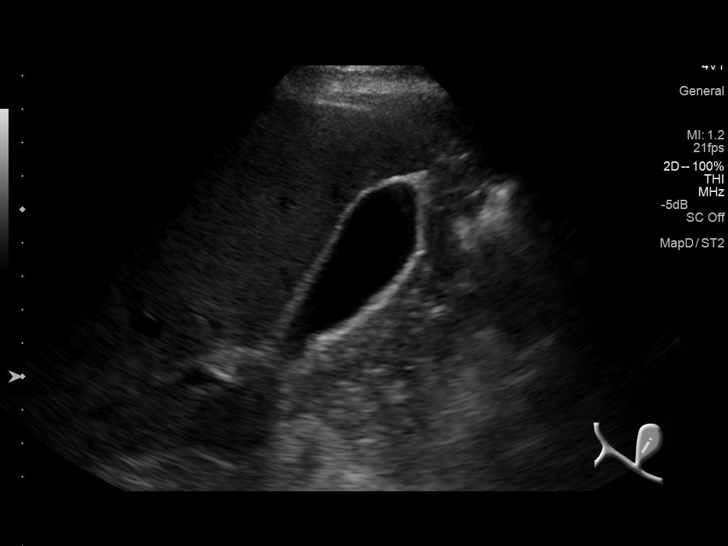
[im 9/51]
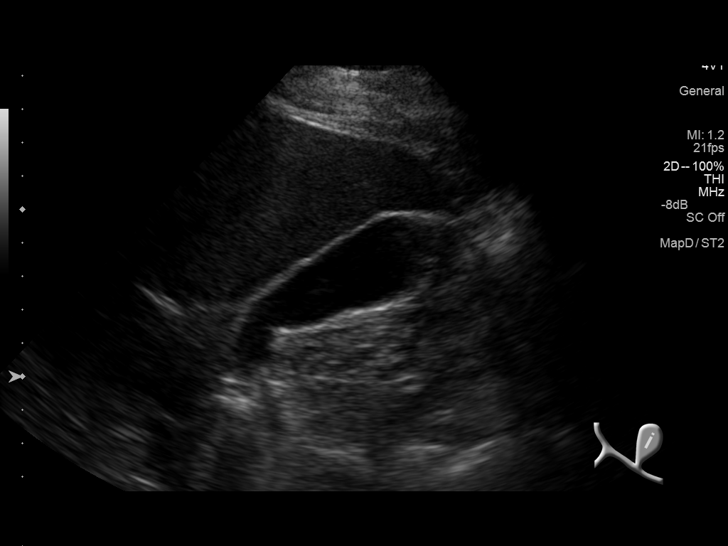
[im 13/51]
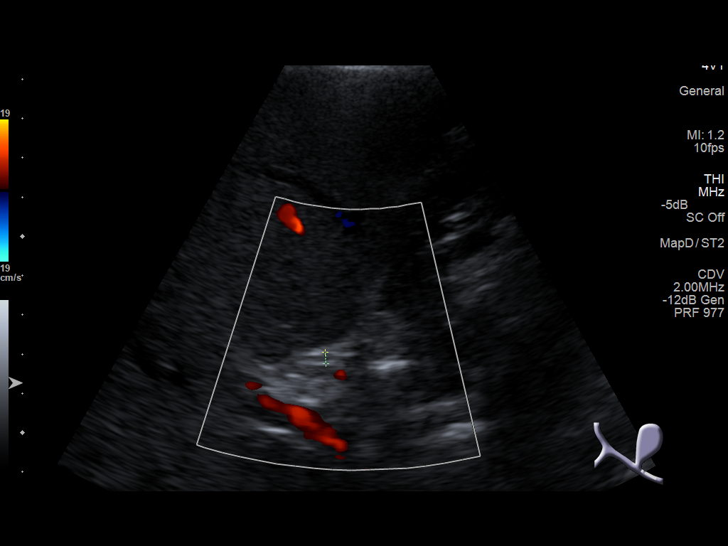
[im 17/51]
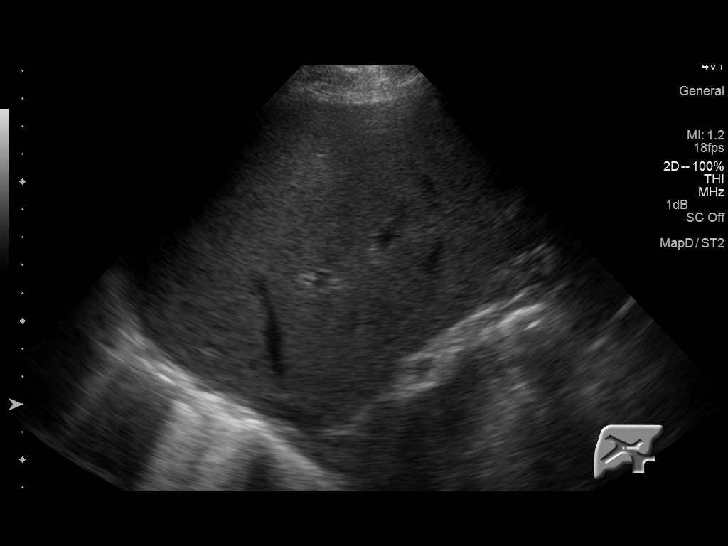
[im 19/51]
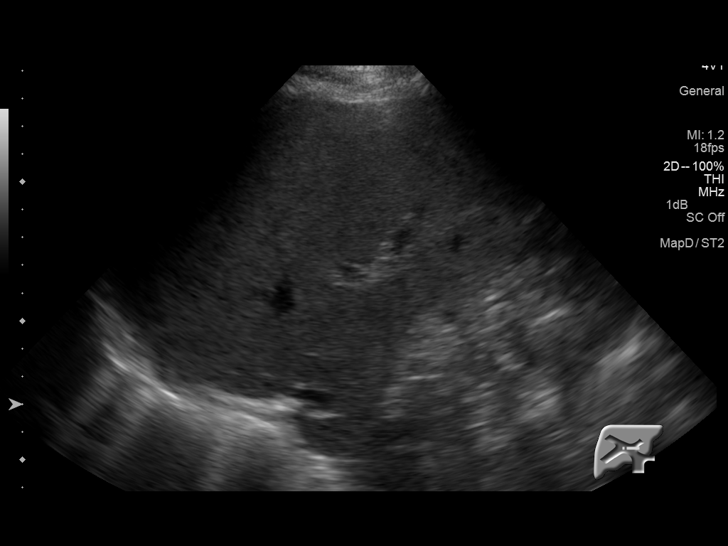
[im 23/51]
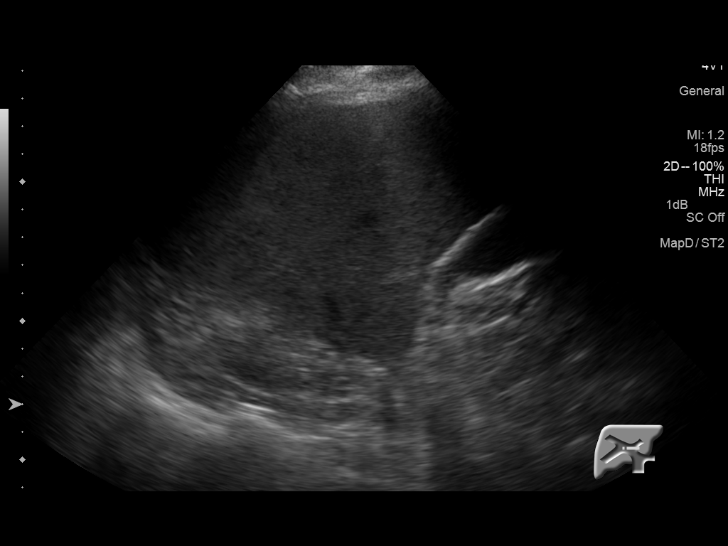
[im 28/51]
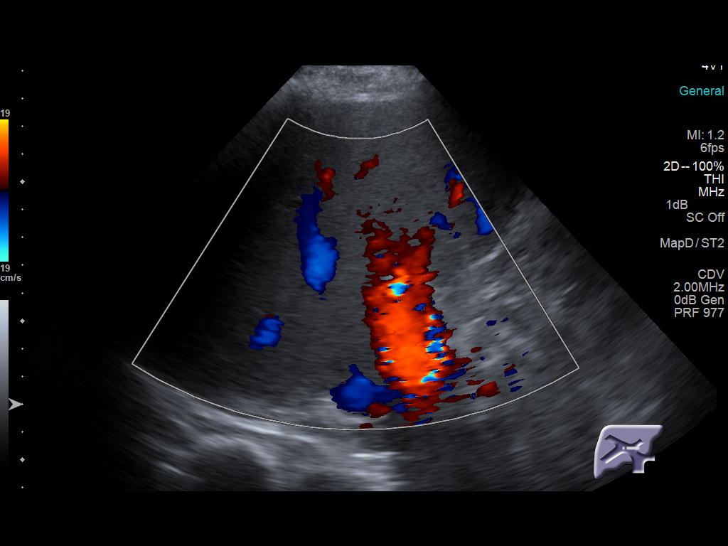
[im 32/51]
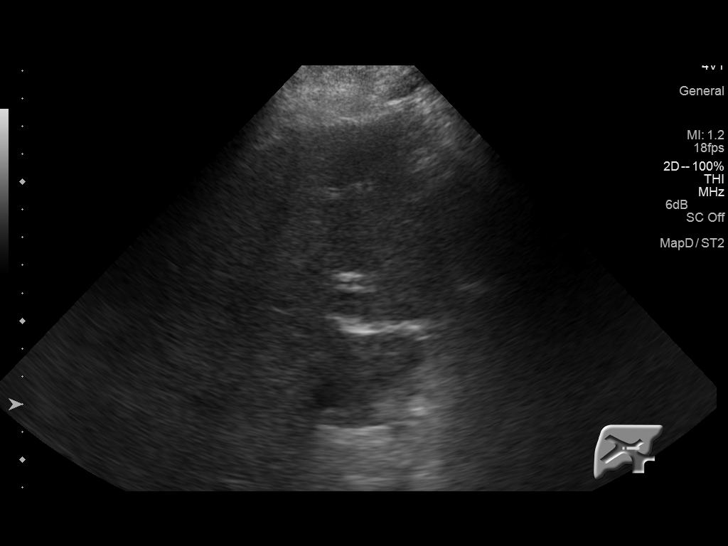
[im 34/51]
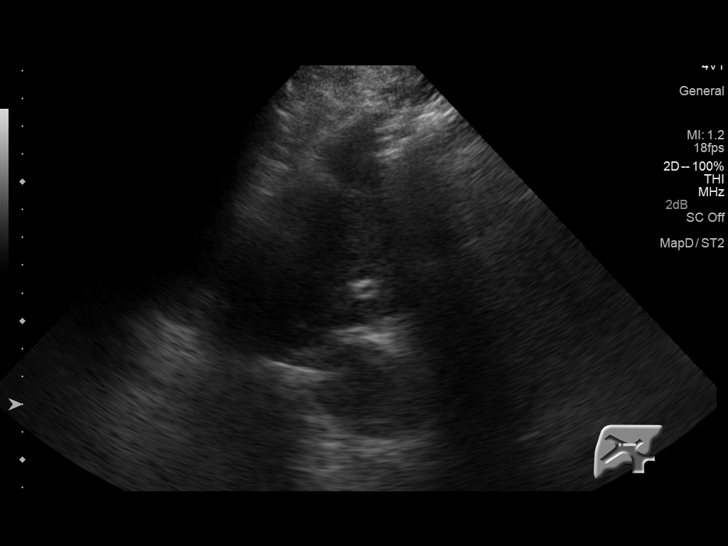
[im 38/51]
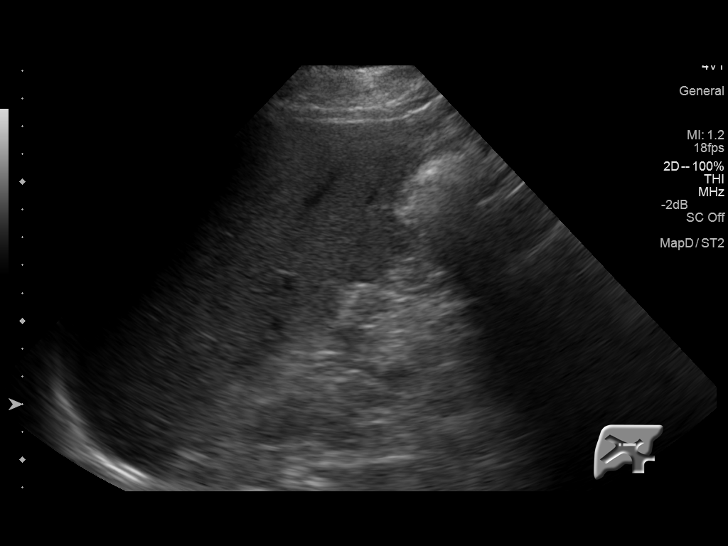
[im 42/51]
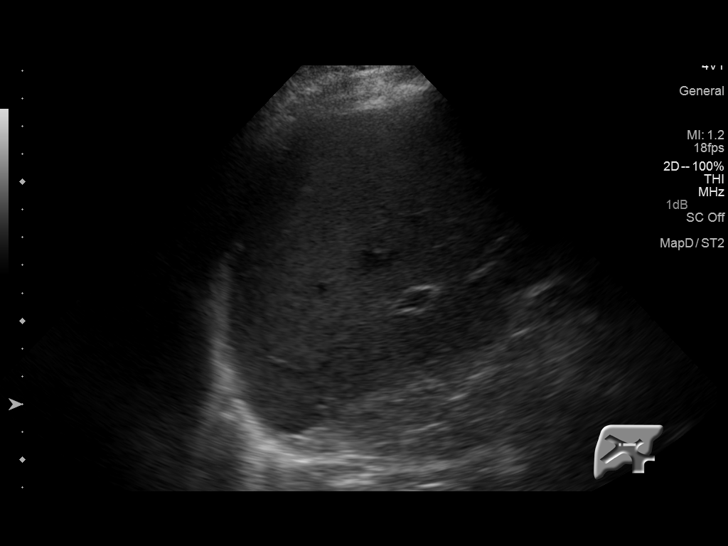
[im 46/51]
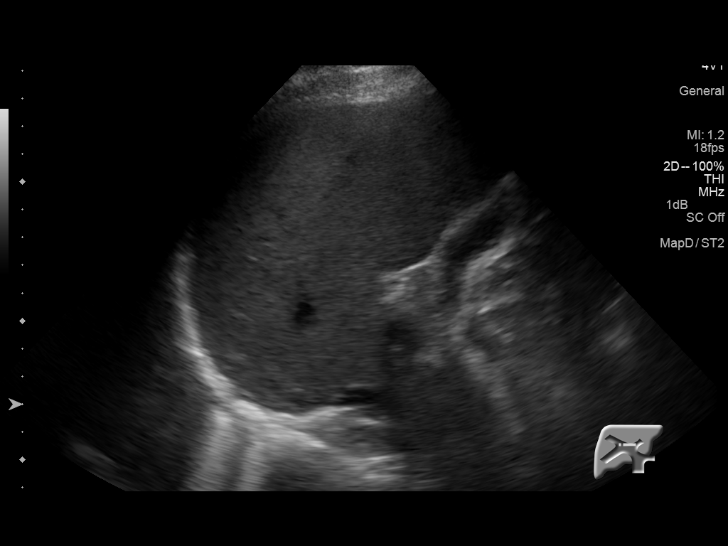
[im 51/51]
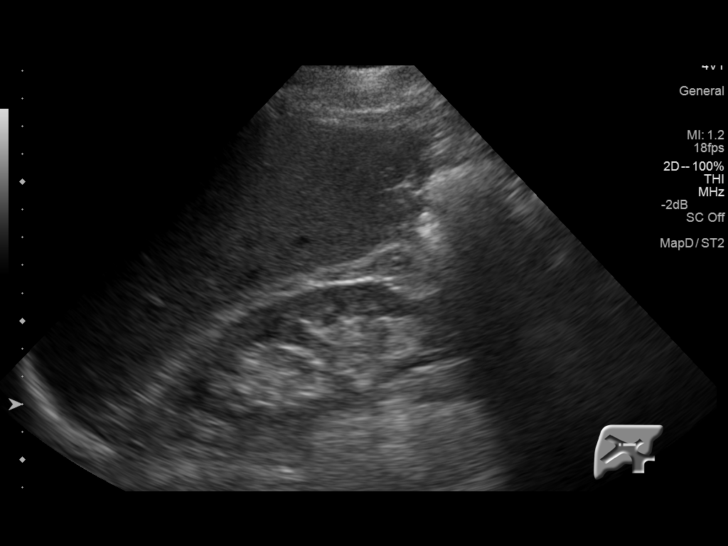

[14 of 25 positions shown; findings below may reference images not displayed]

FINDINGS: Gallbladder:

No gallstones or wall thickening visualized. No sonographic Murphy
sign noted.

Common bile duct:

Diameter: 2.9 mm

Liver:

No focal lesion identified. Within normal limits in parenchymal
echogenicity.
IMPRESSION: Negative exam.

## 2016-02-08 ENCOUNTER — Telehealth: Payer: Self-pay | Admitting: Family Medicine

## 2016-02-08 NOTE — Telephone Encounter (Signed)
Need an antibiotic sent to [facility] in [place].

## 2016-02-10 NOTE — Telephone Encounter (Signed)
Antibiotic for what? Patient has not been prescribed an antibiotic recently, and recent visit was in ED (not me).

## 2016-02-10 NOTE — Telephone Encounter (Signed)
LMOVM for pt to call us back. Dr cant prescribe a med that was given in the ED. Will need to come in for an appt. Erez Mccallum Bruna PotterBlount, CMA

## 2016-02-22 ENCOUNTER — Ambulatory Visit (INDEPENDENT_AMBULATORY_CARE_PROVIDER_SITE_OTHER): Payer: Medicare HMO | Admitting: Cardiovascular Disease

## 2016-02-22 ENCOUNTER — Encounter: Payer: Self-pay | Admitting: Cardiovascular Disease

## 2016-02-22 VITALS — BP 108/78 | HR 64 | Ht 66.0 in | Wt 180.1 lb

## 2016-02-22 DIAGNOSIS — I340 Nonrheumatic mitral (valve) insufficiency: Secondary | ICD-10-CM

## 2016-02-22 DIAGNOSIS — I5032 Chronic diastolic (congestive) heart failure: Secondary | ICD-10-CM

## 2016-02-22 DIAGNOSIS — E785 Hyperlipidemia, unspecified: Secondary | ICD-10-CM | POA: Diagnosis not present

## 2016-02-22 DIAGNOSIS — I10 Essential (primary) hypertension: Secondary | ICD-10-CM

## 2016-02-22 DIAGNOSIS — I2510 Atherosclerotic heart disease of native coronary artery without angina pectoris: Secondary | ICD-10-CM

## 2016-02-22 LAB — LIPID PANEL
CHOL/HDL RATIO: 3.9 ratio (ref <=5.0)
Cholesterol: 181 mg/dL (ref 125–200)
HDL: 47 mg/dL (ref >=40)
LDL CALC: 94 mg/dL (ref <130)
Triglycerides: 198 mg/dL — ABNORMAL HIGH (ref <150)
VLDL: 40 mg/dL — AB (ref <30)

## 2016-02-22 LAB — BASIC METABOLIC PANEL
BUN: 16 mg/dL (ref 7–25)
CALCIUM: 9.1 mg/dL (ref 8.6–10.3)
CHLORIDE: 104 mmol/L (ref 98–110)
CO2: 24 mmol/L (ref 20–31)
Creat: 1.09 mg/dL (ref 0.70–1.25)
Glucose, Bld: 96 mg/dL (ref 65–99)
POTASSIUM: 4.3 mmol/L (ref 3.5–5.3)
SODIUM: 138 mmol/L (ref 135–146)

## 2016-02-22 LAB — HEPATIC FUNCTION PANEL
ALBUMIN: 4.1 g/dL (ref 3.6–5.1)
ALT: 28 U/L (ref 9–46)
AST: 19 U/L (ref 10–35)
Alkaline Phosphatase: 139 U/L — ABNORMAL HIGH (ref 40–115)
BILIRUBIN TOTAL: 0.4 mg/dL (ref 0.2–1.2)
Bilirubin, Direct: 0.1 mg/dL (ref <=0.2)
Indirect Bilirubin: 0.3 mg/dL (ref 0.2–1.2)
Total Protein: 6.6 g/dL (ref 6.1–8.1)

## 2016-02-22 NOTE — Patient Instructions (Signed)
Medication Instructions:  Your physician recommends that you continue on your current medications as directed. Please refer to the Current Medication list given to you today.   Labwork: Lab work to be done today--BMP, Lipid and Liver profiles  Testing/Procedures: None   Follow-Up: Your physician wants you to follow-up in: 6 months.  You will receive a reminder letter in the mail two months in advance. If you don't receive a letter, please call our office to schedule the follow-up appointment.   Any Other Special Instructions Will Be Listed Below (If Applicable).     If you need a refill on your cardiac medications before your next appointment, please call your pharmacy.

## 2016-02-22 NOTE — Progress Notes (Signed)
Chief Complaint  Patient presents with  . Follow-up    pt c/o SOB     History of Present Illness: 60 yo male with a history of CAD s/p CABG 2004 and stenting in 2011, HLD, HTN, GERD and mitral regurgitation who is here today for cardiac follow up. He had been followed by [facility]. I met him in May 2013 for the first time. Echo 01/31/12 with normal LV size and function with at least moderate MR. He told me at the first visit that he felt fatigued and had SOB. He had no LE edema, orthopnea or chest pain. I arranged a TEE on 03/27/12 to evaluate his MR and this showed mild to moderate MR. At his f/u visit 05/06/12, he had c/o exertional chest pressure, dyspnea and fatigue. I arranged a cardiac cath on 05/20/12. There was severe disease in the SVG to the PDA and 2 DES were placed in the SVG to the PDA. The LIMA was patent but the vein grafts to the OM, Diagonal and PLA were occluded. He had recurrence of angina December 2014. Repeat cardiac cath 10/30/13 with patent LIMA to LAD, patent SVG to PDA with severe disease. Unchanged disease in native vessels as below. 2 drug eluting stents were placed in the SVG to the PDA. Echo August 2015 with normal LV function, mild MR. He was seen here in July 2016 with recurrent chest pain. Cardiac cath July 2016 with new occlusion of SVG to PDA. The only patent graft was the LIMA to LAD. No targets for PCI. He was seen here in follow up August 2016 by Richardson Dopp, PA-C and Ranexa started.   He is here today for follow up.  No chest pain. He does have constant fatigue. No change in baseline dyspnea with exertion. Recent URI. No LE edema.   Primary Care Physician: [facility]  Past Medical History  Diagnosis Date  . Mitral regurgitation     Moderate by TEE 03/27/12  . Low HDL (under 40)   . GERD (gastroesophageal reflux disease)   . Hypertension   . CAD (coronary artery disease)     5V CABG in 2004; stent to RCA x1 and SVG to acute marginal x2  2011;  PTCA/DES x 2 body of SVG to PDA  05/20/12  . CHF (congestive heart failure) ([place])      EF 55% by echo 03/27/2012  . Depression   . Numbness and tingling in hands   . Numbness and tingling of both legs   . Umbilical hernia     unrepaired (05/20/12)  . Arthritis     hands, shoulders, arms  . Chronic lower back pain     "w/activity"  . Anxiety   . HLD (hyperlipidemia)   . Myocardial infarction [facility]) 2004    Past Surgical History  Procedure Laterality Date  . Tee without cardioversion  03/27/2012    Procedure: TRANSESOPHAGEAL ECHOCARDIOGRAM (TEE);  Surgeon: Larey Dresser, MD;  Location: [facility] ENDOSCOPY;  Service: Cardiovascular;  Laterality: N/A;  to be done at 1100  . Coronary artery bypass graft  2004    5v CABG   . Incision and drainage of wound  2010    "from bite; maybe snake or spider; almost lost LLE"  . Ptca  10/30/2013    DES  SVG    . Coronary angioplasty with stent placement  05/11    stent to RCA x 1 and SVG-acute marginal x2. EF normal  . Coronary angioplasty  with stent placement  05/20/12    PTCA/DES x 2 body of SVG to PDA   . Left heart catheterization with coronary/graft angiogram N/A 05/20/2012    Procedure: LEFT HEART CATHETERIZATION WITH Beatrix Fetters;  Surgeon: Burnell Blanks, MD;  Location: [facility] CATH LAB;  Service: Cardiovascular;  Laterality: N/A;  . Left heart catheterization with coronary/graft angiogram N/A 10/30/2013    Procedure: LEFT HEART CATHETERIZATION WITH Beatrix Fetters;  Surgeon: Burnell Blanks, MD;  Location: [facility] CATH LAB;  Service: Cardiovascular;  Laterality: N/A;  . Percutaneous coronary stent intervention (pci-s)  10/30/2013    Procedure: PERCUTANEOUS CORONARY STENT INTERVENTION (PCI-S);  Surgeon: Burnell Blanks, MD;  Location: [facility] CATH LAB;  Service: Cardiovascular;;  . Cardiac catheterization N/A 05/27/2015    Procedure: Left Heart Cath and Coronary Angiography;  Surgeon: Burnell Blanks, MD;   Location: [facility] CV LAB;  Service: Cardiovascular;  Laterality: N/A;    Current Outpatient Prescriptions  Medication Sig Dispense Refill  . aspirin EC 81 MG tablet Take 81 mg by mouth daily.    . citalopram (CELEXA) 40 MG tablet Take 1 tablet (40 mg total) by mouth daily. 90 tablet 3  . clopidogrel (PLAVIX) 75 MG tablet TAKE 1 TABLET BY MOUTH ONCE A DAY. 90 tablet 3  . famotidine (PEPCID) 20 MG tablet TAKE (1) TABLET BY MOUTH AT BEDTIME. 30 tablet 0  . fluticasone (FLONASE) 50 MCG/ACT nasal spray Place 2 sprays into both nostrils daily. 16 g 6  . furosemide (LASIX) 20 MG tablet Take 1 tablet (20 mg total) by mouth daily. 90 tablet 0  . metoprolol succinate (TOPROL-XL) 25 MG 24 hr tablet Take 1 tablet (25 mg total) by mouth daily. Take with or immediately following a meal. 90 tablet 0  . Multiple Vitamin (MULTIVITAMIN WITH MINERALS) TABS tablet Take 1 tablet by mouth daily.    Marland Kitchen NITROSTAT 0.4 MG SL tablet DISSOLVE 1 TABLET UNDER TONGUE EVERY 5 MINUTES UP TO 15 MIN FOR CHESTPAIN. IF NO RELIEF CALL 911. 25 tablet 6  . pantoprazole (PROTONIX) 40 MG tablet Take 1 tablet (40 mg total) by mouth daily. 90 tablet 3  . potassium chloride SA (K-DUR,KLOR-CON) 20 MEQ tablet TAKE 1 TABLET BY MOUTH ONCE A DAY. 90 tablet 0  . pravastatin (PRAVACHOL) 20 MG tablet Take 2 tablets (40 mg total) by mouth daily. 90 tablet 3  . ranolazine (RANEXA) 500 MG 12 hr tablet Take 1 tablet (500 mg total) by mouth 2 (two) times daily. 60 tablet 11  . tetrahydrozoline-zinc (VISINE-AC) 0.05-0.25 % ophthalmic solution Place 2 drops into both eyes 3 (three) times daily as needed (itching/burning).     No current facility-administered medications for this visit.    Allergies  Allergen Reactions  . Sulfa Antibiotics Rash  . Hydrocodone Hives and Itching    Social History   Social History  . Marital Status: Married    Spouse Name: N/A  . Number of Children: 3  . Years of Education: N/A   Occupational History  .  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 Smoking status: Former Smoker -- 3.00 packs/day for 40 years    Types: Cigarettes    Quit date: 01/23/2003  . Smokeless tobacco: Never Used  . 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 Drug Use: No     Comment: 05/20/12 "last marijuana was 2004"  . Sexual Activity:  Yes   Other Topics Concern  . Not on file   Social History Narrative    Family History  Problem Relation Age of Onset  . Heart disease Mother   . Cancer Mother     ? type  . Diabetes Father   . Hyperlipidemia Father   . Hypertension Father   . Stomach cancer Father   . Heart disease Brother 106    X 5 brothers    Review of Systems:  As stated in the HPI and otherwise negative.   BP 108/78 mmHg  Pulse 64  Ht 5' 6"  (1.676 m)  Wt 180 lb 1.9 oz (81.702 kg)  BMI 29.09 kg/m2  SpO2 96%  Physical Examination: General: Well developed, well nourished, NAD HEENT: OP clear, mucus membranes moist SKIN: warm, dry. No rashes. Neuro: No focal deficits Musculoskeletal: Muscle strength 5/5 all ext Psychiatric: Mood and affect normal Neck: No JVD, no carotid bruits, no thyromegaly, no lymphadenopathy. Lungs:Clear bilaterally, no wheezes, rhonci, crackles Cardiovascular: Regular rate and rhythm. No murmurs, gallops or rubs. Abdomen:Soft. Bowel sounds present. Non-tender.  Extremities: No lower extremity edema. Pulses are 2 + in the bilateral DP/PT.   Cardiac cath July 2016: Left Anterior Descending  The vessel is large .   Marland Kitchen Ost LAD to Prox LAD lesion, 100% stenosed. chronic total occlusion .      Ramus Intermedius  The vessel is moderate in size .   Marland Kitchen Ramus lesion, 50% stenosed. diffuse .     Left Circumflex   . Second Obtuse Marginal Branch   The vessel is small in size.   Colon Flattery 2nd Mrg lesion, 80% stenosed. discrete . Very small caliber vessel.   . Third Obtuse Marginal Branch   The vessel is  small in size.     Right Coronary Artery   . Prox RCA lesion, 30% stenosed. diffuse .   Marland Kitchen Dist RCA lesion, 90% stenosed. discrete .   Marland Kitchen Right Posterior Descending Artery   The vessel is small in size.   . Right Posterior Atrioventricular Branch   The vessel is small in size.     Graft Angiography    Free LIMA Graft to Mid LAD  LIMA was injected is normal in caliber, and is anatomically normal.     Free Graft to 1st Diag  SVG was injected . There is severe disease in the graft. Graft is chronically occluded.   . Origin to Prox Graft lesion, 100% stenosed. chronic total occlusion .     Free Graft to 2nd Mrg  SVG was injected . There is severe disease in the graft. The graft is chronically occluded.   . Origin lesion, 100% stenosed. chronic total occlusion .     Free Graft to RPDA  SVG was injected . There is severe disease in the graft. The graft is occluded.   . Origin lesion, 100% stenosed. chronic total occlusion .         Echo August 2015: Left ventricle: The cavity size was normal. Systolic function was normal. The estimated ejection fraction was in the range of 55% to 60%. Wall motion was normal; there were no regional wall motion abnormalities. Doppler parameters are consistent with abnormal left ventricular relaxation (grade 1 diastolic dysfunction). There was no evidence of elevated ventricular filling pressure by Doppler parameters. - Aortic valve: Trileaflet; normal thickness leaflets. There was no regurgitation. - Aortic root: The aortic root was normal in size. - Mitral valve: Thickened anterior leaflet of the  mitral valve with mild prolapse. There was mild regurgitation directed posteriorly. - Left atrium: The atrium was mildly dilated. - Right ventricle: Systolic function was normal. - Right atrium: The atrium was normal in size. - Tricuspid valve: There was no regurgitation. - Pericardium, extracardiac: There was no pericardial  effusion. Impressions: - Normal biventricular size and systolic function. Mild prolapse of the anterior leaflet of the mitral valve with mild mitral regurgitation with posteriorly directed jet.  EKG:  EKG is not ordered today. The ekg ordered today demonstrates   Recent Labs: 08/23/2015: Hemoglobin 14.3; Platelets 324.0 12/26/2015: ALT 23; BUN 12; Creat 0.97; Potassium 4.6; Sodium 139   Lipid Panel    Component Value Date/Time   CHOL 140 07/08/2014 0909   TRIG 184* 07/08/2014 0909   HDL 36* 07/08/2014 0909   CHOLHDL 3.9 07/08/2014 0909   VLDL 37 07/08/2014 0909   LDLCALC 67 07/08/2014 0909     Wt Readings from Last 3 Encounters:  02/22/16 180 lb 1.9 oz (81.702 kg)  02/01/16 180 lb (81.647 kg)  12/26/15 182 lb (82.555 kg)     Other studies Reviewed: Additional studies/ records that were reviewed today include: . Review of the above records demonstrates:    Assessment and Plan:   1. CAD with stable angina: He has no chest pain. Will continue ASA, Plavix, beta blocker, Ranexa, statin.  2. HTN: BP well controlled. No changes. Will check BMET  3. Hyperlipidemia:  Lipids well controlled. He takes Pravachol every day. Check lipids today.   4. Mitral regurgitation: Mild by echo August 2015.    5. Acute on chronic diastolic CHF: Weight is stable. No changes. He is taking Lasix every day.    Current medicines are reviewed at length with the patient today.  The patient does not have concerns regarding medicines.  The following changes have been made:  no change  Labs/ tests ordered today include:   Orders Placed This Encounter  Procedures  . Basic Metabolic Panel (BMET)  . Lipid Profile  . Hepatic function panel    Disposition:   FU with me in   Signed, Lauree Chandler, MD 02/22/2016 8:45 AM    [address], [place], [place]  [address] Phone: [phone]; Fax: [phone]

## 2016-02-23 ENCOUNTER — Telehealth: Payer: Self-pay | Admitting: Cardiovascular Disease

## 2016-02-23 NOTE — Telephone Encounter (Signed)
Spoke with pt and reviewed lab results with him. 

## 2016-02-23 NOTE — Telephone Encounter (Signed)
Pt is calling back to get the results to his test.

## 2016-03-05 ENCOUNTER — Other Ambulatory Visit: Payer: Self-pay | Admitting: Cardiovascular Disease

## 2016-03-23 ENCOUNTER — Other Ambulatory Visit: Payer: Self-pay | Admitting: Family Medicine

## 2016-05-21 ENCOUNTER — Other Ambulatory Visit: Payer: Self-pay | Admitting: *Deleted

## 2016-05-21 MED ORDER — CLOPIDOGREL BISULFATE 75 MG PO TABS: 75.0000 mg | ORAL_TABLET | Freq: Every day | ORAL | Status: DC

## 2016-06-21 ENCOUNTER — Other Ambulatory Visit: Payer: Self-pay | Admitting: *Deleted

## 2016-06-21 MED ORDER — RANOLAZINE ER 500 MG PO TB12: 500.0000 mg | ORAL_TABLET | Freq: Two times a day (BID) | ORAL | 3 refills | Status: DC

## 2016-07-11 ENCOUNTER — Other Ambulatory Visit: Payer: Self-pay | Admitting: Cardiovascular Disease

## 2016-07-20 ENCOUNTER — Other Ambulatory Visit: Payer: Self-pay | Admitting: *Deleted

## 2016-07-20 MED ORDER — PRAVASTATIN SODIUM 20 MG PO TABS: 40.0000 mg | ORAL_TABLET | Freq: Every day | ORAL | 3 refills | Status: DC

## 2016-08-20 ENCOUNTER — Other Ambulatory Visit: Payer: Self-pay | Admitting: Cardiovascular Disease

## 2016-09-20 ENCOUNTER — Other Ambulatory Visit: Payer: Self-pay | Admitting: Family Medicine

## 2016-10-17 ENCOUNTER — Other Ambulatory Visit: Payer: Self-pay | Admitting: Family Medicine

## 2016-10-19 ENCOUNTER — Other Ambulatory Visit: Payer: Self-pay | Admitting: *Deleted

## 2016-10-19 MED ORDER — CITALOPRAM HYDROBROMIDE 40 MG PO TABS: 40.0000 mg | ORAL_TABLET | Freq: Every day | ORAL | 3 refills | Status: DC

## 2016-10-20 ENCOUNTER — Other Ambulatory Visit: Payer: Self-pay | Admitting: Cardiovascular Disease

## 2016-10-29 ENCOUNTER — Encounter: Payer: Self-pay | Admitting: Cardiovascular Disease

## 2016-10-29 ENCOUNTER — Ambulatory Visit (INDEPENDENT_AMBULATORY_CARE_PROVIDER_SITE_OTHER): Payer: Medicare HMO | Admitting: Cardiovascular Disease

## 2016-10-29 VITALS — BP 116/76 | HR 56 | Ht 66.0 in | Wt 194.0 lb

## 2016-10-29 DIAGNOSIS — I5032 Chronic diastolic (congestive) heart failure: Secondary | ICD-10-CM | POA: Diagnosis not present

## 2016-10-29 DIAGNOSIS — I25119 Atherosclerotic heart disease of native coronary artery with unspecified angina pectoris: Secondary | ICD-10-CM | POA: Diagnosis not present

## 2016-10-29 DIAGNOSIS — I10 Essential (primary) hypertension: Secondary | ICD-10-CM | POA: Diagnosis not present

## 2016-10-29 DIAGNOSIS — E7800 Pure hypercholesterolemia, unspecified: Secondary | ICD-10-CM | POA: Diagnosis not present

## 2016-10-29 DIAGNOSIS — I340 Nonrheumatic mitral (valve) insufficiency: Secondary | ICD-10-CM

## 2016-10-29 MED ORDER — METOPROLOL SUCCINATE ER 25 MG PO TB24: ORAL_TABLET | ORAL | 3 refills | Status: DC

## 2016-10-29 MED ORDER — FUROSEMIDE 20 MG PO TABS: 20.0000 mg | ORAL_TABLET | Freq: Every day | ORAL | 3 refills | Status: DC

## 2016-10-29 NOTE — Progress Notes (Signed)
Chief Complaint  Patient presents with  . Hypertension  . Hyperlipidemia  . Coronary Artery Disease  . Congestive Heart Failure     History of Present Illness: 60 yo male with a history of CAD s/p CABG 2004 with subsequent PCI, HLD, HTN, GERD and mitral regurgitation who is here today for cardiac follow up. Echo 01/31/12 with normal LV size and function with at least moderate MR. TEE on 03/27/12 to evaluate his MR and this showed mild to moderate MR. Cardiac cath 05/20/12 with severe disease in the SVG to the PDA and 2 DES were placed in the SVG to the PDA. The LIMA was patent but the vein grafts to the OM, Diagonal and PLA were occluded. He had recurrence of angina December 2014. Repeat cardiac cath 10/30/13 with patent LIMA to LAD, patent SVG to PDA with severe disease. Unchanged disease in native vessels as below. 2 drug eluting stents were placed in the SVG to the PDA. Echo August 2015 with normal LV function, mild MR. He was seen here in July 2016 with recurrent chest pain. Cardiac cath July 2016 with new occlusion of SVG to PDA. The only patent graft was the LIMA to LAD. No targets for PCI. Ranexa was started.   He is here today for follow up.  No chest pain. He does have constant fatigue. No change in baseline dyspnea with exertion. Recent URI. No LE edema.   Primary Care Physician: [facility] Mickie HillierIan McKeag, MD   Past Medical History:  Diagnosis Date  . Anxiety   . Arthritis    hands, shoulders, arms  . CAD (coronary artery disease)    5V CABG in 2004; stent to RCA x1 and SVG to acute marginal x2 2011;  PTCA/DES x 2 body of SVG to PDA  05/20/12  . CHF (congestive heart failure) (HCC)     EF 55% by echo 03/27/2012  . Chronic lower back pain    "w/activity"  . Depression   . GERD (gastroesophageal reflux disease)   . HLD (hyperlipidemia)   . Hypertension   . Low HDL (under 40)   . Mitral regurgitation    Moderate by TEE 03/27/12  . Myocardial infarction 2004    . Numbness and tingling in hands   . Numbness and tingling of both legs   . Umbilical hernia    unrepaired (05/20/12)    Past Surgical History:  Procedure Laterality Date  . CARDIAC CATHETERIZATION N/A 05/27/2015   Procedure: Left Heart Cath and Coronary Angiography;  Surgeon: Kathleene Hazelhristopher D Shaleah Nissley, MD;  [facility];  Service: Cardiovascular;  Laterality: N/A;  . CORONARY ANGIOPLASTY WITH STENT PLACEMENT  05/11   stent to RCA x 1 and SVG-acute marginal x2. EF normal  . CORONARY ANGIOPLASTY WITH STENT PLACEMENT  05/20/12   PTCA/DES x 2 body of SVG to PDA   . CORONARY ARTERY BYPASS GRAFT  2004   5v CABG   . INCISION AND DRAINAGE OF WOUND  2010   "from bite; maybe snake or spider; almost lost LLE"  . LEFT HEART CATHETERIZATION WITH CORONARY/GRAFT ANGIOGRAM N/A 05/20/2012   Procedure: LEFT HEART CATHETERIZATION WITH Isabel CapriceORONARY/GRAFT ANGIOGRAM;  Surgeon: Kathleene Hazelhristopher D Jumaane Weatherford, MD;  [facility];  Service: Cardiovascular;  Laterality: N/A;  . LEFT HEART CATHETERIZATION WITH CORONARY/GRAFT ANGIOGRAM N/A 10/30/2013   Procedure: LEFT HEART CATHETERIZATION WITH Isabel CapriceORONARY/GRAFT ANGIOGRAM;  Surgeon: Kathleene Hazelhristopher D Marisue Canion, MD;  [facility];  Service: Cardiovascular;  Laterality: N/A;  .  PERCUTANEOUS CORONARY STENT INTERVENTION (PCI-S)  10/30/2013   Procedure: PERCUTANEOUS CORONARY STENT INTERVENTION (PCI-S);  Surgeon: Kathleene Hazel, MD;  Location: [facility] CATH LAB;  Service: Cardiovascular;;  . PTCA  10/30/2013   DES  SVG    . TEE WITHOUT CARDIOVERSION  03/27/2012   Procedure: TRANSESOPHAGEAL ECHOCARDIOGRAM (TEE);  Surgeon: Laurey Morale, MD;  Location: [facility] ENDOSCOPY;  Service: Cardiovascular;  Laterality: N/A;  to be done at 1100    Current Outpatient Prescriptions  Medication Sig Dispense Refill  . aspirin EC 81 MG tablet Take 81 mg by mouth daily.    . citalopram (CELEXA) 40 MG tablet Take 1 tablet (40 mg total) by mouth daily. 90 tablet 3  . clopidogrel  (PLAVIX) 75 MG tablet Take 1 tablet (75 mg total) by mouth daily. 90 tablet 3  . fluticasone (FLONASE) 50 MCG/ACT nasal spray Place 2 sprays into both nostrils daily. (Patient taking differently: Place 2 sprays into both nostrils as needed. ) 16 g 6  . furosemide (LASIX) 20 MG tablet Take 1 tablet (20 mg total) by mouth daily. 90 tablet 3  . metoprolol succinate (TOPROL-XL) 25 MG 24 hr tablet TAKE ONE TABLET BY MOUTH DAILY WITH OR IMMEDIATELY FOLLOWING A MEAL. 90 tablet 3  . Multiple Vitamin (MULTIVITAMIN WITH MINERALS) TABS tablet Take 1 tablet by mouth daily.    . pantoprazole (PROTONIX) 40 MG tablet TAKE 1 TABLET BY MOUTH ONCE DAILY AT 6:00 AM. 90 tablet 1  . potassium chloride SA (K-DUR,KLOR-CON) 20 MEQ tablet TAKE 1 TABLET BY MOUTH ONCE A DAY. 90 tablet 2  . pravastatin (PRAVACHOL) 20 MG tablet Take 2 tablets (40 mg total) by mouth daily. 90 tablet 3  . ranolazine (RANEXA) 500 MG 12 hr tablet Take 1 tablet (500 mg total) by mouth 2 (two) times daily. 180 tablet 3  . tetrahydrozoline-zinc (VISINE-AC) 0.05-0.25 % ophthalmic solution Place 2 drops into both eyes 3 (three) times daily as needed (itching/burning).    Marland Kitchen NITROSTAT 0.4 MG SL tablet DISSOLVE 1 TABLET UNDER TONGUE EVERY 5 MINUTES UP TO 15 MIN FOR CHESTPAIN. IF NO RELIEF CALL 911. (Patient not taking: Reported on 10/29/2016) 25 tablet 6   No current facility-administered medications for this visit.     Allergies  Allergen Reactions  . Sulfa Antibiotics Rash  . Hydrocodone Hives and Itching    Social History   Social History  . Marital status: Married    Spouse name: N/A  . Number of children: 3  . Years of education: N/A   Occupational History  . 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 Smoking status: Former Smoker    Packs/day: 3.00    Years: 40.00    Types: Cigarettes    Quit date: 01/23/2003  . Smokeless tobacco: Never Used  . 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 Drug use: No     Comment: 05/20/12 "last marijuana was 2004"  . Sexual activity: Yes   Other Topics Concern  . Not on file   Social History Narrative  . No narrative on file    Family History  Problem Relation Age of Onset  . Heart disease Mother   . Cancer Mother     ? type  . Diabetes Father   . Hyperlipidemia Father   . Hypertension Father   . Stomach cancer Father   . Heart disease Brother 12  X 5 brothers    Review of Systems:  As stated in the HPI and otherwise negative.   BP 116/76   Pulse (!) 56   Ht 5\' 6"  (1.676 m)   Wt 194 lb (88 kg)   BMI 31.31 kg/m   Physical Examination: General: Well developed, well nourished, NAD  HEENT: OP clear, mucus membranes moist  SKIN: warm, dry. No rashes. Neuro: No focal deficits  Musculoskeletal: Muscle strength 5/5 all ext  Psychiatric: Mood and affect normal  Neck: No JVD, no carotid bruits, no thyromegaly, no lymphadenopathy.  Lungs:Clear bilaterally, no wheezes, rhonci, crackles Cardiovascular: Regular rate and rhythm. No murmurs, gallops or rubs. Abdomen:Soft. Bowel sounds present. Non-tender.  Extremities: No lower extremity edema. Pulses are 2 + in the bilateral DP/PT.   Cardiac cath July 2016: Left Anterior Descending  The vessel is large .   Marland Kitchen. Ost LAD to Prox LAD lesion, 100% stenosed. chronic total occlusion .      Ramus Intermedius  The vessel is moderate in size .   Marland Kitchen. Ramus lesion, 50% stenosed. diffuse .     Left Circumflex   . Second Obtuse Marginal Branch   The vessel is small in size.   Suezanne Jacquet. Ost 2nd Mrg lesion, 80% stenosed. discrete . Very small caliber vessel.   . Third Obtuse Marginal Branch   The vessel is small in size.     Right Coronary Artery   . Prox RCA lesion, 30% stenosed. diffuse .   Marland Kitchen. Dist RCA lesion, 90% stenosed. discrete .   Marland Kitchen. Right Posterior Descending Artery   The vessel is small in size.   . Right Posterior Atrioventricular Branch   The vessel  is small in size.     Graft Angiography    Free LIMA Graft to Mid LAD  LIMA was injected is normal in caliber, and is anatomically normal.     Free Graft to 1st Diag  SVG was injected . There is severe disease in the graft. Graft is chronically occluded.   . Origin to Prox Graft lesion, 100% stenosed. chronic total occlusion .     Free Graft to 2nd Mrg  SVG was injected . There is severe disease in the graft. The graft is chronically occluded.   . Origin lesion, 100% stenosed. chronic total occlusion .     Free Graft to RPDA  SVG was injected . There is severe disease in the graft. The graft is occluded.   . Origin lesion, 100% stenosed. chronic total occlusion .         Echo August 2015: Left ventricle: The cavity size was normal. Systolic function was normal. The estimated ejection fraction was in the range of 55% to 60%. Wall motion was normal; there were no regional wall motion abnormalities. Doppler parameters are consistent with abnormal left ventricular relaxation (grade 1 diastolic dysfunction). There was no evidence of elevated ventricular filling pressure by Doppler parameters. - Aortic valve: Trileaflet; normal thickness leaflets. There was no regurgitation. - Aortic root: The aortic root was normal in size. - Mitral valve: Thickened anterior leaflet of the mitral valve with mild prolapse. There was mild regurgitation directed posteriorly. - Left atrium: The atrium was mildly dilated. - Right ventricle: Systolic function was normal. - Right atrium: The atrium was normal in size. - Tricuspid valve: There was no regurgitation. - Pericardium, extracardiac: There was no pericardial effusion. Impressions: - Normal biventricular size and systolic function. Mild prolapse of the anterior leaflet of the mitral  valve with mild mitral regurgitation with posteriorly directed jet.  EKG:  EKG is ordered today. The ekg ordered today demonstrates  sinus brady, rate 56 bpm. 1st degree AV block. Inferior Q waves. Unchanged.   Recent Labs: 02/22/2016: ALT 28; BUN 16; Creat 1.09; Potassium 4.3; Sodium 138   Lipid Panel    Component Value Date/Time   CHOL 181 02/22/2016 0844   TRIG 198 (H) 02/22/2016 0844   HDL 47 02/22/2016 0844   CHOLHDL 3.9 02/22/2016 0844   VLDL 40 (H) 02/22/2016 0844   LDLCALC 94 02/22/2016 0844     Wt Readings from Last 3 Encounters:  10/29/16 194 lb (88 kg)  02/22/16 180 lb 1.9 oz (81.7 kg)  02/01/16 180 lb (81.6 kg)     Other studies Reviewed: Additional studies/ records that were reviewed today include: . Review of the above records demonstrates:    Assessment and Plan:   1. CAD with stable angina: He has no chest pain. Will continue ASA, Plavix, beta blocker, Ranexa, statin.  2. HTN: BP well controlled. No changes.   3. Hyperlipidemia:  Lipids well controlled. He takes Pravachol every day. Repeat lipids and LFTs in April 2017.  4. Mitral regurgitation: Mild by echo August 2015.    5. Chronic diastolic CHF: Weight is stable. No changes. He is taking Lasix every day.    Current medicines are reviewed at length with the patient today.  The patient does not have concerns regarding medicines.  The following changes have been made:  no change  Labs/ tests ordered today include:   Orders Placed This Encounter  Procedures  . Lipid Profile  . Hepatic function panel  . EKG 12-Lead    Disposition:   FU with me in 6 months  Signed, Verne Carrow, MD 10/29/2016 9:42 AM    [facility] [address] [place], [place], [place]  [address] Phone: [phone]; Fax: [phone]

## 2016-10-29 NOTE — Patient Instructions (Signed)
Medication Instructions:  Your physician recommends that you continue on your current medications as directed. Please refer to the Current Medication list given to you today.   Labwork: Your physician recommends that you return for lab work in: April. Scheduled for April 2,2018.  This will be fasting. The lab opens at 7:30   Testing/Procedures: none  Follow-Up: Your physician recommends that you schedule a follow-up appointment in: 6 months. Please call our office in about 3 months to schedule this appointment.     Any Other Special Instructions Will Be Listed Below (If Applicable).     If you need a refill on your cardiac medications before your next appointment, please call your pharmacy.

## 2017-01-28 ENCOUNTER — Ambulatory Visit: Payer: Medicare HMO | Admitting: Family Medicine

## 2017-02-18 ENCOUNTER — Other Ambulatory Visit: Payer: Medicare HMO

## 2017-02-20 ENCOUNTER — Other Ambulatory Visit: Payer: Self-pay | Admitting: *Deleted

## 2017-02-21 MED ORDER — PRAVASTATIN SODIUM 20 MG PO TABS: 40.0000 mg | ORAL_TABLET | Freq: Every day | ORAL | 3 refills | Status: DC

## 2017-02-26 ENCOUNTER — Emergency Department (HOSPITAL_COMMUNITY)
Admission: EM | Admit: 2017-02-26 | Discharge: 2017-02-26 | Disposition: A | Discharge status: Home / Self Care | Payer: Medicare Other | Attending: Emergency Medicine | Admitting: Emergency Medicine

## 2017-02-26 ENCOUNTER — Emergency Department (HOSPITAL_COMMUNITY): Payer: Medicare Other

## 2017-02-26 ENCOUNTER — Encounter (HOSPITAL_COMMUNITY): Payer: Self-pay | Admitting: Emergency Medicine

## 2017-02-26 DIAGNOSIS — I2510 Atherosclerotic heart disease of native coronary artery without angina pectoris: Secondary | ICD-10-CM | POA: Diagnosis not present

## 2017-02-26 DIAGNOSIS — Z7982 Long term (current) use of aspirin: Secondary | ICD-10-CM | POA: Insufficient documentation

## 2017-02-26 DIAGNOSIS — Z79899 Other long term (current) drug therapy: Secondary | ICD-10-CM | POA: Insufficient documentation

## 2017-02-26 DIAGNOSIS — I252 Old myocardial infarction: Secondary | ICD-10-CM | POA: Diagnosis not present

## 2017-02-26 DIAGNOSIS — I509 Heart failure, unspecified: Secondary | ICD-10-CM | POA: Diagnosis not present

## 2017-02-26 DIAGNOSIS — Z87891 Personal history of nicotine dependence: Secondary | ICD-10-CM | POA: Insufficient documentation

## 2017-02-26 DIAGNOSIS — Y929 Unspecified place or not applicable: Secondary | ICD-10-CM | POA: Diagnosis not present

## 2017-02-26 DIAGNOSIS — S62111A Displaced fracture of triquetrum [cuneiform] bone, right wrist, initial encounter for closed fracture: Secondary | ICD-10-CM | POA: Diagnosis not present

## 2017-02-26 DIAGNOSIS — S0990XA Unspecified injury of head, initial encounter: Secondary | ICD-10-CM | POA: Diagnosis not present

## 2017-02-26 DIAGNOSIS — W109XXA Fall (on) (from) unspecified stairs and steps, initial encounter: Secondary | ICD-10-CM | POA: Diagnosis not present

## 2017-02-26 DIAGNOSIS — Y999 Unspecified external cause status: Secondary | ICD-10-CM | POA: Insufficient documentation

## 2017-02-26 DIAGNOSIS — S0285XA Fracture of orbit, unspecified, initial encounter for closed fracture: Secondary | ICD-10-CM

## 2017-02-26 DIAGNOSIS — S0993XA Unspecified injury of face, initial encounter: Secondary | ICD-10-CM | POA: Diagnosis not present

## 2017-02-26 DIAGNOSIS — Y9301 Activity, walking, marching and hiking: Secondary | ICD-10-CM | POA: Insufficient documentation

## 2017-02-26 DIAGNOSIS — S0281XA Fracture of other specified skull and facial bones, right side, initial encounter for closed fracture: Secondary | ICD-10-CM | POA: Diagnosis not present

## 2017-02-26 DIAGNOSIS — S62114A Nondisplaced fracture of triquetrum [cuneiform] bone, right wrist, initial encounter for closed fracture: Secondary | ICD-10-CM

## 2017-02-26 DIAGNOSIS — R0781 Pleurodynia: Secondary | ICD-10-CM | POA: Diagnosis not present

## 2017-02-26 DIAGNOSIS — R51 Headache: Secondary | ICD-10-CM | POA: Diagnosis not present

## 2017-02-26 DIAGNOSIS — W19XXXA Unspecified fall, initial encounter: Secondary | ICD-10-CM

## 2017-02-26 DIAGNOSIS — I110 Hypertensive heart disease with heart failure: Secondary | ICD-10-CM | POA: Diagnosis not present

## 2017-02-26 DIAGNOSIS — S299XXA Unspecified injury of thorax, initial encounter: Secondary | ICD-10-CM | POA: Diagnosis not present

## 2017-02-26 IMAGING — CT CT MAXILLOFACIAL W/O CM
4 of 6 series · 17 of 47 positions shown, 19 images · non-contrast
Comparison: None.

CLINICAL DATA: Tripped, fell down steps yesterday. Right facial
pain.

EXAM:
CT HEAD WITHOUT CONTRAST
CT MAXILLOFACIAL WITHOUT CONTRAST
TECHNIQUE: Multidetector CT imaging of the head and maxillofacial structures
were performed using the standard protocol without intravenous
contrast. Multiplanar CT image reconstructions of the maxillofacial
structures were also generated.

[Series 2: head wo · axial · 0.43mm/px · z∈[+8,+113]mm · 6 of 31 slices shown, 8 images]
[im 5/31  brain]
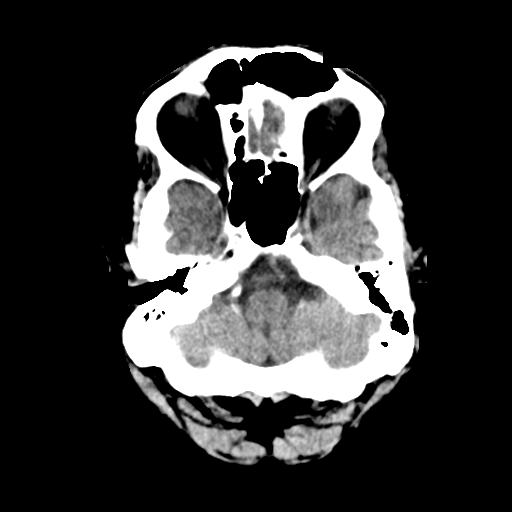
[im 5/31  bone]
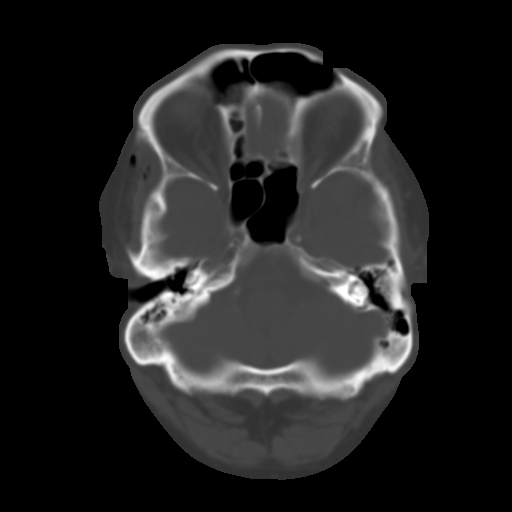
[im 9/31  bone]
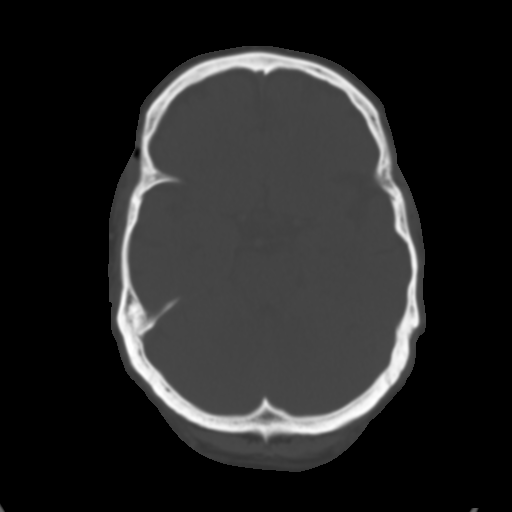
[im 13/31  bone]
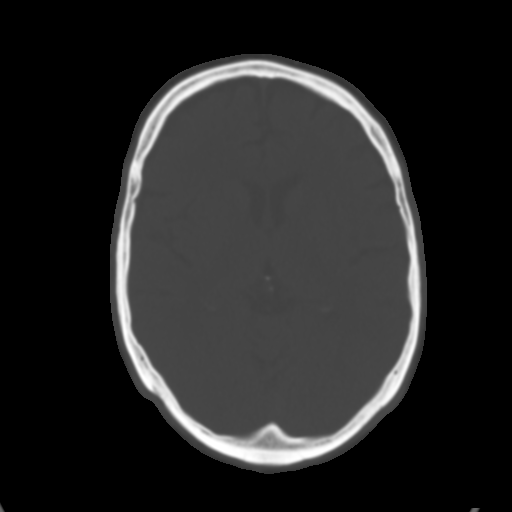
[im 18/31  bone]
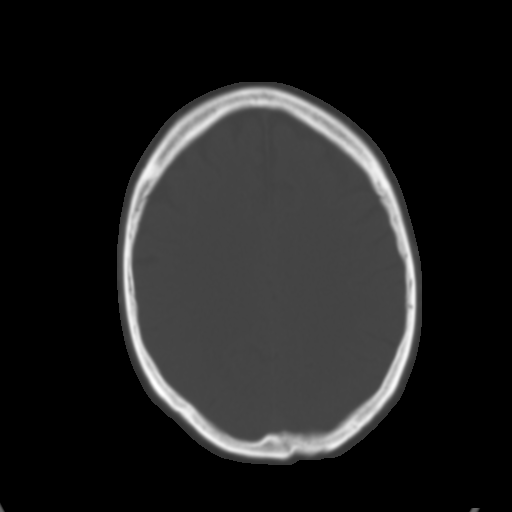
[im 22/31  brain]
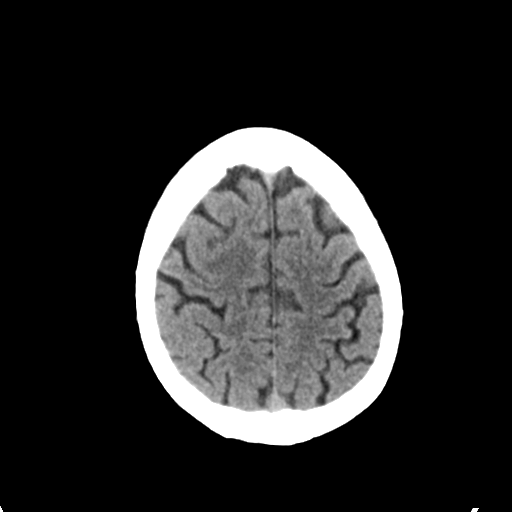
[im 22/31  bone]
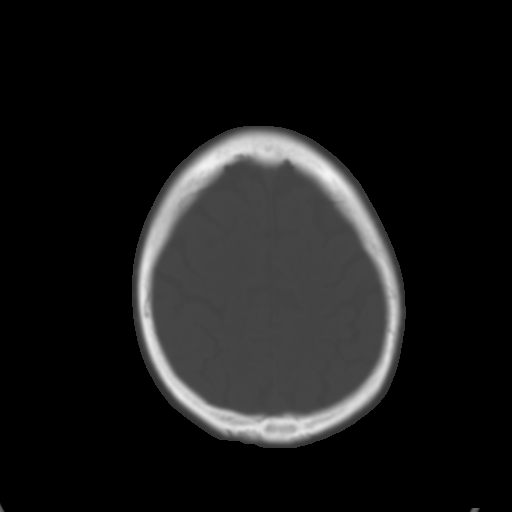
[im 26/31  bone]
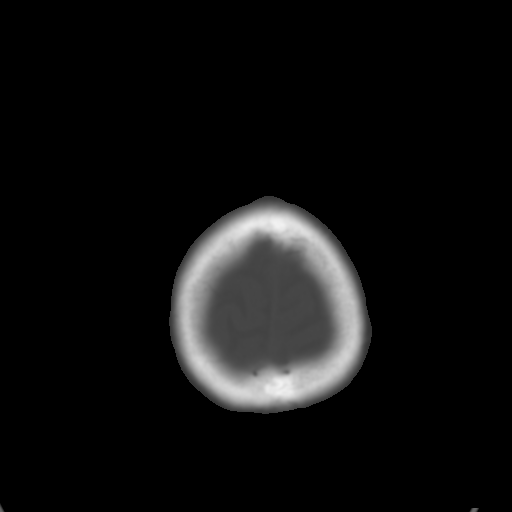

[Series 4: coronal soft tissue · coronal · 0.30mm/px · 3 of 66 slices shown]
[im 17/66  bone]
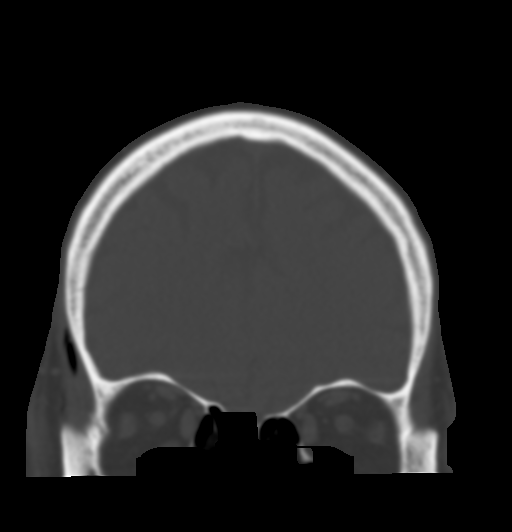
[im 33/66  bone]
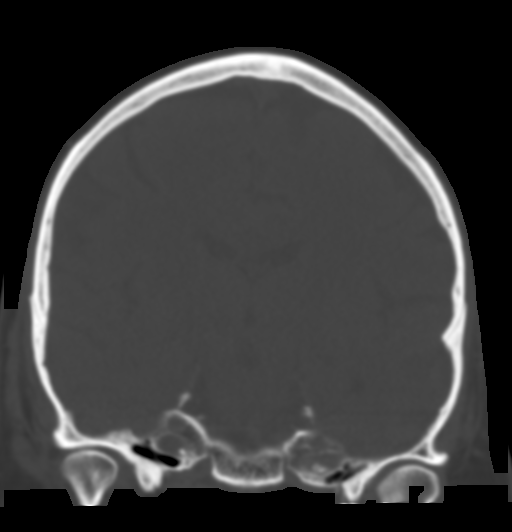
[im 49/66  bone]
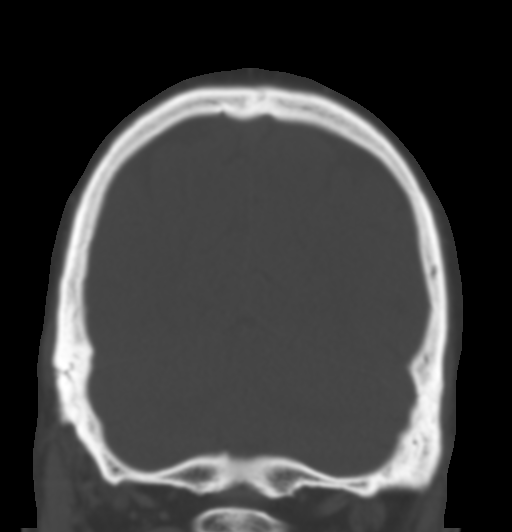

[Series 6: max soft · axial · 0.36mm/px · z∈[-110,-14]mm · 6 of 85 slices shown]
[im 9/85  brain]
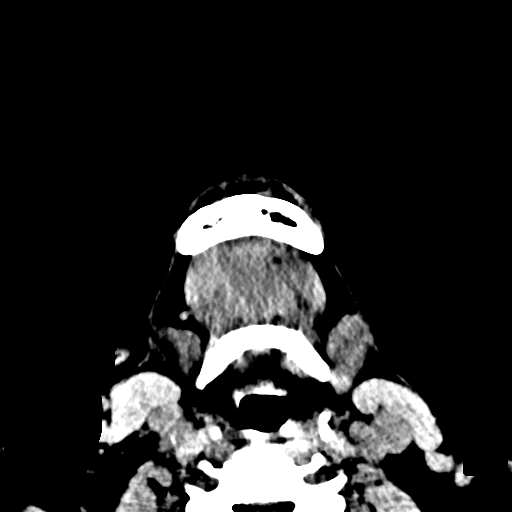
[im 17/85  brain]
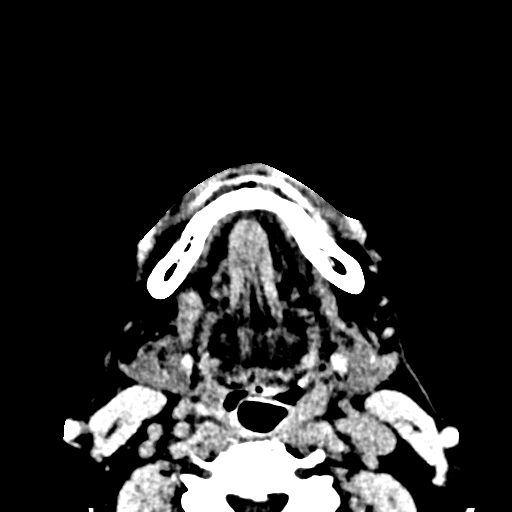
[im 29/85  brain]
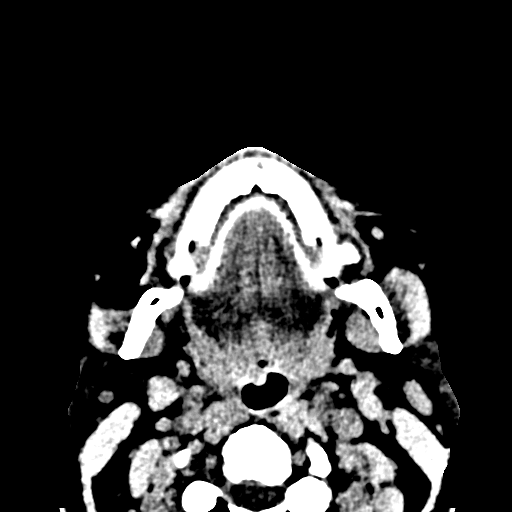
[im 37/85  brain]
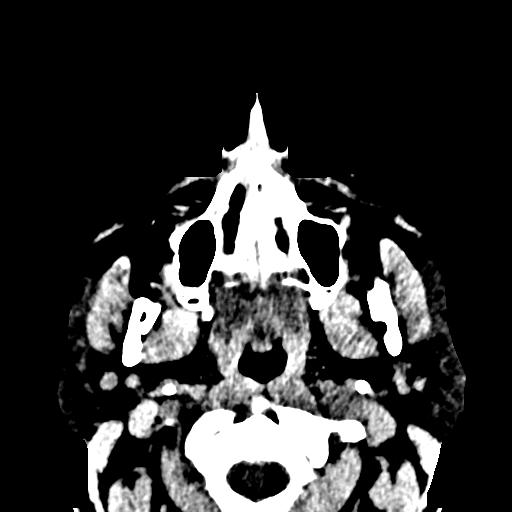
[im 49/85  brain]
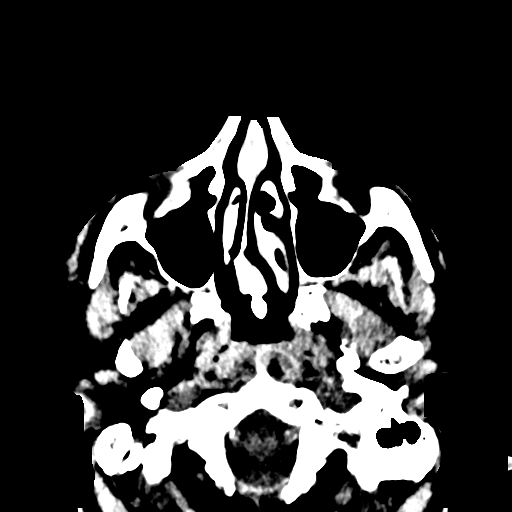
[im 57/85  brain]
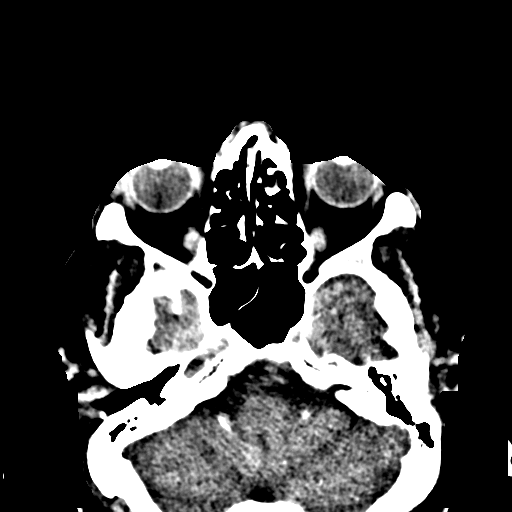

[Series 11: sagittal soft · sagittal · 0.34mm/px · 2 of 82 slices shown]
[im 28/82  bone]
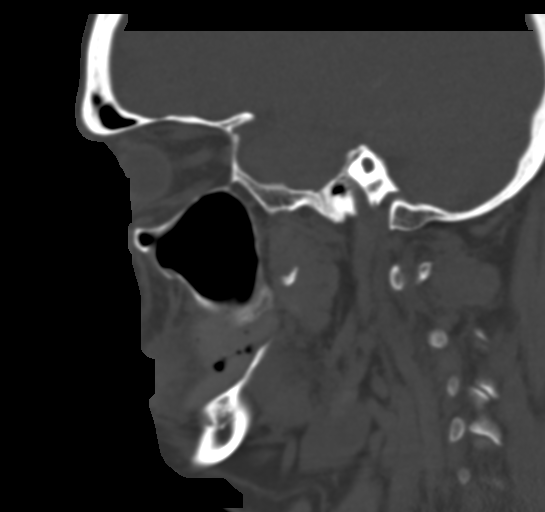
[im 55/82  bone]
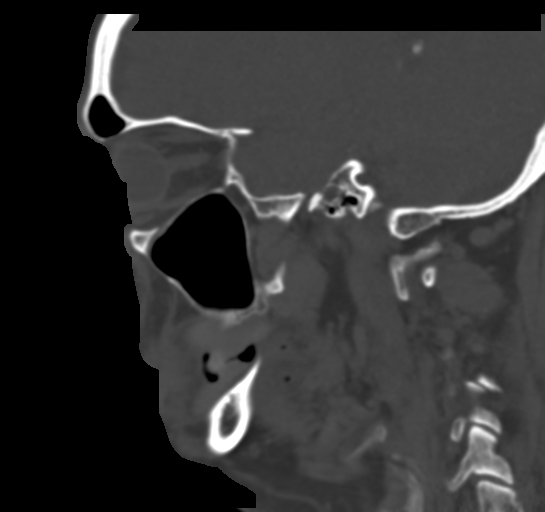

[17 of 47 positions shown; findings below may reference images not displayed]

FINDINGS: CT HEAD FINDINGS

Brain: No acute intracranial abnormality. Specifically, no
hemorrhage, hydrocephalus, mass lesion, acute infarction, or
significant intracranial injury.

Vascular: No hyperdense vessel or unexpected calcification.

Skull: No acute calvarial abnormality. Concern for right lateral
orbital wall fracture.

Other: Locules of gas noted in the right temporal fossa.

CT MAXILLOFACIAL FINDINGS

Fracture noted through the right lateral orbital wall. No visible
fracture through the right maxillary sinus, but there is a small
air-fluid level in the right maxillary sinus with gas noted in the
right face soft tissues extending from overlying the right lateral
mandible superiorly into the right temporal fossa. No visible
mandibular fracture. Temporomandibular joints appear located.
IMPRESSION: No acute intracranial abnormality.

Right lateral orbital wall fracture. While there is no definite
visible right maxillary sinus fracture, concern is for occult
fracture through the maxillary sinus wall with right facial soft
tissue gas and small amount of layering fluid in the right maxillary
sinus.

## 2017-02-26 IMAGING — DX DG HAND COMPLETE 3+V*R*
3 series · 3 of 3 positions shown · non-contrast
Comparison: None.

CLINICAL DATA: Fell down steps last night right hand swelling and
pain

EXAM:
RIGHT HAND - COMPLETE 3+ VIEW

[hand pa]
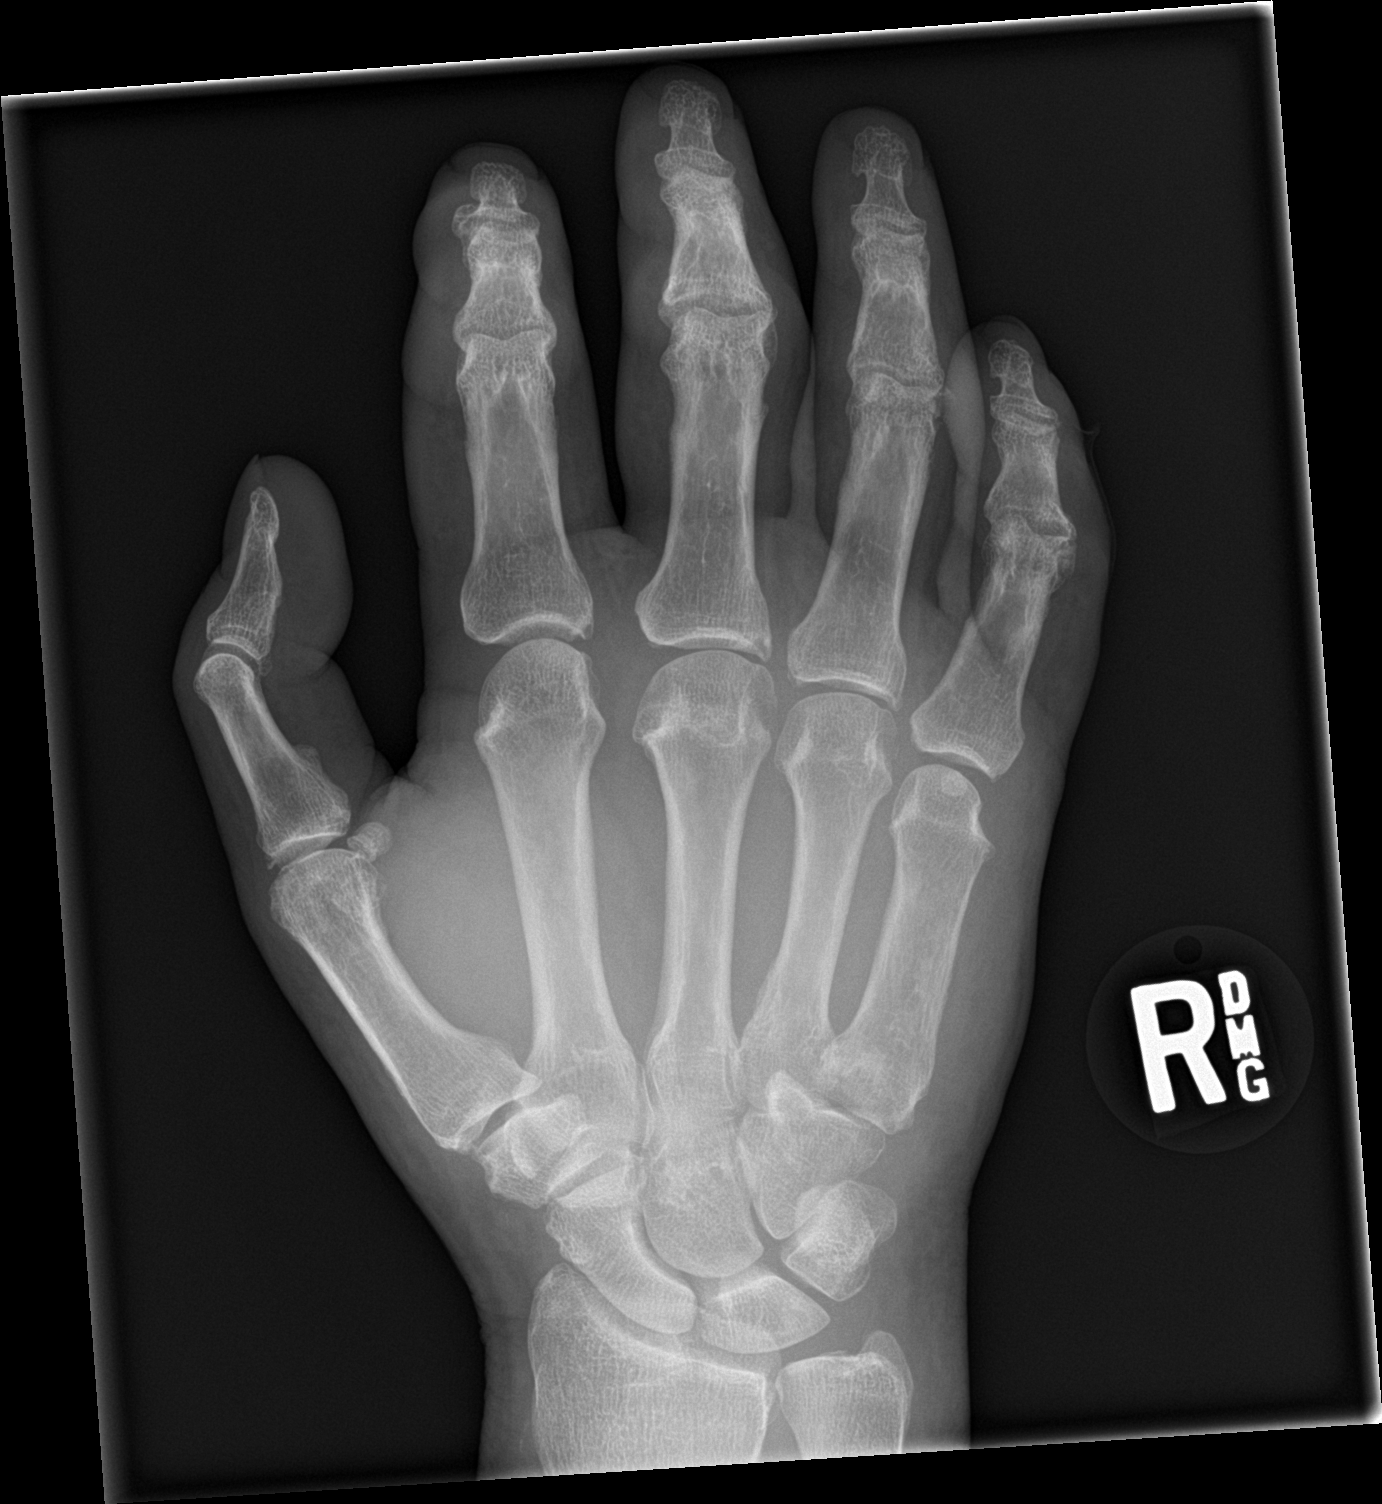

[hand obl]
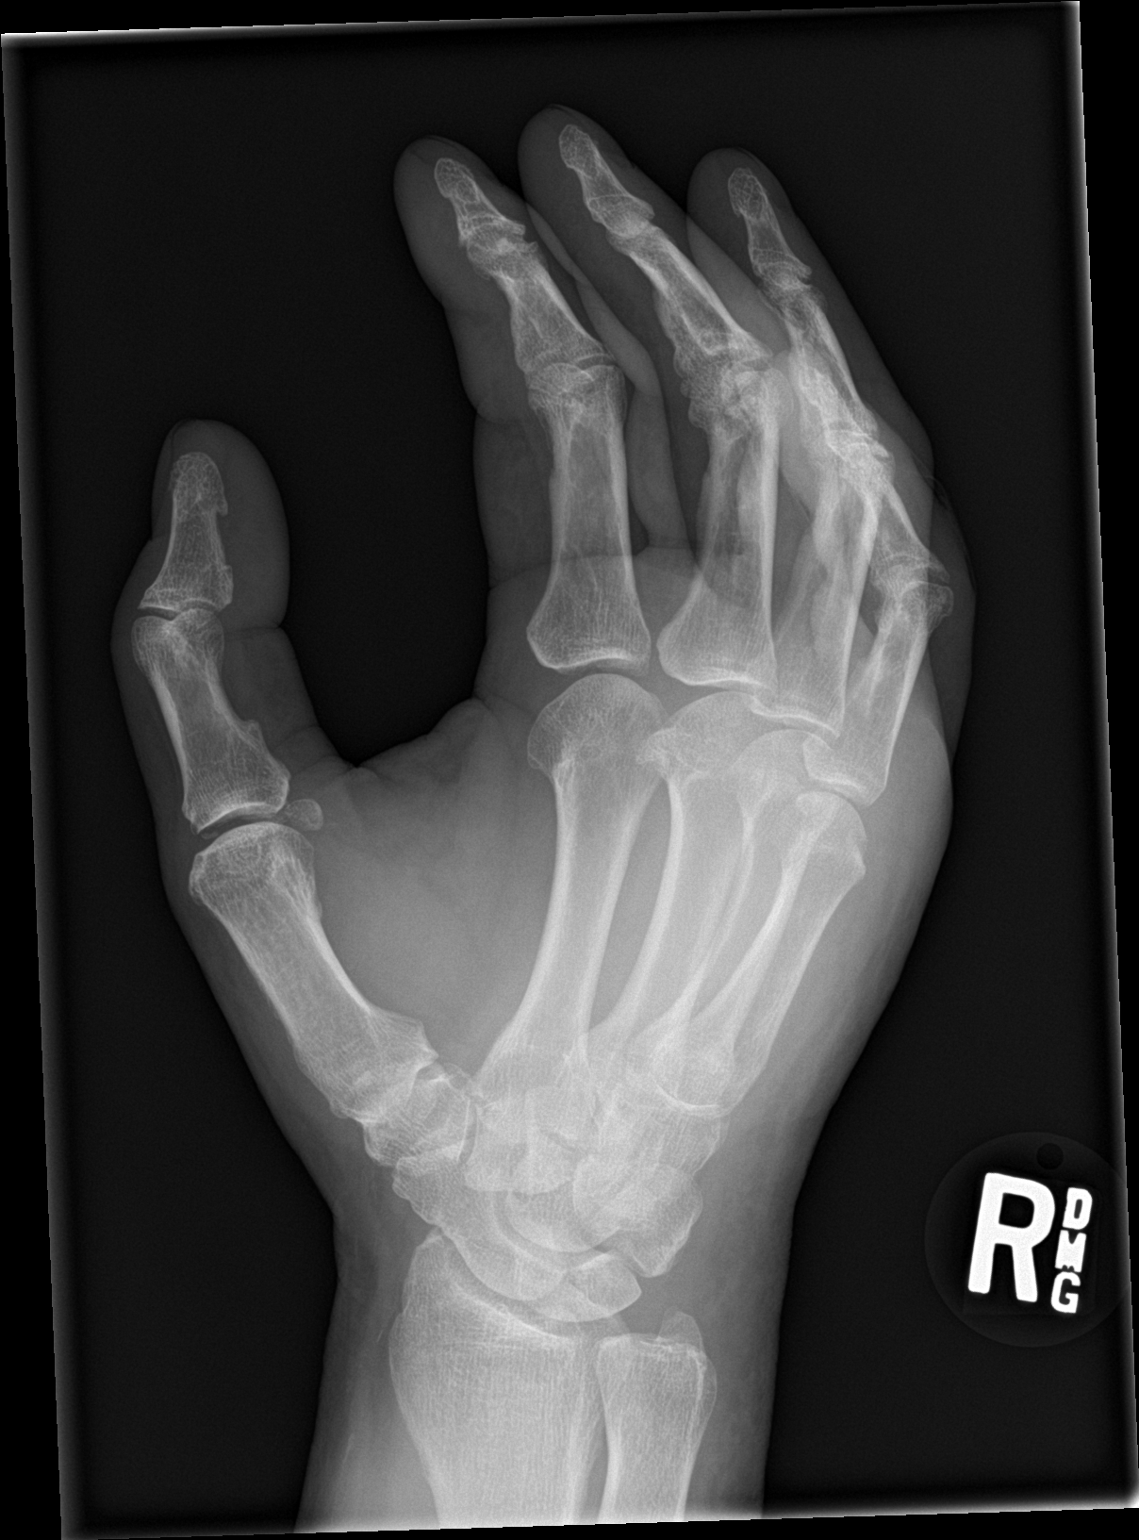

[hand lat]
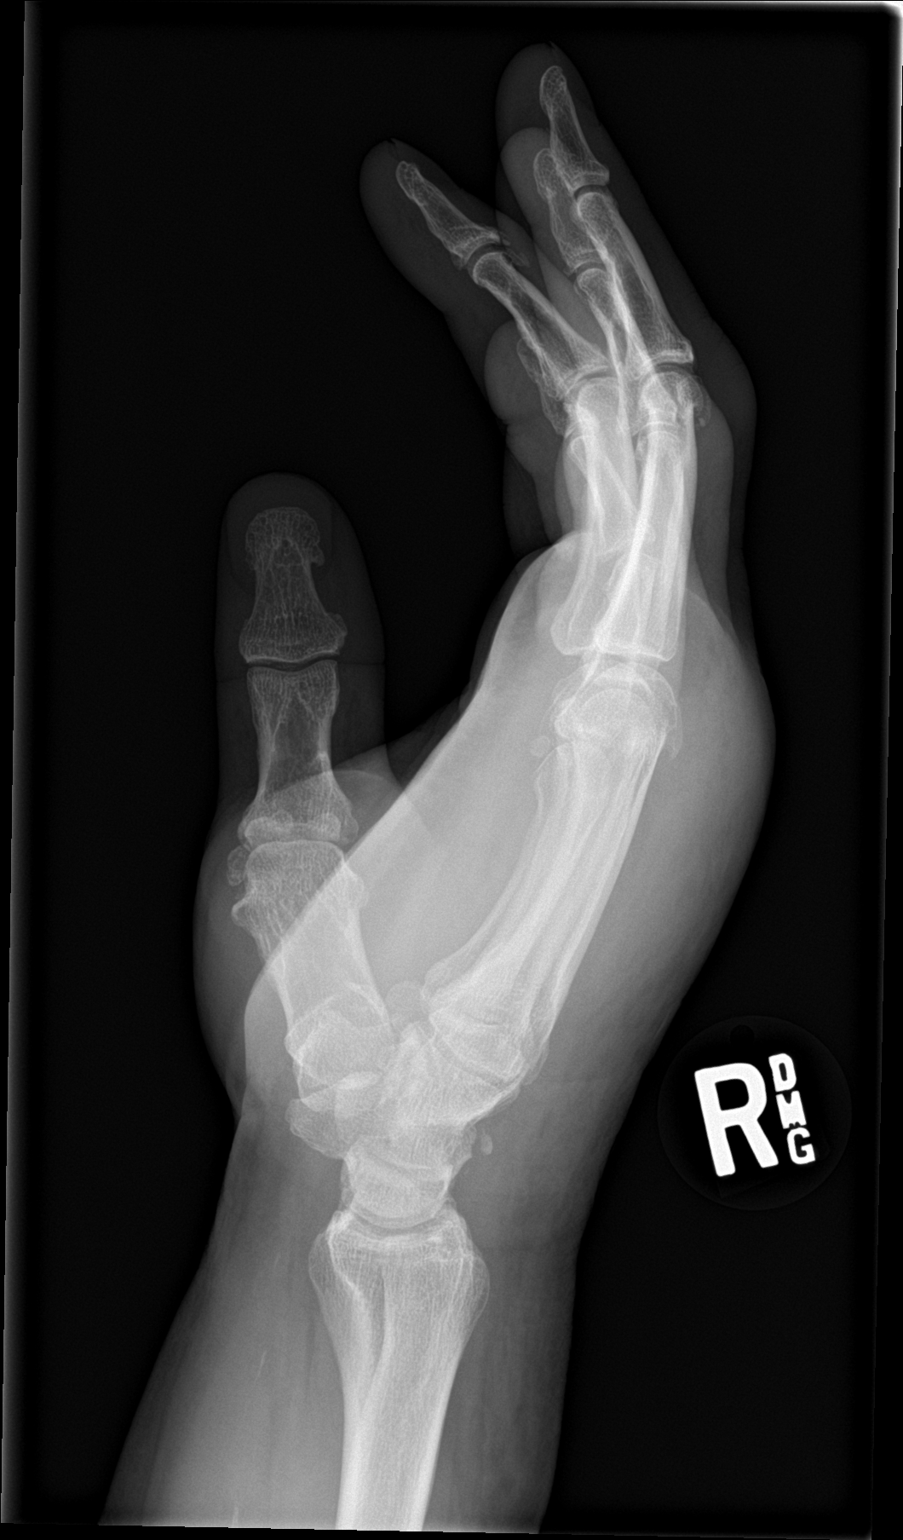

[3 of 3 positions shown; findings below may reference images not displayed]

FINDINGS: The right radiocarpal joint space appears normal and the ulnar
styloid is intact. The carpal bones are in normal position. However,
on the lateral view there is a bony density along the dorsal aspect
of the carpal bones most consistent with triquetral fracture with
adjacent soft tissue swelling. There are degenerative changes
involving the heads of the third and fourth metacarpals with
degenerative change involving the DIP and PIP joints. No erosion is
seen.
IMPRESSION: 1. Triquetral fracture with adjacent soft tissue swelling.
2. Diffuse changes of osteoarthritis.

## 2017-02-26 IMAGING — DX DG CHEST 2V
2 series · 2 of 2 positions shown · non-contrast
Comparison: Chest x-ray of [DATE]

CLINICAL DATA: Fell down steps yesterday with right-sided rib pain

EXAM:
CHEST  2 VIEW

[chest pa]
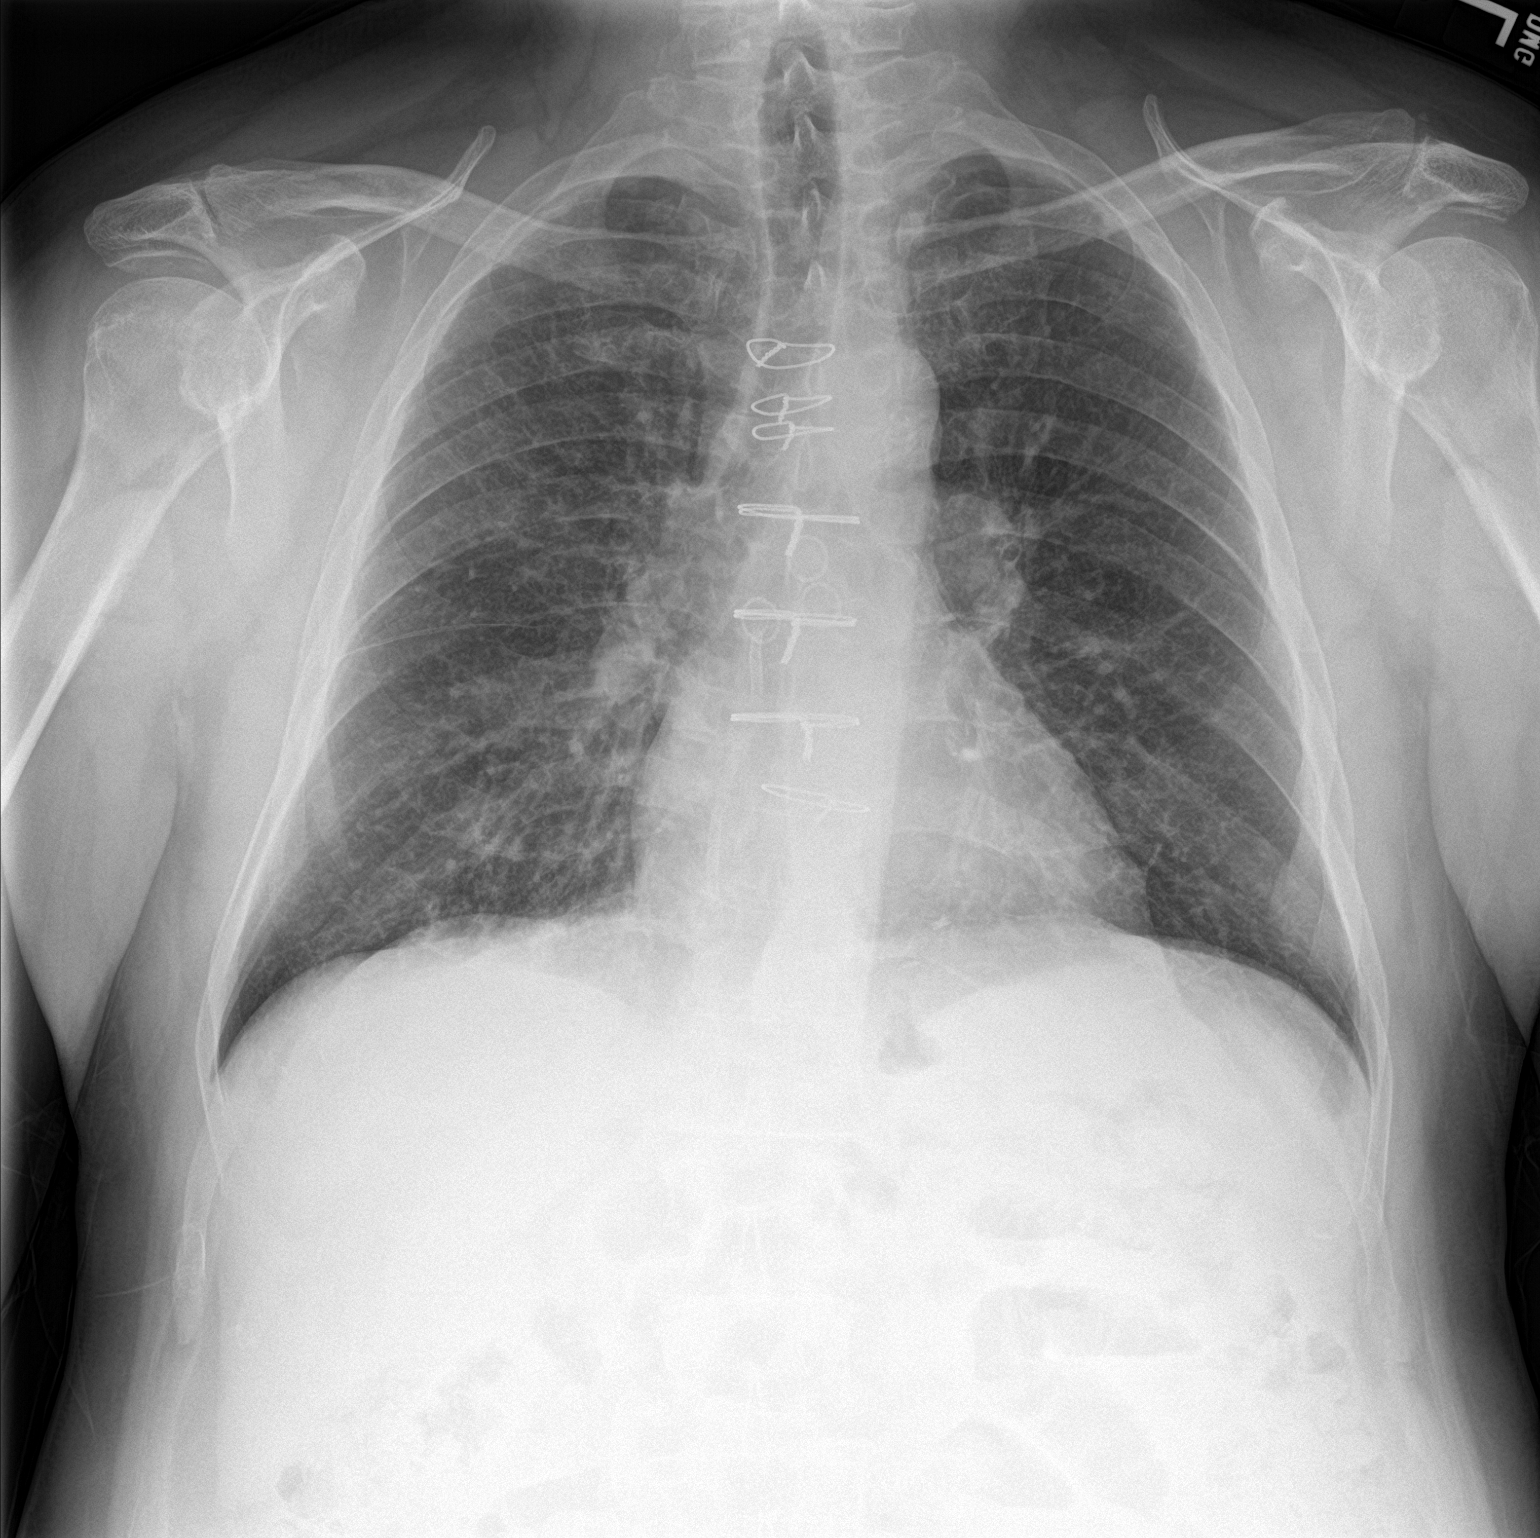

[chest lat]
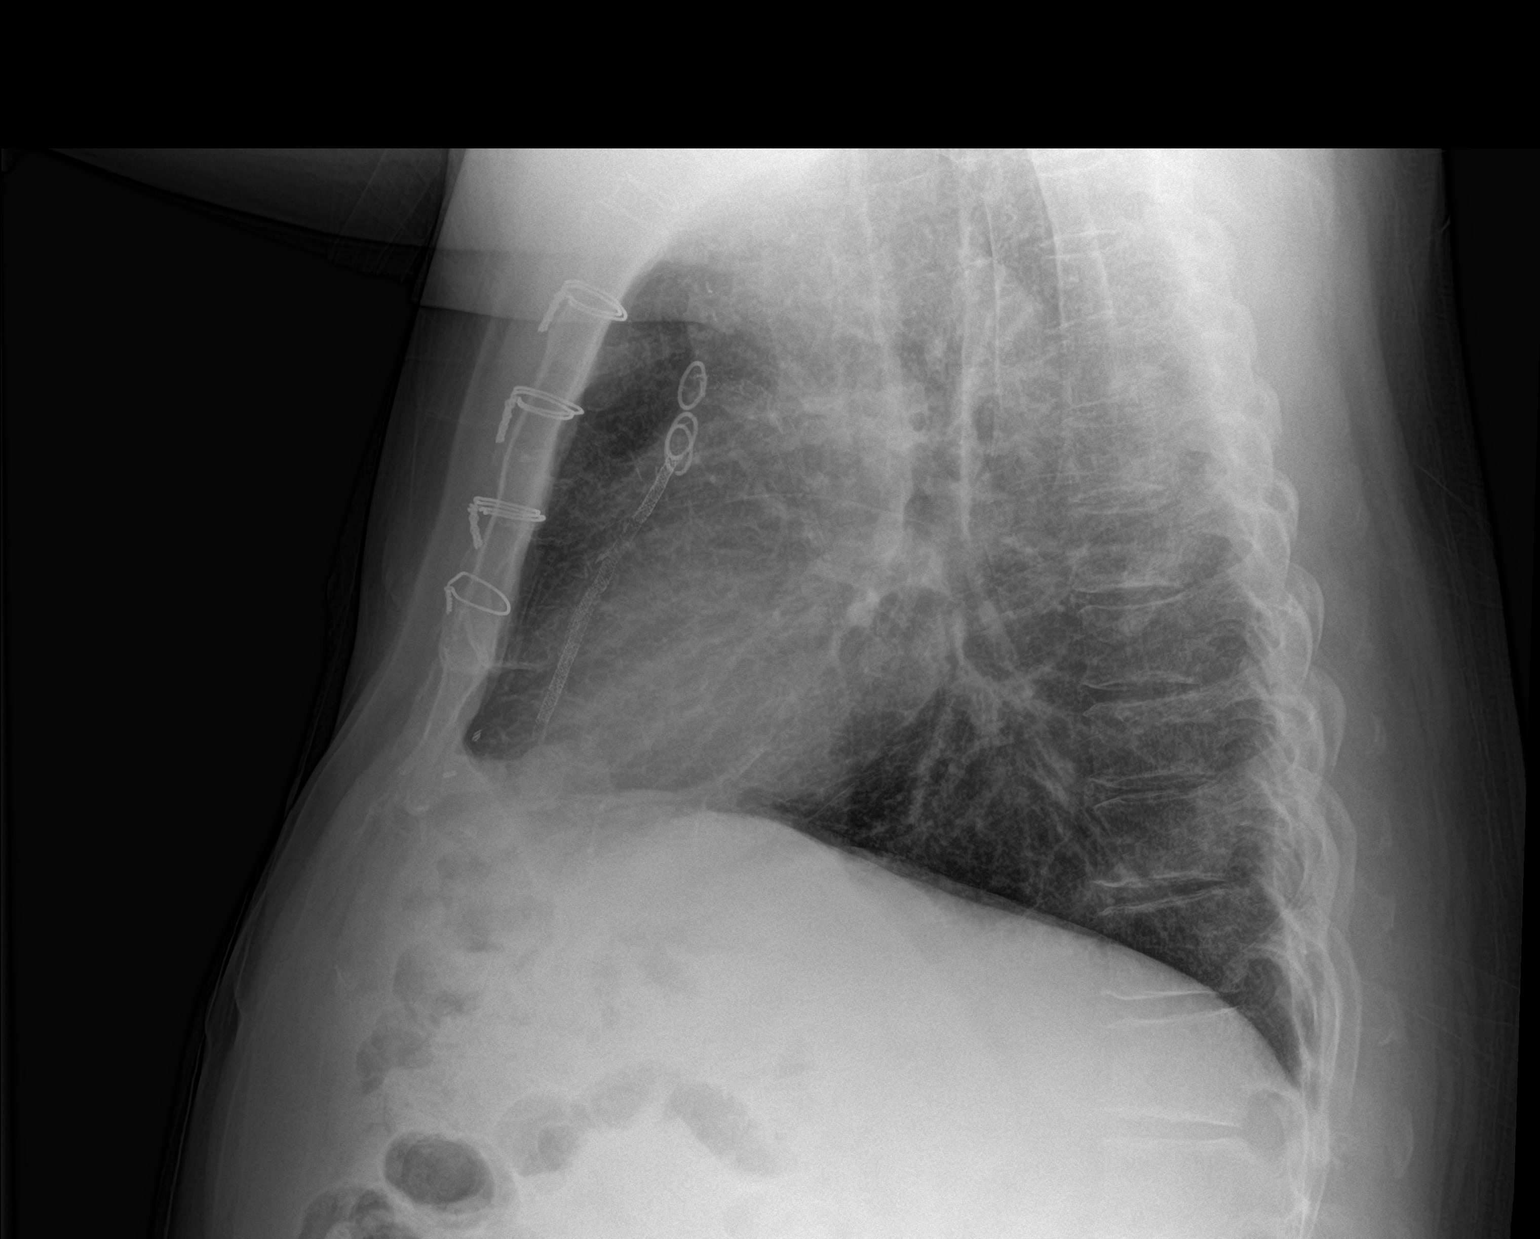

[2 of 2 positions shown; findings below may reference images not displayed]

FINDINGS: Prominent interstitial markings remain throughout the lungs most
consistent with fibrotic change. No definite active infiltrate or
effusion is seen. No pneumothorax is noted. Mediastinal hilar
contours are unremarkable. The heart is within normal limits in
size. Median sternotomy sutures are noted from prior CABG. No
fracture is evident.
IMPRESSION: Prominent interstitial markings appear chronic most consistent with
fibrotic change. No definite active process is seen

## 2017-02-26 MED ORDER — DIPHENHYDRAMINE HCL 25 MG PO CAPS
25.0000 mg | ORAL_CAPSULE | Freq: Once | ORAL | Status: AC
  Administered 2017-02-26: 25 mg via ORAL
  Filled 2017-02-26: qty 1

## 2017-02-26 MED ORDER — HYDROCODONE-ACETAMINOPHEN 5-325 MG PO TABS
1.0000 | ORAL_TABLET | Freq: Once | ORAL | Status: AC
  Administered 2017-02-26: 1 via ORAL
  Filled 2017-02-26: qty 1

## 2017-02-26 MED ORDER — HYDROCODONE-ACETAMINOPHEN 5-325 MG PO TABS: 1.0000 | ORAL_TABLET | ORAL | 0 refills | Status: DC | PRN

## 2017-02-26 MED ORDER — DIPHENHYDRAMINE HCL 25 MG PO TABS: 25.0000 mg | ORAL_TABLET | Freq: Four times a day (QID) | ORAL | 0 refills | Status: DC | PRN

## 2017-02-26 NOTE — Discharge Instructions (Signed)
Rest  Ice - ice swollen areas for 20 minutes at a time, several times a day. Switch to heat after a couple of days Compression - wear splint until your follow up with Dr. Abbe Amsterdam - elevate hand above level of heart to reduce swelling Pain medicine - take with food. Take Benadryl as needed for rash/itching

## 2017-02-26 NOTE — ED Notes (Signed)
Spoke with Dr. Criss Alvine about pt and vo received to order ct head, vo read back and verified.

## 2017-02-26 NOTE — ED Triage Notes (Signed)
Tripped and fell down 3 steps. Pt on plavix. Pt c/o r hand pain, r rib pain, and r side of face hurting. Bruising noted to mid abd with abrasion, also to right side of face and eye. r hand severely swollen with radial pulse swelling. r palm is also bruised. A/o. Denies LOC.

## 2017-02-26 NOTE — ED Provider Notes (Signed)
[facility]-EMER[facility]ENCY DEPT Provider Note   CSN: [id number] Arrival date & time: 02/26/17  0750     History   Chief Complaint Chief Complaint  Patient presents with  . Fall    HPI Kohler R Dacus is a 61 y.o. male who presents with pain after a mechanical fall. PMH significant for CAD, CHF. He was going out side to walk the dog yesterday evening and was walking down the stairs and tripped and fell on to the ground. He is on Plavix. He refused to be checked out yesterday but today was urged by his wife since he developed a significant amount of swelling. He currently has pain on the right side of his face, right hand and wrist, and right side of his chest. He has a wound on his forehead, hand, and bruising on his abdomen. He denies syncope, chest pain, SOB, abdominal pain, neck pain or back pain. He has been able to ambulate without difficulty.   HPI  Past Medical History:  Diagnosis Date  . Anxiety   . Arthritis    hands, shoulders, arms  . CAD (coronary artery disease)    5V CABG in 2004; stent to RCA x1 and SVG to acute marginal x2 2011;  PTCA/DES x 2 body of SVG to PDA  05/20/12  . CHF (congestive heart failure) (HCC)     EF 55% by echo 03/27/2012  . Chronic lower back pain    "w/activity"  . Depression   . GERD (gastroesophageal reflux disease)   . HLD (hyperlipidemia)   . Hypertension   . Low HDL (under 40)   . Mitral regurgitation    Moderate by TEE 03/27/12  . Myocardial infarction 2004  . Numbness and tingling in hands   . Numbness and tingling of both legs   . Umbilical hernia    unrepaired (05/20/12)    Patient Active Problem List   Diagnosis Date Noted  . Postnasal drip 12/27/2015  . Upper airway cough syndrome 10/04/2015  . Solitary pulmonary nodule 10/04/2015  . Elevated LFTs 08/07/2015  . AKI (acute kidney injury) (HCC) 08/07/2015  . Coronary artery disease involving native coronary artery of native heart with unstable angina pectoris (HCC)   . Pain of right great  toe 10/27/2014  . Tinea unguium 07/08/2014  . Eustachian tube disorder 07/08/2014  . Cough 07/08/2014  . Hives 12/31/2013  . Rib pain on right side 12/11/2013  . Pulmonary nodules/lesions, multiple 12/11/2013  . Dyslipidemia 10/27/2013  . Chronic anticoagulation 09/28/2013  . Actinic keratosis 10/31/2012  . Snoring disorder 10/31/2012  . Umbilical hernia 03/28/2012  . Dyspnea 03/20/2012  . Depression 01/02/2012  . Muscle spasm of both lower legs 01/02/2012  . Mitral regurgitation 06/14/2010  . CAD 04/07/2010  . Low HDL (under 40) 11/07/2009  . Essential hypertension, benign 11/07/2009  . Congestive heart failure 11/07/2009  . GERD 11/07/2009    Past Surgical History:  Procedure Laterality Date  . CARDIAC CATHETERIZATION N/A 05/27/2015   Procedure: Left Heart Cath and Coronary Angiography;  Surgeon: Kathleene Hazel, MD;  Location: [facility] INVASIVE CV LAB;  Service: Cardiovascular;  Laterality: N/A;  . CORONARY ANGIOPLASTY WITH STENT PLACEMENT  05/11   stent to RCA x 1 and SVG-acute marginal x2. EF normal  . CORONARY ANGIOPLASTY WITH STENT PLACEMENT  05/20/12   PTCA/DES x 2 body of SVG to PDA   . CORONARY ARTERY BYPASS GRAFT  2004   5v CABG   . INCISION AND DRAINAGE OF WOUND  2010   "  from bite; maybe snake or spider; almost lost LLE"  . LEFT HEART CATHETERIZATION WITH CORONARY/GRAFT ANGIOGRAM N/A 05/20/2012   Procedure: LEFT HEART CATHETERIZATION WITH Isabel Caprice;  Surgeon: Kathleene Hazel, MD;  Location: [facility] CATH LAB;  Service: Cardiovascular;  Laterality: N/A;  . LEFT HEART CATHETERIZATION WITH CORONARY/GRAFT ANGIOGRAM N/A 10/30/2013   Procedure: LEFT HEART CATHETERIZATION WITH Isabel Caprice;  Surgeon: Kathleene Hazel, MD;  Location: [facility] CATH LAB;  Service: Cardiovascular;  Laterality: N/A;  . PERCUTANEOUS CORONARY STENT INTERVENTION (PCI-S)  10/30/2013   Procedure: PERCUTANEOUS CORONARY STENT INTERVENTION (PCI-S);  Surgeon: Kathleene Hazel, MD;  Location: [facility] CATH LAB;  Service: Cardiovascular;;  . PTCA  10/30/2013   DES  SVG    . TEE WITHOUT CARDIOVERSION  03/27/2012   Procedure: TRANSESOPHAGEAL ECHOCARDIOGRAM (TEE);  Surgeon: Laurey Morale, MD;  Location: [facility] ENDOSCOPY;  Service: Cardiovascular;  Laterality: N/A;  to be done at 1100       Home Medications    Prior to Admission medications   Medication Sig Start Date End Date Taking? Authorizing Provider  aspirin EC 81 MG tablet Take 81 mg by mouth daily.    Historical Provider, MD  citalopram (CELEXA) 40 MG tablet Take 1 tablet (40 mg total) by mouth daily. 10/19/16   Kathee Delton, MD  clopidogrel (PLAVIX) 75 MG tablet Take 1 tablet (75 mg total) by mouth daily. 05/21/16   Kathee Delton, MD  fluticasone (FLONASE) 50 MCG/ACT nasal spray Place 2 sprays into both nostrils daily. Patient taking differently: Place 2 sprays into both nostrils as needed.  12/26/15   Kathee Delton, MD  furosemide (LASIX) 20 MG tablet Take 1 tablet (20 mg total) by mouth daily. 10/29/16   Kathleene Hazel, MD  metoprolol succinate (TOPROL-XL) 25 MG 24 hr tablet TAKE ONE TABLET BY MOUTH DAILY WITH OR IMMEDIATELY FOLLOWING A MEAL. 10/29/16   Kathleene Hazel, MD  Multiple Vitamin (MULTIVITAMIN WITH MINERALS) TABS tablet Take 1 tablet by mouth daily.    Historical Provider, MD  NITROSTAT 0.4 MG SL tablet DISSOLVE 1 TABLET UNDER TONGUE EVERY 5 MINUTES UP TO 15 MIN FOR CHESTPAIN. IF NO RELIEF CALL 911. Patient not taking: Reported on 10/29/2016 06/06/15   Kathleene Hazel, MD  pantoprazole (PROTONIX) 40 MG tablet TAKE 1 TABLET BY MOUTH ONCE DAILY AT 6:00 AM. 10/22/16   Kathleene Hazel, MD  potassium chloride SA (K-DUR,KLOR-CON) 20 MEQ tablet TAKE 1 TABLET BY MOUTH ONCE A DAY. 07/11/16   Kathleene Hazel, MD  pravastatin (PRAVACHOL) 20 MG tablet Take 2 tablets (40 mg total) by mouth daily. 02/21/17   Kathee Delton, MD  ranolazine (RANEXA) 500 MG 12 hr tablet Take 1 tablet (500 mg  total) by mouth 2 (two) times daily. 06/21/16   Kathleene Hazel, MD  tetrahydrozoline-zinc (VISINE-AC) 0.05-0.25 % ophthalmic solution Place 2 drops into both eyes 3 (three) times daily as needed (itching/burning).    Historical Provider, MD    Family History Family History  Problem Relation Age of Onset  . Heart disease Mother   . Cancer Mother     ? type  . Diabetes Father   . Hyperlipidemia Father   . Hypertension Father   . Stomach cancer Father   . Heart disease Brother 42    X 5 brothers    Social History Social History  Substance Use Topics  . Smoking status: Former Smoker    Packs/day: 3.00    Years: 40.00  Types: Cigarettes    Quit date: 01/23/2003  . Smokeless tobacco: Never Used  . 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   Respiratory: Negative for shortness of breath.   Cardiovascular: Negative for chest pain.  Gastrointestinal: Negative for abdominal pain.  Musculoskeletal: Positive for arthralgias and joint swelling. Negative for back pain, gait problem and neck pain.  Skin: Positive for wound.  Neurological: Negative for syncope, weakness, numbness and headaches.  All other systems reviewed and are negative.    Physical Exam Updated Vital Signs BP (!) 141/78 (BP Location: Left Arm)   Pulse 66   Temp 97 F (36.1 C) (Oral)   Resp 17   Ht  (1.676 m)   Wt 83.9 kg   SpO2 98%   BMI 29.86 kg/m   Physical Exam  Constitutional: He is oriented to person, place, and time. He appears well-developed and well-nourished. No distress.  HENT:  Head: Normocephalic. Head is with raccoon's eyes, with contusion and with right periorbital erythema.  Mouth/Throat: No trismus in the jaw.  Abrasion over right forehead. Mild jaw malocculsion  Eyes: Conjunctivae are  normal. Pupils are equal, round, and reactive to light. Right eye exhibits no discharge. Left eye exhibits no discharge. No scleral icterus.  Neck: Normal range of motion.  No midline tenderness  Cardiovascular: Normal rate and regular rhythm.  Exam reveals no gallop and no friction rub.   No murmur heard. Pulmonary/Chest: Effort normal and breath sounds normal. No respiratory distress. He has no wheezes. He has no rales. He exhibits no tenderness.  Abdominal: Soft. Bowel sounds are normal. He exhibits no distension and no mass. There is no tenderness. There is no rebound and no guarding. No hernia.  Bruising on center of abdomen  Musculoskeletal:  Right upper extremity: there is a significant amount of bruising and swelling of the right hand, wrist, and distal forearm. He is able to wiggle all his fingers. Abrasion noted as well on dorsal hand. N/V intact  Neurological: He is alert and oriented to person, place, and time.  Skin: Skin is warm and dry.  Psychiatric: He has a normal mood and affect. His behavior is normal.  Nursing note and vitals reviewed.    ED Treatments / Results  Labs (all labs ordered are listed, but only abnormal results are displayed) Labs Reviewed - No data to display  EKG  EKG Interpretation None       Radiology Dg Chest 2 View  Result Date: 02/26/2017 CLINICAL DATA:  Larey Seat down steps yesterday with right-sided rib pain EXAM: CHEST  2 VIEW COMPARISON:  Chest x-ray of 02/01/2016 FINDINGS: Prominent interstitial markings remain throughout the lungs most consistent with fibrotic change. No definite active infiltrate or effusion is seen. No pneumothorax is noted. Mediastinal hilar contours are unremarkable. The heart is within normal limits in size. Median sternotomy sutures are noted from prior CABG. No fracture is evident. IMPRESSION: Prominent interstitial markings appear chronic most consistent with fibrotic change. No definite active process is seen  Electronically Signed   By: Dwyane Dee M.D.   On: 02/26/2017 08:50   Ct Head Wo Contrast  Result Date: 02/26/2017 CLINICAL DATA:  Tripped, fell down steps yesterday. Right facial pain. EXAM: CT HEAD WITHOUT CONTRAST CT MAXILLOFACIAL WITHOUT CONTRAST TECHNIQUE:  Multidetector CT imaging of the head and maxillofacial structures were performed using the standard protocol without intravenous contrast. Multiplanar CT image reconstructions of the maxillofacial structures were also generated. COMPARISON:  None. FINDINGS: CT HEAD FINDINGS Brain: No acute intracranial abnormality. Specifically, no hemorrhage, hydrocephalus, mass lesion, acute infarction, or significant intracranial injury. Vascular: No hyperdense vessel or unexpected calcification. Skull: No acute calvarial abnormality. Concern for right lateral orbital wall fracture. Other: Locules of gas noted in the right temporal fossa. CT MAXILLOFACIAL FINDINGS Fracture noted through the right lateral orbital wall. No visible fracture through the right maxillary sinus, but there is a small air-fluid level in the right maxillary sinus with gas noted in the right face soft tissues extending from overlying the right lateral mandible superiorly into the right temporal fossa. No visible mandibular fracture. Temporomandibular joints appear located. IMPRESSION: No acute intracranial abnormality. Right lateral orbital wall fracture. While there is no definite visible right maxillary sinus fracture, concern is for occult fracture through the maxillary sinus wall with right facial soft tissue gas and small amount of layering fluid in the right maxillary sinus. Electronically Signed   By: Charlett Nose M.D.   On: 02/26/2017 09:08   Dg Hand Complete Right  Result Date: 02/26/2017 CLINICAL DATA:  Larey Seat down steps last night right hand swelling and pain EXAM: RIGHT HAND - COMPLETE 3+ VIEW COMPARISON:  None. FINDINGS: The right radiocarpal joint space appears normal and the  ulnar styloid is intact. The carpal bones are in normal position. However, on the lateral view there is a bony density along the dorsal aspect of the carpal bones most consistent with triquetral fracture with adjacent soft tissue swelling. There are degenerative changes involving the heads of the third and fourth metacarpals with degenerative change involving the DIP and PIP joints. No erosion is seen. IMPRESSION: 1. Triquetral fracture with adjacent soft tissue swelling. 2. Diffuse changes of osteoarthritis. Electronically Signed   By: Dwyane Dee M.D.   On: 02/26/2017 08:53   Ct Maxillofacial Wo Contrast  Result Date: 02/26/2017 CLINICAL DATA:  Tripped, fell down steps yesterday. Right facial pain. EXAM: CT HEAD WITHOUT CONTRAST CT MAXILLOFACIAL WITHOUT CONTRAST TECHNIQUE: Multidetector CT imaging of the head and maxillofacial structures were performed using the standard protocol without intravenous contrast. Multiplanar CT image reconstructions of the maxillofacial structures were also generated. COMPARISON:  None. FINDINGS: CT HEAD FINDINGS Brain: No acute intracranial abnormality. Specifically, no hemorrhage, hydrocephalus, mass lesion, acute infarction, or significant intracranial injury. Vascular: No hyperdense vessel or unexpected calcification. Skull: No acute calvarial abnormality. Concern for right lateral orbital wall fracture. Other: Locules of gas noted in the right temporal fossa. CT MAXILLOFACIAL FINDINGS Fracture noted through the right lateral orbital wall. No visible fracture through the right maxillary sinus, but there is a small air-fluid level in the right maxillary sinus with gas noted in the right face soft tissues extending from overlying the right lateral mandible superiorly into the right temporal fossa. No visible mandibular fracture. Temporomandibular joints appear located. IMPRESSION: No acute intracranial abnormality. Right lateral orbital wall fracture. While there is no definite  visible right maxillary sinus fracture, concern is for occult fracture through the maxillary sinus wall with right facial soft tissue gas and small amount of layering fluid in the right maxillary sinus. Electronically Signed   By: Charlett Nose M.D.   On: 02/26/2017 09:08    Procedures Procedures (including critical care time)  Medications Ordered in ED Medications  HYDROcodone-acetaminophen (NORCO/VICODIN) 5-325 MG per tablet 1  tablet (1 tablet Oral Given 02/26/17 0901)  diphenhydrAMINE (BENADRYL) capsule 25 mg (25 mg Oral Given 02/26/17 0901)     Initial Impression / Assessment and Plan / ED Course  I have reviewed the triage vital signs and the nursing notes.  Pertinent labs & imaging results that were available during my care of the patient were reviewed by me and considered in my medical decision making (see chart for details).  61 year old male with pain after mechanical fall. CT shows right lateral orbital fracture with possible occult fracture through the maxillary sinus wall. Spoke with Dr. Lazarus Salines who recommends conservative measures. He does not need to follow up with him. Xray of hand shows triquital fracture. Pt states he wants to go to Dr. Ophelia Charter and already has appointment this Friday. He was placed in an ulnar gutter splint. CT head negative and CXR unremarkable. Pain medicine rx given. Discussed RICE protocol. Return precautions given.  Final Clinical Impressions(s) / ED Diagnoses   Final diagnoses:  Fall, initial encounter  Closed fracture of right orbit, initial encounter (HCC)  Closed nondisplaced fracture of triquetrum of right wrist, initial encounter    New Prescriptions Discharge Medication List as of 02/26/2017 10:18 AM    START taking these medications   Details  diphenhydrAMINE (BENADRYL) 25 MG tablet Take 1 tablet (25 mg total) by mouth every 6 (six) hours as needed., Starting Tue 02/26/2017, Print    HYDROcodone-acetaminophen (NORCO/VICODIN) 5-325 MG tablet  Take 1 tablet by mouth every 4 (four) hours as needed., Starting Tue 02/26/2017, Print         Bethel Born, PA-C 02/26/17 1042    Pricilla Loveless, MD 03/01/17 [phone]

## 2017-03-01 ENCOUNTER — Ambulatory Visit (INDEPENDENT_AMBULATORY_CARE_PROVIDER_SITE_OTHER): Payer: Medicare Other | Admitting: Orthopaedic Surgery

## 2017-03-01 ENCOUNTER — Encounter (INDEPENDENT_AMBULATORY_CARE_PROVIDER_SITE_OTHER): Payer: Self-pay | Admitting: Orthopaedic Surgery

## 2017-03-01 VITALS — BP 122/79 | HR 68 | Ht 66.0 in | Wt 185.0 lb

## 2017-03-01 DIAGNOSIS — S62111A Displaced fracture of triquetrum [cuneiform] bone, right wrist, initial encounter for closed fracture: Secondary | ICD-10-CM

## 2017-03-01 HISTORY — DX: Displaced fracture of triquetrum (cuneiform) bone, right wrist, initial encounter for closed fracture: S62.111A

## 2017-03-01 NOTE — Progress Notes (Signed)
Office Visit Note   Patient: Frank [name]           Date of Birth: September 22, 1956           MRN: [id number] Visit Date: 03/01/2017              Requested by: Kathee Delton, MD [address] [place], [place] [address] PCP: Mickie Hillier, MD   Assessment & Plan: Visit Diagnoses:  1. Triquetral chip fracture, right, closed, initial encounter     Plan: Finger dressing applied since he has significant edema of his fingers from the injury. This is a ligamentous avulsion with a chip fracture off the triquetral bone. He will keep his hand elevated above his heart and continue to work on finger range of motion. I'll recheck him in 4 weeks.  Follow-Up Instructions: No Follow-up on file.   Orders:  No orders of the defined types were placed in this encounter.  No orders of the defined types were placed in this encounter.     Procedures: No procedures performed   Clinical Data: No additional findings.   Subjective: Chief Complaint  Patient presents with  . Right Wrist - Fracture, Injury, Pain    HPI patient fell 02/25/2017 was seen and [facility] ER he is walking his dog tripped down the steps. He suffered an orbital fracture noted on CT scan. Denies vision problems. He has some bruised ribs no pneumothorax. He was on some pain medication Vicodin now is just using occasional ibuprofen. Got a history of heart failure recommended he use plain Tylenol and then rarely use the ibuprofen. Denies elbow pain. He has some abrasions over his forearm which is healing nicely. He was in a fiberglass splint from the ER which also included his thumb.  Review of Systems  Constitutional: Negative for chills and diaphoresis.  HENT: Negative for ear discharge, ear pain and nosebleeds.   Eyes: Negative for discharge and visual disturbance.  Respiratory: Negative for cough, choking and shortness of breath.   Cardiovascular: Negative for chest pain and palpitations.  Gastrointestinal: Negative for  abdominal distention and abdominal pain.  Endocrine: Negative for cold intolerance and heat intolerance.  Genitourinary: Negative for flank pain and hematuria.  Skin: Negative for rash and wound.  Neurological: Negative for seizures and speech difficulty.  Hematological: Negative for adenopathy. Does not bruise/bleed easily.  Psychiatric/Behavioral: Negative for agitation and suicidal ideas.   positive history of heart failure, depression, hypertension, mitral regurg.   Objective: Vital Signs: BP 122/79   Pulse 68   Ht  (1.676 m)   Wt 185 lb (83.9 kg)   BMI 29.86 kg/m   Physical Exam  Constitutional: He is oriented to person, place, and time. He appears well-developed and well-nourished.  HENT:  Head: Normocephalic and atraumatic.  Eyes: EOM are normal. Pupils are equal, round, and reactive to light.  Patient has some periorbital ecchymosis from his orbit fracture.  Neck: No tracheal deviation present. No thyromegaly present.  Cardiovascular: Normal rate.   Pulmonary/Chest: Effort normal. He has no wheezes.  Abdominal: Soft. Bowel sounds are normal.  Musculoskeletal:  Diffuse digit swelling he lacks 6 cm touching fingertip to distal palmar crease dorsal hand swelling tenderness over the carpometacarpal joint. Good capillary refill.  Neurological: He is alert and oriented to person, place, and time.  Skin: Skin is warm and dry. Capillary refill takes less than 2 seconds.  Psychiatric: He has a normal mood and affect. His behavior is normal. Judgment  and thought content normal.    Ortho Exam cessation the fingertips is intact.  Specialty Comments:  No specialty comments available.  Imaging: No results found.   PMFS History: Patient Active Problem List   Diagnosis Date Noted  . Triquetral chip fracture, right, closed, initial encounter 03/01/2017  . Postnasal drip 12/27/2015  . Upper airway cough syndrome 10/04/2015  . Solitary pulmonary nodule 10/04/2015  .  Elevated LFTs 08/07/2015  . AKI (acute kidney injury) (HCC) 08/07/2015  . Coronary artery disease involving native coronary artery of native heart with unstable angina pectoris (HCC)   . Pain of right great toe 10/27/2014  . Tinea unguium 07/08/2014  . Eustachian tube disorder 07/08/2014  . Cough 07/08/2014  . Hives 12/31/2013  . Rib pain on right side 12/11/2013  . Pulmonary nodules/lesions, multiple 12/11/2013  . Dyslipidemia 10/27/2013  . Chronic anticoagulation 09/28/2013  . Actinic keratosis 10/31/2012  . Snoring disorder 10/31/2012  . Umbilical hernia 03/28/2012  . Dyspnea 03/20/2012  . Depression 01/02/2012  . Muscle spasm of both lower legs 01/02/2012  . Mitral regurgitation 06/14/2010  . CAD 04/07/2010  . Low HDL (under 40) 11/07/2009  . Essential hypertension, benign 11/07/2009  . Congestive heart failure 11/07/2009  . GERD 11/07/2009   Past Medical History:  Diagnosis Date  . Anxiety   . Arthritis    hands, shoulders, arms  . CAD (coronary artery disease)    5V CABG in 2004; stent to RCA x1 and SVG to acute marginal x2 2011;  PTCA/DES x 2 body of SVG to PDA  05/20/12  . CHF (congestive heart failure) (HCC)     EF 55% by echo 03/27/2012  . Chronic lower back pain    "w/activity"  . Depression   . GERD (gastroesophageal reflux disease)   . HLD (hyperlipidemia)   . Hypertension   . Low HDL (under 40)   . Mitral regurgitation    Moderate by TEE 03/27/12  . Myocardial infarction 2004  . Numbness and tingling in hands   . Numbness and tingling of both legs   . Umbilical hernia    unrepaired (05/20/12)    Family History  Problem Relation Age of Onset  . Heart disease Mother   . Cancer Mother     ? type  . Diabetes Father   . Hyperlipidemia Father   . Hypertension Father   . Stomach cancer Father   . Heart disease Brother 37    X 5 brothers    Past Surgical History:  Procedure Laterality Date  . CARDIAC CATHETERIZATION N/A 05/27/2015   Procedure: Left Heart  Cath and Coronary Angiography;  Surgeon: Kathleene Hazel, MD;  Location: [facility] INVASIVE CV LAB;  Service: Cardiovascular;  Laterality: N/A;  . CORONARY ANGIOPLASTY WITH STENT PLACEMENT  05/11   stent to RCA x 1 and SVG-acute marginal x2. EF normal  . CORONARY ANGIOPLASTY WITH STENT PLACEMENT  05/20/12   PTCA/DES x 2 body of SVG to PDA   . CORONARY ARTERY BYPASS GRAFT  2004   5v CABG   . INCISION AND DRAINAGE OF WOUND  2010   "from bite; maybe snake or spider; almost lost LLE"  . LEFT HEART CATHETERIZATION WITH CORONARY/GRAFT ANGIOGRAM N/A 05/20/2012   Procedure: LEFT HEART CATHETERIZATION WITH Isabel Caprice;  Surgeon: Kathleene Hazel, MD;  Location: [facility] CATH LAB;  Service: Cardiovascular;  Laterality: N/A;  . LEFT HEART CATHETERIZATION WITH CORONARY/GRAFT ANGIOGRAM N/A 10/30/2013   Procedure: LEFT HEART CATHETERIZATION WITH CORONARY/GRAFT ANGIOGRAM;  Surgeon: Kathleene Hazel, MD;  Location: [facility] CATH LAB;  Service: Cardiovascular;  Laterality: N/A;  . PERCUTANEOUS CORONARY STENT INTERVENTION (PCI-S)  10/30/2013   Procedure: PERCUTANEOUS CORONARY STENT INTERVENTION (PCI-S);  Surgeon: Kathleene Hazel, MD;  Location: [facility] CATH LAB;  Service: Cardiovascular;;  . PTCA  10/30/2013   DES  SVG    . TEE WITHOUT CARDIOVERSION  03/27/2012   Procedure: TRANSESOPHAGEAL ECHOCARDIOGRAM (TEE);  Surgeon: Laurey Morale, MD;  Location: [facility] ENDOSCOPY;  Service: Cardiovascular;  Laterality: N/A;  to be done at 1100   Social History   Occupational History  . 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 Smoking status: Former Smoker    Packs/day: 3.00    Years: 40.00    Types: Cigarettes    Quit date: 01/23/2003  . Smokeless tobacco: Never Used  . 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 Drug use: No     Comment: 05/20/12 "last marijuana was 2004"  . Sexual activity: Yes

## 2017-03-10 ENCOUNTER — Emergency Department (HOSPITAL_COMMUNITY)
Admission: EM | Admit: 2017-03-10 | Discharge: 2017-03-10 | Disposition: A | Discharge status: Home / Self Care | Payer: Medicare Other | Attending: Emergency Medicine | Admitting: Emergency Medicine

## 2017-03-10 ENCOUNTER — Encounter (HOSPITAL_COMMUNITY): Payer: Self-pay | Admitting: Emergency Medicine

## 2017-03-10 DIAGNOSIS — Z7982 Long term (current) use of aspirin: Secondary | ICD-10-CM | POA: Insufficient documentation

## 2017-03-10 DIAGNOSIS — W19XXXA Unspecified fall, initial encounter: Secondary | ICD-10-CM | POA: Insufficient documentation

## 2017-03-10 DIAGNOSIS — Y929 Unspecified place or not applicable: Secondary | ICD-10-CM | POA: Diagnosis not present

## 2017-03-10 DIAGNOSIS — Z952 Presence of prosthetic heart valve: Secondary | ICD-10-CM | POA: Insufficient documentation

## 2017-03-10 DIAGNOSIS — Z951 Presence of aortocoronary bypass graft: Secondary | ICD-10-CM | POA: Insufficient documentation

## 2017-03-10 DIAGNOSIS — Z79899 Other long term (current) drug therapy: Secondary | ICD-10-CM | POA: Insufficient documentation

## 2017-03-10 DIAGNOSIS — I252 Old myocardial infarction: Secondary | ICD-10-CM | POA: Diagnosis not present

## 2017-03-10 DIAGNOSIS — I509 Heart failure, unspecified: Secondary | ICD-10-CM | POA: Insufficient documentation

## 2017-03-10 DIAGNOSIS — Y939 Activity, unspecified: Secondary | ICD-10-CM | POA: Diagnosis not present

## 2017-03-10 DIAGNOSIS — Y999 Unspecified external cause status: Secondary | ICD-10-CM | POA: Diagnosis not present

## 2017-03-10 DIAGNOSIS — M25531 Pain in right wrist: Secondary | ICD-10-CM | POA: Insufficient documentation

## 2017-03-10 DIAGNOSIS — Z87891 Personal history of nicotine dependence: Secondary | ICD-10-CM | POA: Diagnosis not present

## 2017-03-10 DIAGNOSIS — I110 Hypertensive heart disease with heart failure: Secondary | ICD-10-CM | POA: Diagnosis not present

## 2017-03-10 DIAGNOSIS — I2510 Atherosclerotic heart disease of native coronary artery without angina pectoris: Secondary | ICD-10-CM | POA: Diagnosis not present

## 2017-03-10 DIAGNOSIS — R0781 Pleurodynia: Secondary | ICD-10-CM | POA: Diagnosis not present

## 2017-03-10 DIAGNOSIS — S299XXA Unspecified injury of thorax, initial encounter: Secondary | ICD-10-CM | POA: Diagnosis present

## 2017-03-10 MED ORDER — OXYCODONE-ACETAMINOPHEN 5-325 MG PO TABS: 1.0000 | ORAL_TABLET | ORAL | 0 refills | Status: DC | PRN

## 2017-03-10 NOTE — Discharge Instructions (Signed)
Use a pillow of folded towel held to your side to cough and take deep breaths several times a day.  Continue taking your ibuprofen as directed.  You may also apply ice packs on/off.  Follow-up with your primary doctor.  Return to ER for any worsening symptoms

## 2017-03-10 NOTE — ED Triage Notes (Signed)
Patient c/o right rib pain. Patient fell 2 weeks ago and was seen here in ER after fall. Per patient chest x-ray done then. Patient states pain progressively getting worse. Pain increases with deep breath and some shortness of breath with exertion reported by patient.

## 2017-03-10 NOTE — ED Provider Notes (Signed)
[facility]-EMER[facility]ENCY DEPT Provider Note   CSN: [id number] Arrival date & time: 03/10/17  9147     History   Chief Complaint Chief Complaint  Patient presents with  . Rib Injury    HPI Sahan R Arrambide is a 61 y.o. male.  HPI   Kush R Klaus is a 61 y.o. male who presents to the Emergency Department complaining of continued pain to his right lateral ribs.  He was seen here on 02/26/17 after a mechanical fall in which he injured his right wrist, face, and right ribs.  He states the rib pain has worsened since the fall.  He describes a sharp pain to the lateral ribs that is exacerbated by deep breathing, movement, coughing and sneezing.  Pain improves when at rest.  He states that he was given Vicodin for pain, but hasn't controlled the pain very well.  He denies shortness of breath, fever, increased cough, hemoptysis or abdominal pain. He is currently being treated by an orthopedic provider for his wrist pain.     Past Medical History:  Diagnosis Date  . Anxiety   . Arthritis    hands, shoulders, arms  . CAD (coronary artery disease)    5V CABG in 2004; stent to RCA x1 and SVG to acute marginal x2 2011;  PTCA/DES x 2 body of SVG to PDA  05/20/12  . CHF (congestive heart failure) (HCC)     EF 55% by echo 03/27/2012  . Chronic lower back pain    "w/activity"  . Depression   . GERD (gastroesophageal reflux disease)   . HLD (hyperlipidemia)   . Hypertension   . Low HDL (under 40)   . Mitral regurgitation    Moderate by TEE 03/27/12  . Myocardial infarction (HCC) 2004  . Numbness and tingling in hands   . Numbness and tingling of both legs   . Umbilical hernia    unrepaired (05/20/12)    Patient Active Problem List   Diagnosis Date Noted  . Triquetral chip fracture, right, closed, initial encounter 03/01/2017  . Postnasal drip 12/27/2015  . Upper airway cough syndrome 10/04/2015  . Solitary pulmonary nodule 10/04/2015  . Elevated LFTs 08/07/2015  . AKI (acute kidney injury) (HCC)  08/07/2015  . Coronary artery disease involving native coronary artery of native heart with unstable angina pectoris (HCC)   . Pain of right great toe 10/27/2014  . Tinea unguium 07/08/2014  . Eustachian tube disorder 07/08/2014  . Cough 07/08/2014  . Hives 12/31/2013  . Rib pain on right side 12/11/2013  . Pulmonary nodules/lesions, multiple 12/11/2013  . Dyslipidemia 10/27/2013  . Chronic anticoagulation 09/28/2013  . Actinic keratosis 10/31/2012  . Snoring disorder 10/31/2012  . Umbilical hernia 03/28/2012  . Dyspnea 03/20/2012  . Depression 01/02/2012  . Muscle spasm of both lower legs 01/02/2012  . Mitral regurgitation 06/14/2010  . CAD 04/07/2010  . Low HDL (under 40) 11/07/2009  . Essential hypertension, benign 11/07/2009  . Congestive heart failure 11/07/2009  . GERD 11/07/2009    Past Surgical History:  Procedure Laterality Date  . CARDIAC CATHETERIZATION N/A 05/27/2015   Procedure: Left Heart Cath and Coronary Angiography;  Surgeon: Kathleene Hazel, MD;  Location: [facility] INVASIVE CV LAB;  Service: Cardiovascular;  Laterality: N/A;  . CORONARY ANGIOPLASTY WITH STENT PLACEMENT  05/11   stent to RCA x 1 and SVG-acute marginal x2. EF normal  . CORONARY ANGIOPLASTY WITH STENT PLACEMENT  05/20/12   PTCA/DES x 2 body of SVG to PDA   .  CORONARY ARTERY BYPASS GRAFT  2004   5v CABG   . INCISION AND DRAINAGE OF WOUND  2010   "from bite; maybe snake or spider; almost lost LLE"  . LEFT HEART CATHETERIZATION WITH CORONARY/GRAFT ANGIOGRAM N/A 05/20/2012   Procedure: LEFT HEART CATHETERIZATION WITH Isabel Caprice;  Surgeon: Kathleene Hazel, MD;  Location: [facility] CATH LAB;  Service: Cardiovascular;  Laterality: N/A;  . LEFT HEART CATHETERIZATION WITH CORONARY/GRAFT ANGIOGRAM N/A 10/30/2013   Procedure: LEFT HEART CATHETERIZATION WITH Isabel Caprice;  Surgeon: Kathleene Hazel, MD;  Location: [facility] CATH LAB;  Service: Cardiovascular;  Laterality: N/A;  .  PERCUTANEOUS CORONARY STENT INTERVENTION (PCI-S)  10/30/2013   Procedure: PERCUTANEOUS CORONARY STENT INTERVENTION (PCI-S);  Surgeon: Kathleene Hazel, MD;  Location: [facility] CATH LAB;  Service: Cardiovascular;;  . PTCA  10/30/2013   DES  SVG    . TEE WITHOUT CARDIOVERSION  03/27/2012   Procedure: TRANSESOPHAGEAL ECHOCARDIOGRAM (TEE);  Surgeon: Laurey Morale, MD;  Location: [facility] ENDOSCOPY;  Service: Cardiovascular;  Laterality: N/A;  to be done at 1100       Home Medications    Prior to Admission medications   Medication Sig Start Date End Date Taking? Authorizing Provider  aspirin EC 81 MG tablet Take 81 mg by mouth daily.    Historical Provider, MD  citalopram (CELEXA) 40 MG tablet Take 1 tablet (40 mg total) by mouth daily. 10/19/16   Kathee Delton, MD  clopidogrel (PLAVIX) 75 MG tablet Take 1 tablet (75 mg total) by mouth daily. 05/21/16   Kathee Delton, MD  diphenhydrAMINE (BENADRYL) 25 MG tablet Take 1 tablet (25 mg total) by mouth every 6 (six) hours as needed. 02/26/17   Bethel Born, PA-C  fluticasone (FLONASE) 50 MCG/ACT nasal spray Place 2 sprays into both nostrils daily. Patient taking differently: Place 2 sprays into both nostrils as needed.  12/26/15   Kathee Delton, MD  furosemide (LASIX) 20 MG tablet Take 1 tablet (20 mg total) by mouth daily. 10/29/16   Kathleene Hazel, MD  metoprolol succinate (TOPROL-XL) 25 MG 24 hr tablet TAKE ONE TABLET BY MOUTH DAILY WITH OR IMMEDIATELY FOLLOWING A MEAL. 10/29/16   Kathleene Hazel, MD  Multiple Vitamin (MULTIVITAMIN WITH MINERALS) TABS tablet Take 1 tablet by mouth daily.    Historical Provider, MD  NITROSTAT 0.4 MG SL tablet DISSOLVE 1 TABLET UNDER TONGUE EVERY 5 MINUTES UP TO 15 MIN FOR CHESTPAIN. IF NO RELIEF CALL 911. 06/06/15   Kathleene Hazel, MD  pantoprazole (PROTONIX) 40 MG tablet TAKE 1 TABLET BY MOUTH ONCE DAILY AT 6:00 AM. 10/22/16   Kathleene Hazel, MD  potassium chloride SA (K-DUR,KLOR-CON) 20 MEQ  tablet TAKE 1 TABLET BY MOUTH ONCE A DAY. 07/11/16   Kathleene Hazel, MD  pravastatin (PRAVACHOL) 20 MG tablet Take 2 tablets (40 mg total) by mouth daily. 02/21/17   Kathee Delton, MD  ranolazine (RANEXA) 500 MG 12 hr tablet Take 1 tablet (500 mg total) by mouth 2 (two) times daily. 06/21/16   Kathleene Hazel, MD  tetrahydrozoline-zinc (VISINE-AC) 0.05-0.25 % ophthalmic solution Place 2 drops into both eyes 3 (three) times daily as needed (itching/burning).    Historical Provider, MD    Family History Family History  Problem Relation Age of Onset  . Heart disease Mother   . Cancer Mother     ? type  . Diabetes Father   . Hyperlipidemia Father   . Hypertension Father   .  Stomach cancer Father   . Heart disease Brother 64    X 5 brothers    Social History Social History  Substance Use Topics  . Smoking status: Former Smoker    Packs/day: 3.00    Years: 40.00    Types: Cigarettes    Quit date: 01/23/2003  . Smokeless tobacco: Never Used  . 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  Respiratory: Negative for cough, chest tightness, shortness of breath and wheezing.   Cardiovascular: Positive for chest pain (right rib pain).  Gastrointestinal: Negative for abdominal pain, nausea and vomiting.  Genitourinary: Negative for flank pain and hematuria.  Musculoskeletal: Positive for arthralgias (right wrist pain). Negative for joint swelling.  Skin: Negative for color change and wound.  Neurological: Negative for dizziness, weakness and numbness.  All other systems reviewed and are negative.    Physical Exam Updated Vital Signs BP 125/77 (BP Location: Left Arm)   Pulse (!) 57   Temp 97.6 F (36.4 C) (Oral)   Resp 15   Ht  (1.676 m)   Wt 83.9 kg   SpO2 97%   BMI  29.86 kg/m   Physical Exam  Constitutional: He is oriented to person, place, and time. He appears well-developed and well-nourished. No distress.  HENT:  Head: Atraumatic.  Mouth/Throat: Oropharynx is clear and moist.  Eyes: EOM are normal. Pupils are equal, round, and reactive to light.  Neck: Normal range of motion. No JVD present.  Cardiovascular: Normal rate, regular rhythm, normal heart sounds and intact distal pulses.   Pulmonary/Chest: Effort normal and breath sounds normal. No respiratory distress. He exhibits tenderness (focal ttp of the right lateral and anterior ribs.  no bruising or crepitus. ).  Abdominal: Soft. He exhibits no distension and no mass. There is no tenderness. There is no guarding.  Musculoskeletal: Normal range of motion.  Neurological: He is alert and oriented to person, place, and time.  Skin: Skin is warm. Capillary refill takes less than 2 seconds.  Nursing note and vitals reviewed.    ED Treatments / Results  Labs (all labs ordered are listed, but only abnormal results are displayed) Labs Reviewed - No data to display  EKG  EKG Interpretation None       Radiology No results found.  Procedures Procedures (including critical care time)  Medications Ordered in ED Medications - No data to display   Initial Impression / Assessment and Plan / ED Course  I have reviewed the triage vital signs and the nursing notes.  Pertinent labs & imaging results that were available during my care of the patient were reviewed by me and considered in my medical decision making (see chart for details).     Images from previous visit reviewed by me.  Pt is well appearing, vitals stable.  No tachycardia or tachypnea.  He has focal tenderness of the lateral ribs with hx of mechanical fall.  Discussed possible occult rib fx.  No concerning sx's for PE or PNA.  Pt appears stable for d/c, will address pain with short course of percocet, he will continue ibuprofen.   Return precautions discussed.   Final Clinical Impressions(s) / ED Diagnoses   Final diagnoses:  Rib pain on right side    New Prescriptions New  Prescriptions   No medications on file     Pauline Aus, Cordelia Poche 03/10/17 [id number]    Lavera Guise, MD 03/10/17 1710

## 2017-04-09 ENCOUNTER — Ambulatory Visit (INDEPENDENT_AMBULATORY_CARE_PROVIDER_SITE_OTHER): Payer: Medicare Other | Admitting: Orthopaedic Surgery

## 2017-04-09 ENCOUNTER — Encounter (INDEPENDENT_AMBULATORY_CARE_PROVIDER_SITE_OTHER): Payer: Self-pay

## 2017-04-09 ENCOUNTER — Ambulatory Visit (INDEPENDENT_AMBULATORY_CARE_PROVIDER_SITE_OTHER): Payer: Medicare Other

## 2017-04-09 ENCOUNTER — Encounter (INDEPENDENT_AMBULATORY_CARE_PROVIDER_SITE_OTHER): Payer: Self-pay | Admitting: Orthopaedic Surgery

## 2017-04-09 VITALS — BP 115/77 | HR 59 | Ht 66.0 in | Wt 185.0 lb

## 2017-04-09 DIAGNOSIS — S62111D Displaced fracture of triquetrum [cuneiform] bone, right wrist, subsequent encounter for fracture with routine healing: Secondary | ICD-10-CM | POA: Diagnosis not present

## 2017-04-09 DIAGNOSIS — S62111A Displaced fracture of triquetrum [cuneiform] bone, right wrist, initial encounter for closed fracture: Secondary | ICD-10-CM | POA: Diagnosis not present

## 2017-04-09 NOTE — Progress Notes (Signed)
Office Visit Note   Patient: Frank [name]           Date of Birth: 11/19/1956           MRN: [id number] Visit Date: 04/09/2017              Requested by: Kathee DeltonMcKeag, Ian D, MD [address] [place], [place] [id number] PCP: Kathee DeltonMcKeag, Ian D, MD   Assessment & Plan: Visit Diagnoses:  1. Triquetral chip fracture, right, with routine healing, subsequent encounter   2. Triquetral chip fracture, right, closed, initial encounter     Plan: I gave him several exercises to help loosen up his fingers. Before his wrist injury he did not have full flexion with fingertips to distal palmar crease and he understands that it is unlikely he'll be able to get more flexion than he had before he injured his wrist. He'll work on keeping his wrist in extended position flexing his MCPs PIPs DIP using his opposite hand  for passive pressure to improve his range of motion , he will work on work on extension as well. Patient does not want formal hand therapy call if he changes his mind. After 5 to 10 minutes work and 50% improvement in his flexion and should be able to get his motion back on his own.  Follow-Up Instructions: No Follow-up on file.   Orders:  Orders Placed This Encounter  Procedures  . XR Hand Complete Right   No orders of the defined types were placed in this encounter.     Procedures: No procedures performed   Clinical Data: No additional findings.   Subjective: Chief Complaint  Patient presents with  . Right Hand - Fracture, Follow-up    HPI patient returns for follow-up of triquetral chip fracture dorsal right wrist. He still having pain in his ribs. Some rib bruising negative for fracture. Patient disabled has a history of congestive heart failure. He's noticed stiffness in his fingers of the injured right hand.  Review of Systems 14 point review of systems updated and is unchanged from last office visit other than history of present illness   Objective: Vital Signs: BP  115/77   Pulse (!) 59   Ht 5\' 6"  (1.676 m)   Wt 185 lb (83.9 kg)   BMI 29.86 kg/m   Physical Exam  Constitutional: He is oriented to person, place, and time. He appears well-developed and well-nourished.  HENT:  Head: Normocephalic and atraumatic.  Eyes: EOM are normal. Pupils are equal, round, and reactive to light.  Neck: No tracheal deviation present. No thyromegaly present.  Cardiovascular: Normal rate.   Pulmonary/Chest: Effort normal. He has no wheezes.  Abdominal: Soft. Bowel sounds are normal.  Musculoskeletal:  Patient lacks 2.5 cm touching fingertips distal palmar crease. Opposite hand lacks 1.5 cm and he has arthritic changes in PIP DIP joints both hands. Dorsal wrist is nontender. He's been wearing the splint but can stop it now. No interossei weakness. Carpal canal palpation is normal.  Neurological: He is alert and oriented to person, place, and time.  Skin: Skin is warm and dry. Capillary refill takes less than 2 seconds.  Psychiatric: He has a normal mood and affect. His behavior is normal. Judgment and thought content normal.    Ortho Exam  Specialty Comments:  No specialty comments available.  Imaging: No results found.   PMFS History: Patient Active Problem List   Diagnosis Date Noted  . Triquetral chip fracture, right, closed, initial encounter 03/01/2017  .  Postnasal drip 12/27/2015  . Upper airway cough syndrome 10/04/2015  . Solitary pulmonary nodule 10/04/2015  . Elevated LFTs 08/07/2015  . AKI (acute kidney injury) (HCC) 08/07/2015  . Coronary artery disease involving native coronary artery of native heart with unstable angina pectoris (HCC)   . Pain of right great toe 10/27/2014  . Tinea unguium 07/08/2014  . Eustachian tube disorder 07/08/2014  . Cough 07/08/2014  . Hives 12/31/2013  . Rib pain on right side 12/11/2013  . Pulmonary nodules/lesions, multiple 12/11/2013  . Dyslipidemia 10/27/2013  . Chronic anticoagulation 09/28/2013  .  Actinic keratosis 10/31/2012  . Snoring disorder 10/31/2012  . Umbilical hernia 03/28/2012  . Dyspnea 03/20/2012  . Depression 01/02/2012  . Muscle spasm of both lower legs 01/02/2012  . Mitral regurgitation 06/14/2010  . CAD 04/07/2010  . Low HDL (under 40) 11/07/2009  . Essential hypertension, benign 11/07/2009  . Congestive heart failure 11/07/2009  . GERD 11/07/2009   Past Medical History:  Diagnosis Date  . Anxiety   . Arthritis    hands, shoulders, arms  . CAD (coronary artery disease)    5V CABG in 2004; stent to RCA x1 and SVG to acute marginal x2 2011;  PTCA/DES x 2 body of SVG to PDA  05/20/12  . CHF (congestive heart failure) (HCC)     EF 55% by echo 03/27/2012  . Chronic lower back pain    "w/activity"  . Depression   . GERD (gastroesophageal reflux disease)   . HLD (hyperlipidemia)   . Hypertension   . Low HDL (under 40)   . Mitral regurgitation    Moderate by TEE 03/27/12  . Myocardial infarction (HCC) 2004  . Numbness and tingling in hands   . Numbness and tingling of both legs   . Umbilical hernia    unrepaired (05/20/12)    Family History  Problem Relation Age of Onset  . Heart disease Mother   . Cancer Mother        ? type  . Diabetes Father   . Hyperlipidemia Father   . Hypertension Father   . Stomach cancer Father   . Heart disease Brother 18       X 5 brothers    Past Surgical History:  Procedure Laterality Date  . CARDIAC CATHETERIZATION N/A 05/27/2015   Procedure: Left Heart Cath and Coronary Angiography;  Surgeon: Kathleene Hazel, MD;  Location: [facility] INVASIVE CV LAB;  Service: Cardiovascular;  Laterality: N/A;  . CORONARY ANGIOPLASTY WITH STENT PLACEMENT  05/11   stent to RCA x 1 and SVG-acute marginal x2. EF normal  . CORONARY ANGIOPLASTY WITH STENT PLACEMENT  05/20/12   PTCA/DES x 2 body of SVG to PDA   . CORONARY ARTERY BYPASS GRAFT  2004   5v CABG   . INCISION AND DRAINAGE OF WOUND  2010   "from bite; maybe snake or spider; almost  lost LLE"  . LEFT HEART CATHETERIZATION WITH CORONARY/GRAFT ANGIOGRAM N/A 05/20/2012   Procedure: LEFT HEART CATHETERIZATION WITH Isabel Caprice;  Surgeon: Kathleene Hazel, MD;  Location: [facility] CATH LAB;  Service: Cardiovascular;  Laterality: N/A;  . LEFT HEART CATHETERIZATION WITH CORONARY/GRAFT ANGIOGRAM N/A 10/30/2013   Procedure: LEFT HEART CATHETERIZATION WITH Isabel Caprice;  Surgeon: Kathleene Hazel, MD;  Location: [facility] CATH LAB;  Service: Cardiovascular;  Laterality: N/A;  . PERCUTANEOUS CORONARY STENT INTERVENTION (PCI-S)  10/30/2013   Procedure: PERCUTANEOUS CORONARY STENT INTERVENTION (PCI-S);  Surgeon: Kathleene Hazel, MD;  Location: [facility] CATH LAB;  Service: Cardiovascular;;  . PTCA  10/30/2013   DES  SVG    . TEE WITHOUT CARDIOVERSION  03/27/2012   Procedure: TRANSESOPHAGEAL ECHOCARDIOGRAM (TEE);  Surgeon: Laurey Morale, MD;  Location: [facility] ENDOSCOPY;  Service: Cardiovascular;  Laterality: N/A;  to be done at 1100   Social History   Occupational History  . 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 Smoking status: Former Smoker    Packs/day: 3.00    Years: 40.00    Types: Cigarettes    Quit date: 01/23/2003  . Smokeless tobacco: Never Used  . 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 Drug use: No  . Sexual activity: Yes

## 2017-04-19 ENCOUNTER — Other Ambulatory Visit: Payer: Self-pay | Admitting: Cardiovascular Disease

## 2017-04-25 ENCOUNTER — Encounter: Payer: Self-pay | Admitting: Family Medicine

## 2017-04-25 ENCOUNTER — Ambulatory Visit (INDEPENDENT_AMBULATORY_CARE_PROVIDER_SITE_OTHER): Payer: Medicare Other | Admitting: Family Medicine

## 2017-04-25 VITALS — BP 126/78 | HR 57 | Temp 97.6°F | Wt 189.0 lb

## 2017-04-25 DIAGNOSIS — R911 Solitary pulmonary nodule: Secondary | ICD-10-CM

## 2017-04-25 DIAGNOSIS — M25641 Stiffness of right hand, not elsewhere classified: Secondary | ICD-10-CM | POA: Insufficient documentation

## 2017-04-25 DIAGNOSIS — M25649 Stiffness of unspecified hand, not elsewhere classified: Secondary | ICD-10-CM | POA: Diagnosis not present

## 2017-04-25 DIAGNOSIS — I739 Peripheral vascular disease, unspecified: Secondary | ICD-10-CM

## 2017-04-25 LAB — POCT SEDIMENTATION RATE: POCT SED RATE: 2 mm/h (ref 0–22)

## 2017-04-25 MED ORDER — PRAVASTATIN SODIUM 40 MG PO TABS: 40.0000 mg | ORAL_TABLET | Freq: Every day | ORAL | 3 refills | Status: DC

## 2017-04-25 NOTE — Patient Instructions (Signed)
It was a pleasure seeing you today in our clinic. Today we discussed your hand stiffness. Here is the treatment plan we have discussed and agreed upon together:   - I would like for you to see a physical therapist/occupational therapist to help provide stretching exercises for you to do at home. - We are obtaining labs today. I would like to have you back in our office in approximately 2 weeks to go over these labs and possibly draw some additional ones. - I have ordered a CT scan of your chest. This will be scheduled before you leave today. I would like to see you shortly after you get this performed. - I have ordered "ABIs" to be performed on your legs. My hope is to have this done at the same time as your CT scan. - Continue staying active. Take Tylenol for pain.

## 2017-04-25 NOTE — Progress Notes (Signed)
HPI  CC: Hand stiffness, Right Patient is here with complaints of right-handed stiffness and reduced range of motion. He states this is been worsening over the past few months. He was seen by orthopedic surgery recently who gave him home stretching exercises to perform. He states that these have not had any benefit since he started these. He denies any recent injury or trauma. He states that his hand feels "stiff" and "tight". He has noticed a small nodule on the flexor surface of his palms developing over his fourth metacarpal. Patient has also noticed some skin changes in the affected hand including slight color change and tightness in the skin.  Patient denies any fevers, chills, weight loss, weight gain, appetite changes, headache, blurred vision, shortness of breath, chest pain, nausea, vomiting, diarrhea. Denies muscle weakness.  Patient endorses symptoms consistent with claudication in his legs with prolonged walking (with associated numbness in his legs). He has not experienced similar symptoms in his upper extremities  Review of Systems See HPI for ROS.   CC, SH/smoking status, and VS noted  Objective: BP 126/78   Pulse (!) 57   Temp 97.6 F (36.4 C) (Oral)   Wt 189 lb (85.7 kg)   BMI 30.51 kg/m  Gen: NAD, alert, cooperative, and pleasant. CV: RRR, +2 murmur noted, no carotid or subclavian bruits appreciated. Peripheral pulses intact bilaterally but L>R (UE); Cap refill slowed in bilateral UEs (>2 sec) Neuro: Alert and oriented, Speech clear, No gross deficits, sensation intact throughout. Hand/Wrist, Right: Clubbing of all 5 fingers noted. Notable skin changes with skin tightness, hair loss, and pallor (specifically at the thenar eminence) when compared to the left side. No erythema, evidence of atrophy, or localized swelling. Some nodularity noted distally along the palm over fourth and fifth metacarpals. Reduced active/passive ROM in flexion of all 5 digits and wrist. Slight  reduction in extension as well. Strength is intact throughout. Sensation is intact throughout.   Assessment and plan:  Stiffness of right hand joint Patient is here with complaints of right hand stiffness. He reports no benefit with at home stretching exercises. He denies any injury or trauma. Etiology currently unknown at this time. 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 However, notable skin changes on the affected hand are concerning for other etiology. Differential includes scleroderma (focal>systemic), Dupuytren's contracture, peripheral vascular insufficiency, or other rheumatologic etiology. - Obtained CMP, CBC, ANA, and sedimentation rate. - Referral to physical therapy/occupational therapy provided today.     >> Assuming this is progressed osteoarthritis PT/OT will likely be the most beneficial to this patient long-term. - I have asked patient to come back to discuss these labs in 1-2 weeks. At that time, consideration for additional rheumatologic labs may be beneficial. - ABIs ordered (see below) >> consider further w/o of UEs if PVD seems to be a contributing factor. - Tylenol for pain. Avoid NSAIDS (cardiac Hx)  Solitary pulmonary nodule Patient was supposed to follow-up with repeated CT scan of his chest approximately 2 years ago. No additional imaging since that time. - Chest CT ordered today. - I have asked patient to make a follow-up appointment with me in 5-7 days to discuss the results of this scan  Claudication of both lower extremities (HCC) While discussing patient's upper extremity stiffness patient had reported symptoms consistent with claudication of his bilateral legs with prolonged ambulation. Patient has a extremely significant smoking history of approximately 40 years in which many years he was smoking up to 3 packs a day >> ~  120 packyear history. - ABIs ordered today. - Consideration for upper extremity vascular assessment in the future due to  current complaints and concerns for systemic PVD.   Orders Placed This Encounter  Procedures  . CT Chest Wo Contrast    Standing Status:   Future    Standing Expiration Date:   06/25/2018    Order Specific Question:   Reason for Exam (SYMPTOM  OR DIAGNOSIS REQUIRED)    Answer:   F/u lung nodule    Order Specific Question:   Preferred imaging location?    Answer:   [facility]    Order Specific Question:   Radiology Contrast Protocol - do NOT remove file path    Answer:   [email or web address]  . Comprehensive metabolic panel    Order Specific Question:   Has the patient fasted?    Answer:   No  . CBC  . ANA  . Ambulatory referral to Physical Therapy    Referral Priority:   Routine    Referral Type:   Physical Medicine    Referral Reason:   Specialty Services Required    Requested Specialty:   Physical Therapy    Number of Visits Requested:   1  . POCT SEDIMENTATION RATE    Meds ordered this encounter  Medications  . pravastatin (PRAVACHOL) 40 MG tablet    Sig: Take 1 tablet (40 mg total) by mouth daily.    Dispense:  90 tablet    Refill:  3     Kathee DeltonIan D Noemie Devivo, MD,MS,  PGY3 04/25/2017 11:17 AM

## 2017-04-25 NOTE — Assessment & Plan Note (Signed)
While discussing patient's upper extremity stiffness patient had reported symptoms consistent with claudication of his bilateral legs with prolonged ambulation. Patient has a extremely significant smoking history of approximately 40 years in which many years he was smoking up to 3 packs a day >> ~120 packyear history. - ABIs ordered today. - Consideration for upper extremity vascular assessment in the future due to current complaints and concerns for systemic PVD.

## 2017-04-25 NOTE — Assessment & Plan Note (Signed)
Patient is here with complaints of right hand stiffness. He reports no benefit with at home stretching exercises. He denies any injury or trauma. Etiology currently unknown at this time. 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 However, notable skin changes on the affected hand are concerning for other etiology. Differential includes scleroderma (focal>systemic), Dupuytren's contracture, peripheral vascular insufficiency, or other rheumatologic etiology. - Obtained CMP, CBC, ANA, and sedimentation rate. - Referral to physical therapy/occupational therapy provided today.     >> Assuming this is progressed osteoarthritis PT/OT will likely be the most beneficial to this patient long-term. - I have asked patient to come back to discuss these labs in 1-2 weeks. At that time, consideration for additional rheumatologic labs may be beneficial. - ABIs ordered (see below) >> consider further w/o of UEs if PVD seems to be a contributing factor. - Tylenol for pain. Avoid NSAIDS (cardiac Hx)

## 2017-04-25 NOTE — Assessment & Plan Note (Signed)
Patient was supposed to follow-up with repeated CT scan of his chest approximately 2 years ago. No additional imaging since that time. - Chest CT ordered today. - I have asked patient to make a follow-up appointment with me in 5-7 days to discuss the results of this scan

## 2017-04-26 ENCOUNTER — Telehealth: Payer: Self-pay | Admitting: Family Medicine

## 2017-04-26 LAB — COMPREHENSIVE METABOLIC PANEL
ALT: 17 IU/L (ref 0–44)
AST: 22 IU/L (ref 0–40)
Albumin/Globulin Ratio: 2.2 (ref 1.2–2.2)
Albumin: 4.6 g/dL (ref 3.6–4.8)
Alkaline Phosphatase: 119 IU/L — ABNORMAL HIGH (ref 39–117)
BUN/Creatinine Ratio: 11 (ref 10–24)
BUN: 13 mg/dL (ref 8–27)
Bilirubin Total: 0.4 mg/dL (ref 0.0–1.2)
CALCIUM: 9.2 mg/dL (ref 8.6–10.2)
CO2: 23 mmol/L (ref 18–29)
CREATININE: 1.15 mg/dL (ref 0.76–1.27)
Chloride: 105 mmol/L (ref 96–106)
GFR, EST AFRICAN AMERICAN: 78 mL/min/{1.73_m2} (ref >59)
GFR, EST NON AFRICAN AMERICAN: 68 mL/min/{1.73_m2} (ref >59)
Globulin, Total: 2.1 g/dL (ref 1.5–4.5)
Glucose: 104 mg/dL — ABNORMAL HIGH (ref 65–99)
Potassium: 4.6 mmol/L (ref 3.5–5.2)
Sodium: 144 mmol/L (ref 134–144)
TOTAL PROTEIN: 6.7 g/dL (ref 6.0–8.5)

## 2017-04-26 LAB — CBC
HEMATOCRIT: 42.9 % (ref 37.5–51.0)
HEMOGLOBIN: 14.3 g/dL (ref 13.0–17.7)
MCH: 29.4 pg (ref 26.6–33.0)
MCHC: 33.3 g/dL (ref 31.5–35.7)
MCV: 88 fL (ref 79–97)
Platelets: 185 10*3/uL (ref 150–379)
RBC: 4.86 x10E6/uL (ref 4.14–5.80)
RDW: 13.7 % (ref 12.3–15.4)
WBC: 6.3 10*3/uL (ref 3.4–10.8)

## 2017-04-26 LAB — ANA: ANA: NEGATIVE

## 2017-04-26 NOTE — Telephone Encounter (Signed)
Called patient left message informing of vascular ultrasound on 6/25 at 1:30pm at [place] [facility]. Patient to callback with any questions.

## 2017-04-26 NOTE — Telephone Encounter (Signed)
Pt was calling to check when the appointment will be for him to have x-rays of his leg. Please call with an update. jw

## 2017-05-02 ENCOUNTER — Ambulatory Visit (HOSPITAL_COMMUNITY)
Admission: RE | Admit: 2017-05-02 | Discharge: 2017-05-02 | Disposition: A | Discharge status: Home / Self Care | Payer: Medicare Other | Source: Ambulatory Visit | Attending: Family Medicine | Admitting: Family Medicine

## 2017-05-02 DIAGNOSIS — I700 Atherosclerosis of aorta: Secondary | ICD-10-CM | POA: Diagnosis not present

## 2017-05-02 DIAGNOSIS — R911 Solitary pulmonary nodule: Secondary | ICD-10-CM | POA: Diagnosis not present

## 2017-05-02 DIAGNOSIS — Z951 Presence of aortocoronary bypass graft: Secondary | ICD-10-CM | POA: Diagnosis not present

## 2017-05-02 DIAGNOSIS — J439 Emphysema, unspecified: Secondary | ICD-10-CM | POA: Diagnosis not present

## 2017-05-02 IMAGING — CT CT CHEST W/O CM
2 of 3 series · 15 of 36 positions shown, 18 images · non-contrast
Comparison: Chest radiograph [DATE] and chest CT BA

CLINICAL DATA: Lung nodule

EXAM:
CT CHEST WITHOUT CONTRAST
TECHNIQUE: Multidetector CT imaging of the chest was performed following the
standard protocol without IV contrast.

[Series 5: thorax 2.0 · axial · 0.72mm/px · z∈[+1179,+1431]mm · 12 of 150 slices shown, 15 images]
[im 12/150  mediastinal]
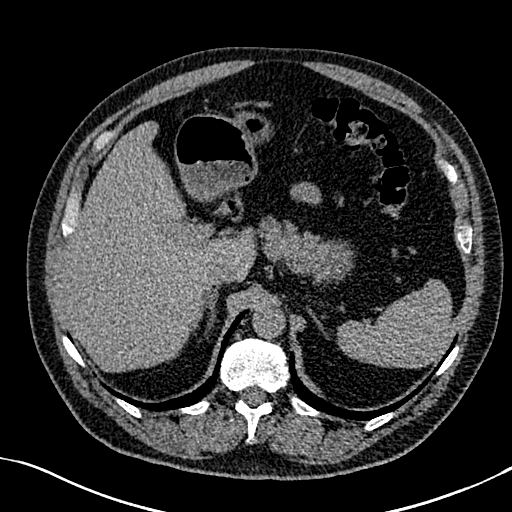
[im 12/150  lung]
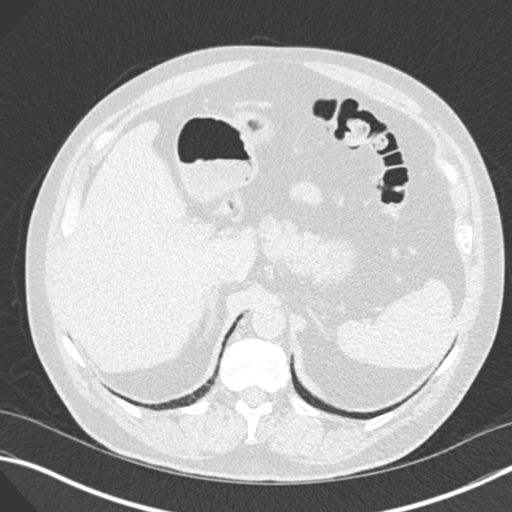
[im 23/150  lung]
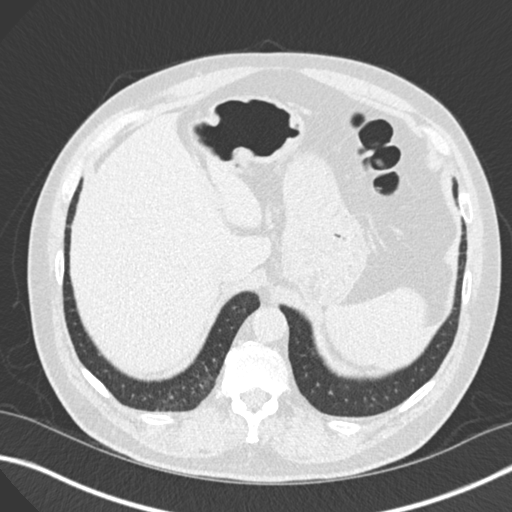
[im 34/150  lung]
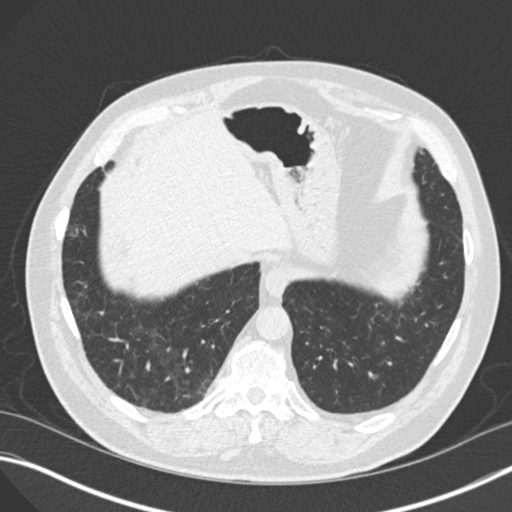
[im 45/150  lung]
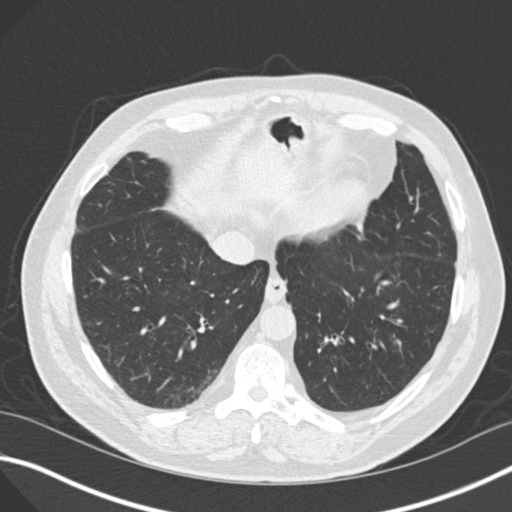
[im 56/150  mediastinal]
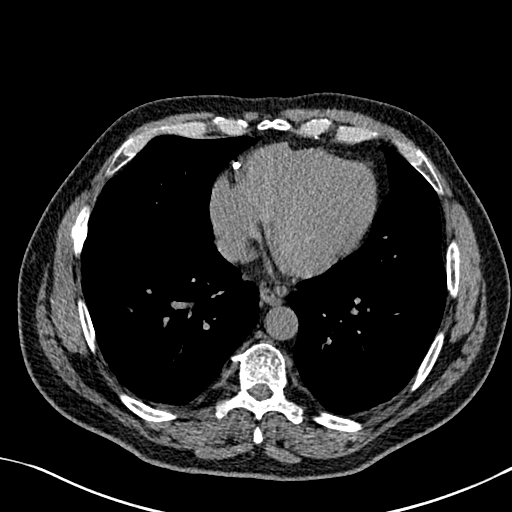
[im 56/150  lung]
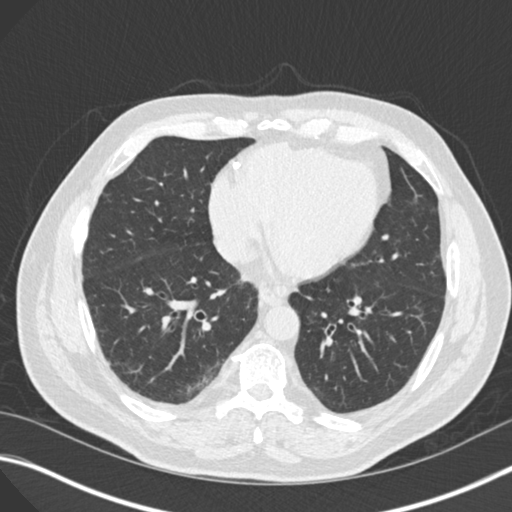
[im 67/150  lung]
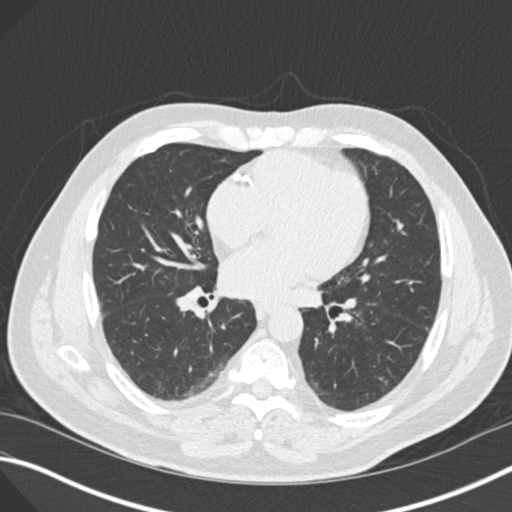
[im 83/150  lung]
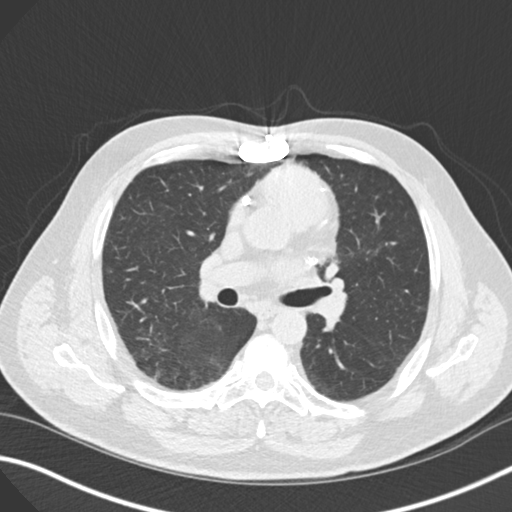
[im 94/150  lung]
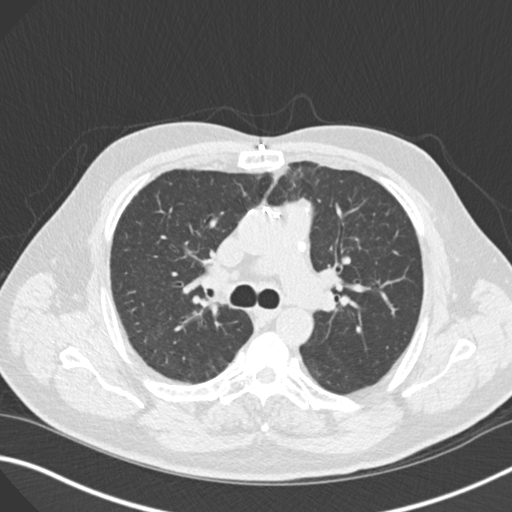
[im 105/150  mediastinal]
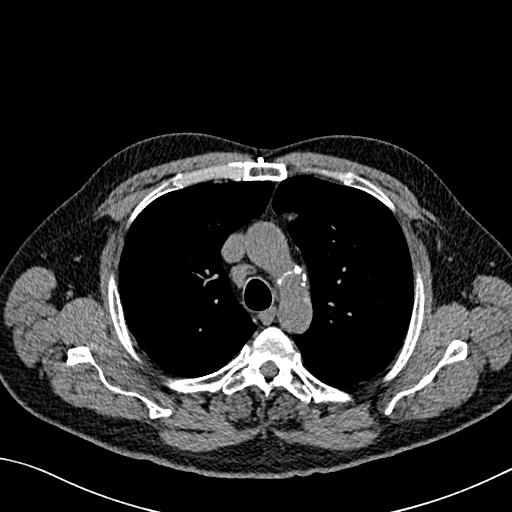
[im 105/150  lung]
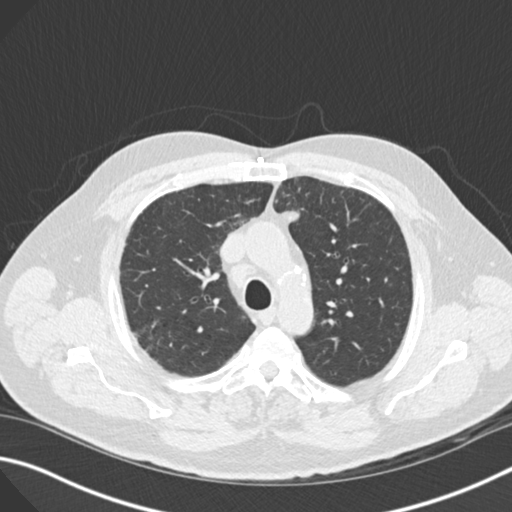
[im 116/150  lung]
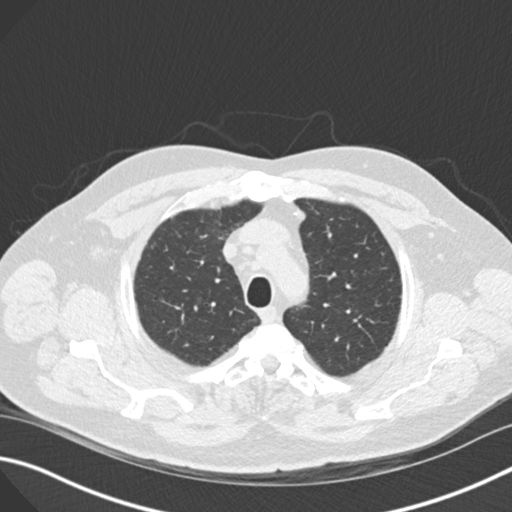
[im 127/150  lung]
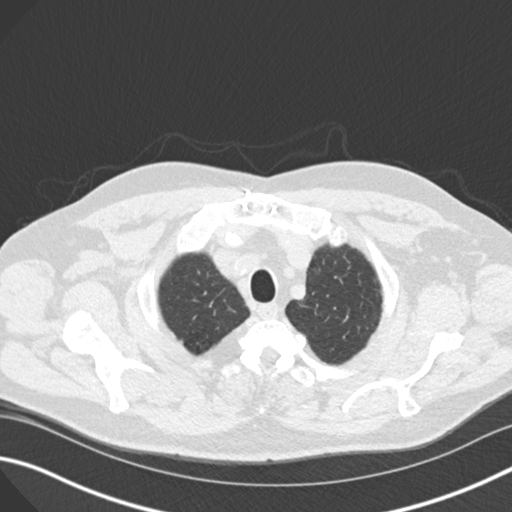
[im 138/150  lung]
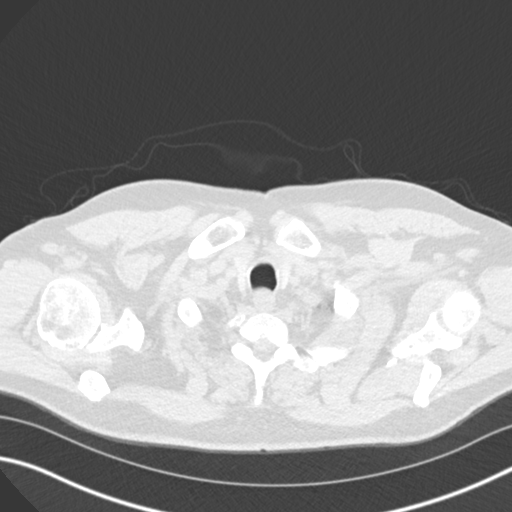

[Series 7: coronal · coronal · 0.59mm/px · 3 of 91 slices shown]
[im 19/91  lung]
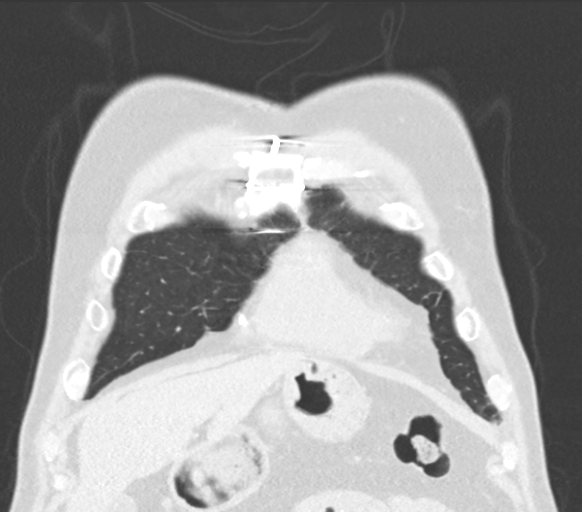
[im 37/91  lung]
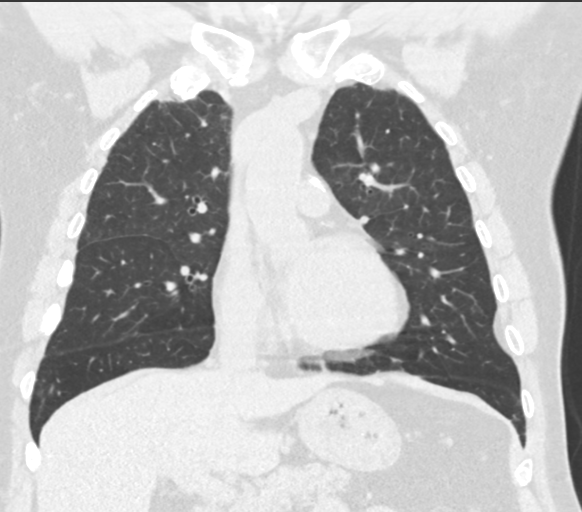
[im 55/91  lung]
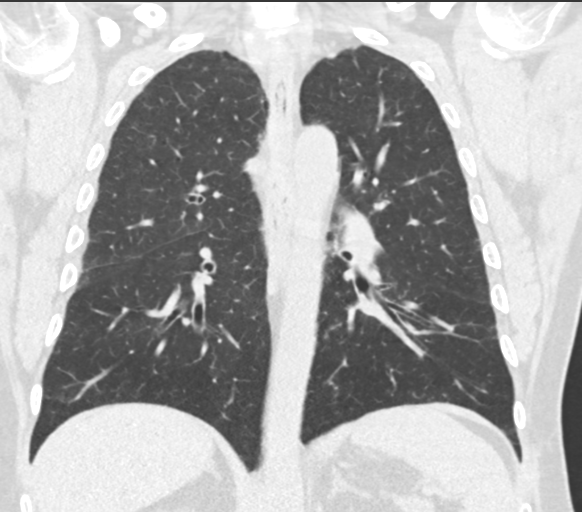

[15 of 36 positions shown; findings below may reference images not displayed]

FINDINGS: Cardiovascular: There is no abdominal aortic aneurysm. There is
atherosclerotic calcification in the aorta. There is minimal
calcification at the origin the right common carotid artery.
Visualized great vessels otherwise appear normal. Note that the
right and left common carotid arteries arise as a common trunk, an
anatomic variant. There are foci of coronary artery calcification.
Patient is status post coronary artery bypass grafting. Pericardium
is not appreciably thickened.

Mediastinum/Nodes: Thyroid appears unremarkable. There is a a focal
right peritracheal lymph node measuring 1.3 x 1.2 cm, stable. There
is a pretracheal lymph node measuring 1.3 x 0.9 cm. There is a left
hilar lymph node measuring 1.8 x 1.2 cm. No new areas of lymph node
prominence seen.

Lungs/Pleura: The previously noted ground-glass opacity in the left
lower lobe has resolved. There are scattered areas of parenchymal
lung scarring bilaterally. There is a degree of underlying
centrilobular emphysematous type change. There is slight lower lobe
bronchiectatic change bilaterally. There is a stable nodular opacity
superior segment of the left lower lobe measuring 3 mm. No new
opacity is evident. There is no pleural effusion or pleural
thickening.

Upper Abdomen: There is mild atherosclerotic calcification in the
upper abdominal aorta. Visualized upper abdominal structures are
otherwise normal.

Musculoskeletal: Patient is status post median sternotomy. There is
degenerative change in thoracic spine. There are no blastic or lytic
bone lesions.
IMPRESSION: The previously noted ground-glass opacity in the left lower lobe has
cleared. Areas of scattered scarring in are stable elsewhere. 3 mm
nodular opacity superior segment left lower lobe is stable. No new
opacity evident.

Mild lymph node prominence is noted in the right inferior
paratracheal and left hilar regions, stable. Suspect reactive
etiology.

Aortic and great vessel atherosclerosis. Patient is status post
coronary artery bypass grafting. No aneurysm.

Aortic Atherosclerosis ([7B]-[7B]) and Emphysema ([7B]-[7B]).

## 2017-05-07 ENCOUNTER — Ambulatory Visit: Payer: Medicare Other | Admitting: Family Medicine

## 2017-05-13 ENCOUNTER — Ambulatory Visit (HOSPITAL_COMMUNITY)
Admission: RE | Admit: 2017-05-13 | Discharge: 2017-05-13 | Disposition: A | Discharge status: Home / Self Care | Payer: Medicare Other | Source: Ambulatory Visit | Attending: Cardiology | Admitting: Cardiology

## 2017-05-13 DIAGNOSIS — I10 Essential (primary) hypertension: Secondary | ICD-10-CM | POA: Insufficient documentation

## 2017-05-13 DIAGNOSIS — I2510 Atherosclerotic heart disease of native coronary artery without angina pectoris: Secondary | ICD-10-CM | POA: Insufficient documentation

## 2017-05-13 DIAGNOSIS — Z87891 Personal history of nicotine dependence: Secondary | ICD-10-CM | POA: Insufficient documentation

## 2017-05-13 DIAGNOSIS — E785 Hyperlipidemia, unspecified: Secondary | ICD-10-CM | POA: Insufficient documentation

## 2017-05-13 DIAGNOSIS — I739 Peripheral vascular disease, unspecified: Secondary | ICD-10-CM | POA: Diagnosis not present

## 2017-05-13 DIAGNOSIS — Z951 Presence of aortocoronary bypass graft: Secondary | ICD-10-CM | POA: Insufficient documentation

## 2017-05-13 DIAGNOSIS — R202 Paresthesia of skin: Secondary | ICD-10-CM | POA: Insufficient documentation

## 2017-05-13 DIAGNOSIS — R200 Anesthesia of skin: Secondary | ICD-10-CM | POA: Diagnosis not present

## 2017-05-21 ENCOUNTER — Other Ambulatory Visit: Payer: Self-pay | Admitting: Cardiovascular Disease

## 2017-05-23 ENCOUNTER — Other Ambulatory Visit: Payer: Self-pay | Admitting: Cardiovascular Disease

## 2017-05-23 ENCOUNTER — Other Ambulatory Visit: Payer: Self-pay

## 2017-05-23 MED ORDER — CLOPIDOGREL BISULFATE 75 MG PO TABS: 75.0000 mg | ORAL_TABLET | Freq: Every day | ORAL | 0 refills | Status: DC

## 2017-05-23 NOTE — Telephone Encounter (Signed)
°*  STAT* If patient is at the pharmacy, call can be transferred to refill team.   1. Which medications need to be refilled? (please list name of each medication and dose if known) Plavix 75mg    2. Which pharmacy/location (including street and city if local pharmacy) is medication to be sent to?  [facility] in [place]   3. Do they need a 30 day or 90 day supply? 90

## 2017-06-28 ENCOUNTER — Encounter (INDEPENDENT_AMBULATORY_CARE_PROVIDER_SITE_OTHER): Payer: Self-pay

## 2017-06-28 ENCOUNTER — Ambulatory Visit (INDEPENDENT_AMBULATORY_CARE_PROVIDER_SITE_OTHER): Payer: Medicare Other | Admitting: Cardiovascular Disease

## 2017-06-28 ENCOUNTER — Encounter: Payer: Self-pay | Admitting: Cardiovascular Disease

## 2017-06-28 VITALS — BP 120/70 | HR 60 | Ht 66.0 in | Wt 192.0 lb

## 2017-06-28 DIAGNOSIS — I340 Nonrheumatic mitral (valve) insufficiency: Secondary | ICD-10-CM

## 2017-06-28 DIAGNOSIS — I10 Essential (primary) hypertension: Secondary | ICD-10-CM

## 2017-06-28 DIAGNOSIS — I5032 Chronic diastolic (congestive) heart failure: Secondary | ICD-10-CM

## 2017-06-28 DIAGNOSIS — I25118 Atherosclerotic heart disease of native coronary artery with other forms of angina pectoris: Secondary | ICD-10-CM | POA: Diagnosis not present

## 2017-06-28 DIAGNOSIS — E7800 Pure hypercholesterolemia, unspecified: Secondary | ICD-10-CM | POA: Diagnosis not present

## 2017-06-28 MED ORDER — PRAVASTATIN SODIUM 40 MG PO TABS: 40.0000 mg | ORAL_TABLET | Freq: Every day | ORAL | 3 refills | Status: DC

## 2017-06-28 NOTE — Patient Instructions (Addendum)
Medication Instructions:  Your physician has recommended you make the following change in your medication:  Start Pravastatin 40 mg by mouth daily.   Labwork: Your physician recommends that you return for lab work in: 12 weeks.  Scheduled for November 2,2018.  This will be fasting (lipid and liver profiles).  The lab opens at 7:30 AM   Testing/Procedures: none  Follow-Up: Your physician recommends that you schedule a follow-up appointment in: 6 months. Please call our office in about 3 months to schedule this appointment   Any Other Special Instructions Will Be Listed Below (If Applicable).     If you need a refill on your cardiac medications before your next appointment, please call your pharmacy.

## 2017-06-28 NOTE — Progress Notes (Signed)
Chief Complaint  Patient presents with  . Shortness of Breath     History of Present Illness: 61 yo male with a history of CAD s/p CABG 2004 with subsequent PCI, HLD, HTN and mitral regurgitation who is here today for cardiac follow up. He underwent 5V CABG in 2004. 2 drug eluting stents were placed in the SVG to to PDA in July 2013. At that time the LIMA to LAD was patent but the vein grafts to the OM, PLA and Diagonal were occluded. Repeat cath in December 2014 with recurrent severe disease in the SVG to PDA treated with 2 drug eluting stents. Echo August 2015 with normal LV function, mild MR. He was seen here in July 2016 with recurrent chest pain. Cardiac cath July 2016 with new occlusion of SVG to PDA. The only patent graft was the LIMA to LAD. Diffuse disease in native vessels. No targets for PCI. Ranexa was started. Normal ABI June 2018.   He is here today for follow up. The patient denies any chest pain, palpitations, lower extremity edema, orthopnea, PND, dizziness, near syncope or syncope. He has constant fatigue and dyspnea and this is not changed.    Primary Care Physician: [facility] Arlyce Harman, DO   Past Medical History:  Diagnosis Date  . Anxiety   . Arthritis    hands, shoulders, arms  . CAD (coronary artery disease)    5V CABG in 2004; stent to RCA x1 and SVG to acute marginal x2 2011;  PTCA/DES x 2 body of SVG to PDA  05/20/12  . CHF (congestive heart failure) (HCC)     EF 55% by echo 03/27/2012  . Chronic lower back pain    "w/activity"  . Depression   . GERD (gastroesophageal reflux disease)   . HLD (hyperlipidemia)   . Hypertension   . Low HDL (under 40)   . Mitral regurgitation    Moderate by TEE 03/27/12  . Myocardial infarction (HCC) 2004  . Numbness and tingling in hands   . Numbness and tingling of both legs   . Umbilical hernia    unrepaired (05/20/12)    Past Surgical History:  Procedure Laterality Date  . CARDIAC  CATHETERIZATION N/A 05/27/2015   Procedure: Left Heart Cath and Coronary Angiography;  Surgeon: Kathleene Hazel, MD;  Location: [facility] INVASIVE CV LAB;  Service: Cardiovascular;  Laterality: N/A;  . CORONARY ANGIOPLASTY WITH STENT PLACEMENT  05/11   stent to RCA x 1 and SVG-acute marginal x2. EF normal  . CORONARY ANGIOPLASTY WITH STENT PLACEMENT  05/20/12   PTCA/DES x 2 body of SVG to PDA   . CORONARY ARTERY BYPASS GRAFT  2004   5v CABG   . INCISION AND DRAINAGE OF WOUND  2010   "from bite; maybe snake or spider; almost lost LLE"  . LEFT HEART CATHETERIZATION WITH CORONARY/GRAFT ANGIOGRAM N/A 05/20/2012   Procedure: LEFT HEART CATHETERIZATION WITH Isabel Caprice;  Surgeon: Kathleene Hazel, MD;  Location: [facility] CATH LAB;  Service: Cardiovascular;  Laterality: N/A;  . LEFT HEART CATHETERIZATION WITH CORONARY/GRAFT ANGIOGRAM N/A 10/30/2013   Procedure: LEFT HEART CATHETERIZATION WITH Isabel Caprice;  Surgeon: Kathleene Hazel, MD;  Location: [facility] CATH LAB;  Service: Cardiovascular;  Laterality: N/A;  . PERCUTANEOUS CORONARY STENT INTERVENTION (PCI-S)  10/30/2013   Procedure: PERCUTANEOUS CORONARY STENT INTERVENTION (PCI-S);  Surgeon: Kathleene Hazel, MD;  Location: [facility] C[facility] LAB;  Service: Cardiovascular;;  . PTCA  10/30/2013   DES  SVG    .  TEE WITHOUT CARDIOVERSION  03/27/2012   Procedure: TRANSESOPHAGEAL ECHOCARDIOGRAM (TEE);  Surgeon: Laurey Morale, MD;  Location: [facility] ENDOSCOPY;  Service: Cardiovascular;  Laterality: N/A;  to be done at 1100    Current Outpatient Prescriptions  Medication Sig Dispense Refill  . aspirin EC 81 MG tablet Take 81 mg by mouth daily.    . citalopram (CELEXA) 40 MG tablet Take 1 tablet (40 mg total) by mouth daily. 90 tablet 3  . clopidogrel (PLAVIX) 75 MG tablet Take 1 tablet (75 mg total) by mouth daily. Please keep 06/28/17 for future refills 90 tablet 0  . diphenhydrAMINE (BENADRYL) 25 MG tablet Take 1 tablet (25 mg total) by  mouth every 6 (six) hours as needed. 20 tablet 0  . fluticasone (FLONASE) 50 MCG/ACT nasal spray Place 2 sprays into both nostrils daily as needed for allergies or rhinitis.    . furosemide (LASIX) 20 MG tablet Take 1 tablet (20 mg total) by mouth daily. 90 tablet 3  . metoprolol succinate (TOPROL-XL) 25 MG 24 hr tablet TAKE ONE TABLET BY MOUTH DAILY WITH OR IMMEDIATELY FOLLOWING A MEAL. 90 tablet 3  . Multiple Vitamin (MULTIVITAMIN WITH MINERALS) TABS tablet Take 1 tablet by mouth daily.    Marland Kitchen NITROSTAT 0.4 MG SL tablet DISSOLVE 1 TABLET UNDER TONGUE EVERY 5 MINUTES UP TO 15 MIN FOR CHESTPAIN. IF NO RELIEF CALL 911. 25 tablet 6  . oxyCODONE-acetaminophen (PERCOCET/ROXICET) 5-325 MG tablet Take 1 tablet by mouth every 4 (four) hours as needed. 10 tablet 0  . pantoprazole (PROTONIX) 40 MG tablet TAKE 1 TABLET BY MOUTH ONCE DAILY AT 6:00 AM. 90 tablet 1  . potassium chloride SA (K-DUR,KLOR-CON) 20 MEQ tablet Take 1 tablet (20 mEq total) by mouth daily. 90 tablet 1  . pravastatin (PRAVACHOL) 40 MG tablet Take 1 tablet (40 mg total) by mouth daily. 90 tablet 3  . ranolazine (RANEXA) 500 MG 12 hr tablet Take 1 tablet (500 mg total) by mouth 2 (two) times daily. 180 tablet 3  . tetrahydrozoline-zinc (VISINE-AC) 0.05-0.25 % ophthalmic solution Place 2 drops into both eyes 3 (three) times daily as needed (itching/burning).     No current facility-administered medications for this visit.     Allergies  Allergen Reactions  . Sulfa Antibiotics Rash  . Hydrocodone Hives and Itching    Social History   Social History  . Marital status: Married    Spouse name: N/A  . Number of children: 3  . Years of education: N/A   Occupational History  . 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 Smoking status: Former Smoker    Packs/day: 3.00    Years: 40.00    Types: Cigarettes    Quit date: 01/23/2003  . Smokeless tobacco: Never Used  . 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 Drug use: No  . Sexual activity: Yes   Other Topics Concern  . Not on file   Social History Narrative  . No narrative on file    Family History  Problem Relation Age of Onset  . Heart disease Mother   . Cancer Mother        ? type  . Diabetes Father   . Hyperlipidemia Father   . Hypertension Father   . Stomach cancer Father   . Heart disease Brother 41       X 5 brothers  Review of Systems:  As stated in the HPI and otherwise negative.   BP 120/70   Pulse 60   Ht 5\' 6"  (1.676 m)   Wt 192 lb (87.1 kg)   SpO2 95%   BMI 30.99 kg/m   Physical Examination:  General: Well developed, well nourished, NAD  HEENT: OP clear, mucus membranes moist  SKIN: warm, dry. No rashes. Neuro: No focal deficits  Musculoskeletal: Muscle strength 5/5 all ext  Psychiatric: Mood and affect normal  Neck: No JVD, no carotid bruits, no thyromegaly, no lymphadenopathy.  Lungs:Clear bilaterally, no wheezes, rhonci, crackles Cardiovascular: Regular rate and rhythm. No murmurs, gallops or rubs. Abdomen:Soft. Bowel sounds present. Non-tender.  Extremities: No lower extremity edema. Pulses are 2 + in the bilateral DP/PT.  Cardiac cath July 2016: Left Anterior Descending  The vessel is large .   Marland Kitchen Ost LAD to Prox LAD lesion, 100% stenosed. chronic total occlusion .      Ramus Intermedius  The vessel is moderate in size .   Marland Kitchen Ramus lesion, 50% stenosed. diffuse .     Left Circumflex   . Second Obtuse Marginal Branch   The vessel is small in size.   Suezanne Jacquet 2nd Mrg lesion, 80% stenosed. discrete . Very small caliber vessel.   . Third Obtuse Marginal Branch   The vessel is small in size.     Right Coronary Artery   . Prox RCA lesion, 30% stenosed. diffuse .   Marland Kitchen Dist RCA lesion, 90% stenosed. discrete .   Marland Kitchen Right Posterior Descending Artery   The vessel is small in size.   . Right Posterior Atrioventricular Branch   The vessel is small in  size.     Graft Angiography    Free LIMA Graft to Mid LAD  LIMA was injected is normal in caliber, and is anatomically normal.     Free Graft to 1st Diag  SVG was injected . There is severe disease in the graft. Graft is chronically occluded.   . Origin to Prox Graft lesion, 100% stenosed. chronic total occlusion .     Free Graft to 2nd Mrg  SVG was injected . There is severe disease in the graft. The graft is chronically occluded.   . Origin lesion, 100% stenosed. chronic total occlusion .     Free Graft to RPDA  SVG was injected . There is severe disease in the graft. The graft is occluded.   . Origin lesion, 100% stenosed. chronic total occlusion .       Coronary Diagrams   Diagnostic Diagram          Echo August 2015: Left ventricle: The cavity size was normal. Systolic function was normal. The estimated ejection fraction was in the range of 55% to 60%. Wall motion was normal; there were no regional wall motion abnormalities. Doppler parameters are consistent with abnormal left ventricular relaxation (grade 1 diastolic dysfunction). There was no evidence of elevated ventricular filling pressure by Doppler parameters. - Aortic valve: Trileaflet; normal thickness leaflets. There was no regurgitation. - Aortic root: The aortic root was normal in size. - Mitral valve: Thickened anterior leaflet of the mitral valve with mild prolapse. There was mild regurgitation directed posteriorly. - Left atrium: The atrium was mildly dilated. - Right ventricle: Systolic function was normal. - Right atrium: The atrium was normal in size. - Tricuspid valve: There was no regurgitation. - Pericardium, extracardiac: There was no pericardial effusion. Impressions: - Normal biventricular size and systolic  function. Mild prolapse of the anterior leaflet of the mitral valve with mild mitral regurgitation with posteriorly directed jet.  EKG:  EKG is ordered  today. The ekg ordered today demonstrates   Recent Labs: 04/25/2017: ALT 17; BUN 13; Creatinine, Ser 1.15; Hemoglobin 14.3; Platelets 185; Potassium 4.6; Sodium 144   Lipid Panel    Component Value Date/Time   CHOL 181 02/22/2016 0844   TRIG 198 (H) 02/22/2016 0844   HDL 47 02/22/2016 0844   CHOLHDL 3.9 02/22/2016 0844   VLDL 40 (H) 02/22/2016 0844   LDLCALC 94 02/22/2016 0844     Wt Readings from Last 3 Encounters:  06/28/17 192 lb (87.1 kg)  04/25/17 189 lb (85.7 kg)  04/09/17 185 lb (83.9 kg)     Other studies Reviewed: Additional studies/ records that were reviewed today include: . Review of the above records demonstrates:    Assessment and Plan:   1. CAD with stable angina: He is doing well with rare chest pains but no severe chest pain suggestive of unstable angina. Will continue Ranexa, ASA, Plavix, beta blocker. Not clear why statin was held in primary care and his not sure. Leg pain did not improve when statin stopped. LFTs are normal. Restart Pravastatin 40 mg daily.   2. HTN: BP controlled. No changes.   3. Hyperlipidemia:  Restart statin today. Lipids and LFTs in 12 weeks.   4. Mitral regurgitation: Mild by echo 2015. Discuss repeat echo when I see him back in 6 months.   5. Chronic diastolic CHF: Weight is stable. No LE edema. Continue daily Lasix.   Current medicines are reviewed at length with the patient today.  The patient does not have concerns regarding medicines.  The following changes have been made:  no change  Labs/ tests ordered today include:   Orders Placed This Encounter  Procedures  . Lipid Profile  . Hepatic function panel    Disposition:   FU with me in 6 months  Signed, Verne Carrowhristopher Kya Mayfield, MD 06/28/2017 8:36 AM    [facility] [address], [place], [place]  [address] Phone: [phone]; Fax: [phone]

## 2017-07-26 ENCOUNTER — Other Ambulatory Visit: Payer: Self-pay | Admitting: Cardiovascular Disease

## 2017-08-21 ENCOUNTER — Other Ambulatory Visit: Payer: Self-pay | Admitting: Cardiovascular Disease

## 2017-09-20 ENCOUNTER — Other Ambulatory Visit: Payer: Medicare Other

## 2017-10-14 ENCOUNTER — Other Ambulatory Visit: Payer: Self-pay | Admitting: Cardiovascular Disease

## 2017-12-09 ENCOUNTER — Other Ambulatory Visit: Payer: Self-pay | Admitting: Cardiovascular Disease

## 2017-12-24 NOTE — Progress Notes (Signed)
Chief Complaint  Patient presents with  . Coronary Artery Disease     History of Present Illness: 62 yo male with a history of CAD s/p CABG 2004 with subsequent PCI, HLD, HTN and mitral regurgitation who is here today for cardiac follow up. He underwent 5V CABG in 2004. 2 drug eluting stents were placed in the SVG to to PDA in July 2013. At that time the LIMA to LAD was patent but the vein grafts to the OM, PLA and Diagonal were occluded. Repeat cath in December 2014 with recurrent severe disease in the SVG to PDA treated with 2 drug eluting stents. Echo August 2015 with normal LV function, mild MR. He was seen here in July 2016 with recurrent chest pain. Cardiac cath July 2016 with new occlusion of SVG to PDA. The only patent graft was the LIMA to LAD. Diffuse disease in native vessels. No targets for PCI. Ranexa was started. Normal ABI June 2018.   He is here today for follow up. The patient denies any chest pain, palpitations, lower extremity edema, orthopnea, PND, dizziness, near syncope or syncope. He has chronic dyspnea and fatigue. No change.   Primary Care Physician: [facility] ( [facility], [place], [place])   Past Medical History:  Diagnosis Date  . Anxiety   . Arthritis    hands, shoulders, arms  . CAD (coronary artery disease)    5V CABG in 2004; stent to RCA x1 and SVG to acute marginal x2 2011;  PTCA/DES x 2 body of SVG to PDA  05/20/12  . CHF (congestive heart failure) ([place])     EF 55% by echo 03/27/2012  . Chronic lower back pain    "w/activity"  . Depression   . GERD (gastroesophageal reflux disease)   . HLD (hyperlipidemia)   . Hypertension   . Low HDL (under 40)   . Mitral regurgitation    Moderate by TEE 03/27/12  . Myocardial infarction ([place]) 2004  . Numbness and tingling in hands   . Numbness and tingling of both legs   . Umbilical hernia    unrepaired (05/20/12)    Past Surgical History:  Procedure Laterality Date  . CARDIAC CATHETERIZATION  N/A 05/27/2015   Procedure: Left Heart Cath and Coronary Angiography;  Surgeon: Burnell Blanks, MD;  Location: [facility];  Service: Cardiovascular;  Laterality: N/A;  . CORONARY ANGIOPLASTY WITH STENT PLACEMENT  05/11   stent to RCA x 1 and SVG-acute marginal x2. EF normal  . CORONARY ANGIOPLASTY WITH STENT PLACEMENT  05/20/12   PTCA/DES x 2 body of SVG to PDA   . CORONARY ARTERY BYPASS GRAFT  2004   5v CABG   . INCISION AND DRAINAGE OF WOUND  2010   "from bite; maybe snake or spider; almost lost LLE"  . LEFT HEART CATHETERIZATION WITH CORONARY/GRAFT ANGIOGRAM N/A 05/20/2012   Procedure: LEFT HEART CATHETERIZATION WITH Beatrix Fetters;  Surgeon: Burnell Blanks, MD;  Location: [facility] CATH LAB;  Service: Cardiovascular;  Laterality: N/A;  . LEFT HEART CATHETERIZATION WITH CORONARY/GRAFT ANGIOGRAM N/A 10/30/2013   Procedure: LEFT HEART CATHETERIZATION WITH Beatrix Fetters;  Surgeon: Burnell Blanks, MD;  Location: [facility] CATH LAB;  Service: Cardiovascular;  Laterality: N/A;  . PERCUTANEOUS CORONARY STENT INTERVENTION (PCI-S)  10/30/2013   Procedure: PERCUTANEOUS CORONARY STENT INTERVENTION (PCI-S);  Surgeon: Burnell Blanks, MD;  Location: [facility] CATH LAB;  Service: Cardiovascular;;  . PTCA  10/30/2013   DES  SVG    .  TEE WITHOUT CARDIOVERSION  03/27/2012   Procedure: TRANSESOPHAGEAL ECHOCARDIOGRAM (TEE);  Surgeon: Larey Dresser, MD;  Location: [facility] ENDOSCOPY;  Service: Cardiovascular;  Laterality: N/A;  to be done at 1100    Current Outpatient Medications  Medication Sig Dispense Refill  . aspirin EC 81 MG tablet Take 81 mg by mouth daily.    . citalopram (CELEXA) 40 MG tablet Take 1 tablet (40 mg total) by mouth daily. 90 tablet 3  . clopidogrel (PLAVIX) 75 MG tablet TAKE 1 TABLET BY MOUTH DAILY. 90 tablet 2  . diphenhydrAMINE (BENADRYL) 25 MG tablet Take 1 tablet (25 mg total) by mouth every 6 (six) hours as needed. 20 tablet 0  . fluticasone (FLONASE)  50 MCG/ACT nasal spray Place 2 sprays into both nostrils daily as needed for allergies or rhinitis.    . furosemide (LASIX) 20 MG tablet Take 1 tablet (20 mg total) by mouth daily. 90 tablet 3  . metoprolol succinate (TOPROL-XL) 25 MG 24 hr tablet TAKE ONE TABLET BY MOUTH DAILY WITH OR IMMEDIATELY FOLLOWING A MEAL. 90 tablet 2  . Multiple Vitamin (MULTIVITAMIN WITH MINERALS) TABS tablet Take 1 tablet by mouth daily.    . nitroGLYCERIN (NITROSTAT) 0.4 MG SL tablet DISSOLVE 1 TABLET UNDER TONGUE EVERY 5 MINUTES UP TO 15 MIN FOR CHESTPAIN. IF NO RELIEF CALL 911. 25 tablet 6  . pantoprazole (PROTONIX) 40 MG tablet TAKE 1 TABLET BY MOUTH ONCE DAILY AT 6:00 AM. 90 tablet 3  . potassium chloride SA (K-DUR,KLOR-CON) 20 MEQ tablet Take 1 tablet (20 mEq total) by mouth daily. 90 tablet 1  . pravastatin (PRAVACHOL) 40 MG tablet Take 1 tablet (40 mg total) by mouth daily. 90 tablet 3  . RANEXA 500 MG 12 hr tablet TAKE ONE TABLET BY MOUTH TWICE DAILY. 180 tablet 3  . tetrahydrozoline-zinc (VISINE-AC) 0.05-0.25 % ophthalmic solution Place 2 drops into both eyes 3 (three) times daily as needed (itching/burning).     No current facility-administered medications for this visit.     Allergies  Allergen Reactions  . Sulfa Antibiotics Rash  . Hydrocodone Hives and Itching    Social History   Socioeconomic History  . Marital status: Married    Spouse name: Not on file  . Number of children: 3  . Years of education: Not on file  . Highest education level: Not on file  Social Needs  . Financial resource strain: Not on file  . Food insecurity - worry: Not on file  . Food insecurity - inability: Not on file  . Transportation needs - medical: Not on file  . Transportation needs - non-medical: Not on file  Occupational History  . Occupation: Unemployed HVAC  Tobacco Use  . Smoking status: Former Smoker    Packs/day: 3.00    Years: 40.00    Pack years: 120.00    Types: Cigarettes    Last attempt to  quit: 01/23/2003    Years since quitting: 14.9  . Smokeless tobacco: Never Used  Substance and Sexual Activity  . 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 Drug use: No  . Sexual activity: Yes  Other Topics Concern  . Not on file  Social History Narrative  . Not on file    Family History  Problem Relation Age of Onset  . Heart disease Mother   . Cancer Mother        ? type  . Diabetes  Father   . Hyperlipidemia Father   . Hypertension Father   . Stomach cancer Father   . Heart disease Brother 60       X 5 brothers    Review of Systems:  As stated in the HPI and otherwise negative.   BP 133/77   Pulse (!) 59   Ht 5' 6"  (1.676 m)   Wt 194 lb 1.9 oz (88.1 kg)   SpO2 95%   BMI 31.33 kg/m   Physical Examination:  General: Well developed, well nourished, NAD  HEENT: OP clear, mucus membranes moist  SKIN: warm, dry. No rashes. Neuro: No focal deficits  Musculoskeletal: Muscle strength 5/5 all ext  Psychiatric: Mood and affect normal  Neck: No JVD, no carotid bruits, no thyromegaly, no lymphadenopathy.  Lungs:Clear bilaterally, no wheezes, rhonci, crackles Cardiovascular: Regular rate and rhythm. Soft systolic murmur noted.  Abdomen:Soft. Bowel sounds present. Non-tender.  Extremities: No lower extremity edema. Pulses are 2 + in the bilateral DP/PT.  Cardiac cath July 2016: Left Anterior Descending  The vessel is large .   Marland Kitchen Ost LAD to Prox LAD lesion, 100% stenosed. chronic total occlusion .      Ramus Intermedius  The vessel is moderate in size .   Marland Kitchen Ramus lesion, 50% stenosed. diffuse .     Left Circumflex   . Second Obtuse Marginal Branch   The vessel is small in size.   Colon Flattery 2nd Mrg lesion, 80% stenosed. discrete . Very small caliber vessel.   . Third Obtuse Marginal Branch   The vessel is small in size.     Right Coronary Artery   . Prox RCA lesion, 30% stenosed. diffuse .   Marland Kitchen Dist RCA  lesion, 90% stenosed. discrete .   Marland Kitchen Right Posterior Descending Artery   The vessel is small in size.   . Right Posterior Atrioventricular Branch   The vessel is small in size.     Graft Angiography    Free LIMA Graft to Mid LAD  LIMA was injected is normal in caliber, and is anatomically normal.     Free Graft to 1st Diag  SVG was injected . There is severe disease in the graft. Graft is chronically occluded.   . Origin to Prox Graft lesion, 100% stenosed. chronic total occlusion .     Free Graft to 2nd Mrg  SVG was injected . There is severe disease in the graft. The graft is chronically occluded.   . Origin lesion, 100% stenosed. chronic total occlusion .     Free Graft to RPDA  SVG was injected . There is severe disease in the graft. The graft is occluded.   . Origin lesion, 100% stenosed. chronic total occlusion .       Coronary Diagrams   Diagnostic Diagram          Echo August 2015: Left ventricle: The cavity size was normal. Systolic function was normal. The estimated ejection fraction was in the range of 55% to 60%. Wall motion was normal; there were no regional wall motion abnormalities. Doppler parameters are consistent with abnormal left ventricular relaxation (grade 1 diastolic dysfunction). There was no evidence of elevated ventricular filling pressure by Doppler parameters. - Aortic valve: Trileaflet; normal thickness leaflets. There was no regurgitation. - Aortic root: The aortic root was normal in size. - Mitral valve: Thickened anterior leaflet of the mitral valve with mild prolapse. There was mild regurgitation directed posteriorly. - Left atrium: The atrium was mildly  dilated. - Right ventricle: Systolic function was normal. - Right atrium: The atrium was normal in size. - Tricuspid valve: There was no regurgitation. - Pericardium, extracardiac: There was no pericardial effusion. Impressions: - Normal biventricular size and  systolic function. Mild prolapse of the anterior leaflet of the mitral valve with mild mitral regurgitation with posteriorly directed jet.  EKG:  EKG is ordered today. The ekg ordered today demonstrates sinus brady, rate 56 bpm. 1st degree AV block. Inferior Q waves. Unchanged.   Recent Labs: 04/25/2017: ALT 17; BUN 13; Creatinine, Ser 1.15; Hemoglobin 14.3; Platelets 185; Potassium 4.6; Sodium 144   Lipid Panel    Component Value Date/Time   CHOL 181 02/22/2016 0844   TRIG 198 (H) 02/22/2016 0844   HDL 47 02/22/2016 0844   CHOLHDL 3.9 02/22/2016 0844   VLDL 40 (H) 02/22/2016 0844   LDLCALC 94 02/22/2016 0844     Wt Readings from Last 3 Encounters:  12/25/17 194 lb 1.9 oz (88.1 kg)  06/28/17 192 lb (87.1 kg)  04/25/17 189 lb (85.7 kg)     Other studies Reviewed: Additional studies/ records that were reviewed today include: . Review of the above records demonstrates:    Assessment and Plan:   1. CAD with stable angina: He has stable angina. Will continue ASA, Plavix, Ranexa, statin and beta blocker.    2. HTN: BP is stable. No changes.   3. Hyperlipidemia: He is back on his statin. Will check lipids and CMET this am.   4. Mitral regurgitation: Mild by echo 2015. Will repeat echo now  5. Chronic diastolic CHF: Weight is stable. No LE edema. Continue Lasix.    Current medicines are reviewed at length with the patient today.  The patient does not have concerns regarding medicines.  The following changes have been made:  no change  Labs/ tests ordered today include:   Orders Placed This Encounter  Procedures  . Lipid Profile  . Comp Met (CMET)  . EKG 12-Lead  . ECHOCARDIOGRAM COMPLETE    Disposition:   FU with me in 6 months  Signed, Lauree Chandler, MD 12/25/2017 9:35 AM    [facility] [place], [place], [place]  [address] Phone: [phone]; Fax: [phone]

## 2017-12-25 ENCOUNTER — Ambulatory Visit (INDEPENDENT_AMBULATORY_CARE_PROVIDER_SITE_OTHER): Payer: Medicare Other | Admitting: Cardiovascular Disease

## 2017-12-25 ENCOUNTER — Encounter: Payer: Self-pay | Admitting: Cardiovascular Disease

## 2017-12-25 VITALS — BP 133/77 | HR 59 | Ht 66.0 in | Wt 194.1 lb

## 2017-12-25 DIAGNOSIS — I340 Nonrheumatic mitral (valve) insufficiency: Secondary | ICD-10-CM | POA: Diagnosis not present

## 2017-12-25 DIAGNOSIS — I25118 Atherosclerotic heart disease of native coronary artery with other forms of angina pectoris: Secondary | ICD-10-CM | POA: Diagnosis not present

## 2017-12-25 DIAGNOSIS — I5032 Chronic diastolic (congestive) heart failure: Secondary | ICD-10-CM

## 2017-12-25 DIAGNOSIS — I10 Essential (primary) hypertension: Secondary | ICD-10-CM | POA: Diagnosis not present

## 2017-12-25 DIAGNOSIS — E7800 Pure hypercholesterolemia, unspecified: Secondary | ICD-10-CM | POA: Diagnosis not present

## 2017-12-25 LAB — COMPREHENSIVE METABOLIC PANEL
ALBUMIN: 4.6 g/dL (ref 3.6–4.8)
ALT: 28 IU/L (ref 0–44)
AST: 29 IU/L (ref 0–40)
Albumin/Globulin Ratio: 2 (ref 1.2–2.2)
Alkaline Phosphatase: 115 IU/L (ref 39–117)
BILIRUBIN TOTAL: 0.5 mg/dL (ref 0.0–1.2)
BUN / CREAT RATIO: 15 (ref 10–24)
BUN: 18 mg/dL (ref 8–27)
CHLORIDE: 104 mmol/L (ref 96–106)
CO2: 21 mmol/L (ref 20–29)
Calcium: 8.9 mg/dL (ref 8.6–10.2)
Creatinine, Ser: 1.22 mg/dL (ref 0.76–1.27)
GFR calc Af Amer: 73 mL/min/{1.73_m2} (ref >59)
GFR calc non Af Amer: 63 mL/min/{1.73_m2} (ref >59)
GLOBULIN, TOTAL: 2.3 g/dL (ref 1.5–4.5)
GLUCOSE: 104 mg/dL — AB (ref 65–99)
Potassium: 4.4 mmol/L (ref 3.5–5.2)
SODIUM: 140 mmol/L (ref 134–144)
Total Protein: 6.9 g/dL (ref 6.0–8.5)

## 2017-12-25 LAB — LIPID PANEL
CHOLESTEROL TOTAL: 146 mg/dL (ref 100–199)
Chol/HDL Ratio: 3.5 ratio (ref 0.0–5.0)
HDL: 42 mg/dL (ref >39)
LDL Calculated: 70 mg/dL (ref 0–99)
Triglycerides: 169 mg/dL — ABNORMAL HIGH (ref 0–149)
VLDL Cholesterol Cal: 34 mg/dL (ref 5–40)

## 2017-12-25 MED ORDER — NITROGLYCERIN 0.4 MG SL SUBL: SUBLINGUAL_TABLET | SUBLINGUAL | 6 refills | Status: DC

## 2017-12-25 NOTE — Patient Instructions (Signed)
Medication Instructions:  Your physician recommends that you continue on your current medications as directed. Please refer to the Current Medication list given to you today.   Labwork: Lab work to be done today--lipid and cmet  Testing/Procedures: Your physician has requested that you have an echocardiogram. Echocardiography is a painless test that uses sound waves to create images of your heart. It provides your doctor with information about the size and shape of your heart and how well your heart's chambers and valves are working. This procedure takes approximately one hour. There are no restrictions for this procedure.    Follow-Up: Your physician recommends that you schedule a follow-up appointment in: 6 months. Please call our office in about 3 months to schedule this appointment    Any Other Special Instructions Will Be Listed Below (If Applicable).     If you need a refill on your cardiac medications before your next appointment, please call your pharmacy.

## 2017-12-28 ENCOUNTER — Other Ambulatory Visit: Payer: Self-pay | Admitting: Cardiovascular Disease

## 2017-12-30 ENCOUNTER — Ambulatory Visit (HOSPITAL_COMMUNITY)
Admission: RE | Admit: 2017-12-30 | Discharge: 2017-12-30 | Disposition: A | Discharge status: Home / Self Care | Payer: Medicare Other | Source: Ambulatory Visit | Attending: Cardiovascular Disease | Admitting: Cardiovascular Disease

## 2017-12-30 DIAGNOSIS — I051 Rheumatic mitral insufficiency: Secondary | ICD-10-CM | POA: Insufficient documentation

## 2017-12-30 DIAGNOSIS — I25118 Atherosclerotic heart disease of native coronary artery with other forms of angina pectoris: Secondary | ICD-10-CM | POA: Diagnosis not present

## 2017-12-30 DIAGNOSIS — I340 Nonrheumatic mitral (valve) insufficiency: Secondary | ICD-10-CM

## 2017-12-30 DIAGNOSIS — I5032 Chronic diastolic (congestive) heart failure: Secondary | ICD-10-CM | POA: Diagnosis not present

## 2017-12-30 LAB — ECHOCARDIOGRAM COMPLETE
CHL CUP DOP CALC LVOT VTI: 22.1 cm
CHL CUP RV SYS PRESS: 28 mmHg
E/e' ratio: 7.26
EWDT: 190 ms
FS: 29 % (ref 28–44)
IV/PV OW: 1.11
LA ID, A-P, ES: 35 mm
LA vol A4C: 52.5 ml
LA vol index: 24.6 mL/m2
LA vol: 50.6 mL
LADIAMINDEX: 1.7 cm/m2
LDCA: 2.84 cm2
LEFT ATRIUM END SYS DIAM: 35 mm
LV E/e' medial: 7.26
LV E/e'average: 7.26
LV PW d: 8.24 mm — AB (ref 0.6–1.1)
LV TDI E'LATERAL: 13.4
LV TDI E'MEDIAL: 9.14
LV dias vol index: 33 mL/m2
LV sys vol: 29 mL
LVDIAVOL: 69 mL (ref 62–150)
LVELAT: 13.4 cm/s
LVOT SV: 63 mL
LVOT diameter: 19 mm
LVOT peak grad rest: 4 mmHg
LVOT peak vel: 96.6 cm/s
LVSYSVOLIN: 14 mL/m2
Lateral S' vel: 10 cm/s
MV Dec: 190
MV VTI: 205 cm
MV pk A vel: 71.6 m/s
MV pk E vel: 97.3 m/s
MVPG: 4 mmHg
Reg peak vel: 250 cm/s
Simpson's disk: 58
Stroke v: 40 ml
TAPSE: 15.4 mm
TR max vel: 250 cm/s

## 2017-12-30 NOTE — Progress Notes (Signed)
*  PRELIMINARY RESULTS* Echocardiogram 2D Echocardiogram has been performed.  Stacey DrainWhite, Dontell Mian J 12/30/2017, 10:13 AM

## 2017-12-31 ENCOUNTER — Telehealth: Payer: Self-pay | Admitting: Cardiovascular Disease

## 2017-12-31 NOTE — Telephone Encounter (Signed)
Pt's wife returned call the test results and would like a call back please.

## 2017-12-31 NOTE — Telephone Encounter (Signed)
Patient wife (DPR) is aware of echo results. Per note below. Will forward to Dr. Gibson RampMcAlhany's nurse.   Notes recorded by Kathleene HazelMcAlhany, Christopher D, MD on 12/30/2017 at 11:10 AM EST LV function is normal. Mild mitral regurgitation. Can we let him know? Thanks, chris

## 2018-01-20 ENCOUNTER — Other Ambulatory Visit: Payer: Self-pay | Admitting: Cardiovascular Disease

## 2018-02-03 ENCOUNTER — Encounter: Payer: Medicare Other | Admitting: Family Medicine

## 2018-05-21 ENCOUNTER — Other Ambulatory Visit: Payer: Self-pay | Admitting: *Deleted

## 2018-05-21 ENCOUNTER — Other Ambulatory Visit: Payer: Self-pay | Admitting: Cardiovascular Disease

## 2018-05-21 NOTE — Telephone Encounter (Signed)
Outpatient Medication Detail    Disp Refills Start End   clopidogrel (PLAVIX) 75 MG tablet 90 tablet 1 05/21/2018    Sig: TAKE 1 TABLET BY MOUTH DAILY.   Sent to pharmacy as: clopidogrel (PLAVIX) 75 MG tablet   E-Prescribing Status: Receipt confirmed by pharmacy (05/21/2018 10:42 AM EDT)   Pharmacy   [facility] - [place], [place] - [address]

## 2018-05-26 MED ORDER — CITALOPRAM HYDROBROMIDE 40 MG PO TABS: 40.0000 mg | ORAL_TABLET | Freq: Every day | ORAL | 3 refills | Status: DC

## 2018-05-26 MED ORDER — CLOPIDOGREL BISULFATE 75 MG PO TABS: 75.0000 mg | ORAL_TABLET | Freq: Every day | ORAL | 1 refills | Status: DC

## 2018-05-26 NOTE — Telephone Encounter (Signed)
2nd request for celexa.

## 2018-07-01 ENCOUNTER — Other Ambulatory Visit: Payer: Self-pay

## 2018-07-01 ENCOUNTER — Encounter: Payer: Self-pay | Admitting: Family Medicine

## 2018-07-01 ENCOUNTER — Ambulatory Visit (INDEPENDENT_AMBULATORY_CARE_PROVIDER_SITE_OTHER): Payer: Medicare Other | Admitting: Family Medicine

## 2018-07-01 VITALS — BP 122/64 | HR 53 | Temp 98.0°F | Wt 192.0 lb

## 2018-07-01 DIAGNOSIS — G8929 Other chronic pain: Secondary | ICD-10-CM | POA: Diagnosis not present

## 2018-07-01 DIAGNOSIS — M25511 Pain in right shoulder: Secondary | ICD-10-CM | POA: Diagnosis not present

## 2018-07-01 DIAGNOSIS — M5441 Lumbago with sciatica, right side: Secondary | ICD-10-CM

## 2018-07-01 MED ORDER — GABAPENTIN 100 MG PO CAPS: 100.0000 mg | ORAL_CAPSULE | Freq: Two times a day (BID) | ORAL | 0 refills | Status: DC

## 2018-07-01 MED ORDER — CYCLOBENZAPRINE HCL 10 MG PO TABS: 10.0000 mg | ORAL_TABLET | Freq: Three times a day (TID) | ORAL | 0 refills | Status: DC | PRN

## 2018-07-01 NOTE — Patient Instructions (Addendum)
It was great to meet you today! Thank you for letting me participate in your care!  Today, we discussed your low back and hip pain that is most likely due to sciatic nerve impingement, more commonly called sciatica. I have given you a list of stretches and exercises to do at home. Please do these at least 4-5 times per week for 2 weeks. If it does not improve please return to see me in the clinic. I have given you a prescription for Cyclobenzaprine that may help with the pain. If you begin to have lose of bowel or bladder function or lose feeling in your groin seek care immediately.  I have also given you a muscle cream to help with the local inflammation, irritation, and pain. Please use it as directed. I will call you with the results of your lower back x-ray.   Please do the shoulder exercises for at least 4 weeks. If no improvement please schedule to come back and see me.  Be well, Jules Schickim Karah Caruthers, DO PGY-2, [facility]

## 2018-07-01 NOTE — Progress Notes (Signed)
Subjective: Chief Complaint  Patient presents with  . Hip Pain    HPI: Frank [name] is a 62 y.o. presenting to clinic today to discuss the following:  Hip Pain Patient reports 2 weeks of hip pain that is worse with walking, standing, and certain sitting positions. He states he had a similar pain in the past that self-resolved but this time it is not getting any better. The pain is described as 10/10 and sharp occurring everyday that is associated with tingling and numbness that radiates down his left leg. He has tried Ibuprofen without help. He knows he is not supposed to take it regularly.  Right Shoulder Pain Patient describes chronic right shoulder pain and weakness. He has had this for a long time but does not want to pursue surgery at this time.    Health Maintenance: Cologuard     ROS noted in HPI.   Past Medical, Surgical, Social, and Family History Reviewed & Updated per EMR.   Pertinent Historical Findings include:   Social History   Tobacco Use  Smoking Status Former Smoker  . Packs/day: 3.00  . Years: 40.00  . Pack years: 120.00  . Types: Cigarettes  . Last attempt to quit: 01/23/2003  . Years since quitting: 15.4  Smokeless Tobacco Never Used    Objective: BP 122/64   Pulse (!) 53   Temp 98 F (36.7 C) (Oral)   Wt 192 lb (87.1 kg)   SpO2 98%   BMI 30.99 kg/m  Vitals and nursing notes reviewed  Physical Exam Gen: Alert and Oriented x 3, NAD HEENT: Normocephalic, atraumatic CV: RRR, no murmurs, normal S1, S2 split Resp: CTAB, no wheezing, rales, or rhonchi, comfortable work of breathing MSK: Moves all four extremities; 5/5 strength LE bilaterally with gross sensation intact, range of motion of left hip limited in adduction and abduction. Normal flexion and extension. Right shoulder positive Juanetta GoslingHawkins' and Neer test. Negative empty can test. ROM of UE normal bilaterally with 5/5 strength in the left and 4/5 strength in the RUE. Ext: no clubbing,  cyanosis, or edema Skin: warm, dry, intact, no rashes  No results found for this or any previous visit (from the past 72 hour(s)).  Assessment/Plan:  Chronic right shoulder pain Etiology includes RCR tear vs impingement. Given he does show some weakness and a positive Juanetta GoslingHawkins' test a RCR tear cannot be ruled out however, given the chronicity of this issue I think impingement is more likely. Also, negative empty can test suggestive of more impingement.  Given home exercises for his right shoulder and if no improvement will send for outpatient PT.  Acute right-sided low back pain with right-sided sciatica Most likely Sciatica as patient history and physical exam are most consistent with sciatica as cause of his pain. Patient has no red flag symptoms (loss of bladder or bowel control or loss of sensation in the groin region). Will give a trial of Flexeril to along with home stretches and exercises to perform over the next weeks to see if he has any improvement. Given he has not had a lumbar x-ray I have ordered one today. Also giving him a trial of Capsaicin cream to use over his lower back.   PATIENT EDUCATION PROVIDED: See AVS    Diagnosis and plan along with any newly prescribed medication(s) were discussed in detail with this patient today. The patient verbalized understanding and agreed with the plan. Patient advised if symptoms worsen return to clinic or ER.  Health Maintainance:   Orders Placed This Encounter  Procedures  . DG Lumbar Spine Complete    Standing Status:   Future    Standing Expiration Date:   09/01/2019    Order Specific Question:   Reason for Exam (SYMPTOM  OR DIAGNOSIS REQUIRED)    Answer:   sciatica    Order Specific Question:   Preferred imaging location?    Answer:   [facility]    Order Specific Question:   Radiology Contrast Protocol - do NOT remove file path    Answer:   [email or web address]    Meds ordered  this encounter  Medications  . DISCONTD: gabapentin (NEURONTIN) 100 MG capsule    Sig: Take 1 capsule (100 mg total) by mouth 2 (two) times daily.    Dispense:  60 capsule    Refill:  0  . cyclobenzaprine (FLEXERIL) 10 MG tablet    Sig: Take 1 tablet (10 mg total) by mouth 3 (three) times daily as needed for muscle spasms.    Dispense:  90 tablet    Refill:  0     Tim Karen ChafeLockamy, DO 07/01/2018, 8:26 AM PGY-2 [facility]

## 2018-07-05 DIAGNOSIS — M25511 Pain in right shoulder: Secondary | ICD-10-CM

## 2018-07-05 DIAGNOSIS — M5441 Lumbago with sciatica, right side: Secondary | ICD-10-CM | POA: Insufficient documentation

## 2018-07-05 DIAGNOSIS — G8929 Other chronic pain: Secondary | ICD-10-CM | POA: Insufficient documentation

## 2018-07-05 NOTE — Assessment & Plan Note (Signed)
Etiology includes RCR tear vs impingement. Given he does show some weakness and a positive Juanetta GoslingHawkins' test a RCR tear cannot be ruled out however, given the chronicity of this issue I think impingement is more likely. Also, negative empty can test suggestive of more impingement.  Given home exercises for his right shoulder and if no improvement will send for outpatient PT.

## 2018-07-05 NOTE — Assessment & Plan Note (Signed)
Most likely Sciatica as patient history and physical exam are most consistent with sciatica as cause of his pain. Patient has no red flag symptoms (loss of bladder or bowel control or loss of sensation in the groin region). Will give a trial of Flexeril to along with home stretches and exercises to perform over the next weeks to see if he has any improvement. Given he has not had a lumbar x-ray I have ordered one today. Also giving him a trial of Capsaicin cream to use over his lower back.

## 2018-07-14 DIAGNOSIS — Z1211 Encounter for screening for malignant neoplasm of colon: Secondary | ICD-10-CM | POA: Diagnosis not present

## 2018-07-14 LAB — COLOGUARD: Cologuard: NEGATIVE

## 2018-07-24 ENCOUNTER — Other Ambulatory Visit: Payer: Self-pay | Admitting: Cardiovascular Disease

## 2018-09-01 ENCOUNTER — Other Ambulatory Visit: Payer: Self-pay | Admitting: Cardiovascular Disease

## 2018-09-03 NOTE — Progress Notes (Signed)
Chief Complaint  Patient presents with  . Follow-up    CAD     History of Present Illness: 62 yo male with a history of CAD s/p CABG 2004 with subsequent PCI, HLD, HTN and mitral regurgitation who is here today for cardiac follow up. He underwent 5V CABG in 2004. 2 drug eluting stents were placed in the SVG to to PDA in July 2013. At that time the LIMA to LAD was patent but the vein grafts to the OM, PLA and Diagonal were occluded. Repeat cath in December 2014 with recurrent severe disease in the SVG to PDA treated with 2 drug eluting stents. Echo August 2015 with normal LV function, mild MR. He was seen here in July 2016 with recurrent chest pain. Cardiac cath July 2016 with new occlusion of SVG to PDA. The only patent graft was the LIMA to LAD. Diffuse disease in native vessels. No targets for PCI. Ranexa was started. Normal ABI June 2018. Echo 12/30/17 with LVEF=55-60%, mild to moderate MR.   He is here today for follow up. The patient denies any chest pain, dyspnea, palpitations, lower extremity edema, orthopnea, PND, dizziness, near syncope or syncope. He has occasional days with fatigue but most days he feels great.   Primary Care Physician: [facility] ( Karen Chafe, [place], [place])   Past Medical History:  Diagnosis Date  . Anxiety   . Arthritis    hands, shoulders, arms  . CAD (coronary artery disease)    5V CABG in 2004; stent to RCA x1 and SVG to acute marginal x2 2011;  PTCA/DES x 2 body of SVG to PDA  05/20/12  . CHF (congestive heart failure) (HCC)     EF 55% by echo 03/27/2012  . Chronic lower back pain    "w/activity"  . Depression   . GERD (gastroesophageal reflux disease)   . HLD (hyperlipidemia)   . Hypertension   . Low HDL (under 40)   . Mitral regurgitation    Moderate by TEE 03/27/12  . Myocardial infarction (HCC) 2004  . Numbness and tingling in hands   . Numbness and tingling of both legs   . Umbilical hernia    unrepaired (05/20/12)    Past  Surgical History:  Procedure Laterality Date  . CARDIAC CATHETERIZATION N/A 05/27/2015   Procedure: Left Heart Cath and Coronary Angiography;  Surgeon: Kathleene Hazel, MD;  Location: [facility] INVASIVE CV LAB;  Service: Cardiovascular;  Laterality: N/A;  . CORONARY ANGIOPLASTY WITH STENT PLACEMENT  05/11   stent to RCA x 1 and SVG-acute marginal x2. EF normal  . CORONARY ANGIOPLASTY WITH STENT PLACEMENT  05/20/12   PTCA/DES x 2 body of SVG to PDA   . CORONARY ARTERY BYPASS GRAFT  2004   5v CABG   . INCISION AND DRAINAGE OF WOUND  2010   "from bite; maybe snake or spider; almost lost LLE"  . LEFT HEART CATHETERIZATION WITH CORONARY/GRAFT ANGIOGRAM N/A 05/20/2012   Procedure: LEFT HEART CATHETERIZATION WITH Isabel Caprice;  Surgeon: Kathleene Hazel, MD;  Location: [facility] CATH LAB;  Service: Cardiovascular;  Laterality: N/A;  . LEFT HEART CATHETERIZATION WITH CORONARY/GRAFT ANGIOGRAM N/A 10/30/2013   Procedure: LEFT HEART CATHETERIZATION WITH Isabel Caprice;  Surgeon: Kathleene Hazel, MD;  Location: [facility] CATH LAB;  Service: Cardiovascular;  Laterality: N/A;  . PERCUTANEOUS CORONARY STENT INTERVENTION (PCI-S)  10/30/2013   Procedure: PERCUTANEOUS CORONARY STENT INTERVENTION (PCI-S);  Surgeon: Kathleene Hazel, MD;  Location: [facility] CATH LAB;  Service:  Cardiovascular;;  . PTCA  10/30/2013   DES  SVG    . TEE WITHOUT CARDIOVERSION  03/27/2012   Procedure: TRANSESOPHAGEAL ECHOCARDIOGRAM (TEE);  Surgeon: Laurey Morale, MD;  Location: [facility] ENDOSCOPY;  Service: Cardiovascular;  Laterality: N/A;  to be done at 1100    Current Outpatient Medications  Medication Sig Dispense Refill  . aspirin EC 81 MG tablet Take 81 mg by mouth daily.    . citalopram (CELEXA) 40 MG tablet Take 1 tablet (40 mg total) by mouth daily. 90 tablet 3  . clopidogrel (PLAVIX) 75 MG tablet Take 1 tablet (75 mg total) by mouth daily. 90 tablet 3  . cyclobenzaprine (FLEXERIL) 10 MG tablet Take 1 tablet (10  mg total) by mouth 3 (three) times daily as needed for muscle spasms. 90 tablet 0  . fluticasone (FLONASE) 50 MCG/ACT nasal spray Place 2 sprays into both nostrils daily as needed for allergies or rhinitis.    . furosemide (LASIX) 20 MG tablet Take 1 tablet (20 mg total) by mouth daily. 90 tablet 3  . metoprolol succinate (TOPROL-XL) 25 MG 24 hr tablet TAKE ONE TABLET BY MOUTH DAILY WITH OR IMMEDIATELY FOLLOWING A MEAL. 90 tablet 3  . Multiple Vitamin (MULTIVITAMIN WITH MINERALS) TABS tablet Take 1 tablet by mouth daily.    . nitroGLYCERIN (NITROSTAT) 0.4 MG SL tablet DISSOLVE 1 TABLET UNDER TONGUE EVERY 5 MINUTES UP TO 15 MIN FOR CHESTPAIN. IF NO RELIEF CALL 911. 25 tablet 6  . pantoprazole (PROTONIX) 40 MG tablet TAKE 1 TABLET BY MOUTH ONCE DAILY AT 6:00 AM. 90 tablet 3  . potassium chloride SA (K-DUR,KLOR-CON) 20 MEQ tablet Take 1 tablet (20 mEq total) by mouth daily. 90 tablet 3  . pravastatin (PRAVACHOL) 40 MG tablet Take 1 tablet (40 mg total) by mouth daily. 90 tablet 3  . ranolazine (RANEXA) 500 MG 12 hr tablet Take 1 tablet (500 mg total) by mouth 2 (two) times daily. 180 tablet 3  . tetrahydrozoline-zinc (VISINE-AC) 0.05-0.25 % ophthalmic solution Place 2 drops into both eyes 3 (three) times daily as needed (itching/burning).    . gabapentin (NEURONTIN) 100 MG capsule Take 1 capsule by mouth daily.     No current facility-administered medications for this visit.     Allergies  Allergen Reactions  . Sulfa Antibiotics Rash  . Hydrocodone Hives and Itching    Social History   Socioeconomic History  . Marital status: Married    Spouse name: Not on file  . Number of children: 3  . Years of education: Not on file  . Highest education level: Not on file  Occupational History  . Occupation: Unemployed Forensic psychologist Needs  . Financial resource strain: Not on file  . Food insecurity:    Worry: Not on file    Inability: Not on file  . Transportation needs:    Medical: Not on file      Non-medical: Not on file  Tobacco Use  . Smoking status: Former Smoker    Packs/day: 3.00    Years: 40.00    Pack years: 120.00    Types: Cigarettes    Last attempt to quit: 01/23/2003    Years since quitting: 15.6  . Smokeless tobacco: Never Used  Substance and Sexual Activity  . 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 Drug use: No  . Sexual activity: Yes  Lifestyle  . Physical activity:  Days per week: Not on file    Minutes per session: Not on file  . Stress: Not on file  Relationships  . Social connections:    Talks on phone: Not on file    Gets together: Not on file    Attends religious service: Not on file    Active member of club or organization: Not on file    Attends meetings of clubs or organizations: Not on file    Relationship status: Not on file  . Intimate partner violence:    Fear of current or ex partner: Not on file    Emotionally abused: Not on file    Physically abused: Not on file    Forced sexual activity: Not on file  Other Topics Concern  . Not on file  Social History Narrative  . Not on file    Family History  Problem Relation Age of Onset  . Heart disease Mother   . Cancer Mother        ? type  . Diabetes Father   . Hyperlipidemia Father   . Hypertension Father   . Stomach cancer Father   . Heart disease Brother 73       X 5 brothers    Review of Systems:  As stated in the HPI and otherwise negative.   BP 114/66   Pulse (!) 55   Ht 5\' 6"  (1.676 m)   Wt 195 lb 1.9 oz (88.5 kg)   SpO2 95%   BMI 31.49 kg/m   Physical Examination:  General: Well developed, well nourished, NAD  HEENT: OP clear, mucus membranes moist  SKIN: warm, dry. No rashes. Neuro: No focal deficits  Musculoskeletal: Muscle strength 5/5 all ext  Psychiatric: Mood and affect normal  Neck: No JVD, no carotid bruits, no thyromegaly, no lymphadenopathy.  Lungs:Clear bilaterally, no wheezes,  rhonci, crackles Cardiovascular: Regular rate and rhythm. Systolic murmur.  Abdomen:Soft. Bowel sounds present. Non-tender.  Extremities: No lower extremity edema. Pulses are 2 + in the bilateral DP/PT.  Cardiac cath July 2016: Left Anterior Descending  The vessel is large .   Marland Kitchen Ost LAD to Prox LAD lesion, 100% stenosed. chronic total occlusion .      Ramus Intermedius  The vessel is moderate in size .   Marland Kitchen Ramus lesion, 50% stenosed. diffuse .     Left Circumflex   . Second Obtuse Marginal Branch   The vessel is small in size.   Suezanne Jacquet 2nd Mrg lesion, 80% stenosed. discrete . Very small caliber vessel.   . Third Obtuse Marginal Branch   The vessel is small in size.     Right Coronary Artery   . Prox RCA lesion, 30% stenosed. diffuse .   Marland Kitchen Dist RCA lesion, 90% stenosed. discrete .   Marland Kitchen Right Posterior Descending Artery   The vessel is small in size.   . Right Posterior Atrioventricular Branch   The vessel is small in size.     Graft Angiography    Free LIMA Graft to Mid LAD  LIMA was injected is normal in caliber, and is anatomically normal.     Free Graft to 1st Diag  SVG was injected . There is severe disease in the graft. Graft is chronically occluded.   . Origin to Prox Graft lesion, 100% stenosed. chronic total occlusion .     Free Graft to 2nd Mrg  SVG was injected . There is severe disease in the graft. The graft is chronically occluded.   Marland Kitchen  Origin lesion, 100% stenosed. chronic total occlusion .     Free Graft to RPDA  SVG was injected . There is severe disease in the graft. The graft is occluded.   . Origin lesion, 100% stenosed. chronic total occlusion .       Coronary Diagrams   Diagnostic Diagram          Echo 12/30/17: Left ventricle: The cavity size was normal. Wall thickness was   normal. Systolic function was normal. The estimated ejection   fraction was in the range of 55% to 60%. Wall motion was normal;   there were no regional wall  motion abnormalities. Left   ventricular diastolic function parameters were normal. - Aortic valve: Valve area (VTI): 2.55 cm^2. Valve area (Vmax): 2.5   cm^2. Valve area (Vmean): 1.85 cm^2. - Mitral valve: There was mild regurgitation. The MR is eccentric   and posteriorally direceted, may be underestimated. The MV/AV VTI   ratio is 1.1, suggesting mild to moderate MR Valve area by   continuity equation (using LVOT flow): 2.27 cm^2.  EKG:  EKG is not  ordered today. The ekg ordered today demonstrates   Recent Labs: 12/25/2017: ALT 28; BUN 18; Creatinine, Ser 1.22; Potassium 4.4; Sodium 140   Lipid Panel    Component Value Date/Time   CHOL 146 12/25/2017 0905   TRIG 169 (H) 12/25/2017 0905   HDL 42 12/25/2017 0905   CHOLHDL 3.5 12/25/2017 0905   CHOLHDL 3.9 02/22/2016 0844   VLDL 40 (H) 02/22/2016 0844   LDLCALC 70 12/25/2017 0905     Wt Readings from Last 3 Encounters:  09/04/18 195 lb 1.9 oz (88.5 kg)  07/01/18 192 lb (87.1 kg)  12/25/17 194 lb 1.9 oz (88.1 kg)     Other studies Reviewed: Additional studies/ records that were reviewed today include: . Review of the above records demonstrates:    Assessment and Plan:   1. CAD with stable angina: His angina is stable. No change in character or frequency of chest pain. Continue ASA, Plavix, statin, beta blocker and Ranexa.     2. HTN: BP is well controlled. No changes  3. Hyperlipidemia: Lipids well controlled. Continue statin.   4. Mitral regurgitation: Mild to moderate by echo 2019  5. Chronic diastolic CHF: No evidence of volume overload on exam. Weight is stable. Continue Lasix.   Current medicines are reviewed at length with the patient today.  The patient does not have concerns regarding medicines.  The following changes have been made:  no change  Labs/ tests ordered today include:   No orders of the defined types were placed in this encounter.   Disposition:   FU with me in 6  months  Signed, Verne Carrow, MD 09/04/2018 9:23 AM    [facility] [address] [place], [place], [place]  [address] Phone: [phone]; Fax: [phone]

## 2018-09-04 ENCOUNTER — Encounter: Payer: Self-pay | Admitting: Cardiovascular Disease

## 2018-09-04 ENCOUNTER — Ambulatory Visit (INDEPENDENT_AMBULATORY_CARE_PROVIDER_SITE_OTHER): Payer: Medicare Other | Admitting: Cardiovascular Disease

## 2018-09-04 VITALS — BP 114/66 | HR 55 | Ht 66.0 in | Wt 195.1 lb

## 2018-09-04 DIAGNOSIS — E7800 Pure hypercholesterolemia, unspecified: Secondary | ICD-10-CM

## 2018-09-04 DIAGNOSIS — I340 Nonrheumatic mitral (valve) insufficiency: Secondary | ICD-10-CM | POA: Diagnosis not present

## 2018-09-04 DIAGNOSIS — I5032 Chronic diastolic (congestive) heart failure: Secondary | ICD-10-CM

## 2018-09-04 DIAGNOSIS — I25118 Atherosclerotic heart disease of native coronary artery with other forms of angina pectoris: Secondary | ICD-10-CM

## 2018-09-04 DIAGNOSIS — I10 Essential (primary) hypertension: Secondary | ICD-10-CM

## 2018-09-04 MED ORDER — POTASSIUM CHLORIDE CRYS ER 20 MEQ PO TBCR: 20.0000 meq | EXTENDED_RELEASE_TABLET | Freq: Every day | ORAL | 3 refills | Status: DC

## 2018-09-04 MED ORDER — METOPROLOL SUCCINATE ER 25 MG PO TB24: ORAL_TABLET | ORAL | 3 refills | Status: DC

## 2018-09-04 MED ORDER — FUROSEMIDE 20 MG PO TABS: 20.0000 mg | ORAL_TABLET | Freq: Every day | ORAL | 3 refills | Status: DC

## 2018-09-04 MED ORDER — PANTOPRAZOLE SODIUM 40 MG PO TBEC: DELAYED_RELEASE_TABLET | ORAL | 3 refills | Status: DC

## 2018-09-04 MED ORDER — RANOLAZINE ER 500 MG PO TB12: 500.0000 mg | ORAL_TABLET | Freq: Two times a day (BID) | ORAL | 3 refills | Status: DC

## 2018-09-04 MED ORDER — PRAVASTATIN SODIUM 40 MG PO TABS: 40.0000 mg | ORAL_TABLET | Freq: Every day | ORAL | 3 refills | Status: DC

## 2018-09-04 MED ORDER — CLOPIDOGREL BISULFATE 75 MG PO TABS: 75.0000 mg | ORAL_TABLET | Freq: Every day | ORAL | 3 refills | Status: DC

## 2018-09-04 NOTE — Patient Instructions (Signed)

## 2018-10-01 ENCOUNTER — Encounter: Payer: Self-pay | Admitting: Family Medicine

## 2019-01-13 DIAGNOSIS — H5200 Hypermetropia, unspecified eye: Secondary | ICD-10-CM | POA: Diagnosis not present

## 2019-01-13 DIAGNOSIS — Z0100 Encounter for examination of eyes and vision without abnormal findings: Secondary | ICD-10-CM | POA: Diagnosis not present

## 2019-01-13 DIAGNOSIS — H2513 Age-related nuclear cataract, bilateral: Secondary | ICD-10-CM | POA: Diagnosis not present

## 2019-02-17 ENCOUNTER — Telehealth: Payer: Self-pay

## 2019-02-17 NOTE — Telephone Encounter (Signed)
   Cardiac Questionnaire:    Since your last visit or hospitalization:    1. Have you been having new or worsening chest pain? NO   2. Have you been having new or worsening shortness of breath? NO 3. Have you been having new or worsening leg swelling, wt gain, or increase in abdominal girth (pants fitting more tightly)? NO   4. Have you had any passing out spells? NO  *A YES to any of these questions would result in the appointment being kept. *If all the answers to these questions are NO, we should indicate that given the current situation regarding the worldwide coronarvirus pandemic, at the recommendation of the CDC, we are looking to limit gatherings in our waiting area, and thus will reschedule their appointment beyond four weeks from today.   _____________   Pt called and agreeable to postpone appt. No acute cardiac symptoms at this time. Advised pt to call back with any new issues. Pt verbalized understanding. Will route to cancel pool

## 2019-03-05 ENCOUNTER — Ambulatory Visit: Payer: Self-pay | Admitting: Cardiovascular Disease

## 2019-03-23 ENCOUNTER — Telehealth: Payer: Self-pay

## 2019-03-23 NOTE — Telephone Encounter (Signed)
Called pt to set up possible evisit 03/24/2019 with ML. Left message asking pt to call the office. If pt returns call please take down a number that is best to reach them at as well as a time. Thank you.

## 2019-03-23 NOTE — Telephone Encounter (Signed)
Called pt to set up possible evisit. Left message asking pt to call the office.  

## 2019-03-24 NOTE — Telephone Encounter (Signed)
Called pt to set up possible evisit. Pt refused virtual visit and prefers an OV and only with Dr. Clifton James. Informed pt we will not be seeing pt in the office until August. Pt agreed to wait til then.

## 2019-03-24 NOTE — Telephone Encounter (Signed)
Patient returned your call.

## 2019-06-24 ENCOUNTER — Other Ambulatory Visit: Payer: Self-pay | Admitting: *Deleted

## 2019-06-24 MED ORDER — CITALOPRAM HYDROBROMIDE 40 MG PO TABS: 40.0000 mg | ORAL_TABLET | Freq: Every day | ORAL | 3 refills | Status: DC

## 2019-06-24 NOTE — Telephone Encounter (Signed)
Pt has 2 days worth. Frank [name], CMA

## 2019-07-31 DIAGNOSIS — R69 Illness, unspecified: Secondary | ICD-10-CM | POA: Diagnosis not present

## 2019-08-05 ENCOUNTER — Ambulatory Visit (INDEPENDENT_AMBULATORY_CARE_PROVIDER_SITE_OTHER): Payer: Medicare HMO | Admitting: Cardiovascular Disease

## 2019-08-05 ENCOUNTER — Other Ambulatory Visit: Payer: Self-pay

## 2019-08-05 VITALS — BP 130/68 | HR 66 | Ht 66.0 in | Wt 193.0 lb

## 2019-08-05 DIAGNOSIS — I5032 Chronic diastolic (congestive) heart failure: Secondary | ICD-10-CM | POA: Diagnosis not present

## 2019-08-05 DIAGNOSIS — E7800 Pure hypercholesterolemia, unspecified: Secondary | ICD-10-CM

## 2019-08-05 DIAGNOSIS — I25118 Atherosclerotic heart disease of native coronary artery with other forms of angina pectoris: Secondary | ICD-10-CM

## 2019-08-05 DIAGNOSIS — I10 Essential (primary) hypertension: Secondary | ICD-10-CM | POA: Diagnosis not present

## 2019-08-05 DIAGNOSIS — I340 Nonrheumatic mitral (valve) insufficiency: Secondary | ICD-10-CM | POA: Diagnosis not present

## 2019-08-05 MED ORDER — POTASSIUM CHLORIDE CRYS ER 20 MEQ PO TBCR: 20.0000 meq | EXTENDED_RELEASE_TABLET | Freq: Every day | ORAL | 3 refills | Status: DC

## 2019-08-05 MED ORDER — PANTOPRAZOLE SODIUM 40 MG PO TBEC: DELAYED_RELEASE_TABLET | ORAL | 3 refills | Status: DC

## 2019-08-05 MED ORDER — FUROSEMIDE 20 MG PO TABS: 20.0000 mg | ORAL_TABLET | Freq: Every day | ORAL | 3 refills | Status: DC

## 2019-08-05 MED ORDER — METOPROLOL SUCCINATE ER 25 MG PO TB24: ORAL_TABLET | ORAL | 3 refills | Status: DC

## 2019-08-05 MED ORDER — PRAVASTATIN SODIUM 40 MG PO TABS: 40.0000 mg | ORAL_TABLET | Freq: Every day | ORAL | 3 refills | Status: DC

## 2019-08-05 MED ORDER — RANOLAZINE ER 500 MG PO TB12: 500.0000 mg | ORAL_TABLET | Freq: Two times a day (BID) | ORAL | 3 refills | Status: DC

## 2019-08-05 MED ORDER — CLOPIDOGREL BISULFATE 75 MG PO TABS: 75.0000 mg | ORAL_TABLET | Freq: Every day | ORAL | 3 refills | Status: DC

## 2019-08-05 NOTE — Progress Notes (Signed)
Chief Complaint  Patient presents with  . Follow-up    CAD     History of Present Illness: 63 yo male with a history of CAD s/p CABG 2004 with subsequent PCI, HLD, HTN and mitral regurgitation who is here today for cardiac follow up. He underwent 5V CABG in 2004. 2 drug eluting stents were placed in the SVG to to PDA in July 2013. At that time the LIMA to LAD was patent but the vein grafts to the OM, PLA and Diagonal were occluded. Repeat cath in December 2014 with recurrent severe disease in the SVG to PDA treated with 2 drug eluting stents. Echo August 2015 with normal LV function, mild MR. He was seen here in July 2016 with recurrent chest pain. Cardiac cath July 2016 with new occlusion of SVG to PDA. The only patent graft was the LIMA to LAD. Diffuse disease in native vessels. No targets for PCI. Ranexa was started. Normal ABI June 2018. Echo 12/30/17 with LVEF=55-60%, mild to moderate MR.   He is here today for follow up. The patient denies any dyspnea, palpitations, lower extremity edema, orthopnea, PND, dizziness, near syncope or syncope. Rare chest pains but no change in frequency or severity.    Primary Care Physician: [facility] ( [facility], [place], [place])   Past Medical History:  Diagnosis Date  . Anxiety   . Arthritis    hands, shoulders, arms  . CAD (coronary artery disease)    5V CABG in 2004; stent to RCA x1 and SVG to acute marginal x2 2011;  PTCA/DES x 2 body of SVG to PDA  05/20/12  . CHF (congestive heart failure) ([place])     EF 55% by echo 03/27/2012  . Chronic lower back pain    "w/activity"  . Depression   . GERD (gastroesophageal reflux disease)   . HLD (hyperlipidemia)   . Hypertension   . Low HDL (under 40)   . Mitral regurgitation    Moderate by TEE 03/27/12  . Myocardial infarction ([place]) 2004  . Numbness and tingling in hands   . Numbness and tingling of both legs   . Umbilical hernia    unrepaired (05/20/12)    Past Surgical History:   Procedure Laterality Date  . CARDIAC CATHETERIZATION N/A 05/27/2015   Procedure: Left Heart Cath and Coronary Angiography;  Surgeon: Burnell Blanks, MD;  Location: [facility];  Service: Cardiovascular;  Laterality: N/A;  . CORONARY ANGIOPLASTY WITH STENT PLACEMENT  05/11   stent to RCA x 1 and SVG-acute marginal x2. EF normal  . CORONARY ANGIOPLASTY WITH STENT PLACEMENT  05/20/12   PTCA/DES x 2 body of SVG to PDA   . CORONARY ARTERY BYPASS GRAFT  2004   5v CABG   . INCISION AND DRAINAGE OF WOUND  2010   "from bite; maybe snake or spider; almost lost LLE"  . LEFT HEART CATHETERIZATION WITH CORONARY/GRAFT ANGIOGRAM N/A 05/20/2012   Procedure: LEFT HEART CATHETERIZATION WITH Beatrix Fetters;  Surgeon: Burnell Blanks, MD;  Location: [facility] CATH [facility];  Service: Cardiovascular;  Laterality: N/A;  . LEFT HEART CATHETERIZATION WITH CORONARY/GRAFT ANGIOGRAM N/A 10/30/2013   Procedure: LEFT HEART CATHETERIZATION WITH Beatrix Fetters;  Surgeon: Burnell Blanks, MD;  Location: [facility] CATH LAB;  Service: Cardiovascular;  Laterality: N/A;  . PERCUTANEOUS CORONARY STENT INTERVENTION (PCI-S)  10/30/2013   Procedure: PERCUTANEOUS CORONARY STENT INTERVENTION (PCI-S);  Surgeon: Burnell Blanks, MD;  Location: [facility] CATH LAB;  Service: Cardiovascular;;  .  PTCA  10/30/2013   DES  SVG    . TEE WITHOUT CARDIOVERSION  03/27/2012   Procedure: TRANSESOPHAGEAL ECHOCARDIOGRAM (TEE);  Surgeon: Laurey Moralealton S McLean, MD;  [facility];  Service: Cardiovascular;  Laterality: N/A;  to be done at 1100    Current Outpatient Medications  Medication Sig Dispense Refill  . aspirin EC 81 MG tablet Take 81 mg by mouth daily.    . citalopram (CELEXA) 40 MG tablet Take 1 tablet (40 mg total) by mouth daily. 90 tablet 3  . clopidogrel (PLAVIX) 75 MG tablet Take 1 tablet (75 mg total) by mouth daily. 90 tablet 3  . cyclobenzaprine (FLEXERIL) 10 MG tablet Take 1 tablet (10 mg total) by mouth  3 (three) times daily as needed for muscle spasms. 90 tablet 0  . fluticasone (FLONASE) 50 MCG/ACT nasal spray Place 2 sprays into both nostrils daily as needed for allergies or rhinitis.    . furosemide (LASIX) 20 MG tablet Take 1 tablet (20 mg total) by mouth daily. 90 tablet 3  . gabapentin (NEURONTIN) 100 MG capsule Take 1 capsule by mouth daily.    . metoprolol succinate (TOPROL-XL) 25 MG 24 hr tablet TAKE ONE TABLET BY MOUTH DAILY WITH OR IMMEDIATELY FOLLOWING A MEAL. 90 tablet 3  . Multiple Vitamin (MULTIVITAMIN WITH MINERALS) TABS tablet Take 1 tablet by mouth daily.    . nitroGLYCERIN (NITROSTAT) 0.4 MG SL tablet DISSOLVE 1 TABLET UNDER TONGUE EVERY 5 MINUTES UP TO 15 MIN FOR CHESTPAIN. IF NO RELIEF CALL 911. 25 tablet 6  . pantoprazole (PROTONIX) 40 MG tablet TAKE 1 TABLET BY MOUTH ONCE DAILY AT 6:00 AM. 90 tablet 3  . potassium chloride SA (K-DUR) 20 MEQ tablet Take 1 tablet (20 mEq total) by mouth daily. 90 tablet 3  . pravastatin (PRAVACHOL) 40 MG tablet Take 1 tablet (40 mg total) by mouth daily. 90 tablet 3  . ranolazine (RANEXA) 500 MG 12 hr tablet Take 1 tablet (500 mg total) by mouth 2 (two) times daily. 180 tablet 3  . tetrahydrozoline-zinc (VISINE-AC) 0.05-0.25 % ophthalmic solution Place 2 drops into both eyes 3 (three) times daily as needed (itching/burning).     No current facility-administered medications for this visit.     Allergies  Allergen Reactions  . Sulfa Antibiotics Rash  . Hydrocodone Hives and Itching    Social History   Socioeconomic History  . Marital status: Married    Spouse name: Not on file  . Number of children: 3  . Years of education: Not on file  . Highest education level: Not on file  Occupational History  . Occupation: Unemployed [employer]  Social Needs  . Financial resource strain: Not on file  . Food insecurity    Worry: Not on file    Inability: Not on file  . Transportation needs    Medical: Not on file    Non-medical: Not on file   Tobacco Use  . Smoking status: Former Smoker    Packs/day: 3.00    Years: 40.00    Pack years: 120.00    Types: Cigarettes    Quit date: 01/23/2003    Years since quitting: 16.5  . Smokeless tobacco: Never Used  Substance and Sexual Activity  . 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 Drug use: No  . Sexual activity: Yes  Lifestyle  . Physical activity    Days per week: Not on  file    Minutes per session: Not on file  . Stress: Not on file  Relationships  . Social Musician on phone: Not on file    Gets together: Not on file    Attends religious service: Not on file    Active member of club or organization: Not on file    Attends meetings of clubs or organizations: Not on file    Relationship status: Not on file  . Intimate partner violence    Fear of current or ex partner: Not on file    Emotionally abused: Not on file    Physically abused: Not on file    Forced sexual activity: Not on file  Other Topics Concern  . Not on file  Social History Narrative  . Not on file    Family History  Problem Relation Age of Onset  . Heart disease Mother   . Cancer Mother        ? type  . Diabetes Father   . Hyperlipidemia Father   . Hypertension Father   . Stomach cancer Father   . Heart disease Brother 1       X 5 brothers    Review of Systems:  As stated in the HPI and otherwise negative.   BP 130/68   Pulse 66   Ht 5\' 6"  (1.676 m)   Wt 193 lb (87.5 kg)   SpO2 98%   BMI 31.15 kg/m   Physical Examination:  General: Well developed, well nourished, NAD  HEENT: OP clear, mucus membranes moist  SKIN: warm, dry. No rashes. Neuro: No focal deficits  Musculoskeletal: Muscle strength 5/5 all ext  Psychiatric: Mood and affect normal  Neck: No JVD, no carotid bruits, no thyromegaly, no lymphadenopathy.  Lungs:Clear bilaterally, no wheezes, rhonci, crackles Cardiovascular: Regular rate and rhythm.  No murmurs, gallops or rubs. Abdomen:Soft. Bowel sounds present. Non-tender.  Extremities: No lower extremity edema. Pulses are 2 + in the bilateral DP/PT.  Cardiac cath July 2016: Left Anterior Descending  The vessel is large .   Marland Kitchen Ost LAD to Prox LAD lesion, 100% stenosed. chronic total occlusion .      Ramus Intermedius  The vessel is moderate in size .   Marland Kitchen Ramus lesion, 50% stenosed. diffuse .     Left Circumflex   . Second Obtuse Marginal Branch   The vessel is small in size.   Suezanne Jacquet 2nd Mrg lesion, 80% stenosed. discrete . Very small caliber vessel.   . Third Obtuse Marginal Branch   The vessel is small in size.     Right Coronary Artery   . Prox RCA lesion, 30% stenosed. diffuse .   Marland Kitchen Dist RCA lesion, 90% stenosed. discrete .   Marland Kitchen Right Posterior Descending Artery   The vessel is small in size.   . Right Posterior Atrioventricular Branch   The vessel is small in size.     Graft Angiography    Free LIMA Graft to Mid LAD  LIMA was injected is normal in caliber, and is anatomically normal.     Free Graft to 1st Diag  SVG was injected . There is severe disease in the graft. Graft is chronically occluded.   . Origin to Prox Graft lesion, 100% stenosed. chronic total occlusion .     Free Graft to 2nd Mrg  SVG was injected . There is severe disease in the graft. The graft is chronically occluded.   . Origin lesion, 100%  stenosed. chronic total occlusion .     Free Graft to RPDA  SVG was injected . There is severe disease in the graft. The graft is occluded.   . Origin lesion, 100% stenosed. chronic total occlusion .       Coronary Diagrams   Diagnostic Diagram          Echo 12/30/17: Left ventricle: The cavity size was normal. Wall thickness was   normal. Systolic function was normal. The estimated ejection   fraction was in the range of 55% to 60%. Wall motion was normal;   there were no regional wall motion abnormalities. Left   ventricular  diastolic function parameters were normal. - Aortic valve: Valve area (VTI): 2.55 cm^2. Valve area (Vmax): 2.5   cm^2. Valve area (Vmean): 1.85 cm^2. - Mitral valve: There was mild regurgitation. The MR is eccentric   and posteriorally direceted, may be underestimated. The MV/AV VTI   ratio is 1.1, suggesting mild to moderate MR Valve area by   continuity equation (using LVOT flow): 2.27 cm^2.  EKG:  EKG is ordered today. The ekg ordered today demonstrates NSR, rate 68 bpm. Inferior Q waves  Recent Labs: No results found for requested labs within last 8760 hours.   Lipid Panel    Component Value Date/Time   CHOL 146 12/25/2017 0905   TRIG 169 (H) 12/25/2017 0905   HDL 42 12/25/2017 0905   CHOLHDL 3.5 12/25/2017 0905   CHOLHDL 3.9 02/22/2016 0844   VLDL 40 (H) 02/22/2016 0844   LDLCALC 70 12/25/2017 0905     Wt Readings from Last 3 Encounters:  08/05/19 193 lb (87.5 kg)  09/04/18 195 lb 1.9 oz (88.5 kg)  07/01/18 192 lb (87.1 kg)     Other studies Reviewed: Additional studies/ records that were reviewed today include: . Review of the above records demonstrates:    Assessment and Plan:   1. CAD with stable angina: Stable chest pains c/w his chronic angina. Will continue Ranexa, ASA, Plavix, statin and beta blocker.     2. HTN: BP is controlled. No changes  3. Hyperlipidemia: LDL at goal in 2019. Continue statin. Will check lipids and LFTs now  4. Mitral regurgitation: Mild to moderate by echo 2019. Will repeat echo in 2021.   5. Chronic diastolic CHF: Weight is stable. No evidence of volume overload. Continue Lasix  Current medicines are reviewed at length with the patient today.  The patient does not have concerns regarding medicines.  The following changes have been made:  no change  Labs/ tests ordered today include:   Orders Placed This Encounter  Procedures  . Lipid panel  . Basic metabolic panel  . Hepatic function panel  . EKG 12-Lead     Disposition:   FU with me in 6 months  Signed, Verne Carrowhristopher , MD 08/05/2019 3:19 PM    [facility] [address], [place], [place]  [address] Phone: [phone]; Fax: [phone]

## 2019-08-05 NOTE — Patient Instructions (Signed)
Medication Instructions:  Your physician recommends that you continue on your current medications as directed. Please refer to the Current Medication list given to you today.  If you need a refill on your cardiac medications before your next appointment, please call your pharmacy.   Lab work: Your physician recommends that you return for lab work in the next 1-2 weeks when you are fasting (nothing to eat or drink after midnight except water and black coffee).  If you have labs (blood work) drawn today and your tests are completely normal, you will receive your results only by: Marland Kitchen MyChart Message (if you have MyChart) OR . A paper copy in the mail If you have any lab test that is abnormal or we need to change your treatment, we will call you to review the results.  Testing/Procedures: None  Follow-Up: At [facility], you and your health needs are our priority.  As part of our continuing mission to provide you with exceptional heart care, we have created designated Provider Care Teams.  These Care Teams include your primary Cardiologist (physician) and Advanced Practice Providers (APPs -  Physician Assistants and Nurse Practitioners) who all work together to provide you with the care you need, when you need it. You will need a follow up appointment in 6 months.  Please call our office 2 months in advance to schedule this appointment.  You may see Lauree Chandler, MD or one of the following Advanced Practice Providers on your designated Care Team:   [place], PA-C Melina Copa, PA-C . Ermalinda Barrios, PA-C  Any Other Special Instructions Will Be Listed Below (If Applicable).

## 2019-08-19 ENCOUNTER — Other Ambulatory Visit: Payer: Self-pay

## 2019-08-19 ENCOUNTER — Other Ambulatory Visit: Payer: Medicare HMO | Admitting: *Deleted

## 2019-08-19 DIAGNOSIS — I10 Essential (primary) hypertension: Secondary | ICD-10-CM | POA: Diagnosis not present

## 2019-08-19 DIAGNOSIS — I25118 Atherosclerotic heart disease of native coronary artery with other forms of angina pectoris: Secondary | ICD-10-CM | POA: Diagnosis not present

## 2019-08-19 DIAGNOSIS — I5032 Chronic diastolic (congestive) heart failure: Secondary | ICD-10-CM | POA: Diagnosis not present

## 2019-08-19 DIAGNOSIS — E7800 Pure hypercholesterolemia, unspecified: Secondary | ICD-10-CM

## 2019-08-19 LAB — HEPATIC FUNCTION PANEL
ALT: 23 IU/L (ref 0–44)
AST: 19 IU/L (ref 0–40)
Albumin: 4.6 g/dL (ref 3.8–4.8)
Alkaline Phosphatase: 119 IU/L — ABNORMAL HIGH (ref 39–117)
Bilirubin Total: 0.4 mg/dL (ref 0.0–1.2)
Bilirubin, Direct: 0.14 mg/dL (ref 0.00–0.40)
Total Protein: 7 g/dL (ref 6.0–8.5)

## 2019-08-19 LAB — BASIC METABOLIC PANEL
BUN/Creatinine Ratio: 10 (ref 10–24)
BUN: 12 mg/dL (ref 8–27)
CO2: 22 mmol/L (ref 20–29)
Calcium: 9.6 mg/dL (ref 8.6–10.2)
Chloride: 104 mmol/L (ref 96–106)
Creatinine, Ser: 1.2 mg/dL (ref 0.76–1.27)
GFR calc Af Amer: 73 mL/min/{1.73_m2} (ref >59)
GFR calc non Af Amer: 64 mL/min/{1.73_m2} (ref >59)
Glucose: 106 mg/dL — ABNORMAL HIGH (ref 65–99)
Potassium: 4.3 mmol/L (ref 3.5–5.2)
Sodium: 140 mmol/L (ref 134–144)

## 2019-08-19 LAB — LIPID PANEL
Chol/HDL Ratio: 4 ratio (ref 0.0–5.0)
Cholesterol, Total: 166 mg/dL (ref 100–199)
HDL: 42 mg/dL (ref >39)
LDL Chol Calc (NIH): 91 mg/dL (ref 0–99)
Triglycerides: 195 mg/dL — ABNORMAL HIGH (ref 0–149)
VLDL Cholesterol Cal: 33 mg/dL (ref 5–40)

## 2019-08-26 ENCOUNTER — Telehealth: Payer: Self-pay | Admitting: *Deleted

## 2019-08-26 DIAGNOSIS — E7800 Pure hypercholesterolemia, unspecified: Secondary | ICD-10-CM

## 2019-08-26 MED ORDER — PRAVASTATIN SODIUM 80 MG PO TABS: 80.0000 mg | ORAL_TABLET | Freq: Every evening | ORAL | 3 refills | Status: DC

## 2019-08-26 NOTE — Telephone Encounter (Signed)
Spoke with patient's wife (DPR) and informed of results review and recommendations by Dr. Angelena Form.  Pt is taking pravastatin 40 mg daily and so will increase to 80 mg daily and go into lab in [place] 12/30 for repeat lipids and lfp. New prescription sent to pharmacy for 80 mg pravachol.

## 2019-08-26 NOTE — Telephone Encounter (Signed)
-----   Message from Burnell Blanks, MD sent at 08/20/2019 12:46 PM EDT ----- His LDL is up from 70 to 91 since last check. Can we make sure he is taking his pravastatin daily? If he is, I would recommend increasing Pravastatin to 80 mg daily. REpeat lipids and LFTS in 12 weeks. Thanks, chris

## 2019-10-07 ENCOUNTER — Encounter: Payer: Self-pay | Admitting: Family Medicine

## 2019-10-07 ENCOUNTER — Ambulatory Visit (INDEPENDENT_AMBULATORY_CARE_PROVIDER_SITE_OTHER): Payer: Medicare HMO | Admitting: Family Medicine

## 2019-10-07 ENCOUNTER — Other Ambulatory Visit: Payer: Self-pay

## 2019-10-07 VITALS — BP 122/64 | HR 60 | Wt 196.2 lb

## 2019-10-07 DIAGNOSIS — I10 Essential (primary) hypertension: Secondary | ICD-10-CM | POA: Diagnosis not present

## 2019-10-07 DIAGNOSIS — E785 Hyperlipidemia, unspecified: Secondary | ICD-10-CM

## 2019-10-07 NOTE — Patient Instructions (Signed)
It was great to see you today! Thank you for letting me participate in your care!  Today, we discussed your overall health. I am glad you got the Cologuard and it was negative. Please continue taking the higher dose statin and make sure you get repeat labs in [place] on 12/30.   Please return after your 64th birthday to receive your Pneumona vaccine.  Be well, Harolyn Rutherford, DO PGY-3, [facility]

## 2019-10-07 NOTE — Assessment & Plan Note (Signed)
Well controlled on current regimen. No changes. - Cont Lasix 20mg  daily - Cont metoprolol 25mg  XR daily

## 2019-10-07 NOTE — Assessment & Plan Note (Signed)
High dose statin recommend given history of CAD. LDL recently went up on 40mg  of pravastatin, increased to 80mg  by Cardiologist. No muscle aches - Agree with increase and recheck of lipid panel in December - Cont Pravastatin 80mg  daily

## 2019-10-07 NOTE — Progress Notes (Signed)
     Subjective: Chief Complaint  Patient presents with  . Annual Exam    HPI: Frank [name] is a 63 y.o. presenting to clinic today to discuss the following:  HTN Frank [name] presents today for an annual exam. He has a PMH of CAD, angina, and HFpEF, mitral regurgitation, HTN, GERD, and HLD. He is taking Lasix and metoprolol and has his heart medication managed by his Cardiologist. He was there recently and they recommended he continue his current BP regimen. No unusual chest pain, infrequent sharp pains that he gets that is unchanged and consistent with his typical angina.  HLD Patient had lipid panel checked recently and was found to have increase in LDL from 70 to 91 on Pravastatin 40mg . His Cardiologist increased it to 80mg  and has him scheduled for repeat labs in December. Agree with management.  CHF Patient has no new SOB or difficulty breathing. He is scheduled for a repeat echo in 2021.  Health Maintenance: none. He did have a question about whether he should get the Pneumovac23 and as he is not yet 64 it is not recommended for him at this time.     ROS noted in HPI.    Social History   Tobacco Use  Smoking Status Former Smoker  . Packs/day: 3.00  . Years: 40.00  . Pack years: 120.00  . Types: Cigarettes  . Quit date: 01/23/2003  . Years since quitting: 16.7  Smokeless Tobacco Never Used    Objective: BP 122/64   Pulse 60   Wt 196 lb 3.2 oz (89 kg)   SpO2 97%   BMI 31.67 kg/m  Vitals and nursing notes reviewed  Physical Exam Gen: Alert and Oriented x 3, NAD CV: RRR, no murmurs, normal S1, S2 split Resp: CTAB, no wheezing, rales, or rhonchi, comfortable work of breathing Ext: no clubbing, cyanosis, or edema Skin: warm, dry, intact, no rashes   Assessment/Plan:  Essential hypertension, benign Well controlled on current regimen. No changes. - Cont Lasix 20mg  daily - Cont metoprolol 25mg  XR daily  Dyslipidemia High dose statin recommend given history of  CAD. LDL recently went up on 40mg  of pravastatin, increased to 80mg  by Cardiologist. No muscle aches - Agree with increase and recheck of lipid panel in December - Cont Pravastatin 80mg  daily   PATIENT EDUCATION PROVIDED: See AVS    Diagnosis and plan along with any newly prescribed medication(s) were discussed in detail with this patient today. The patient verbalized understanding and agreed with the plan. Patient advised if symptoms worsen return to clinic or ER.    Harolyn Rutherford, DO 10/07/2019, 8:37 AM PGY-3 [place]

## 2019-10-09 ENCOUNTER — Telehealth: Payer: Self-pay | Admitting: *Deleted

## 2019-10-09 NOTE — Telephone Encounter (Signed)
Spoke with pt wife and she scheduled him an appt. Deseree Kennon Holter, CMA

## 2019-10-09 NOTE — Telephone Encounter (Signed)
-----   Message from Nuala Alpha, DO sent at 10/07/2019 11:38 AM EST ----- Regarding: Pneumovac23 Can we please call patient and let him know that actually due to his heart failure he would qualify for early Pneumonia vaccine. We can give him a script to get it at his pharmacy or order it and give it to him here in clinic, whatever he prefers. Thanks!  Tim

## 2019-10-23 ENCOUNTER — Telehealth: Payer: Self-pay | Admitting: Cardiovascular Disease

## 2019-10-23 ENCOUNTER — Ambulatory Visit: Payer: Medicare HMO

## 2019-10-23 NOTE — Telephone Encounter (Signed)
°*  STAT* If patient is at the pharmacy, call can be transferred to refill team.   1. Which medications need to be refilled? (please list name of each medication and dose if known) Ranexa 500 mg  2. Which pharmacy/location (including street and city if local pharmacy) is medication to be sent to? [facility]   3. Do they need a 30 day or 90 day supply? Not sure but the patient is out.

## 2019-10-23 NOTE — Telephone Encounter (Signed)
Called pt pharmacy and they had a Rx on file and getting pt's medication ready for pt to pick up. I called pt to inform him of this as well. I advised pt the if he has any other problems, questions or concerns, to please give our office a call back. Pt verbalized understanding.

## 2020-03-16 ENCOUNTER — Other Ambulatory Visit: Payer: Self-pay

## 2020-03-16 ENCOUNTER — Encounter: Payer: Self-pay | Admitting: Cardiovascular Disease

## 2020-03-16 ENCOUNTER — Ambulatory Visit (INDEPENDENT_AMBULATORY_CARE_PROVIDER_SITE_OTHER): Payer: Medicare HMO | Admitting: Cardiovascular Disease

## 2020-03-16 VITALS — BP 116/60 | HR 68 | Ht 66.0 in | Wt 192.1 lb

## 2020-03-16 DIAGNOSIS — I25118 Atherosclerotic heart disease of native coronary artery with other forms of angina pectoris: Secondary | ICD-10-CM | POA: Diagnosis not present

## 2020-03-16 DIAGNOSIS — E7800 Pure hypercholesterolemia, unspecified: Secondary | ICD-10-CM

## 2020-03-16 DIAGNOSIS — I5032 Chronic diastolic (congestive) heart failure: Secondary | ICD-10-CM | POA: Diagnosis not present

## 2020-03-16 DIAGNOSIS — I340 Nonrheumatic mitral (valve) insufficiency: Secondary | ICD-10-CM | POA: Diagnosis not present

## 2020-03-16 DIAGNOSIS — I10 Essential (primary) hypertension: Secondary | ICD-10-CM

## 2020-03-16 LAB — HEPATIC FUNCTION PANEL
ALT: 19 IU/L (ref 0–44)
AST: 23 IU/L (ref 0–40)
Albumin: 4.6 g/dL (ref 3.8–4.8)
Alkaline Phosphatase: 112 IU/L (ref 39–117)
Bilirubin Total: 0.5 mg/dL (ref 0.0–1.2)
Bilirubin, Direct: 0.18 mg/dL (ref 0.00–0.40)
Total Protein: 6.7 g/dL (ref 6.0–8.5)

## 2020-03-16 LAB — LIPID PANEL
Chol/HDL Ratio: 4 ratio (ref 0.0–5.0)
Cholesterol, Total: 175 mg/dL (ref 100–199)
HDL: 44 mg/dL (ref >39)
LDL Chol Calc (NIH): 102 mg/dL — ABNORMAL HIGH (ref 0–99)
Triglycerides: 165 mg/dL — ABNORMAL HIGH (ref 0–149)
VLDL Cholesterol Cal: 29 mg/dL (ref 5–40)

## 2020-03-16 NOTE — Patient Instructions (Signed)
Medication Instructions:  No changes today *If you need a refill on your cardiac medications before your next appointment, please call your pharmacy*   Lab Work: Today: lipids, liver function panel  If you have labs (blood work) drawn today and your tests are completely normal, you will receive your results only by: Marland Kitchen MyChart Message (if you have MyChart) OR . A paper copy in the mail If you have any lab test that is abnormal or we need to change your treatment, we will call you to review the results.   Testing/Procedures: none   Follow-Up: At [facility], you and your health needs are our priority.  As part of our continuing mission to provide you with exceptional heart care, we have created designated Provider Care Teams.  These Care Teams include your primary Cardiologist (physician) and Advanced Practice Providers (APPs -  Physician Assistants and Nurse Practitioners) who all work together to provide you with the care you need, when you need it.  We recommend signing up for the patient portal called "MyChart".  Sign up information is provided on this After Visit Summary.  MyChart is used to connect with patients for Virtual Visits (Telemedicine).  Patients are able to view lab/test results, encounter notes, upcoming appointments, etc.  Non-urgent messages can be sent to your provider as well.   To learn more about what you can do with MyChart, go to [email or web address].    Your next appointment:   6 month(s)  The format for your next appointment:   Either In Person or Virtual  Provider:   You may see Verne Carrow, MD or one of the following Advanced Practice Providers on your designated Care Team:    Ronie Spies, PA-C  Jacolyn Reedy, PA-C    Other Instructions

## 2020-03-16 NOTE — Progress Notes (Signed)
Chief Complaint  Patient presents with  . Follow-up    CAD   History of Present Illness: 64 yo male with a history of CAD s/p CABG 2004 with subsequent PCI, HLD, HTN and mitral regurgitation who is here today for cardiac follow up. He underwent 5V CABG in 2004. 2 drug eluting stents were placed in the SVG to to PDA in July 2013. At that time the LIMA to LAD was patent but the vein grafts to the OM, PLA and Diagonal were occluded. Repeat cath in December 2014 with recurrent severe disease in the SVG to PDA treated with 2 drug eluting stents. Echo August 2015 with normal LV function, mild MR. He was seen here in July 2016 with recurrent chest pain. Cardiac cath July 2016 with new occlusion of SVG to PDA. The only patent graft was the LIMA to LAD. Diffuse disease in native vessels. No targets for PCI. Ranexa was started. Normal ABI June 2018. Echo 12/30/17 with LVEF=55-60%, mild to moderate MR.   He is here today for follow up. The patient denies any chest pain, dyspnea, palpitations, lower extremity edema, orthopnea, PND, dizziness, near syncope or syncope.    Primary Care Physician: [facility] ( Karen Chafe, [place], [place])   Past Medical History:  Diagnosis Date  . Anxiety   . Arthritis    hands, shoulders, arms  . CAD (coronary artery disease)    5V CABG in 2004; stent to RCA x1 and SVG to acute marginal x2 2011;  PTCA/DES x 2 body of SVG to PDA  05/20/12  . CHF (congestive heart failure) (HCC)     EF 55% by echo 03/27/2012  . Chronic lower back pain    "w/activity"  . Depression   . GERD (gastroesophageal reflux disease)   . HLD (hyperlipidemia)   . Hypertension   . Low HDL (under 40)   . Mitral regurgitation    Moderate by TEE 03/27/12  . Myocardial infarction (HCC) 2004  . Numbness and tingling in hands   . Numbness and tingling of both legs   . Umbilical hernia    unrepaired (05/20/12)    Past Surgical History:  Procedure Laterality Date  . CARDIAC  CATHETERIZATION N/A 05/27/2015   Procedure: Left Heart Cath and Coronary Angiography;  Surgeon: Kathleene Hazel, MD;  Location: [facility] INVASIVE CV LAB;  Service: Cardiovascular;  Laterality: N/A;  . CORONARY ANGIOPLASTY WITH STENT PLACEMENT  05/11   stent to RCA x 1 and SVG-acute marginal x2. EF normal  . CORONARY ANGIOPLASTY WITH STENT PLACEMENT  05/20/12   PTCA/DES x 2 body of SVG to PDA   . CORONARY ARTERY BYPASS GRAFT  2004   5v CABG   . INCISION AND DRAINAGE OF WOUND  2010   "from bite; maybe snake or spider; almost lost LLE"  . LEFT HEART CATHETERIZATION WITH CORONARY/GRAFT ANGIOGRAM N/A 05/20/2012   Procedure: LEFT HEART CATHETERIZATION WITH Isabel Caprice;  Surgeon: Kathleene Hazel, MD;  Location: [facility] CATH LAB;  Service: Cardiovascular;  Laterality: N/A;  . LEFT HEART CATHETERIZATION WITH CORONARY/GRAFT ANGIOGRAM N/A 10/30/2013   Procedure: LEFT HEART CATHETERIZATION WITH Isabel Caprice;  Surgeon: Kathleene Hazel, MD;  Location: [facility] CATH LAB;  Service: Cardiovascular;  Laterality: N/A;  . PERCUTANEOUS CORONARY STENT INTERVENTION (PCI-S)  10/30/2013   Procedure: PERCUTANEOUS CORONARY STENT INTERVENTION (PCI-S);  Surgeon: Kathleene Hazel, MD;  Location: [facility] CATH LAB;  Service: Cardiovascular;;  . PTCA  10/30/2013   DES  SVG    .  TEE WITHOUT CARDIOVERSION  03/27/2012   Procedure: TRANSESOPHAGEAL ECHOCARDIOGRAM (TEE);  Surgeon: Larey Dresser, MD;  Location: [facility] ENDOSCOPY;  Service: Cardiovascular;  Laterality: N/A;  to be done at 1100    Current Outpatient Medications  Medication Sig Dispense Refill  . aspirin EC 81 MG tablet Take 81 mg by mouth daily.    . citalopram (CELEXA) 40 MG tablet Take 1 tablet (40 mg total) by mouth daily. 90 tablet 3  . clopidogrel (PLAVIX) 75 MG tablet Take 1 tablet (75 mg total) by mouth daily. 90 tablet 3  . cyclobenzaprine (FLEXERIL) 10 MG tablet Take 1 tablet (10 mg total) by mouth 3 (three) times daily as needed for  muscle spasms. 90 tablet 0  . fluticasone (FLONASE) 50 MCG/ACT nasal spray Place 2 sprays into both nostrils daily as needed for allergies or rhinitis.    . furosemide (LASIX) 20 MG tablet Take 1 tablet (20 mg total) by mouth daily. 90 tablet 3  . gabapentin (NEURONTIN) 100 MG capsule Take 1 capsule by mouth daily.    . metoprolol succinate (TOPROL-XL) 25 MG 24 hr tablet TAKE ONE TABLET BY MOUTH DAILY WITH OR IMMEDIATELY FOLLOWING A MEAL. 90 tablet 3  . Multiple Vitamin (MULTIVITAMIN WITH MINERALS) TABS tablet Take 1 tablet by mouth daily.    . nitroGLYCERIN (NITROSTAT) 0.4 MG SL tablet DISSOLVE 1 TABLET UNDER TONGUE EVERY 5 MINUTES UP TO 15 MIN FOR CHESTPAIN. IF NO RELIEF CALL 911. 25 tablet 6  . pantoprazole (PROTONIX) 40 MG tablet TAKE 1 TABLET BY MOUTH ONCE DAILY AT 6:00 AM. 90 tablet 3  . potassium chloride SA (K-DUR) 20 MEQ tablet Take 1 tablet (20 mEq total) by mouth daily. 90 tablet 3  . pravastatin (PRAVACHOL) 80 MG tablet Take 1 tablet (80 mg total) by mouth every evening. 90 tablet 3  . ranolazine (RANEXA) 500 MG 12 hr tablet Take 1 tablet (500 mg total) by mouth 2 (two) times daily. 180 tablet 3  . tetrahydrozoline-zinc (VISINE-AC) 0.05-0.25 % ophthalmic solution Place 2 drops into both eyes 3 (three) times daily as needed (itching/burning).     No current facility-administered medications for this visit.    Allergies  Allergen Reactions  . Sulfa Antibiotics Rash  . Hydrocodone Hives and Itching    Social History   Socioeconomic History  . Marital status: Married    Spouse name: Not on file  . Number of children: 3  . Years of education: Not on file  . Highest education level: Not on file  Occupational History  . Occupation: Unemployed HVAC  Tobacco Use  . Smoking status: Former Smoker    Packs/day: 3.00    Years: 40.00    Pack years: 120.00    Types: Cigarettes    Quit date: 01/23/2003    Years since quitting: 17.1  . Smokeless tobacco: Never Used  Substance and  Sexual Activity  . 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 Drug use: No  . Sexual activity: Yes  Other Topics Concern  . Not on file  Social History Narrative  . Not on file   Social Determinants of Health   Financial Resource Strain:   . Difficulty of Paying Living Expenses:   Food Insecurity:   . Worried About Charity fundraiser in the Last Year:   . Arboriculturist in the Last Year:   Transportation Needs:   . Lack  of Transportation (Medical):   Marland Kitchen Lack of Transportation (Non-Medical):   Physical Activity:   . Days of Exercise per Week:   . Minutes of Exercise per Session:   Stress:   . Feeling of Stress :   Social Connections:   . Frequency of Communication with Friends and Family:   . Frequency of Social Gatherings with Friends and Family:   . Attends Religious Services:   . Active Member of Clubs or Organizations:   . Attends Banker Meetings:   Marland Kitchen Marital Status:   Intimate Partner Violence:   . Fear of Current or Ex-Partner:   . Emotionally Abused:   Marland Kitchen Physically Abused:   . Sexually Abused:     Family History  Problem Relation Age of Onset  . Heart disease Mother   . Cancer Mother        ? type  . Diabetes Father   . Hyperlipidemia Father   . Hypertension Father   . Stomach cancer Father   . Heart disease Brother 75       X 5 brothers    Review of Systems:  As stated in the HPI and otherwise negative.   BP 116/60   Pulse 68   Ht 5\' 6"  (1.676 m)   Wt 192 lb 1.9 oz (87.1 kg)   SpO2 96%   BMI 31.01 kg/m   Physical Examination:  General: Well developed, well nourished, NAD  HEENT: OP clear, mucus membranes moist  SKIN: warm, dry. No rashes. Neuro: No focal deficits  Musculoskeletal: Muscle strength 5/5 all ext  Psychiatric: Mood and affect normal  Neck: No JVD, no carotid bruits, no thyromegaly, no lymphadenopathy.  Lungs:Clear bilaterally, no wheezes, rhonci,  crackles Cardiovascular: Regular rate and rhythm. No murmurs, gallops or rubs. Abdomen:Soft. Bowel sounds present. Non-tender.  Extremities: No lower extremity edema. Pulses are 2 + in the bilateral DP/PT.  Cardiac cath July 2016: Left Anterior Descending  The vessel is large .   August 2016 Ost LAD to Prox LAD lesion, 100% stenosed. chronic total occlusion .      Ramus Intermedius  The vessel is moderate in size .   Marland Kitchen Ramus lesion, 50% stenosed. diffuse .     Left Circumflex   . Second Obtuse Marginal Branch   The vessel is small in size.   Marland Kitchen 2nd Mrg lesion, 80% stenosed. discrete . Very small caliber vessel.   . Third Obtuse Marginal Branch   The vessel is small in size.     Right Coronary Artery   . Prox RCA lesion, 30% stenosed. diffuse .   Suezanne Jacquet Dist RCA lesion, 90% stenosed. discrete .   Marland Kitchen Right Posterior Descending Artery   The vessel is small in size.   . Right Posterior Atrioventricular Branch   The vessel is small in size.     Graft Angiography    Free LIMA Graft to Mid LAD  LIMA was injected is normal in caliber, and is anatomically normal.     Free Graft to 1st Diag  SVG was injected . There is severe disease in the graft. Graft is chronically occluded.   . Origin to Prox Graft lesion, 100% stenosed. chronic total occlusion .     Free Graft to 2nd Mrg  SVG was injected . There is severe disease in the graft. The graft is chronically occluded.   . Origin lesion, 100% stenosed. chronic total occlusion .     Free Graft to RPDA  SVG was  injected . There is severe disease in the graft. The graft is occluded.   . Origin lesion, 100% stenosed. chronic total occlusion .       Coronary Diagrams   Diagnostic Diagram          Echo 12/30/17: Left ventricle: The cavity size was normal. Wall thickness was   normal. Systolic function was normal. The estimated ejection   fraction was in the range of 55% to 60%. Wall motion was normal;   there were no regional wall  motion abnormalities. Left   ventricular diastolic function parameters were normal. - Aortic valve: Valve area (VTI): 2.55 cm^2. Valve area (Vmax): 2.5   cm^2. Valve area (Vmean): 1.85 cm^2. - Mitral valve: There was mild regurgitation. The MR is eccentric   and posteriorally direceted, may be underestimated. The MV/AV VTI   ratio is 1.1, suggesting mild to moderate MR Valve area by   continuity equation (using LVOT flow): 2.27 cm^2.  EKG:  EKG is not ordered today. The ekg ordered today demonstrates   Recent Labs: 08/19/2019: ALT 23; BUN 12; Creatinine, Ser 1.20; Potassium 4.3; Sodium 140   Lipid Panel    Component Value Date/Time   CHOL 166 08/19/2019 0858   TRIG 195 (H) 08/19/2019 0858   HDL 42 08/19/2019 0858   CHOLHDL 4.0 08/19/2019 0858   CHOLHDL 3.9 02/22/2016 0844   VLDL 40 (H) 02/22/2016 0844   LDLCALC 91 08/19/2019 0858     Wt Readings from Last 3 Encounters:  03/16/20 192 lb 1.9 oz (87.1 kg)  10/07/19 196 lb 3.2 oz (89 kg)  08/05/19 193 lb (87.5 kg)     Other studies Reviewed: Additional studies/ records that were reviewed today include: . Review of the above records demonstrates:    Assessment and Plan:   1. CAD with stable angina: He has stable chest pains. Continue ASA, Plavix, Ranexa, beta blocker and statin.      2. HTN: HIs BP is well controlled. Continue current therapy  3. Hyperlipidemia: LDL not at goal in 2020. Statin increased but he missed his f/u appt for labs. Repeat lipids and LFTS today.    4. Mitral regurgitation: Mild to moderate by echo 2019.  Will repeat echo in 2022. .   5. Chronic diastolic CHF: No fluid overload. Weight is stable. Continue Lasix.   Current medicines are reviewed at length with the patient today.  The patient does not have concerns regarding medicines.  The following changes have been made:  no change  Labs/ tests ordered today include:   Orders Placed This Encounter  Procedures  . Hepatic function panel  .  Lipid panel    Disposition:   FU with me in 6 months  Signed, Verne Carrow, MD 03/16/2020 9:39 AM    [facility] [address] [place], [place], [place]  [address] Phone: [phone]; Fax: [phone]

## 2020-03-17 ENCOUNTER — Other Ambulatory Visit: Payer: Self-pay | Admitting: Family Medicine

## 2020-03-17 DIAGNOSIS — E785 Hyperlipidemia, unspecified: Secondary | ICD-10-CM

## 2020-03-17 DIAGNOSIS — E786 Lipoprotein deficiency: Secondary | ICD-10-CM

## 2020-03-17 MED ORDER — ROSUVASTATIN CALCIUM 40 MG PO TABS: 40.0000 mg | ORAL_TABLET | Freq: Every day | ORAL | 3 refills | Status: DC

## 2020-03-17 NOTE — Progress Notes (Signed)
Patient had lipid panel at Cardiologist office and LDL is still above goal. Will switch to Atorvastatin 40mg  and recheck lipids in 12 weeks.

## 2020-03-18 ENCOUNTER — Telehealth: Payer: Self-pay | Admitting: *Deleted

## 2020-03-18 DIAGNOSIS — E7800 Pure hypercholesterolemia, unspecified: Secondary | ICD-10-CM

## 2020-03-18 MED ORDER — PRAVASTATIN SODIUM 80 MG PO TABS: 80.0000 mg | ORAL_TABLET | Freq: Every evening | ORAL | 3 refills | Status: DC

## 2020-03-18 NOTE — Telephone Encounter (Signed)
I spoke with patient's wife and reviewed lab results with her. She gets patient's medicines together for patient. She reports patient initially increased Pravastatin to 80 mg daily in October 2020.  Took this dose for 3 months but then pharmacy refilled for 40 mg daily and pt has been taking 40 mg daily since that time.  Chart shows patient is currently on Rosuvastatin 40 mg daily prescribed by Dr Karen Chafe. Wife reports patient has not seen Dr Karen Chafe recently and they are not aware of this med change.  Wife is sure patient was taking Pravastatin 40 mg when lab work done on 4/28.  Will update Dr Clifton James and see what he recommends patient take

## 2020-03-18 NOTE — Telephone Encounter (Signed)
-----   Message from Kathleene Hazel, MD sent at 03/17/2020  8:25 AM EDT ----- His LDL is not at goal of 70 on higher dose of Pravastatin. We cannot go higher. We can either add Zetia 10 mg daily or change his statin to Crestor 40 mg daily. Repeat lipids and LFTS in 12 weeks after change.

## 2020-03-18 NOTE — Telephone Encounter (Signed)
Patient's wife notified. [facility] [address] in [place].  Patient will come in for fasting lab work on July 26,2021.  [facility] and confirmed [facility] for 80 mg Pravastatin. I asked [facility] to [facility] all other statin prescriptions from patient's profile.  [facility] confirms patient has not picked up Rosuvastatin.

## 2020-03-18 NOTE — Telephone Encounter (Signed)
I thought he was on Pravastatin 80. If he has been taking only 40 mg, he can increase the pravastatin to 80 mg daily for three months then repeat lipids and LFTs. Thanks, chris

## 2020-04-08 DIAGNOSIS — K219 Gastro-esophageal reflux disease without esophagitis: Secondary | ICD-10-CM | POA: Diagnosis not present

## 2020-04-08 DIAGNOSIS — I252 Old myocardial infarction: Secondary | ICD-10-CM | POA: Diagnosis not present

## 2020-04-08 DIAGNOSIS — R69 Illness, unspecified: Secondary | ICD-10-CM | POA: Diagnosis not present

## 2020-04-08 DIAGNOSIS — E669 Obesity, unspecified: Secondary | ICD-10-CM | POA: Diagnosis not present

## 2020-04-08 DIAGNOSIS — E785 Hyperlipidemia, unspecified: Secondary | ICD-10-CM | POA: Diagnosis not present

## 2020-04-08 DIAGNOSIS — I110 Hypertensive heart disease with heart failure: Secondary | ICD-10-CM | POA: Diagnosis not present

## 2020-04-08 DIAGNOSIS — E261 Secondary hyperaldosteronism: Secondary | ICD-10-CM | POA: Diagnosis not present

## 2020-04-08 DIAGNOSIS — I509 Heart failure, unspecified: Secondary | ICD-10-CM | POA: Diagnosis not present

## 2020-04-08 DIAGNOSIS — Z008 Encounter for other general examination: Secondary | ICD-10-CM | POA: Diagnosis not present

## 2020-04-08 DIAGNOSIS — I2510 Atherosclerotic heart disease of native coronary artery without angina pectoris: Secondary | ICD-10-CM | POA: Diagnosis not present

## 2020-06-12 ENCOUNTER — Other Ambulatory Visit: Payer: Self-pay | Admitting: Family Medicine

## 2020-06-13 ENCOUNTER — Other Ambulatory Visit: Payer: Self-pay

## 2020-06-13 ENCOUNTER — Other Ambulatory Visit: Payer: Medicare HMO | Admitting: *Deleted

## 2020-06-13 DIAGNOSIS — E7800 Pure hypercholesterolemia, unspecified: Secondary | ICD-10-CM | POA: Diagnosis not present

## 2020-06-13 LAB — LIPID PANEL
Chol/HDL Ratio: 4 ratio (ref 0.0–5.0)
Cholesterol, Total: 167 mg/dL (ref 100–199)
HDL: 42 mg/dL (ref >39)
LDL Chol Calc (NIH): 95 mg/dL (ref 0–99)
Triglycerides: 173 mg/dL — ABNORMAL HIGH (ref 0–149)
VLDL Cholesterol Cal: 30 mg/dL (ref 5–40)

## 2020-06-13 LAB — HEPATIC FUNCTION PANEL
ALT: 15 IU/L (ref 0–44)
AST: 19 IU/L (ref 0–40)
Albumin: 4.3 g/dL (ref 3.8–4.8)
Alkaline Phosphatase: 100 IU/L (ref 48–121)
Bilirubin Total: 0.4 mg/dL (ref 0.0–1.2)
Bilirubin, Direct: 0.12 mg/dL (ref 0.00–0.40)
Total Protein: 6.3 g/dL (ref 6.0–8.5)

## 2020-06-21 ENCOUNTER — Telehealth: Payer: Self-pay | Admitting: *Deleted

## 2020-06-21 NOTE — Telephone Encounter (Signed)
-----   Message from Kathleene Hazel, MD sent at 06/14/2020 10:23 AM EDT ----- LFTs are normal. With increasing his Pravastatin to 80 mg daily, we lowered his LDL from 102 down to 95. Goal LDL is 70. I would recommend changing to Crestor 40 mg daily and stopping the pravastatin or the other option would be to add Zetia 10 mg daily to the Pravastatin. Repeat lipids and LFTs 12 weeks after the changes. Thanks, chris

## 2020-06-21 NOTE — Telephone Encounter (Signed)
Talked w patient's wife. She will check on cost of rosuvastatin and zetia with patient's insurance company.  She thinks he may not be able to afford either of these but will call back on Friday after she finds out what his cost will be.

## 2020-07-01 NOTE — Telephone Encounter (Signed)
Left message for patient to call back  

## 2020-07-06 ENCOUNTER — Telehealth: Payer: Self-pay | Admitting: Cardiovascular Disease

## 2020-07-06 DIAGNOSIS — E7800 Pure hypercholesterolemia, unspecified: Secondary | ICD-10-CM

## 2020-07-06 MED ORDER — ROSUVASTATIN CALCIUM 40 MG PO TABS
40.0000 mg | ORAL_TABLET | Freq: Every day | ORAL | 3 refills | Status: DC
Start: 2020-07-06 — End: 2020-10-11

## 2020-07-06 NOTE — Telephone Encounter (Signed)
Frank [name], you were talking with the pt's wife concerning this medication. Pt states that he is ready to start rosuvastatin. Please address

## 2020-07-06 NOTE — Telephone Encounter (Signed)
Spoke with patient's mother in law who states the patient is ready to start Crestor.  She is aware and will tell him to stop pravastatin.  He will come in 3 months for labs.  Appointment has been scheduled.  She will tell pt the date.

## 2020-07-06 NOTE — Telephone Encounter (Signed)
*  STAT* If patient is at the pharmacy, call can be transferred to refill team.   1. Which medications need to be refilled? (please list name of each medication and dose if known) rosuvastatin  2. Which pharmacy/location (including street and city if local pharmacy) is medication to be sent to?  [id number] [place], [place] - [address] AT [address]  3. Do they need a 30 day or 90 day supply? 90   Patient said he is ready to start taking this medication.

## 2020-07-06 NOTE — Telephone Encounter (Signed)
See telephone encounter 8/18 for further details.

## 2020-09-15 ENCOUNTER — Other Ambulatory Visit: Payer: Self-pay | Admitting: Cardiovascular Disease

## 2020-09-16 ENCOUNTER — Other Ambulatory Visit: Payer: Self-pay | Admitting: Cardiovascular Disease

## 2020-10-05 ENCOUNTER — Other Ambulatory Visit: Payer: Self-pay | Admitting: *Deleted

## 2020-10-05 ENCOUNTER — Telehealth: Payer: Self-pay | Admitting: Cardiovascular Disease

## 2020-10-05 DIAGNOSIS — E7800 Pure hypercholesterolemia, unspecified: Secondary | ICD-10-CM

## 2020-10-05 NOTE — Telephone Encounter (Signed)
Patient will go to [facility] Friday for labs.  Order changed and released.

## 2020-10-05 NOTE — Telephone Encounter (Signed)
New Message:      Pt wants to know if he can get his lab work in [place] somewhere please?Marland Kitchen He is having problems with is car and can not come to [place].

## 2020-10-07 ENCOUNTER — Other Ambulatory Visit: Payer: Medicare HMO

## 2020-10-10 DIAGNOSIS — E7800 Pure hypercholesterolemia, unspecified: Secondary | ICD-10-CM | POA: Diagnosis not present

## 2020-10-11 ENCOUNTER — Other Ambulatory Visit: Payer: Self-pay | Admitting: Cardiovascular Disease

## 2020-10-11 ENCOUNTER — Telehealth: Payer: Self-pay | Admitting: *Deleted

## 2020-10-11 ENCOUNTER — Telehealth: Payer: Self-pay | Admitting: Cardiovascular Disease

## 2020-10-11 DIAGNOSIS — E7800 Pure hypercholesterolemia, unspecified: Secondary | ICD-10-CM

## 2020-10-11 LAB — LIPID PANEL
Chol/HDL Ratio: 4.3 ratio (ref 0.0–5.0)
Cholesterol, Total: 186 mg/dL (ref 100–199)
HDL: 43 mg/dL (ref >39)
LDL Chol Calc (NIH): 101 mg/dL — ABNORMAL HIGH (ref 0–99)
Triglycerides: 246 mg/dL — ABNORMAL HIGH (ref 0–149)
VLDL Cholesterol Cal: 42 mg/dL — ABNORMAL HIGH (ref 5–40)

## 2020-10-11 MED ORDER — ROSUVASTATIN CALCIUM 40 MG PO TABS
40.0000 mg | ORAL_TABLET | Freq: Every day | ORAL | 3 refills | Status: DC
Start: 2020-10-11 — End: 2021-03-14

## 2020-10-11 MED ORDER — RANOLAZINE ER 500 MG PO TB12
500.0000 mg | ORAL_TABLET | Freq: Two times a day (BID) | ORAL | 1 refills | Status: DC
Start: 2020-10-11 — End: 2021-04-19

## 2020-10-11 MED ORDER — PANTOPRAZOLE SODIUM 40 MG PO TBEC
DELAYED_RELEASE_TABLET | ORAL | 1 refills | Status: DC
Start: 2020-10-11 — End: 2021-04-19

## 2020-10-11 NOTE — Telephone Encounter (Signed)
*  STAT* If patient is at the pharmacy, call can be transferred to refill team.   1. Which medications need to be refilled? (please list name of each medication and dose if known)  ranolazine (RANEXA) 500 MG 12 hr tablet pantoprazole (PROTONIX) 40 MG tablet  2. Which pharmacy/location (including street and city if local pharmacy) is medication to be sent to? [id number] [place], [place] - [address] AT [address]  3. Do they need a 30 day or 90 day supply? 90 day  Patient is out of medication

## 2020-10-11 NOTE — Telephone Encounter (Signed)
-----   Message from Kathleene Hazel, MD sent at 10/11/2020  8:18 AM EST ----- Even with changing his Pravastatin to Crestor 40 mg daily, his LDL is still not at goal. I would suggest adding Zetia 10 mg daily and repeating his lipid profile in 12 weeks. If we are still not at goal then, will need referral to the lipid clinic to consider Repatha or Praluent. Thayer Ohm

## 2020-10-11 NOTE — Telephone Encounter (Signed)
Reviewed with patient's wife.  She had picked up pravastatin 80 and pt was still taking that daily and not Crestor. She mistakenly told the pharmacy pt was not taking Crestor.  He is now going to stop pravastatin and start the Crestor.  He will plan to re check lipids in [place] [facility] in 12 weeks.

## 2020-10-11 NOTE — Telephone Encounter (Signed)
Pt's medications were sent to pt's pharmacy as requested. Confirmation received.  

## 2020-10-13 ENCOUNTER — Other Ambulatory Visit: Payer: Self-pay | Admitting: Cardiovascular Disease

## 2020-10-14 ENCOUNTER — Other Ambulatory Visit: Payer: Self-pay | Admitting: Cardiovascular Disease

## 2021-01-17 ENCOUNTER — Other Ambulatory Visit: Payer: Self-pay | Admitting: Cardiovascular Disease

## 2021-02-11 ENCOUNTER — Other Ambulatory Visit: Payer: Self-pay | Admitting: Cardiovascular Disease

## 2021-02-22 ENCOUNTER — Other Ambulatory Visit: Payer: Self-pay

## 2021-02-22 ENCOUNTER — Encounter: Payer: Self-pay | Admitting: Family Medicine

## 2021-02-22 ENCOUNTER — Other Ambulatory Visit: Payer: Self-pay | Admitting: Family Medicine

## 2021-02-22 ENCOUNTER — Ambulatory Visit (INDEPENDENT_AMBULATORY_CARE_PROVIDER_SITE_OTHER): Payer: Medicare Other | Admitting: Family Medicine

## 2021-02-22 VITALS — BP 130/60 | HR 59 | Ht 66.0 in | Wt 198.0 lb

## 2021-02-22 DIAGNOSIS — Z1211 Encounter for screening for malignant neoplasm of colon: Secondary | ICD-10-CM | POA: Diagnosis not present

## 2021-02-22 DIAGNOSIS — R5383 Other fatigue: Secondary | ICD-10-CM | POA: Diagnosis not present

## 2021-02-22 DIAGNOSIS — E785 Hyperlipidemia, unspecified: Secondary | ICD-10-CM

## 2021-02-22 DIAGNOSIS — R8290 Unspecified abnormal findings in urine: Secondary | ICD-10-CM

## 2021-02-22 DIAGNOSIS — Z87891 Personal history of nicotine dependence: Secondary | ICD-10-CM | POA: Diagnosis not present

## 2021-02-22 DIAGNOSIS — I509 Heart failure, unspecified: Secondary | ICD-10-CM

## 2021-02-22 DIAGNOSIS — K219 Gastro-esophageal reflux disease without esophagitis: Secondary | ICD-10-CM | POA: Diagnosis not present

## 2021-02-22 DIAGNOSIS — I25708 Atherosclerosis of coronary artery bypass graft(s), unspecified, with other forms of angina pectoris: Secondary | ICD-10-CM | POA: Diagnosis not present

## 2021-02-22 DIAGNOSIS — I10 Essential (primary) hypertension: Secondary | ICD-10-CM

## 2021-02-22 NOTE — Assessment & Plan Note (Signed)
Nonspecific symptoms today.  No obvious chest pain or shortness of breath.  I do not have any significant suspicion that this is related to poorly controlled heart failure.  This may be related to a statin induced myopathy.  We will follow up with appropriate labs. -Follow-up CMP, CBC, TSH, UA

## 2021-02-22 NOTE — Patient Instructions (Signed)
It was great to see you today.  Here is a quick review of the things we talked about:  Cholesterol: We do not need to look at your cholesterol today but I will get some blood work and take a look at your liver.  I am also going to take a look at your urine.  I will let you know if there are concerning findings related to your cholesterol medication.  Heart health: It sounds like your heart is doing well.  Please make sure to keep up with regular visits with your cardiologist.  Fatigue: We will get some blood work today to see if there is any medical reason for your low energy.  Colon cancer screening: I have put in an order for Cologuard.  Lung cancer screening: We are going to get another CT of your chest to do yearly screening for lung cancer.

## 2021-02-22 NOTE — Addendum Note (Signed)
Addended by: Nada Libman on: 02/22/2021 12:20 PM   Modules accepted: Level of Service

## 2021-02-22 NOTE — Assessment & Plan Note (Signed)
Stable.  Well-controlled at this time. -Continue metoprolol and furosemide -Follow-up CMP

## 2021-02-22 NOTE — Assessment & Plan Note (Signed)
Stable.  No chest pain at this time. -Continue current medication -Follow-up with cardiology then of this month

## 2021-02-22 NOTE — Assessment & Plan Note (Signed)
No evidence of fluid overload at this time. -Follow-up CMP -Continue current medication -Follow-up with cardiology then this month.

## 2021-02-22 NOTE — Progress Notes (Signed)
    SUBJECTIVE:   CHIEF COMPLAINT / HPI:   Cholesterol He was started on rosuvastatin 40 mg by his cardiologist about 11 months ago.  Starting about 10 months ago, his wife noted that his urine seem to be quite dark.  Additionally, he does note occasional fatigue when walking uphill.  He denies specific pain over his thighs or arms.  He would like blood work today to assess his cholesterol and potential issues related to his cholesterol medication.  He has previously had trouble with other statins.  Heart failure His care medications include: -lasix 20 mg Daily - metoprolol 25 mg Some fatigue walking up a hill.  This seems to be slowly building issue over the past months to years.  He does have follow-up with cardiology at the end of this month.  No chest pain at this time.   CAD His current medication includes: - ASA 81 mg daily -ranexa 500 mg BID - plavix 75 mg daily No chest pain at this time.  He has follow-up with cardiology at the end of this month.  GERD His current medication includes: - pantoprazole daily This seems to control his reflux symptoms well.  MDD His current medication includes: -citalporam 40 mg Been on this medication for many years and is very happy with it.  Hx of smoking He stopped smoking in 2004.  Prior to that, he has been smoking for about 40 years and had been smoking 2 packs a day at most.  His last lung cancer screening was 3 years ago.  PERTINENT  PMH / PSH: CAD s/p multiple stents,  OBJECTIVE:   BP 130/60   Pulse (!) 59   Ht 5\' 6"  (1.676 m)   Wt 198 lb (89.8 kg)   SpO2 98%   BMI 31.96 kg/m    General: Alert and cooperative and appears to be in no acute distress.  He preferred that his wife provides most of his medication and medical history.  Able to walk comfortably around the exam room and step up onto the exam table without effort. Cardio: Normal S1 and S2, no S3 or S4. Rhythm is regular. No murmurs or rubs.   Pulm: Clear to  auscultation bilaterally, no crackles, wheezing, or diminished breath sounds. Normal respiratory effort Abdomen: Bowel sounds normal. Abdomen soft and non-tender.  Extremities: No peripheral edema. Warm/ well perfused.  Strong radial pulses. Neuro: Cranial nerves grossly intact  ASSESSMENT/PLAN:   CAD Stable.  No chest pain at this time. -Continue current medication -Follow-up with cardiology then of this month  Essential hypertension, benign Stable.  Well-controlled at this time. -Continue metoprolol and furosemide -Follow-up CMP   Congestive heart failure No evidence of fluid overload at this time. -Follow-up CMP -Continue current medication -Follow-up with cardiology then this month.  Smoking history -CT chest ordered  Fatigue Nonspecific symptoms today.  No obvious chest pain or shortness of breath.  I do not have any significant suspicion that this is related to poorly controlled heart failure.  This may be related to a statin induced myopathy.  We will follow up with appropriate labs. -Follow-up CMP, CBC, TSH, UA  Screening for colon cancer -Cologuard ordered     , MD [facility] [place] [facility] Medicine [facility]

## 2021-02-22 NOTE — Assessment & Plan Note (Signed)
Cologuard ordered

## 2021-02-22 NOTE — Progress Notes (Signed)
Contacted pt about his CT scan appt. Pt did not answer. Left detailed vm.  Maren Reamer

## 2021-02-22 NOTE — Assessment & Plan Note (Signed)
CT chest ordered.

## 2021-02-23 ENCOUNTER — Encounter: Payer: Self-pay | Admitting: Family Medicine

## 2021-02-23 LAB — COMPREHENSIVE METABOLIC PANEL
ALT: 20 IU/L (ref 0–44)
AST: 23 IU/L (ref 0–40)
Albumin/Globulin Ratio: 2 (ref 1.2–2.2)
Albumin: 4.5 g/dL (ref 3.8–4.8)
Alkaline Phosphatase: 94 IU/L (ref 44–121)
BUN/Creatinine Ratio: 12 (ref 10–24)
BUN: 15 mg/dL (ref 8–27)
Bilirubin Total: 0.3 mg/dL (ref 0.0–1.2)
CO2: 20 mmol/L (ref 20–29)
Calcium: 9.3 mg/dL (ref 8.6–10.2)
Chloride: 104 mmol/L (ref 96–106)
Creatinine, Ser: 1.21 mg/dL (ref 0.76–1.27)
Globulin, Total: 2.2 g/dL (ref 1.5–4.5)
Glucose: 103 mg/dL — ABNORMAL HIGH (ref 65–99)
Potassium: 4.6 mmol/L (ref 3.5–5.2)
Sodium: 143 mmol/L (ref 134–144)
Total Protein: 6.7 g/dL (ref 6.0–8.5)
eGFR: 66 mL/min/{1.73_m2} (ref >59)

## 2021-02-23 LAB — URINALYSIS
Bilirubin, UA: NEGATIVE
Glucose, UA: NEGATIVE
Ketones, UA: NEGATIVE
Leukocytes,UA: NEGATIVE
Nitrite, UA: NEGATIVE
Protein,UA: NEGATIVE
RBC, UA: NEGATIVE
Specific Gravity, UA: 1.008 (ref 1.005–1.030)
Urobilinogen, Ur: 0.2 mg/dL (ref 0.2–1.0)
pH, UA: 6.5 (ref 5.0–7.5)

## 2021-02-23 LAB — CBC
Hematocrit: 45 % (ref 37.5–51.0)
Hemoglobin: 14.6 g/dL (ref 13.0–17.7)
MCH: 29.7 pg (ref 26.6–33.0)
MCHC: 32.4 g/dL (ref 31.5–35.7)
MCV: 92 fL (ref 79–97)
Platelets: 187 10*3/uL (ref 150–450)
RBC: 4.91 x10E6/uL (ref 4.14–5.80)
RDW: 13.2 % (ref 11.6–15.4)
WBC: 7 10*3/uL (ref 3.4–10.8)

## 2021-02-23 LAB — CK: Total CK: 75 U/L (ref 41–331)

## 2021-02-23 LAB — TSH: TSH: 3.96 u[IU]/mL (ref 0.450–4.500)

## 2021-03-09 ENCOUNTER — Inpatient Hospital Stay: Admission: RE | Admit: 2021-03-09 | Payer: Medicare Other | Source: Ambulatory Visit

## 2021-03-14 ENCOUNTER — Ambulatory Visit: Payer: Medicare Other | Admitting: Physician Assistant

## 2021-03-14 ENCOUNTER — Other Ambulatory Visit: Payer: Self-pay

## 2021-03-14 ENCOUNTER — Encounter: Payer: Self-pay | Admitting: Physician Assistant

## 2021-03-14 VITALS — BP 110/78 | HR 53 | Ht 66.0 in | Wt 197.2 lb

## 2021-03-14 DIAGNOSIS — I5032 Chronic diastolic (congestive) heart failure: Secondary | ICD-10-CM | POA: Diagnosis not present

## 2021-03-14 DIAGNOSIS — R0600 Dyspnea, unspecified: Secondary | ICD-10-CM

## 2021-03-14 DIAGNOSIS — E7800 Pure hypercholesterolemia, unspecified: Secondary | ICD-10-CM | POA: Diagnosis not present

## 2021-03-14 DIAGNOSIS — R0609 Other forms of dyspnea: Secondary | ICD-10-CM

## 2021-03-14 DIAGNOSIS — I10 Essential (primary) hypertension: Secondary | ICD-10-CM | POA: Diagnosis not present

## 2021-03-14 DIAGNOSIS — I25118 Atherosclerotic heart disease of native coronary artery with other forms of angina pectoris: Secondary | ICD-10-CM | POA: Diagnosis not present

## 2021-03-14 MED ORDER — CLOPIDOGREL BISULFATE 75 MG PO TABS: 1.0000 | ORAL_TABLET | Freq: Every day | ORAL | 3 refills | Status: DC

## 2021-03-14 MED ORDER — FUROSEMIDE 20 MG PO TABS: 20.0000 mg | ORAL_TABLET | Freq: Every day | ORAL | 3 refills | Status: DC

## 2021-03-14 MED ORDER — ROSUVASTATIN CALCIUM 40 MG PO TABS: 40.0000 mg | ORAL_TABLET | Freq: Every day | ORAL | 3 refills | Status: DC

## 2021-03-14 MED ORDER — METOPROLOL SUCCINATE ER 25 MG PO TB24: ORAL_TABLET | ORAL | 3 refills | Status: DC

## 2021-03-14 MED ORDER — POTASSIUM CHLORIDE CRYS ER 20 MEQ PO TBCR: EXTENDED_RELEASE_TABLET | ORAL | 3 refills | Status: DC

## 2021-03-14 NOTE — Progress Notes (Signed)
Cardiology Office Note:    Date:  03/14/2021   ID:  Frank [name], DOB [birth year], MRN [id number]  PCP:  Frank Collum, DO  [facility] Cardiologist:  Frank Carrow, MD  [facility] Electrophysiologist:  None   Chief Complaint: yearly follow up for CAD  History of Present Illness:    Frank [name] is a 66 y.o. male with a hx of CAD s/p CABG 2004 with subsequent PCI, HLD, HTN and mitral regurgitation seen for follow up.   Hx of CAD s/p CABG x 5 in 2004. 2 drug eluting stents were placed in the SVG to to PDA in July 2013. At that time the LIMA to LAD was patent but the vein grafts to the OM, PLA and Diagonal were occluded. Repeat cath in December 2014 with recurrent severe disease in the SVG to PDA treated with 2 drug eluting stents. Echo August 2015 with normal LV function, mild MR. He was seen here in July 2016 with recurrent chest pain. Cardiac cath July 2016 with new occlusion of SVG to PDA. The only patent graft was the LIMA to LAD. Diffuse disease in native vessels. No targets for PCI. Ranexa was started. Normal ABI June 2018. Echo 12/30/17 with LVEF=55-60%, mild to moderate MR.   Last seen by Dr. Clifton [name] 02/2020.  Patient is here for yearly follow-up.  He reports dyspnea on exertion.  He walks 3-4 blocks each day,  while walking he has slight incline and he needs to stop.  His lack of energy with walking.  No associated chest tightness, diaphoresis, nausea or vomiting.  He had somewhat similar symptoms with prior PCI but also had chest discomfort.  He denies palpitation, dizziness, orthopnea, PND, syncope, lower extremity edema or melena.  Past Medical History:  Diagnosis Date  . Anxiety   . Arthritis    hands, shoulders, arms  . CAD (coronary artery disease)    5V CABG in 2004; stent to RCA x1 and SVG to acute marginal x2 2011;  PTCA/DES x 2 body of SVG to PDA  05/20/12  . CHF (congestive heart failure) (HCC)     EF 55% by echo 03/27/2012  . Chronic lower back pain     "w/activity"  . Depression   . GERD (gastroesophageal reflux disease)   . HLD (hyperlipidemia)   . Hypertension   . Low HDL (under 40)   . Mitral regurgitation    Moderate by TEE 03/27/12  . Myocardial infarction (HCC) 2004  . Numbness and tingling in hands   . Numbness and tingling of both legs   . Umbilical hernia    unrepaired (05/20/12)    Past Surgical History:  Procedure Laterality Date  . CARDIAC CATHETERIZATION N/A 05/27/2015   Procedure: Left Heart Cath and Coronary Angiography;  Surgeon: Kathleene Hazel, MD;  Location: [facility] INVASIVE CV LAB;  Service: Cardiovascular;  Laterality: N/A;  . CORONARY ANGIOPLASTY WITH STENT PLACEMENT  05/11   stent to RCA x 1 and SVG-acute marginal x2. EF normal  . CORONARY ANGIOPLASTY WITH STENT PLACEMENT  05/20/12   PTCA/DES x 2 body of SVG to PDA   . CORONARY ARTERY BYPASS GRAFT  2004   5v CABG   . INCISION AND DRAINAGE OF WOUND  2010   "from bite; maybe snake or spider; almost lost LLE"  . LEFT HEART CATHETERIZATION WITH CORONARY/GRAFT ANGIOGRAM N/A 05/20/2012   Procedure: LEFT HEART CATHETERIZATION WITH Isabel Caprice;  Surgeon: Kathleene Hazel, MD;  Location: [facility] CATH LAB;  Service: Cardiovascular;  Laterality: N/A;  . LEFT HEART CATHETERIZATION WITH CORONARY/GRAFT ANGIOGRAM N/A 10/30/2013   Procedure: LEFT HEART CATHETERIZATION WITH Isabel Caprice;  Surgeon: Kathleene Hazel, MD;  Location: [facility] CATH LAB;  Service: Cardiovascular;  Laterality: N/A;  . PERCUTANEOUS CORONARY STENT INTERVENTION (PCI-S)  10/30/2013   Procedure: PERCUTANEOUS CORONARY STENT INTERVENTION (PCI-S);  Surgeon: Kathleene Hazel, MD;  Location: [facility] CATH LAB;  Service: Cardiovascular;;  . PTCA  10/30/2013   DES  SVG    . TEE WITHOUT CARDIOVERSION  03/27/2012   Procedure: TRANSESOPHAGEAL ECHOCARDIOGRAM (TEE);  Surgeon: Laurey Morale, MD;  Location: [facility] ENDOSCOPY;  Service: Cardiovascular;  Laterality: N/A;  to be done at 1100     Current Medications: Current Meds  Medication Sig  . aspirin EC 81 MG tablet Take 81 mg by mouth daily.  . citalopram (CELEXA) 40 MG tablet TAKE 1 TABLET BY MOUTH EVERY DAY  . cyclobenzaprine (FLEXERIL) 10 MG tablet Take 1 tablet (10 mg total) by mouth 3 (three) times daily as needed for muscle spasms.  . fluticasone (FLONASE) 50 MCG/ACT nasal spray Place 2 sprays into both nostrils daily as needed for allergies or rhinitis.  Marland Kitchen gabapentin (NEURONTIN) 100 MG capsule Take 1 capsule by mouth daily.  . Multiple Vitamin (MULTIVITAMIN WITH MINERALS) TABS tablet Take 1 tablet by mouth daily.  . nitroGLYCERIN (NITROSTAT) 0.4 MG SL tablet DISSOLVE 1 TABLET UNDER TONGUE EVERY 5 MINUTES UP TO 15 MIN FOR CHESTPAIN. IF NO RELIEF CALL 911.  . pantoprazole (PROTONIX) 40 MG tablet TAKE 1 TABLET BY MOUTH ONCE DAILY AT 6:00 AM.  . ranolazine (RANEXA) 500 MG 12 hr tablet Take 1 tablet (500 mg total) by mouth 2 (two) times daily.  Marland Kitchen tetrahydrozoline-zinc (VISINE-AC) 0.05-0.25 % ophthalmic solution Place 2 drops into both eyes 3 (three) times daily as needed (itching/burning).  . [DISCONTINUED] clopidogrel (PLAVIX) 75 MG tablet TAKE 1 TABLET BY MOUTH EVERY DAY  . [DISCONTINUED] furosemide (LASIX) 20 MG tablet TAKE 1 TABLET BY MOUTH EVERY DAY  . [DISCONTINUED] KLOR-CON M20 20 MEQ tablet TAKE 1 TABLET (20 MEQ TOTAL) BY MOUTH DAILY. PT NEEDS TO MAKE APPT WITH PROVIDER FOR MORE REFILLS  . [DISCONTINUED] metoprolol succinate (TOPROL-XL) 25 MG 24 hr tablet TAKE 1 TABLET BY MOUTH EVERY DAY WITH OR IMMEDIATELY AFTER A MEAL... NEEDS AN APPOINTMENT  . [DISCONTINUED] rosuvastatin (CRESTOR) 40 MG tablet Take 1 tablet (40 mg total) by mouth daily.     Allergies:   Sulfa antibiotics and Hydrocodone   Social History   Socioeconomic History  . Marital status: Married    Spouse name: Not on file  . Number of children: 3  . Years of education: Not on file  . Highest education level: Not on file  Occupational History   . Occupation: Unemployed HVAC  Tobacco Use  . Smoking status: Former Smoker    Packs/day: 3.00    Years: 40.00    Pack years: 120.00    Types: Cigarettes    Quit date: 01/23/2003    Years since quitting: 18.1  . Smokeless tobacco: Never Used  Substance and Sexual Activity  . 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 Drug use: No  . Sexual activity: Yes  Other Topics Concern  . Not on file  Social History Narrative  . Not on file   Social Determinants of Health   Financial Resource Strain: Not on file  Food Insecurity:  Not on file  Transportation Needs: Not on file  Physical Activity: Not on file  Stress: Not on file  Social Connections: Not on file     Family History: The patient's family history includes Cancer in his mother; Diabetes in his father; Heart disease in his mother; Heart disease (age of onset: 4737) in his brother; Hyperlipidemia in his father; Hypertension in his father; Stomach cancer in his father.    ROS:   Please see the history of present illness.    All other systems reviewed and are negative.   EKGs/Labs/Other Studies Reviewed:    The following studies were reviewed today:  Echo 12/2017 Study Conclusions   - Left ventricle: The cavity size was normal. Wall thickness was  normal. Systolic function was normal. The estimated ejection  fraction was in the range of 55% to 60%. Wall motion was normal;  there were no regional wall motion abnormalities. Left  ventricular diastolic function parameters were normal.  - Aortic valve: Valve area (VTI): 2.55 cm^2. Valve area (Vmax): 2.5  cm^2. Valve area (Vmean): 1.85 cm^2.  - Mitral valve: There was mild regurgitation. The MR is eccentric  and posteriorally direceted, may be underestimated. The MV/AV VTI  ratio is 1.1, suggesting mild to moderate MR Valve area by  continuity equation (using LVOT flow): 2.27 cm^2.   Left Heart  Cath and Coronary Angiography   05/2015    Conclusion   The left ventricular systolic function is normal.  Ost 2nd Mrg lesion, 80% stenosed.  Ramus lesion, 50% stenosed.  Ost LAD to Prox LAD lesion, 100% stenosed.  LIMA was injected is normal in caliber, and is anatomically normal.  SVG was injected .  There is severe disease in the graft.  Graft is chronically occluded.  SVG was injected .  There is severe disease in the graft.  The graft is chronically occluded.  Origin to Prox Graft lesion, 100% stenosed.  Origin lesion, 100% stenosed.  Dist RCA lesion, 90% stenosed.  SVG was injected .  There is severe disease in the graft.  The graft is occluded.  Origin lesion, 100% stenosed.  Prox RCA lesion, 30% stenosed.   1. 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 The vein graft to the PDA is occluded since last cath.  2. Occluded proximal LAD with filling of the mid and distal vessel by the patent LIMA graft. Occluded SVG to Diagonal.  3. Moderate disease in the moderate caliber intermediate branch, unchanged from last cath. Occluded SVG to OM 4. Mild disease in the AV groove Circumflex 5. Severe stenosis in the small caliber posterolateral branch at the site of the insertion of the vein graft. PCI of this vessel was attempted in 2014 but was not successful. SVG to PDA is now occluded.  6. Normal LV systolic function 7. Moderate mitral regurgitation   EKG:  EKG is ordered today.  The ekg ordered today demonstrates sinus bradycardia at rate of 52 bpm  Recent Labs: 02/22/2021: ALT 20; BUN 15; Creatinine, Ser 1.21; Hemoglobin 14.6; Platelets 187; Potassium 4.6; Sodium 143; TSH 3.960  Recent Lipid Panel    Component Value Date/Time   CHOL 186 10/10/2020 1054   TRIG 246 (H) 10/10/2020 1054   HDL 43 10/10/2020 1054   CHOLHDL 4.3 10/10/2020 1054   CHOLHDL 3.9 02/22/2016 0844   VLDL 40 (H) 02/22/2016 0844   LDLCALC 101 (H) 10/10/2020 1054      Physical Exam:    VS:  BP 110/78   Pulse (!) 53   Ht 5\' 6"  (1.676 m)   Wt 197 lb 3.2 oz (89.4 kg)   SpO2 97%   BMI 31.83 kg/m     Wt Readings from Last 3 Encounters:  03/14/21 197 lb 3.2 oz (89.4 kg)  02/22/21 198 lb (89.8 kg)  03/16/20 192 lb 1.9 oz (87.1 kg)     GEN: Well nourished, well developed in no acute distress HEENT: Normal NECK: No JVD; No carotid bruits LYMPHATICS: No lymphadenopathy CARDIAC: RRR, + murmurs, rubs, gallops RESPIRATORY:  Clear to auscultation without rales, wheezing or rhonchi  ABDOMEN: Soft, non-tender, non-distended MUSCULOSKELETAL:  No edema; No deformity  SKIN: Warm and dry NEUROLOGIC:  Alert and oriented x 3 PSYCHIATRIC:  Normal affect   ASSESSMENT AND PLAN:    1. Dyspnea on exertion Patient reports shortness of breath while walking without associated chest tightness, diaphoresis or nausea.  He noticed more short of breath while going uphill.  This is stable for past 6 months.  Somewhat similar to prior angina but lacking chest tightness.  Etiology could include angina, worsening mitral regurgitation or sinus bradycardia.  I have offered exercise Myoview as a starting point however patient wants to monitor his symptoms currently.  He will give 03/18/20 call if worsening shortness of breath.  I do not think bradycardia or mitral disease contributing to his symptoms.  Reviewed recent blood work done by PCP.  2. CAD s/p CABG and PCIs - Last cath in 2016 as above>> medical management -Continue dual antiplatelet therapy, statin, Toprol-XL and Ranexa.  3. Mitral regurgitation - Echo in 12/2017 with mild to moderate MR Valve area by  continuity equation (using LVOT flow): 2.27 cm^2.  -As above  4.  Hyperlipidemia -Continue statin  5. Chronic diastolic CHF - Euvolemic - Continue Lasix  Medication Adjustments/Labs and Tests Ordered: Current medicines are reviewed at length with the patient today.  Concerns regarding medicines are outlined  above.  Orders Placed This Encounter  Procedures  . EKG 12-Lead   Meds ordered this encounter  Medications  . clopidogrel (PLAVIX) 75 MG tablet    Sig: Take 1 tablet (75 mg total) by mouth daily.    Dispense:  90 tablet    Refill:  3  . furosemide (LASIX) 20 MG tablet    Sig: Take 1 tablet (20 mg total) by mouth daily.    Dispense:  90 tablet    Refill:  3  . potassium chloride SA (KLOR-CON M20) 20 MEQ tablet    Sig: TAKE 1 TABLET (20 MEQ TOTAL) BY MOUTH DAILY.    Dispense:  90 tablet    Refill:  3  . rosuvastatin (CRESTOR) 40 MG tablet    Sig: Take 1 tablet (40 mg total) by mouth daily.    Dispense:  90 tablet    Refill:  3    Stop pravastatin  . metoprolol succinate (TOPROL-XL) 25 MG 24 hr tablet    Sig: TAKE 1 TABLET BY MOUTH EVERY DAY WITH OR IMMEDIATELY AFTER A MEAL...    Dispense:  90 tablet    Refill:  3    Patient Instructions  Medication Instructions:  Your physician recommends that you continue on your current medications as directed. Please refer to the Current Medication list given to you today.  *If you need a refill on your cardiac medications before your next appointment, please call your pharmacy*   Lab Work: None ordered  If you have labs (blood work) drawn  today and your tests are completely normal, you will receive your results only by: Marland Kitchen MyChart Message (if you have MyChart) OR . A paper copy in the mail If you have any lab test that is abnormal or we need to change your treatment, we will call you to review the results.   Testing/Procedures: None ordered   Follow-Up: At [facility], you and your health needs are our priority.  As part of our continuing mission to provide you with exceptional heart care, we have created designated Provider Care Teams.  These Care Teams include your primary Cardiologist (physician) and Advanced Practice Providers (APPs -  Physician Assistants and Nurse Practitioners) who all work together to provide you with  the care you need, when you need it.  We recommend signing up for the patient portal called "MyChart".  Sign up information is provided on this After Visit Summary.  MyChart is used to connect with patients for Virtual Visits (Telemedicine).  Patients are able to view lab/test results, encounter notes, upcoming appointments, etc.  Non-urgent messages can be sent to your provider as well.   To learn more about what you can do with MyChart, go to [email or web address].    Your next appointment:   3 month(s)  The format for your next appointment:   In Person  Provider:   Verne Carrow, MD   Other Instructions      Signed, Manson Passey, PA  03/14/2021 8:56 AM    [facility]

## 2021-03-14 NOTE — Patient Instructions (Signed)
Medication Instructions:  Your physician recommends that you continue on your current medications as directed. Please refer to the Current Medication list given to you today.  *If you need a refill on your cardiac medications before your next appointment, please call your pharmacy*   Lab Work: None ordered  If you have labs (blood work) drawn today and your tests are completely normal, you will receive your results only by: . MyChart Message (if you have MyChart) OR . A paper copy in the mail If you have any lab test that is abnormal or we need to change your treatment, we will call you to review the results.   Testing/Procedures: None ordered   Follow-Up: At [facility], you and your health needs are our priority.  As part of our continuing mission to provide you with exceptional heart care, we have created designated Provider Care Teams.  These Care Teams include your primary Cardiologist (physician) and Advanced Practice Providers (APPs -  Physician Assistants and Nurse Practitioners) who all work together to provide you with the care you need, when you need it.  We recommend signing up for the patient portal called "MyChart".  Sign up information is provided on this After Visit Summary.  MyChart is used to connect with patients for Virtual Visits (Telemedicine).  Patients are able to view lab/test results, encounter notes, upcoming appointments, etc.  Non-urgent messages can be sent to your provider as well.   To learn more about what you can do with MyChart, go to [email or web address].    Your next appointment:   3 month(s)  The format for your next appointment:   In Person  Provider:   Christopher McAlhany, MD   Other Instructions   

## 2021-04-16 ENCOUNTER — Other Ambulatory Visit: Payer: Self-pay | Admitting: Cardiovascular Disease

## 2021-05-23 NOTE — Progress Notes (Deleted)
Cardiology Office Note:    Date:  05/23/2021   ID:  Frank [name], DOB May 26, 1956, MRN [id number]  PCP:  Cora Collum, DO   [facility] Providers Cardiologist:  Verne Carrow, MD { Click to update primary MD,subspecialty MD or APP then REFRESH:1}  ***  Referring MD: Cora Collum, DO   Chief Complaint:  No chief complaint on file.    Patient Profile:    Frank [name] is a 65 y.o. male with:  Coronary artery disease  S/p CABG in 2004 S/p DES x 2 to S-PDA in 7/13 (L-LAD ok, S-OM, S-PLA, S-Dx all occluded) S/p DES to S-PDA in 12/14 Cath 7/16: S-PDA 100; all other VGs occluded; L-LAD patent >> no targets for PCI; Med Rx (HFpEF) heart failure with preserved ejection fraction  Echocardiogram 2/19: EF 55-60 Hypertension  Hyperlipidemia  Mitral regurgitation (mild on echocardiogram in 2/19)   Prior CV studies: Echocardiogram 12/30/17 - Left ventricle: The cavity size was normal. Wall thickness was    normal. Systolic function was normal. The estimated ejection    fraction was in the range of 55% to 60%. Wall motion was normal;    there were no regional wall motion abnormalities. Left    ventricular diastolic function parameters were normal.  - Aortic valve: Valve area (VTI): 2.55 cm^2. Valve area (Vmax): 2.5    cm^2. Valve area (Vmean): 1.85 cm^2.  - Mitral valve: There was mild regurgitation. The MR is eccentric    and posteriorally direceted, may be underestimated. The MV/AV VTI    ratio is 1.1, suggesting mild to moderate MR Valve area by    continuity equation (using LVOT flow): 2.27 cm^2.   ABIs 05/13/17 Normal  LEFT HEART CATH AND CORONARY ANGIOGRAPHY 05/27/2015 1. 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 The vein graft to the PDA is occluded since last cath. 2. Occluded proximal LAD with filling of the mid and distal vessel by the patent LIMA graft. Occluded SVG to Diagonal. 3. Moderate disease in the moderate caliber  intermediate branch, unchanged from last cath. Occluded SVG to OM 4. Mild disease in the AV groove Circumflex 5. Severe stenosis in the small caliber posterolateral branch at the site of the insertion of the vein graft. PCI of this vessel was attempted in 2014 but was not successful. SVG to PDA is now occluded. 6. Normal LV systolic function 7. Moderate mitral regurgitation  History of Present Illness: Frank [name] was last seen in 4/22 by Manson Passey, PA-C.  He had symptoms of dyspnea on exertion but no chest pain.  Proceeding with Myoview was discussed but the pt opted to hold off and monitor symptoms.          Past Medical History:  Diagnosis Date   Anxiety    Arthritis    hands, shoulders, arms   CAD (coronary artery disease)    5V CABG in 2004; stent to RCA x1 and SVG to acute marginal x2 2011;  PTCA/DES x 2 body of SVG to PDA  05/20/12   CHF (congestive heart failure) (HCC)     EF 55% by echo 03/27/2012   Chronic lower back pain    "w/activity"   Depression    GERD (gastroesophageal reflux disease)    HLD (hyperlipidemia)    Hypertension    Low HDL (under 40)    Mitral regurgitation    Moderate by TEE 03/27/12   Myocardial infarction (HCC) 2004   Numbness and  tingling in hands    Numbness and tingling of both legs    Umbilical hernia    unrepaired (05/20/12)    Current Medications: No outpatient medications have been marked as taking for the 05/24/21 encounter (Appointment) with Tereso Newcomer T, PA-C.     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  ROS   EKGs/Labs/Other Test Reviewed:    EKG:  EKG is *** ordered today.  The ekg ordered today demonstrates ***  Recent Labs: 02/22/2021: ALT 20; BUN 15; Creatinine, Ser 1.21; Hemoglobin 14.6; Platelets 187; Potassium 4.6; Sodium 143; TSH 3.960   Recent Lipid Panel Lab Results  Component Value Date/Time   CHOL 186 10/10/2020 10:54 AM   TRIG 246 (H) 10/10/2020 10:54 AM   HDL 43 10/10/2020 10:54 AM   LDLCALC 101 (H) 10/10/2020 10:54 AM      Risk Assessment/Calculations:   {Does this patient have ATRIAL FIBRILLATION?:[id number]}  Physical Exam:    VS:  There were no vitals taken for this visit.    Wt Readings from Last 3 Encounters:  03/14/21 197 lb 3.2 oz (89.4 kg)  02/22/21 198 lb (89.8 kg)  03/16/20 192 lb 1.9 oz (87.1 kg)     Physical Exam ***     ASSESSMENT & PLAN:    ***    {Are you ordering a CV Procedure (e.g. stress test, cath, DCCV, TEE, etc)?   Press F2        :[id number]}    Dispo:  No follow-ups on file.   Medication Adjustments/Labs and Tests Ordered: Current medicines are reviewed at length with the patient today.  Concerns regarding medicines are outlined above.  Tests Ordered: No orders of the defined types were placed in this encounter.  Medication Changes: No orders of the defined types were placed in this encounter.   Signed, Tereso Newcomer, PA-C  05/23/2021 9:59 PM    [facility] [address] [place], [place], [place]  [address] Phone: [phone]; Fax: [phone]

## 2021-05-24 ENCOUNTER — Ambulatory Visit: Payer: Medicare Other | Admitting: Physician Assistant

## 2021-06-19 ENCOUNTER — Telehealth: Payer: Self-pay

## 2021-06-19 NOTE — Telephone Encounter (Signed)
Tried to call patient to schedule AWV. Not able to leave a message at this time.

## 2021-07-13 NOTE — Telephone Encounter (Signed)
Tried to contact pt again. Unable to leave a message.

## 2021-07-16 ENCOUNTER — Other Ambulatory Visit: Payer: Self-pay | Admitting: Family Medicine

## 2021-07-19 DIAGNOSIS — Z1212 Encounter for screening for malignant neoplasm of rectum: Secondary | ICD-10-CM | POA: Diagnosis not present

## 2021-07-19 DIAGNOSIS — Z1211 Encounter for screening for malignant neoplasm of colon: Secondary | ICD-10-CM | POA: Diagnosis not present

## 2021-07-26 LAB — COLOGUARD: Cologuard: NEGATIVE

## 2021-07-27 LAB — COLOGUARD: COLOGUARD: NEGATIVE

## 2022-02-27 ENCOUNTER — Encounter: Payer: Medicare Other | Admitting: Family Medicine

## 2022-03-21 ENCOUNTER — Other Ambulatory Visit: Payer: Self-pay | Admitting: Physician Assistant

## 2022-03-21 ENCOUNTER — Other Ambulatory Visit: Payer: Self-pay | Admitting: Cardiovascular Disease

## 2022-04-04 DIAGNOSIS — I10 Essential (primary) hypertension: Secondary | ICD-10-CM | POA: Diagnosis not present

## 2022-04-04 DIAGNOSIS — H5200 Hypermetropia, unspecified eye: Secondary | ICD-10-CM | POA: Diagnosis not present

## 2022-04-04 DIAGNOSIS — Z0100 Encounter for examination of eyes and vision without abnormal findings: Secondary | ICD-10-CM | POA: Diagnosis not present

## 2022-04-04 DIAGNOSIS — E7800 Pure hypercholesterolemia, unspecified: Secondary | ICD-10-CM | POA: Diagnosis not present

## 2022-04-05 ENCOUNTER — Encounter: Payer: Medicare Other | Admitting: Family Medicine

## 2022-04-15 ENCOUNTER — Emergency Department (HOSPITAL_COMMUNITY)
Admission: EM | Admit: 2022-04-15 | Discharge: 2022-04-15 | Disposition: A | Discharge status: Home / Self Care | Payer: No Typology Code available for payment source | Attending: Emergency Medicine | Admitting: Emergency Medicine

## 2022-04-15 ENCOUNTER — Emergency Department (HOSPITAL_COMMUNITY): Payer: No Typology Code available for payment source

## 2022-04-15 ENCOUNTER — Encounter (HOSPITAL_COMMUNITY): Payer: Self-pay

## 2022-04-15 ENCOUNTER — Other Ambulatory Visit: Payer: Self-pay

## 2022-04-15 DIAGNOSIS — I2510 Atherosclerotic heart disease of native coronary artery without angina pectoris: Secondary | ICD-10-CM | POA: Diagnosis not present

## 2022-04-15 DIAGNOSIS — M542 Cervicalgia: Secondary | ICD-10-CM | POA: Insufficient documentation

## 2022-04-15 DIAGNOSIS — R519 Headache, unspecified: Secondary | ICD-10-CM | POA: Insufficient documentation

## 2022-04-15 DIAGNOSIS — Z79899 Other long term (current) drug therapy: Secondary | ICD-10-CM | POA: Insufficient documentation

## 2022-04-15 DIAGNOSIS — I110 Hypertensive heart disease with heart failure: Secondary | ICD-10-CM | POA: Diagnosis not present

## 2022-04-15 DIAGNOSIS — M545 Low back pain, unspecified: Secondary | ICD-10-CM | POA: Diagnosis not present

## 2022-04-15 DIAGNOSIS — R079 Chest pain, unspecified: Secondary | ICD-10-CM | POA: Diagnosis not present

## 2022-04-15 DIAGNOSIS — I509 Heart failure, unspecified: Secondary | ICD-10-CM | POA: Diagnosis not present

## 2022-04-15 DIAGNOSIS — Z7982 Long term (current) use of aspirin: Secondary | ICD-10-CM | POA: Diagnosis not present

## 2022-04-15 DIAGNOSIS — I10 Essential (primary) hypertension: Secondary | ICD-10-CM | POA: Diagnosis not present

## 2022-04-15 LAB — BASIC METABOLIC PANEL
Anion gap: 6 (ref 5–15)
BUN: 14 mg/dL (ref 8–23)
CO2: 23 mmol/L (ref 22–32)
Calcium: 8.6 mg/dL — ABNORMAL LOW (ref 8.9–10.3)
Chloride: 108 mmol/L (ref 98–111)
Creatinine, Ser: 1.14 mg/dL (ref 0.61–1.24)
GFR, Estimated: >60 mL/min (ref >60)
Glucose, Bld: 129 mg/dL — ABNORMAL HIGH (ref 70–99)
Potassium: 4 mmol/L (ref 3.5–5.1)
Sodium: 137 mmol/L (ref 135–145)

## 2022-04-15 LAB — TROPONIN I (HIGH SENSITIVITY)
Troponin I (High Sensitivity): 2 ng/L (ref <18)
Troponin I (High Sensitivity): 3 ng/L (ref <18)

## 2022-04-15 LAB — CBC
HCT: 43.4 % (ref 39.0–52.0)
Hemoglobin: 14.5 g/dL (ref 13.0–17.0)
MCH: 29.9 pg (ref 26.0–34.0)
MCHC: 33.4 g/dL (ref 30.0–36.0)
MCV: 89.5 fL (ref 80.0–100.0)
Platelets: 181 10*3/uL (ref 150–400)
RBC: 4.85 MIL/uL (ref 4.22–5.81)
RDW: 12.6 % (ref 11.5–15.5)
WBC: 10.5 10*3/uL (ref 4.0–10.5)
nRBC: 0 % (ref 0.0–0.2)

## 2022-04-15 LAB — CBG MONITORING, ED: Glucose-Capillary: 124 mg/dL — ABNORMAL HIGH (ref 70–99)

## 2022-04-15 LAB — MAGNESIUM: Magnesium: 1.9 mg/dL (ref 1.7–2.4)

## 2022-04-15 IMAGING — CT CT HEAD W/O CM
4 series · 15 of 47 positions shown, 17 images · non-contrast
Comparison: Head CT [DATE]

CLINICAL DATA: Headache and neck stiffness.



[Series 2: head w o · axial · 0.42mm/px · z∈[+46,+166]mm · 7 of 32 slices shown, 9 images]
[im 4/32  brain]
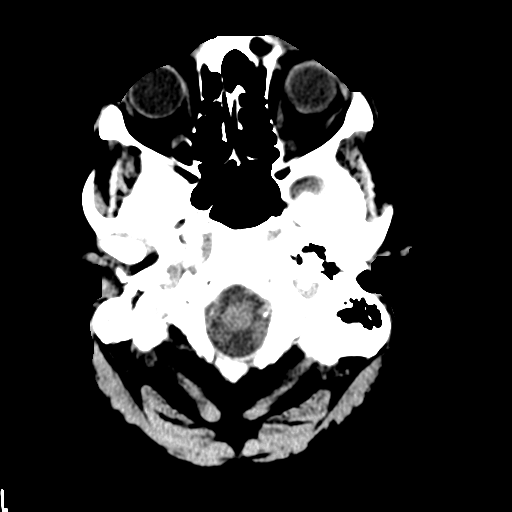
[im 4/32  bone]
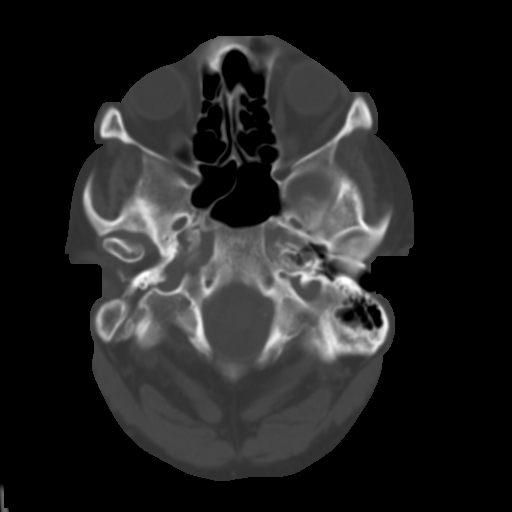
[im 8/32  brain]
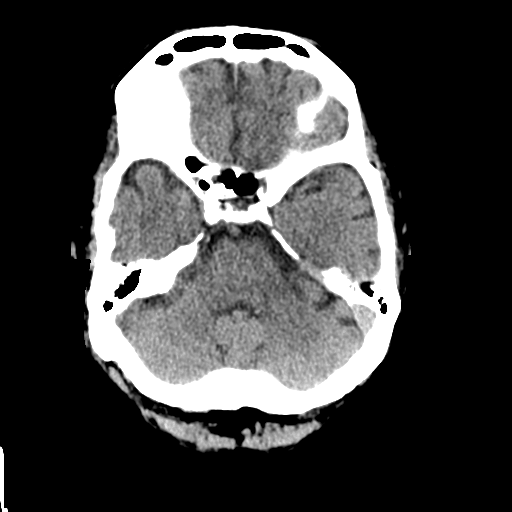
[im 12/32  brain]
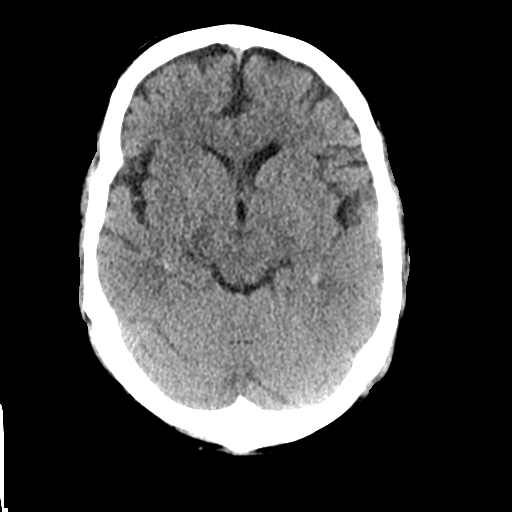
[im 16/32  brain]
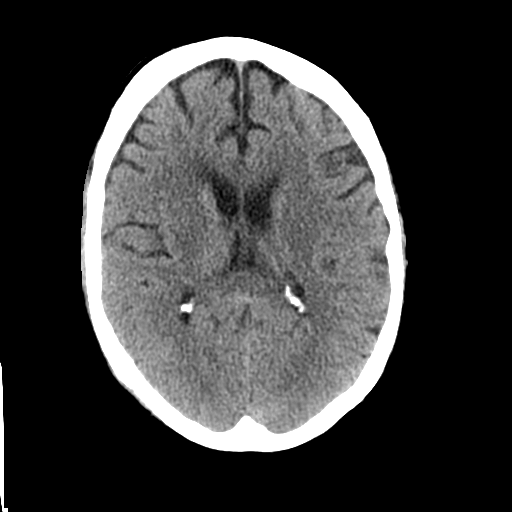
[im 20/32  brain]
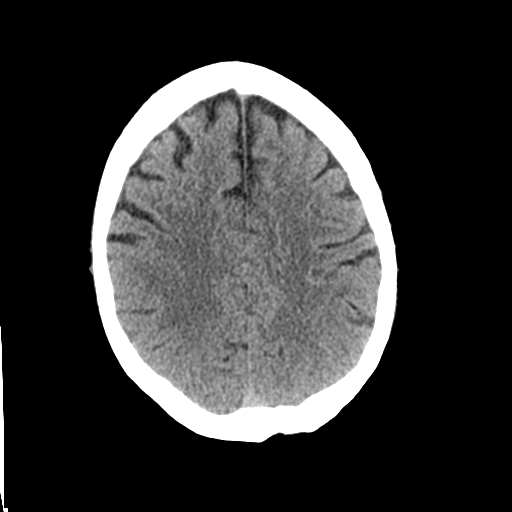
[im 20/32  bone]
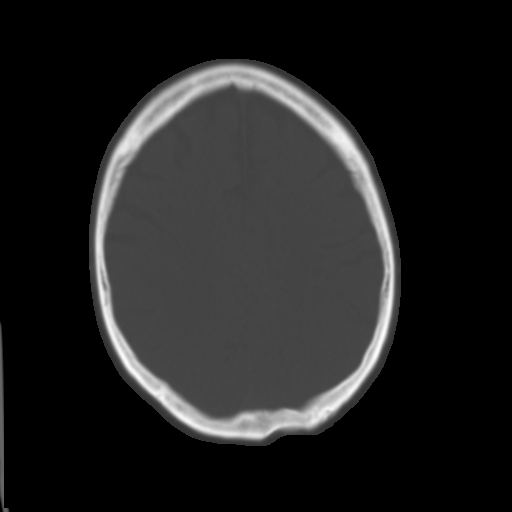
[im 24/32  brain]
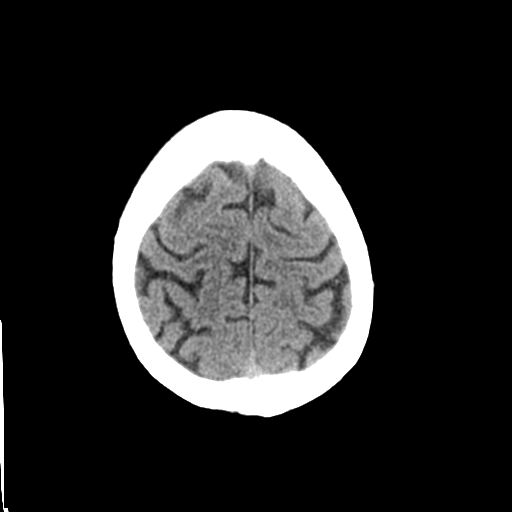
[im 28/32  brain]
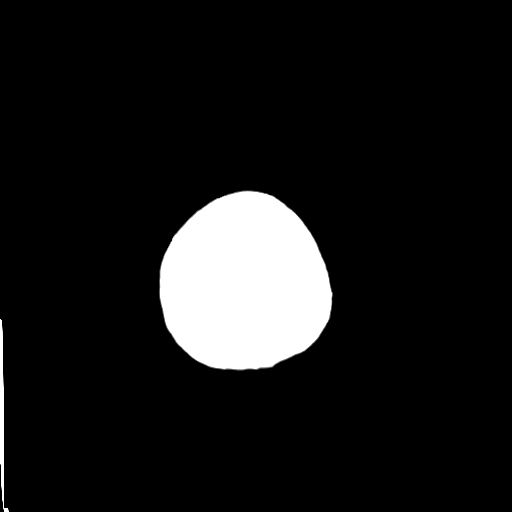

[Series 3: head bone · axial · 0.42mm/px · z∈[+45,+61]mm · 2 of 80 slices shown]
[im 8/80  bone]
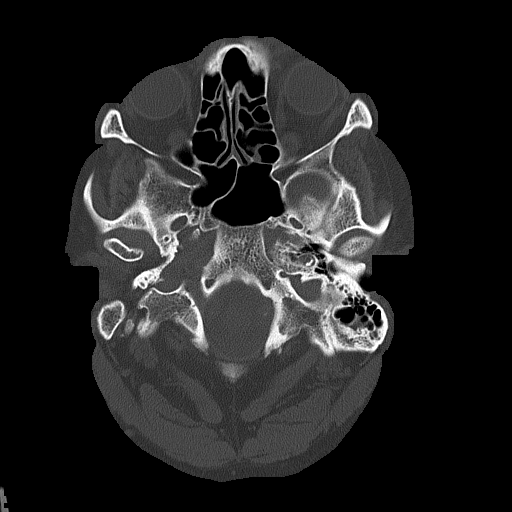
[im 16/80  bone]
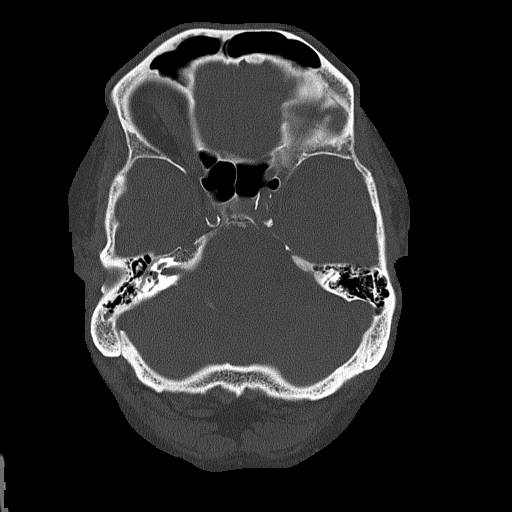

[Series 4: coronal soft · coronal · 0.31mm/px · 3 of 70 slices shown]
[im 24/70  brain]
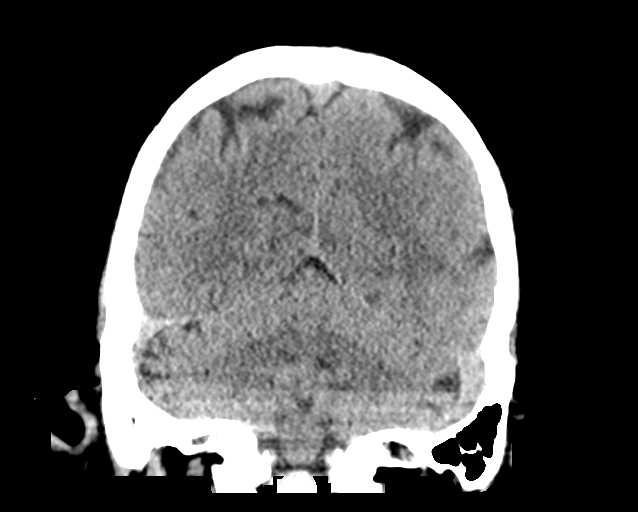
[im 31/70  brain]
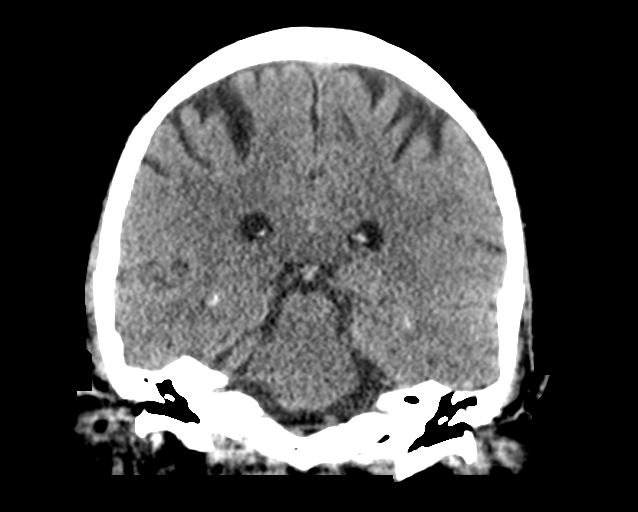
[im 39/70  brain]
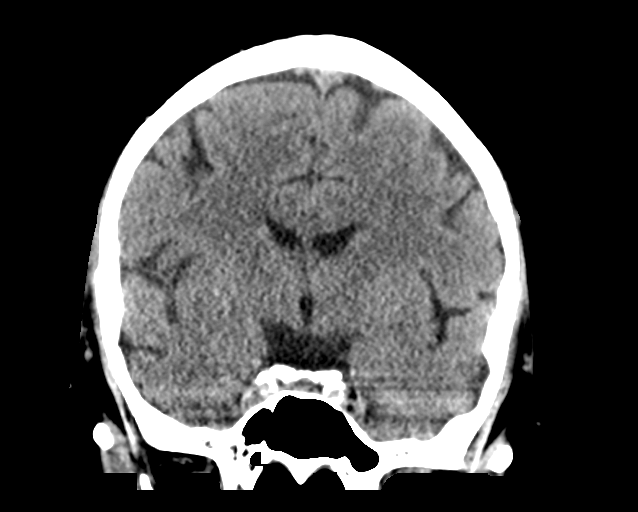

[Series 5: sagittal soft · sagittal · 0.34mm/px · 3 of 53 slices shown]
[im 18/53  brain]
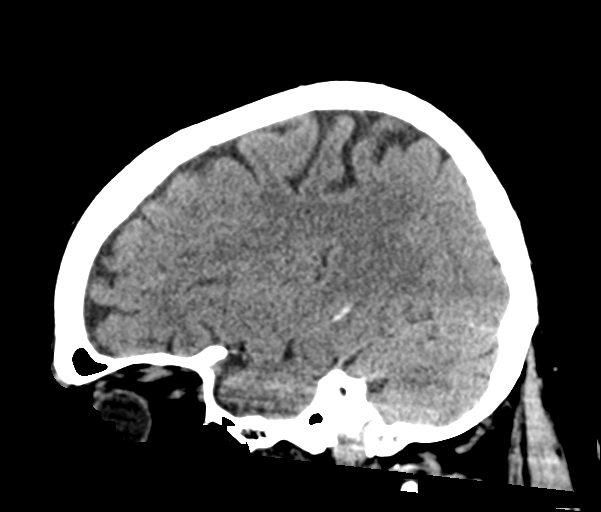
[im 27/53  brain]
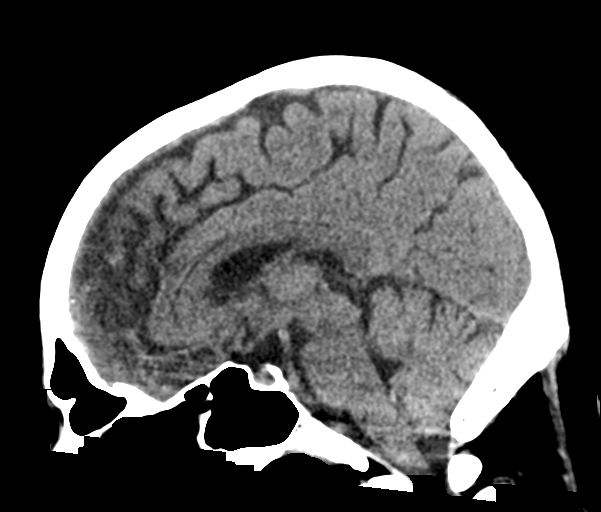
[im 35/53  brain]
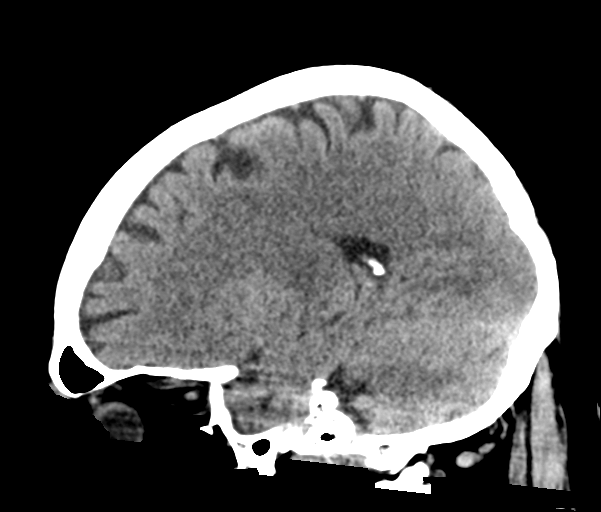

[15 of 47 positions shown; findings below may reference images not displayed]

FINDINGS: CT HEAD FINDINGS

Brain: There is no evidence for acute hemorrhage, hydrocephalus,
mass lesion, or abnormal extra-axial fluid collection. Focal hypo
attenuation along the right anterior limb internal capsule/caudate
is new in the interval, likely related to nonacute infarct head.

Vascular: No hyperdense vessel or unexpected calcification.

Skull: No evidence for fracture. No worrisome lytic or sclerotic
lesion.

Sinuses/Orbits: The visualized paranasal sinuses and mastoid air
cells are clear. Visualized portions of the globes and intraorbital
fat are unremarkable.

Other: None.

CT CERVICAL SPINE FINDINGS

Alignment: Straightening of normal cervical lordosis. No evidence
for traumatic subluxation. Degenerative mild retrolisthesis of C3 on
4 evident.

Skull base and vertebrae: No acute fracture. No primary bone lesion
or focal pathologic process.

Soft tissues and spinal canal: Patient is noted to have prevertebral
soft tissue edema of indeterminate etiology.

Disc levels: Loss of disc height noted C3-4 with endplate
degeneration.

Upper chest: Unremarkable.

Other: None.
IMPRESSION: 1. Focal hypo attenuation in the right anterior limb internal
capsule/caudate is new in the interval, likely related to nonacute
infarct. MRI brain could be used to further evaluate as clinically
warranted.
2. No evidence for cervical spine fracture or traumatic subluxation.
3. Prevertebral soft tissue edema in the upper and mid cervical
region of indeterminate etiology. This can be seen in the setting of
cervical spine injury, neoplasm, or infection. No gas evident within
the prevertebral soft tissues and no rim enhancing fluid collection
to suggest the presence of an abscess.
4. Degenerative disc disease at C3-4.
5. Loss of normal cervical lordosis, which can be related to patient
positioning, muscle spasm, or soft tissue injury.

## 2022-04-15 IMAGING — DX DG CHEST 1V
1 series · 1 of 1 positions shown · non-contrast
Comparison: [DATE] and prior studies

CLINICAL DATA: Chest pain for 2 days

EXAM:
CHEST  1 VIEW

[chest ap]
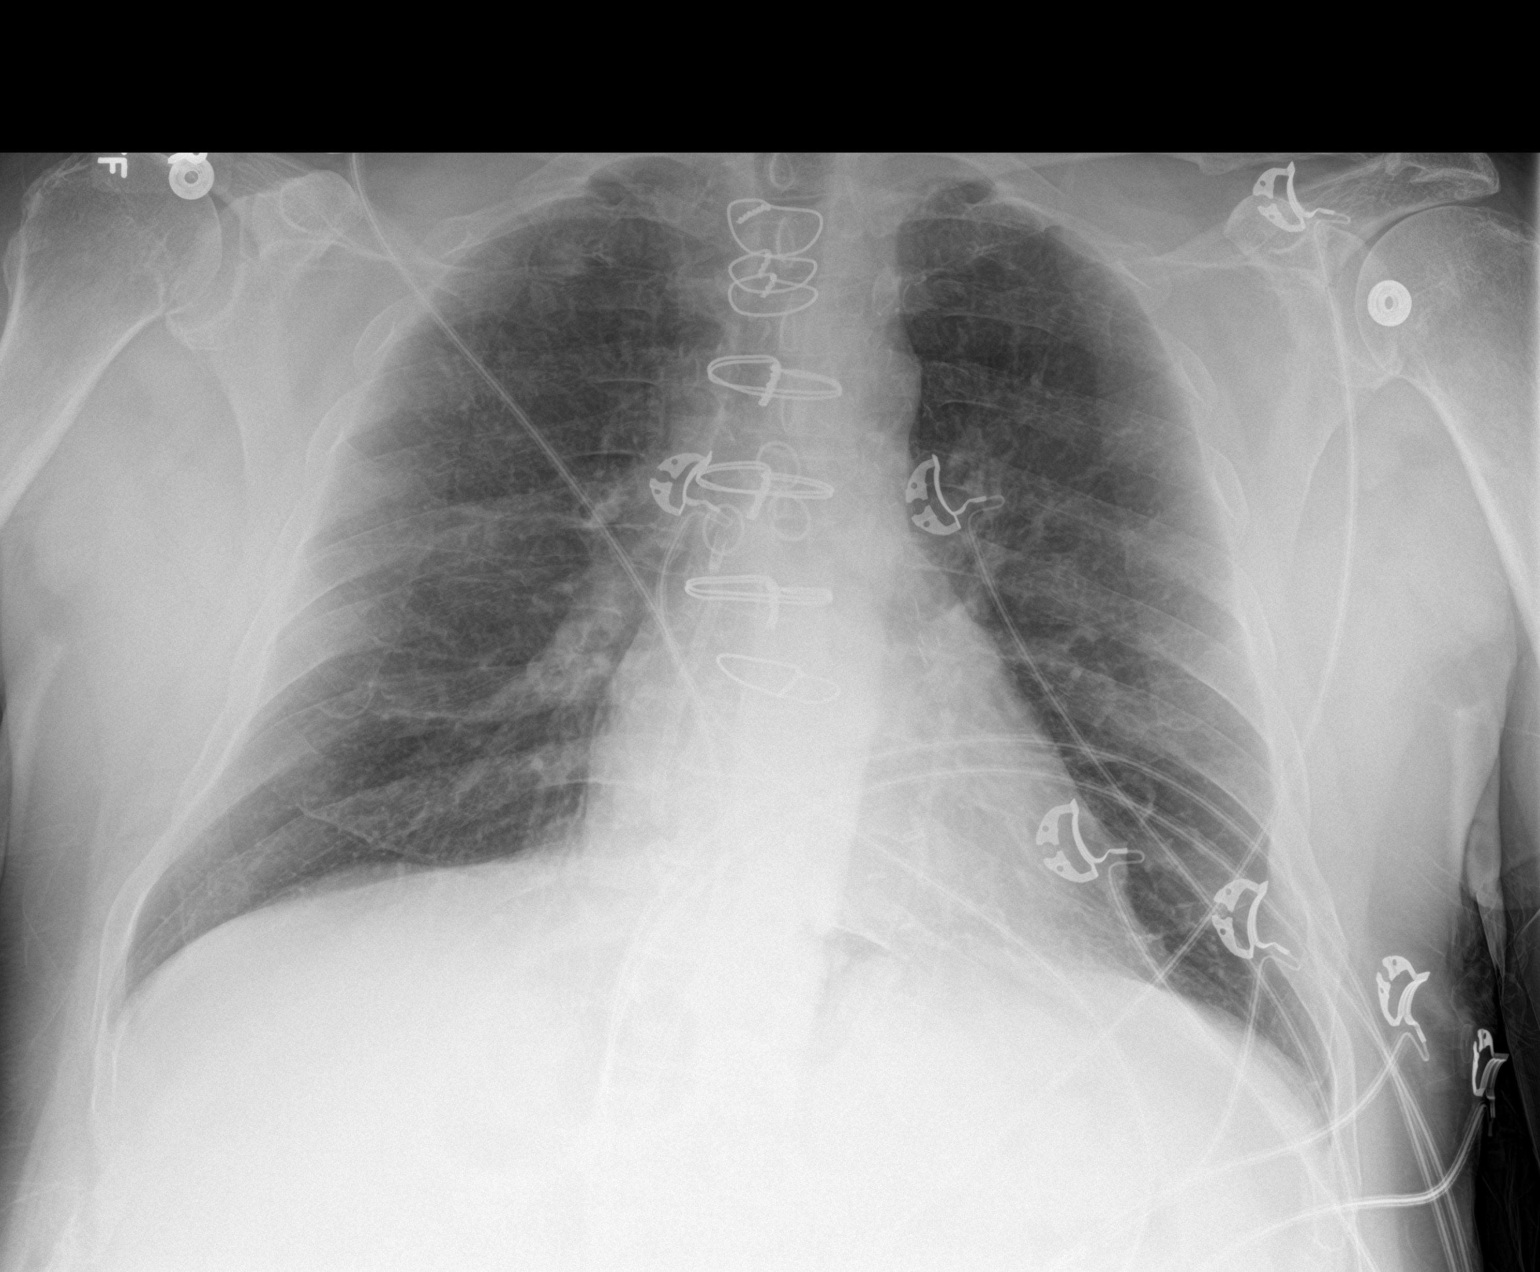

[1 of 1 positions shown; findings below may reference images not displayed]

FINDINGS: The cardiomediastinal silhouette is unremarkable.

CABG changes again identified.

There is no evidence of focal airspace disease, pulmonary edema,
suspicious pulmonary nodule/mass, pleural effusion, or pneumothorax.

No acute bony abnormalities are identified.
IMPRESSION: No active disease.

## 2022-04-15 IMAGING — CT CT CERVICAL SPINE W/O CM
3 of 4 series · 13 of 33 positions shown, 16 images · non-contrast
Comparison: Head CT [DATE]

CLINICAL DATA: Headache and neck stiffness.



[Series 5: sagittal bone · sagittal · 0.31mm/px · 5 of 57 slices shown, 6 images]
[im 19/57  bone]
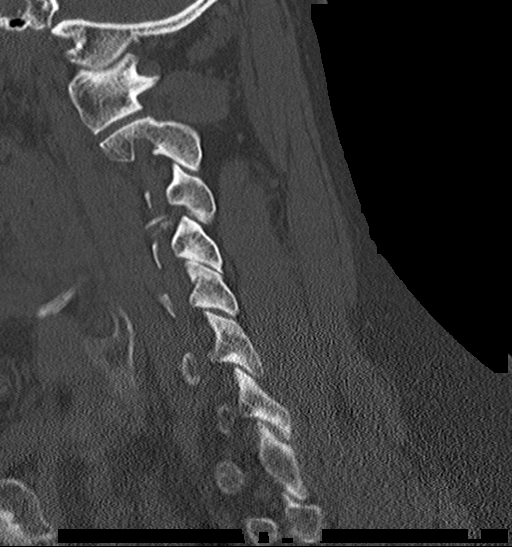
[im 24/57  bone]
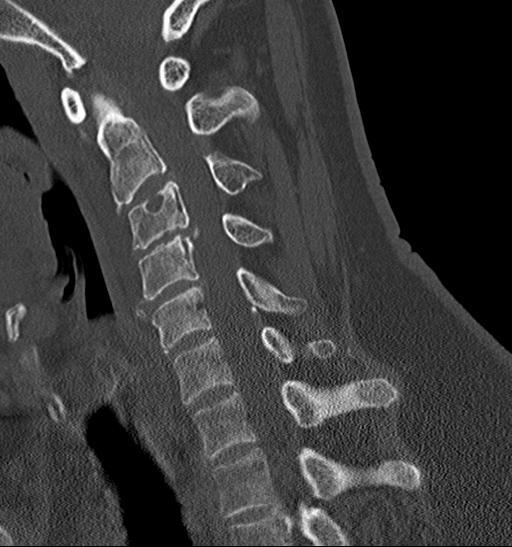
[im 29/57  soft-tissue]
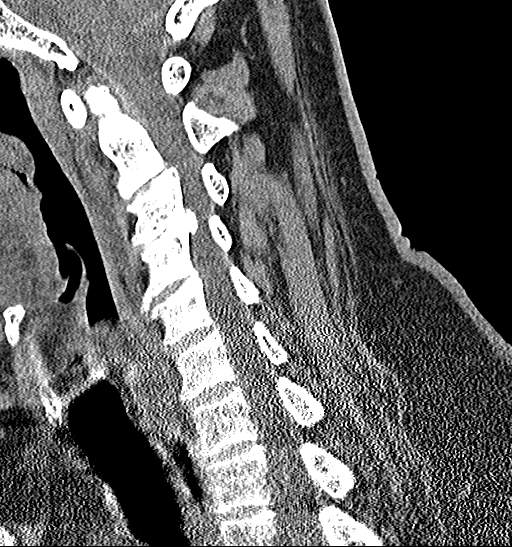
[im 29/57  bone]
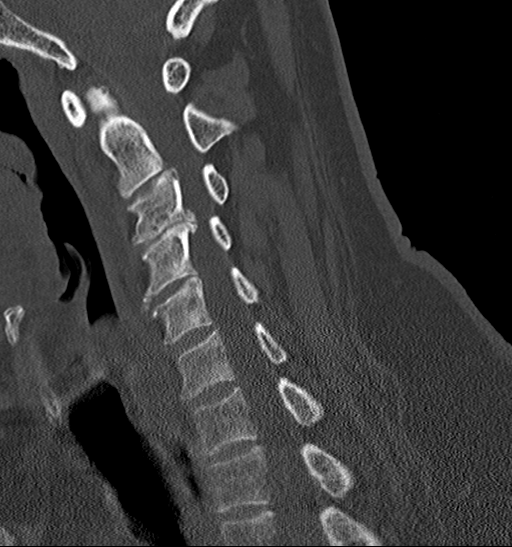
[im 33/57  bone]
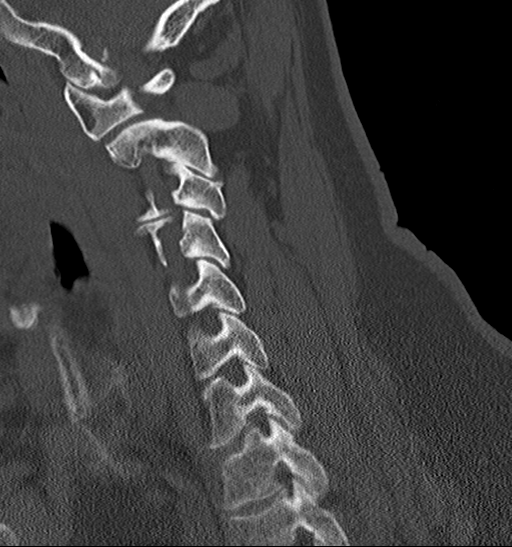
[im 38/57  bone]
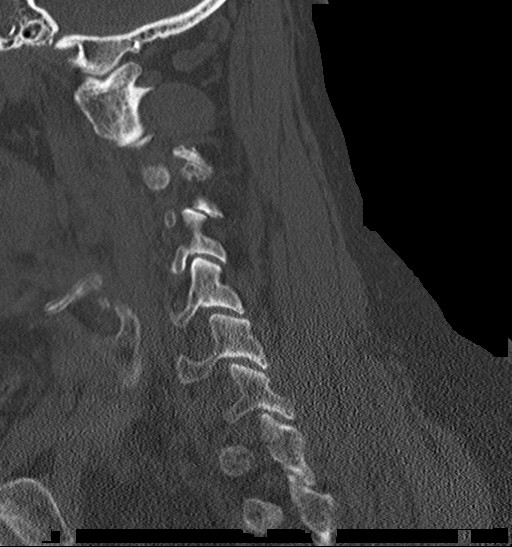

[Series 6: coronal bone · coronal · 0.25mm/px · 3 of 61 slices shown]
[im 13/61  bone]
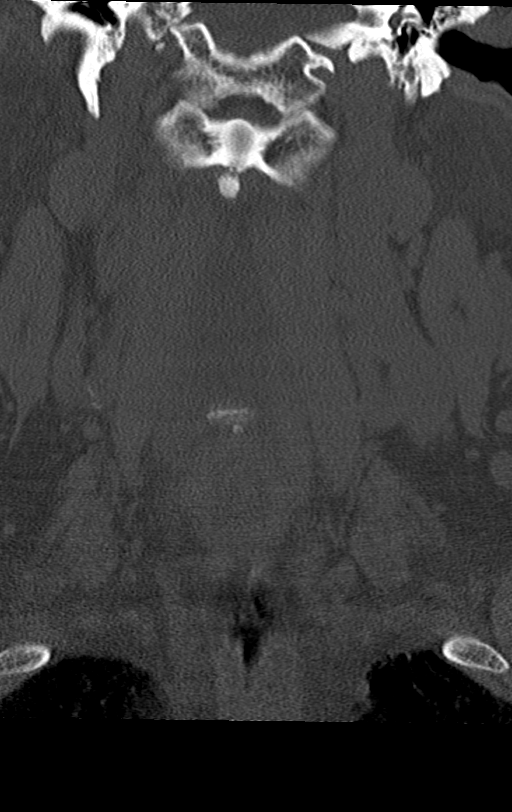
[im 25/61  bone]
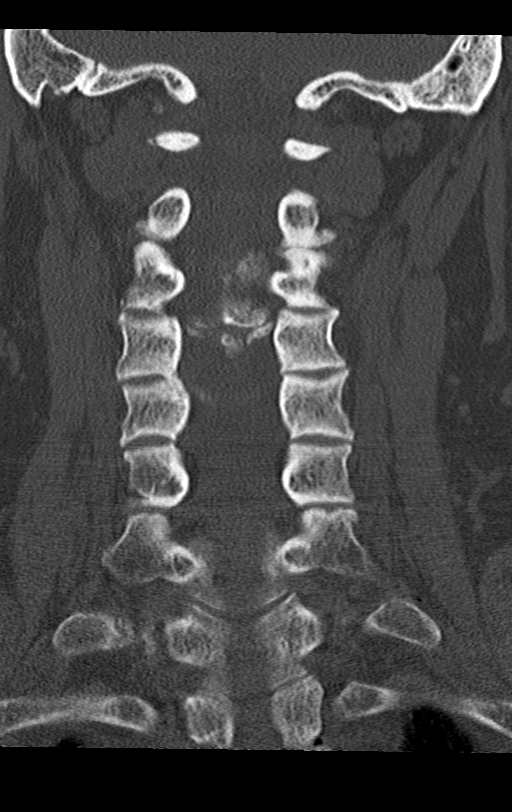
[im 37/61  bone]
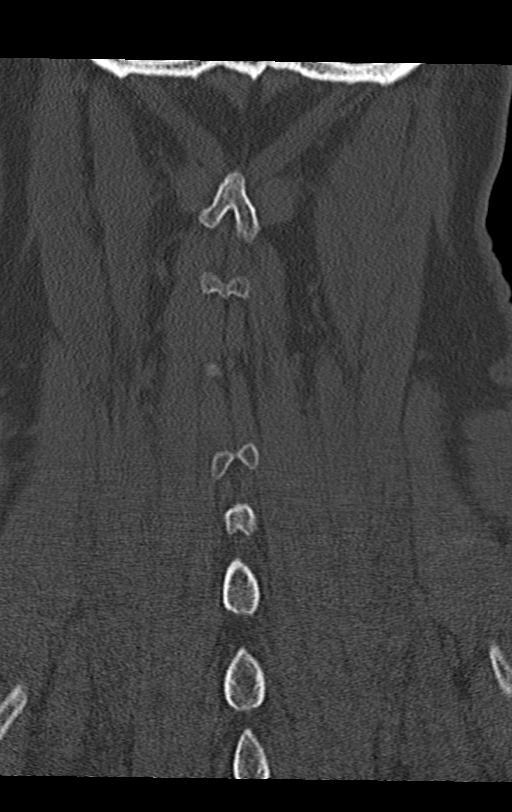

[Series 7: orthogonal axials · axial · 0.21mm/px · z∈[-109,+5]mm · 5 of 91 slices shown, 7 images]
[im 16/91  soft-tissue]
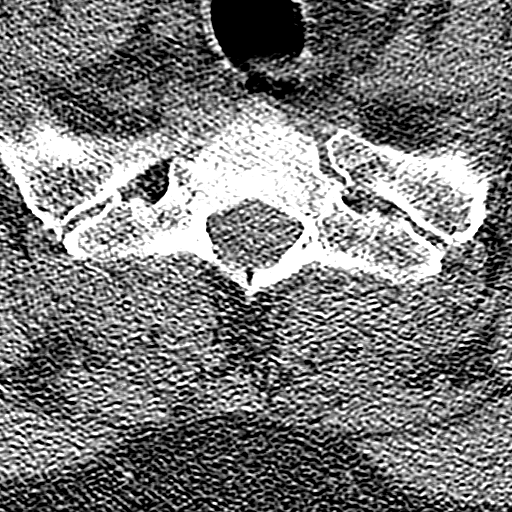
[im 16/91  bone]
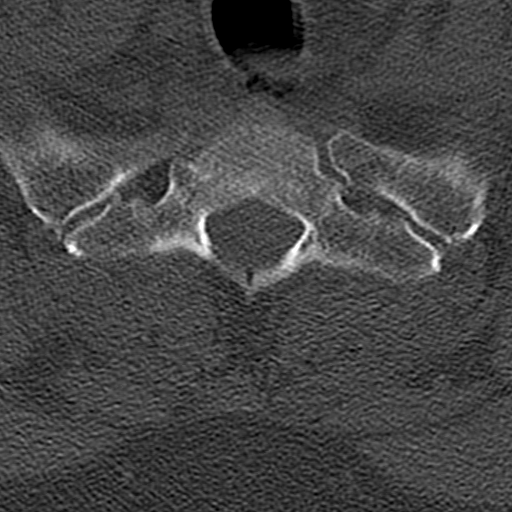
[im 31/91  bone]
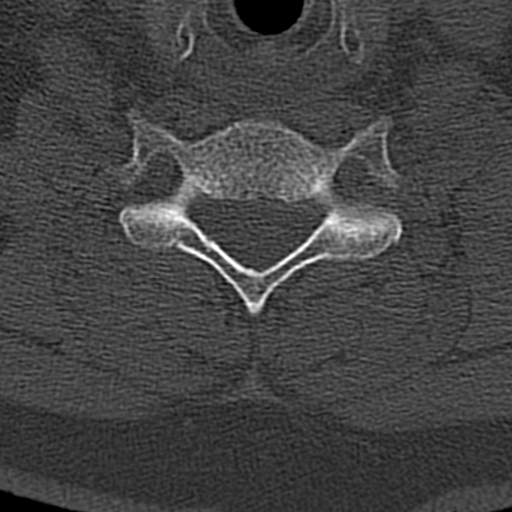
[im 46/91  bone]
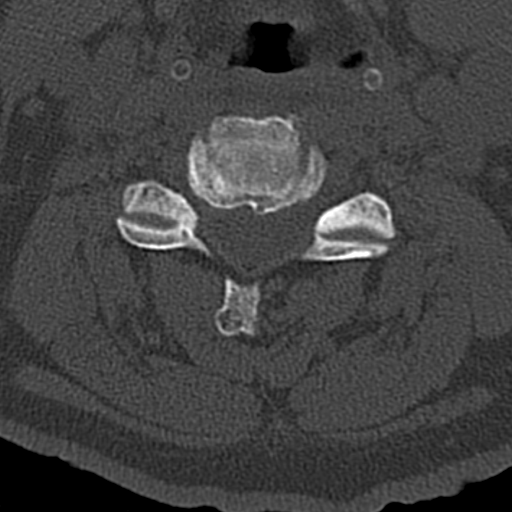
[im 61/91  bone]
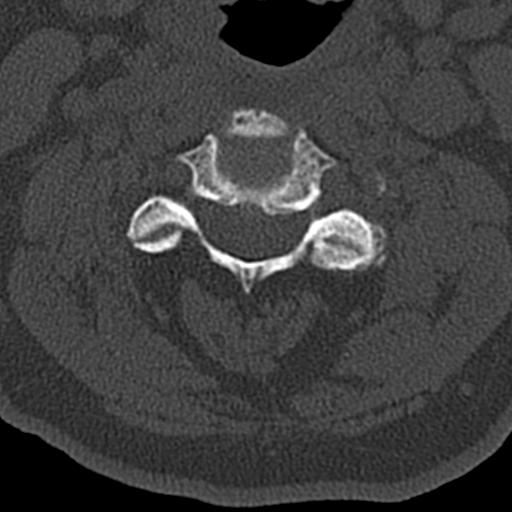
[im 76/91  soft-tissue]
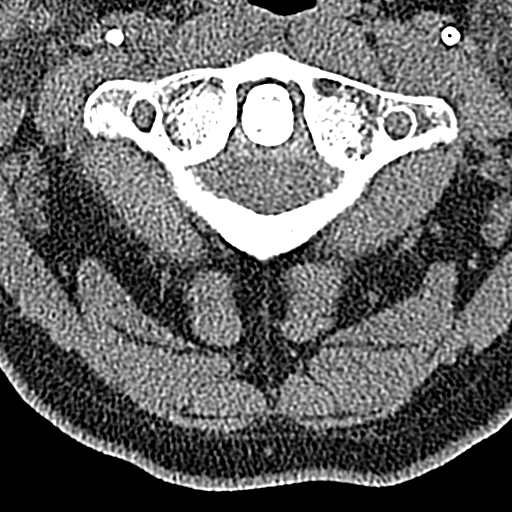
[im 76/91  bone]
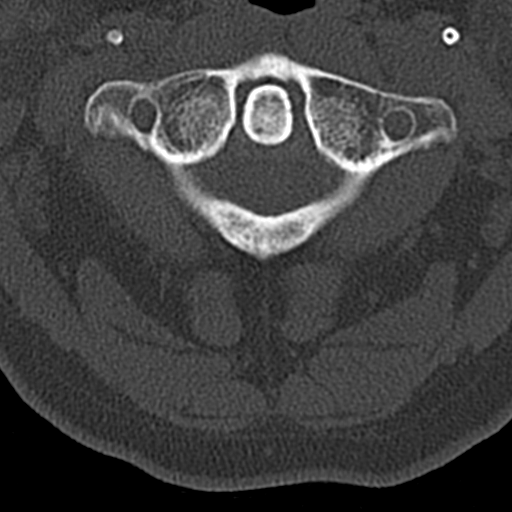

[13 of 33 positions shown; findings below may reference images not displayed]

FINDINGS: CT HEAD FINDINGS

Brain: There is no evidence for acute hemorrhage, hydrocephalus,
mass lesion, or abnormal extra-axial fluid collection. Focal hypo
attenuation along the right anterior limb internal capsule/caudate
is new in the interval, likely related to nonacute infarct head.

Vascular: No hyperdense vessel or unexpected calcification.

Skull: No evidence for fracture. No worrisome lytic or sclerotic
lesion.

Sinuses/Orbits: The visualized paranasal sinuses and mastoid air
cells are clear. Visualized portions of the globes and intraorbital
fat are unremarkable.

Other: None.

CT CERVICAL SPINE FINDINGS

Alignment: Straightening of normal cervical lordosis. No evidence
for traumatic subluxation. Degenerative mild retrolisthesis of C3 on
4 evident.

Skull base and vertebrae: No acute fracture. No primary bone lesion
or focal pathologic process.

Soft tissues and spinal canal: Patient is noted to have prevertebral
soft tissue edema of indeterminate etiology.

Disc levels: Loss of disc height noted C3-4 with endplate
degeneration.

Upper chest: Unremarkable.

Other: None.
IMPRESSION: 1. Focal hypo attenuation in the right anterior limb internal
capsule/caudate is new in the interval, likely related to nonacute
infarct. MRI brain could be used to further evaluate as clinically
warranted.
2. No evidence for cervical spine fracture or traumatic subluxation.
3. Prevertebral soft tissue edema in the upper and mid cervical
region of indeterminate etiology. This can be seen in the setting of
cervical spine injury, neoplasm, or infection. No gas evident within
the prevertebral soft tissues and no rim enhancing fluid collection
to suggest the presence of an abscess.
4. Degenerative disc disease at C3-4.
5. Loss of normal cervical lordosis, which can be related to patient
positioning, muscle spasm, or soft tissue injury.

## 2022-04-15 MED ORDER — LACTATED RINGERS IV BOLUS
500.0000 mL | Freq: Once | INTRAVENOUS | Status: AC
  Administered 2022-04-15: 500 mL via INTRAVENOUS

## 2022-04-15 MED ORDER — OXYCODONE-ACETAMINOPHEN 5-325 MG PO TABS
1.0000 | ORAL_TABLET | Freq: Once | ORAL | Status: AC
  Administered 2022-04-15: 1 via ORAL
  Filled 2022-04-15: qty 1

## 2022-04-15 MED ORDER — KETOROLAC TROMETHAMINE 15 MG/ML IJ SOLN
15.0000 mg | Freq: Once | INTRAMUSCULAR | Status: AC
  Administered 2022-04-15: 15 mg via INTRAVENOUS
  Filled 2022-04-15: qty 1

## 2022-04-15 MED ORDER — METHOCARBAMOL 500 MG PO TABS
1000.0000 mg | ORAL_TABLET | Freq: Once | ORAL | Status: AC
Start: 2022-04-15 — End: 2022-04-15
  Administered 2022-04-15: 1000 mg via ORAL
  Filled 2022-04-15: qty 2

## 2022-04-15 MED ORDER — OXYCODONE-ACETAMINOPHEN 5-325 MG PO TABS: 1.0000 | ORAL_TABLET | Freq: Four times a day (QID) | ORAL | 0 refills | Status: DC | PRN

## 2022-04-15 MED ORDER — METHOCARBAMOL 500 MG PO TABS: 500.0000 mg | ORAL_TABLET | Freq: Two times a day (BID) | ORAL | 0 refills | Status: DC

## 2022-04-15 NOTE — ED Provider Notes (Signed)
[facility] EMERGENCY DEPARTMENT Provider Note   CSN: [id number] Arrival date & time: 04/15/22  0749     History  Chief Complaint  Patient presents with   Neck Pain    Frank [name] is a 66 y.o. male.  HPI Patient presents for neck pain.  Onset was 2 days ago.  At that time, symptoms were present when he woke up.  He thought that he slept wrong.  Throughout the past 2 days, symptoms have not improved at all.  He describes the location of the pain as throughout his whole neck.  Pain is worsened with rotational head movement.  He does not radiate but he has recently had some increased pain in his lower back.  He took some ibuprofen yesterday without any relief.  He has not taken any other medications to relieve his symptoms.  He has taken his regularly prescribed medications, including this morning's doses.  Patient endorses current 10/10 severity pain.  He denies any other associated symptoms.  Medical history includes HTN, CAD, CHF, GERD, depression, HLD, chronic pain, arthritis, and anxiety.    Home Medications Prior to Admission medications   Medication Sig Start Date End Date Taking? Authorizing Provider  methocarbamol (ROBAXIN) 500 MG tablet Take 1 tablet (500 mg total) by mouth 2 (two) times daily. 04/15/22  Yes Gloris Manchester, MD  oxyCODONE-acetaminophen (PERCOCET/ROXICET) 5-325 MG tablet Take 1 tablet by mouth every 6 (six) hours as needed for severe pain. 04/15/22  Yes Gloris Manchester, MD  aspirin EC 81 MG tablet Take 81 mg by mouth daily.    [provider]  citalopram (CELEXA) 40 MG tablet TAKE 1 TABLET BY MOUTH EVERY DAY 07/17/21   Cora Collum, DO  clopidogrel (PLAVIX) 75 MG tablet TAKE 1 TABLET BY MOUTH EVERY DAY 03/21/22   Bhagat, Bhavinkumar, PA  cyclobenzaprine (FLEXERIL) 10 MG tablet Take 1 tablet (10 mg total) by mouth 3 (three) times daily as needed for muscle spasms. 07/01/18   Arlyce Harman, MD  fluticasone (FLONASE) 50 MCG/ACT nasal spray Place 2 sprays into  both nostrils daily as needed for allergies or rhinitis.    [provider]  furosemide (LASIX) 20 MG tablet Take 1 tablet (20 mg total) by mouth daily. 03/14/21   Bhagat, Sharrell Ku, PA  gabapentin (NEURONTIN) 100 MG capsule Take 1 capsule by mouth daily. 07/01/18   [provider]  metoprolol succinate (TOPROL-XL) 25 MG 24 hr tablet TAKE 1 TABLET BY MOUTH EVERY DAY WITH OR IMMEDIATELY AFTER A MEAL... 03/21/22   Manson Passey, PA  Multiple Vitamin (MULTIVITAMIN WITH MINERALS) TABS tablet Take 1 tablet by mouth daily.    [provider]  nitroGLYCERIN (NITROSTAT) 0.4 MG SL tablet DISSOLVE 1 TABLET UNDER TONGUE EVERY 5 MINUTES UP TO 15 MIN FOR CHESTPAIN. IF NO RELIEF CALL 911. 12/25/17   Kathleene Hazel, MD  pantoprazole (PROTONIX) 40 MG tablet TAKE 1 TABLET BY MOUTH ONCE DAILY AT 6:00 AM. 03/21/22   Bhagat, Bhavinkumar, PA  potassium chloride SA (KLOR-CON M20) 20 MEQ tablet TAKE 1 TABLET (20 MEQ TOTAL) BY MOUTH DAILY. 03/14/21   Manson Passey, PA  ranolazine (RANEXA) 500 MG 12 hr tablet TAKE 1 TABLET BY MOUTH TWICE A DAY 03/21/22   Bhagat, Bhavinkumar, PA  rosuvastatin (CRESTOR) 40 MG tablet TAKE 1 TABLET BY MOUTH EVERY DAY 03/21/22   Bhagat, Bhavinkumar, PA  tetrahydrozoline-zinc (VISINE-AC) 0.05-0.25 % ophthalmic solution Place 2 drops into both eyes 3 (three) times daily as needed (itching/burning).  [provider]      Allergies    Sulfa antibiotics and Hydrocodone    Review of Systems   Review of Systems  Musculoskeletal:  Positive for back pain and neck pain.  All other systems reviewed and are negative.  Physical Exam Updated Vital Signs BP 121/79   Pulse (!) 48   Temp 98.1 F (36.7 C) (Oral)   Resp (!) 21   Ht  (1.676 m)   Wt 88.5 kg   SpO2 93%   BMI 31.47 kg/m  Physical Exam Vitals and nursing note reviewed.  Constitutional:      General: He is not in acute distress.    Appearance: Normal appearance. He is  well-developed and normal weight. He is not ill-appearing, toxic-appearing or diaphoretic.  HENT:     Head: Normocephalic and atraumatic.     Right Ear: External ear normal.     Left Ear: External ear normal.     Nose: Nose normal.     Mouth/Throat:     Mouth: Mucous membranes are moist.     Pharynx: Oropharynx is clear.  Eyes:     Extraocular Movements: Extraocular movements intact.     Conjunctiva/sclera: Conjunctivae normal.  Neck:     Comments: Rotational ROM limited by pain Cardiovascular:     Rate and Rhythm: Normal rate and regular rhythm.     Heart sounds: No murmur heard. Pulmonary:     Effort: Pulmonary effort is normal. No respiratory distress.     Breath sounds: Normal breath sounds. No wheezing or rales.  Chest:     Chest wall: No tenderness.  Abdominal:     Palpations: Abdomen is soft.     Tenderness: There is no abdominal tenderness.  Musculoskeletal:        General: No swelling.     Cervical back: Neck supple. No tenderness.     Right lower leg: No edema.     Left lower leg: No edema.  Skin:    General: Skin is warm and dry.     Coloration: Skin is not jaundiced or pale.  Neurological:     General: No focal deficit present.     Mental Status: He is alert and oriented to person, place, and time.     Cranial Nerves: No cranial nerve deficit.     Sensory: No sensory deficit.     Motor: No weakness.     Coordination: Coordination normal.  Psychiatric:        Mood and Affect: Mood normal.        Behavior: Behavior normal.        Thought Content: Thought content normal.        Judgment: Judgment normal.    ED Results / Procedures / Treatments   Labs (all labs ordered are listed, but only abnormal results are displayed) Labs Reviewed  BASIC METABOLIC PANEL - Abnormal; Notable for the following components:      Result Value   Glucose, Bld 129 (*)    Calcium 8.6 (*)    All other components within normal limits  CBG MONITORING, ED - Abnormal; Notable for  the following components:   Glucose-Capillary 124 (*)    All other components within normal limits  CBC  MAGNESIUM  TROPONIN I (HIGH SENSITIVITY)  TROPONIN I (HIGH SENSITIVITY)    EKG EKG Interpretation  Date/Time:  Sunday Apr 15 2022 08:39:00 EDT Ventricular Rate:  67 PR Interval:  226 QRS Duration: 83 QT Interval:  388  QTC Calculation: 410 R Axis:   40 Text Interpretation: Sinus rhythm Ventricular premature complex Prolonged PR interval Borderline low voltage, extremity leads Confirmed by Gloris Manchester [phone]) on 04/15/2022 8:57:00 AM  Radiology DG Chest 1 View  Result Date: 04/15/2022 CLINICAL DATA:  Chest pain for 2 days EXAM: CHEST  1 VIEW COMPARISON:  08/2017 and prior studies FINDINGS: The cardiomediastinal silhouette is unremarkable. CABG changes again identified. There is no evidence of focal airspace disease, pulmonary edema, suspicious pulmonary nodule/mass, pleural effusion, or pneumothorax. No acute bony abnormalities are identified. IMPRESSION: No active disease. Electronically Signed   By: Harmon Pier M.D.   On: 04/15/2022 09:02   CT HEAD WO CONTRAST ( )  Result Date: 04/15/2022 CLINICAL DATA:  Headache and neck stiffness. EXAM: CT HEAD WITHOUT CONTRAST CT CERVICAL SPINE WITHOUT CONTRAST TECHNIQUE: Multidetector CT imaging of the head and cervical spine was performed following the standard protocol without intravenous contrast. Multiplanar CT image reconstructions of the cervical spine were also generated. RADIATION DOSE REDUCTION: This exam was performed according to the departmental dose-optimization program which includes automated exposure control, adjustment of the mA and/or kV according to patient size and/or use of iterative reconstruction technique. COMPARISON:  Head CT 02/26/2017 FINDINGS: CT HEAD FINDINGS Brain: There is no evidence for acute hemorrhage, hydrocephalus, mass lesion, or abnormal extra-axial fluid collection. 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 Vascular: No hyperdense vessel or unexpected calcification. Skull: No evidence for fracture. No worrisome lytic or sclerotic lesion. Sinuses/Orbits: The visualized paranasal sinuses and mastoid air cells are clear. Visualized portions of the globes and intraorbital fat are unremarkable. Other: None. CT CERVICAL SPINE FINDINGS Alignment: Straightening of normal cervical lordosis. No evidence for traumatic subluxation. Degenerative mild retrolisthesis of C3 on 4 evident. Skull base and vertebrae: No acute fracture. No primary bone lesion or focal pathologic process. Soft tissues and spinal canal: Patient is noted to have prevertebral soft tissue edema of indeterminate etiology. Disc levels: Loss of disc height noted C3-4 with endplate degeneration. Upper chest: Unremarkable. Other: None. IMPRESSION: 1. Focal hypo attenuation in the right anterior limb internal capsule/caudate is new in the interval, likely related to nonacute infarct. MRI brain could be used to further evaluate as clinically warranted. 2. No evidence for cervical spine fracture or traumatic subluxation. 3. Prevertebral soft tissue edema in the upper and mid cervical region of indeterminate etiology. This can be seen in the setting of cervical spine injury, neoplasm, or infection. No gas evident within the prevertebral soft tissues and no rim enhancing fluid collection to suggest the presence of an abscess. 4. Degenerative disc disease at C3-4. 5. Loss of normal cervical lordosis, which can be related to patient positioning, muscle spasm, or soft tissue injury. Electronically Signed   By: Kennith Center M.D.   On: 04/15/2022 09:37   CT Cervical Spine Wo Contrast  Result Date: 04/15/2022 CLINICAL DATA:  Headache and neck stiffness. EXAM: CT HEAD WITHOUT CONTRAST CT CERVICAL SPINE WITHOUT CONTRAST TECHNIQUE: Multidetector CT imaging of the head and  cervical spine was performed following the standard protocol without intravenous contrast. Multiplanar CT image reconstructions of the cervical spine were also generated. RADIATION DOSE REDUCTION: This exam was performed according to the departmental dose-optimization program which includes automated exposure control, adjustment of the mA and/or kV according to patient size and/or use of iterative reconstruction technique. COMPARISON:  Head CT 02/26/2017 FINDINGS: CT HEAD FINDINGS Brain: There is  no evidence for acute hemorrhage, hydrocephalus, mass lesion, or abnormal extra-axial fluid collection. Focal hypo attenuation along the right anterior limb internal capsule/caudate is new in the interval, likely related to nonacute infarct head. Vascular: No hyperdense vessel or unexpected calcification. Skull: No evidence for fracture. No worrisome lytic or sclerotic lesion. Sinuses/Orbits: The visualized paranasal sinuses and mastoid air cells are clear. Visualized portions of the globes and intraorbital fat are unremarkable. Other: None. CT CERVICAL SPINE FINDINGS Alignment: Straightening of normal cervical lordosis. No evidence for traumatic subluxation. Degenerative mild retrolisthesis of C3 on 4 evident. Skull base and vertebrae: No acute fracture. No primary bone lesion or focal pathologic process. Soft tissues and spinal canal: Patient is noted to have prevertebral soft tissue edema of indeterminate etiology. Disc levels: Loss of disc height noted C3-4 with endplate degeneration. Upper chest: Unremarkable. Other: None. IMPRESSION: 1. Focal hypo attenuation in the right anterior limb internal capsule/caudate is new in the interval, likely related to nonacute infarct. MRI brain could be used to further evaluate as clinically warranted. 2. No evidence for cervical spine fracture or traumatic subluxation. 3. Prevertebral soft tissue edema in the upper and mid cervical region of indeterminate etiology. This can be seen  in the setting of cervical spine injury, neoplasm, or infection. No gas evident within the prevertebral soft tissues and no rim enhancing fluid collection to suggest the presence of an abscess. 4. Degenerative disc disease at C3-4. 5. Loss of normal cervical lordosis, which can be related to patient positioning, muscle spasm, or soft tissue injury. Electronically Signed   By: Kennith CenterEric  Mansell M.D.   On: 04/15/2022 09:37    Procedures Procedures    Medications Ordered in ED Medications  lactated ringers bolus 500 mL (0 mLs Intravenous Stopped 04/15/22 1220)  methocarbamol (ROBAXIN) tablet 1,000 mg (1,000 mg Oral Given 04/15/22 0820)  ketorolac (TORADOL) 15 MG/ML injection 15 mg (15 mg Intravenous Given 04/15/22 0819)  oxyCODONE-acetaminophen (PERCOCET/ROXICET) 5-325 MG per tablet 1 tablet (1 tablet Oral Given 04/15/22 0820)  oxyCODONE-acetaminophen (PERCOCET/ROXICET) 5-325 MG per tablet 1 tablet (1 tablet Oral Given 04/15/22 1158)    ED Course/ Medical Decision Making/ A&P                           Medical Decision Making Amount and/or Complexity of Data Reviewed Labs: ordered. Radiology: ordered.  Risk Prescription drug management.   This patient presents to the ED for concern of neck pain, this involves an extensive number of treatment options, and is a complaint that carries with it a high risk of complications and morbidity.  The differential diagnosis includes cervical stenosis, cervical spondylosis, cervical radiculopathy, neoplasm, occult fracture, epidural abscess, osteomyelitis, muscle inflammation, referred pain from ACS   Co morbidities that complicate the patient evaluation  HTN, CAD, CHF, GERD, depression, HLD, chronic pain, arthritis, and anxiety   Additional history obtained:  Additional history obtained from patient's wife External records from outside source obtained and reviewed including EMR   Lab Tests:  I Ordered, and personally interpreted labs.  The pertinent  results include: Normal electrolytes, no leukocytosis, normal troponin   Imaging Studies ordered:  I ordered imaging studies including chest x-ray, CT head and cervical spine I independently visualized and interpreted imaging which showed no acute findings on chest x-ray; age-indeterminate focal hypoattenuation areas of right anterior limb internal capsule identified on head CT; prevertebral soft tissue edema in upper mid cervical region identified on CT cervical spine I agree  with the radiologist interpretation   Cardiac Monitoring: / EKG:  The patient was maintained on a cardiac monitor.  I personally viewed and interpreted the cardiac monitored which showed an underlying rhythm of: Sinus rhythm  Problem List / ED Course / Critical interventions / Medication management  Patient is a 66 year old male who presents for neck pain for the past 2 days.  On arrival in the ED, patient's vital signs are normal.  He denies any physical symptoms other than the pain and tightness in his neck.  On exam, he is well-appearing.  Neck range of motion is limited by pain.  He has very mild muscular tenderness bilaterally.  He has no midline tenderness.  Patient given multimodal pain control.  Diagnostic work-up was initiated.  Patient's lab work is reassuring with normal troponin and no leukocytosis.  He underwent CT imaging of head and cervical spine.  On CT head, there is age-indeterminate focal hypoattenuation of anterior limb of internal capsule.  Patient has no focal neurologic deficits on exam.  I do not suspect that this is an acute infarct.  On CT scan of the cervical spine, there was prevertebral soft tissue edema of indeterminate etiology.  Patient denies any recent trauma.  I discussed these findings with the patient and his wife.  Currently, at this hospital, MRI is unavailable.  Patient was offered transport to [facility] for MRI studies to further characterize these findings.  Patient and wife were not  amenable to this plan.  Following multimodal pain control, patient did have improved symptoms.  His range of motion is now improved.  Patient stated that he would prefer to follow-up with his primary care doctor to schedule outpatient imaging.  Patient was encouraged to return to the ED at any time if he does change his mind.  He was prescribed Percocet and Robaxin to be taken as needed at home for pain control.  He was discharged in stable condition. I ordered medication including Toradol, Percocet, Robaxin for analgesia Reevaluation of the patient after these medicines showed that the patient improved I have reviewed the patients home medicines and have made adjustments as needed   Social Determinants of Health:  Has access to outpatient care, including scheduled PCP appointment this week   Test / Admission - Considered:  Encouraged transfer to [facility] for MRI imaging but patient declined.         Final Clinical Impression(s) / ED Diagnoses Final diagnoses:  Neck pain    Rx / DC Orders ED Discharge Orders          Ordered    methocarbamol (ROBAXIN) 500 MG tablet  2 times daily        04/15/22 1136    oxyCODONE-acetaminophen (PERCOCET/ROXICET) 5-325 MG tablet  Every 6 hours PRN        04/15/22 1136              Gloris Manchester, MD 04/16/22 [phone]

## 2022-04-15 NOTE — ED Triage Notes (Signed)
Patient reports neck pain for the past two days. Pain 10/10.

## 2022-04-15 NOTE — Discharge Instructions (Addendum)
You had the following findings on CT imaging: 1. Focal hypo attenuation in the right anterior limb internal capsule/caudate is new in the interval, likely related to nonacute infarct. 2. Prevertebral soft tissue edema in the upper and mid cervical region of indeterminate etiology. This can be seen in the setting of cervical spine injury, neoplasm, or infection. No gas evident within the prevertebral soft tissues and no rim enhancing fluid collection to suggest the presence of an abscess.  MRIs can better evaluate these areas.  If you have any worsening symptoms or if you change your mind about getting MRI study, please return to the ED.  Otherwise, please follow-up with your primary care doctor soon as possible to schedule outpatient studies.  There were medications that were sent to your pharmacy for relief of any further neck pain.  Take these as needed.

## 2022-04-18 NOTE — Progress Notes (Unsigned)
    SUBJECTIVE:   CHIEF COMPLAINT / HPI:   ER follow-up-neck pain: Patient went to the emergency department on 04/15/2022 for neck pain. He woke up with it and it continued to worsen. He states since then he continues to have pain despite the oxycodone and robaxan. He does not remember any injury or cause of hte pain.  Imaging did show some prevertebral soft tissue edema with a broad differential.  Patient denies any fevers and states his neck pain continues to be similar to before and has not worsened.  Previous stroke: Seen on CT imaging when in the emergency department.  Unsure when this occurred.  They recommended MRI which was not present in that emergency department and the patient was not interested in transfer at that time.  Spoke with the patient and spouse who do not remember any times where he had any focal deficits.  He denies any focal deficits or issues at this time.   OBJECTIVE:   BP 132/86   Pulse 78   Temp 98 F (36.7 C) (Oral)   Wt 197 lb (89.4 kg)   SpO2 94%   BMI 31.80 kg/m    General: NAD, pleasant, able to participate in exam Cardiac: RRR, no murmurs. Respiratory: CTAB, normal effort, No wheezes, rales or rhonchi Abdomen: Bowel sounds present, nontender, nondistended, no hepatosplenomegaly. MSK: Pain when looking to the left on the right with the neck, range of motion is significantly limited in all directions due to discomfort with both active and passive range of motion.  No significant pain when palpating in the posterior aspect of the neck or the muscles on each lateral side. Skin: warm and dry, no rashes noted Neuro: alert, no obvious focal deficits, CN II through XII intact, fine touch sensation intact in upper lower extremities bilaterally, strength 5/5 in upper lower extremities bilaterally. Psych: Normal affect and mood  ASSESSMENT/PLAN:    Previous stroke: Seen on CT head in the emergency department.  As this did not appear to be acute and they did  not have an MRI machine the work-up was deferred to PCP. 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  Patient continues to have neck pain in that distribution.  Discussed with attending, Dr. Manson Passey and we will get the patient referred for neurosurgery for further evaluation and additional imaging recommendations.  Recommended against NSAIDs as the patient stated he had been taking a few of those at home.  Recommended continuing the oxycodone/acetaminophen as well as the Robaxin in the meantime.   Jackelyn Poling, DO [facility]

## 2022-04-19 ENCOUNTER — Ambulatory Visit (INDEPENDENT_AMBULATORY_CARE_PROVIDER_SITE_OTHER): Payer: No Typology Code available for payment source | Admitting: Family Medicine

## 2022-04-19 VITALS — BP 132/86 | HR 78 | Temp 98.0°F | Wt 197.0 lb

## 2022-04-19 DIAGNOSIS — M542 Cervicalgia: Secondary | ICD-10-CM

## 2022-04-19 DIAGNOSIS — I639 Cerebral infarction, unspecified: Secondary | ICD-10-CM | POA: Diagnosis not present

## 2022-04-19 NOTE — Patient Instructions (Signed)
We are getting some imaging of your head and neck as well as some lab work today to evaluate the stroke.  I would like you make a follow-up appointment with Dr. Shanon Rosser or one of our other physicians in the next 1 week to further your work-up.  For your other neck findings we are doing a referral for neurosurgery to discuss the best x-ray or imaging to perform.  If you develop any fevers, worsening neck pain, or other concerning symptoms such as strokelike symptoms you should immediately go to the emergency department

## 2022-04-20 LAB — LIPID PANEL
Chol/HDL Ratio: 2.5 ratio (ref 0.0–5.0)
Cholesterol, Total: 124 mg/dL (ref 100–199)
HDL: 50 mg/dL (ref >39)
LDL Chol Calc (NIH): 54 mg/dL (ref 0–99)
Triglycerides: 107 mg/dL (ref 0–149)
VLDL Cholesterol Cal: 20 mg/dL (ref 5–40)

## 2022-04-20 LAB — HEMOGLOBIN A1C
Est. average glucose Bld gHb Est-mCnc: 140 mg/dL
Hgb A1c MFr Bld: 6.5 % — ABNORMAL HIGH (ref 4.8–5.6)

## 2022-04-26 ENCOUNTER — Ambulatory Visit: Payer: No Typology Code available for payment source | Admitting: Family Medicine

## 2022-05-03 ENCOUNTER — Ambulatory Visit (HOSPITAL_COMMUNITY)
Admission: RE | Admit: 2022-05-03 | Discharge: 2022-05-03 | Disposition: A | Discharge status: Home / Self Care | Payer: No Typology Code available for payment source | Source: Ambulatory Visit | Attending: Family Medicine | Admitting: Family Medicine

## 2022-05-03 DIAGNOSIS — I639 Cerebral infarction, unspecified: Secondary | ICD-10-CM

## 2022-05-03 DIAGNOSIS — I6502 Occlusion and stenosis of left vertebral artery: Secondary | ICD-10-CM | POA: Diagnosis not present

## 2022-05-03 IMAGING — MR MR MRA NECK WO/W CM
3 series · 38 of 48 positions shown · IV contrast (9 ml gadavist)
Comparison: CT head [DATE]

CLINICAL DATA: TIA

EXAM:
MRI HEAD WITHOUT CONTRAST
MRA HEAD WITHOUT CONTRAST
MRA NECK WITHOUT AND WITH CONTRAST
TECHNIQUE: Multiplanar, multi-echo pulse sequences of the brain and surrounding
structures were acquired without intravenous contrast. Angiographic
images of the Circle of Willis were acquired using MRA technique
without intravenous contrast. Angiographic images of the neck were
acquired using MRA technique without and with intravenous contrast.
Carotid stenosis measurements (when applicable) are obtained
utilizing NASCET criteria, using the distal internal carotid
diameter as the denominator.
CONTRAST:  9mL GADAVIST GADOBUTROL 1 MMOL/ML IV SOLN

[Series 7: tof_fl3d_tra_iso · axial · 0.6mm · 0.52mm/px · z∈[-137,-61]mm · 20 of 133 slices shown]
[im 1/133]
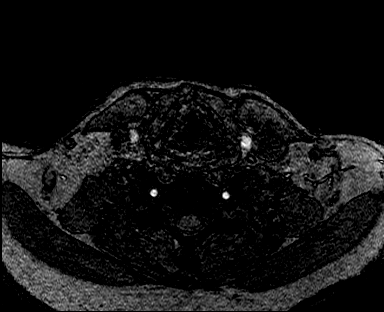
[im 6/133]
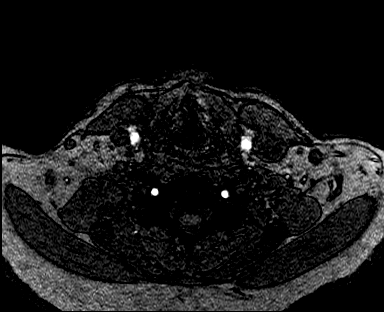
[im 12/133]
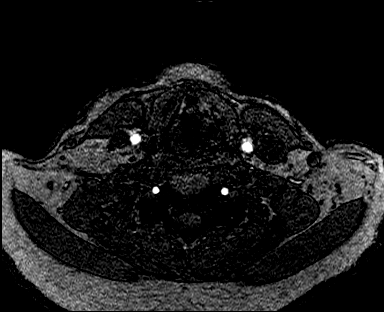
[im 18/133]
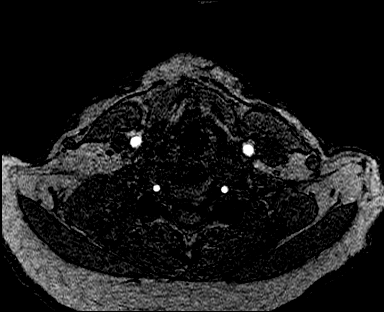
[im 23/133]
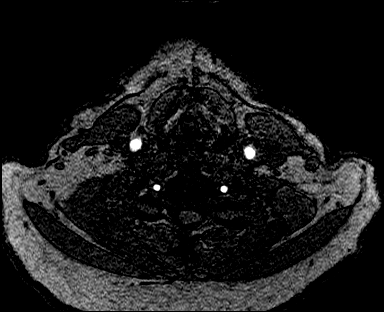
[im 29/133]
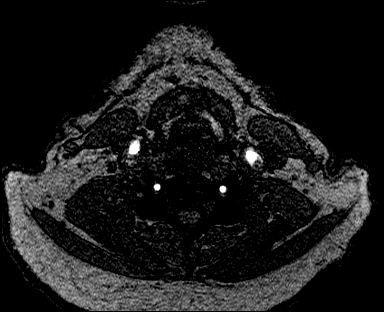
[im 35/133]
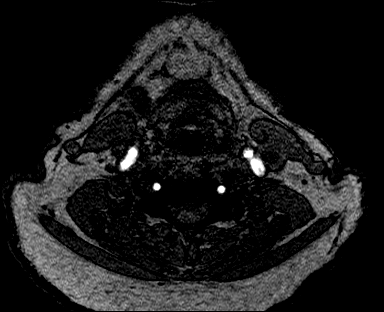
[im 41/133]
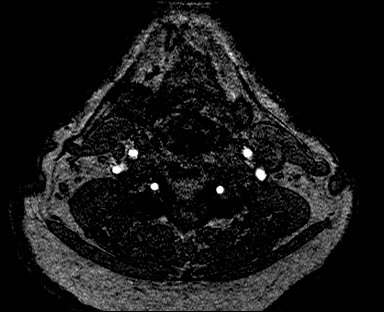
[im 46/133]
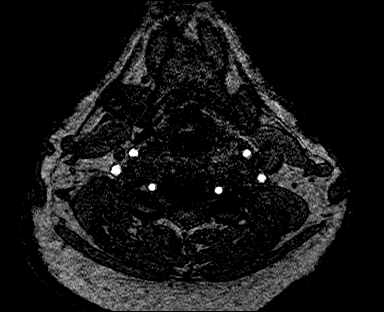
[im 52/133]
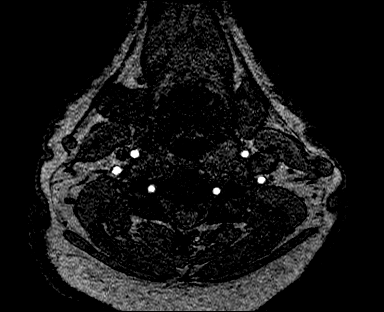
[im 58/133]
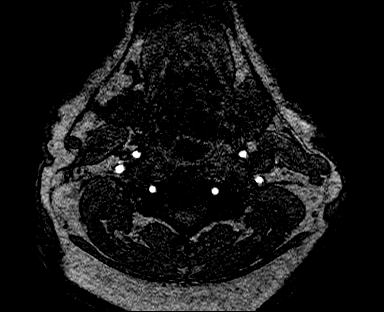
[im 64/133]
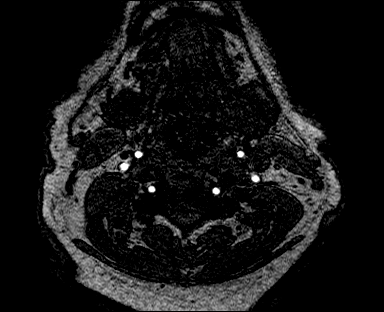
[im 69/133]
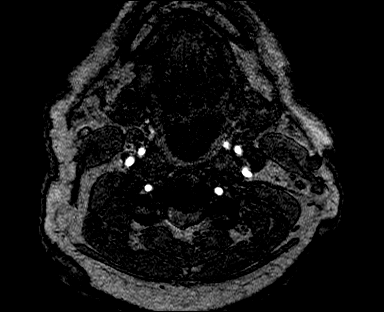
[im 75/133]
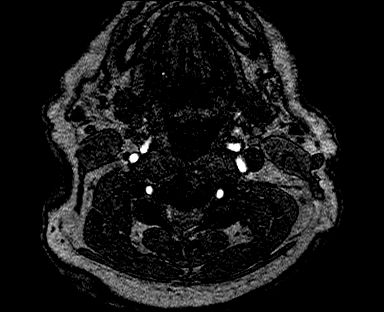
[im 81/133]
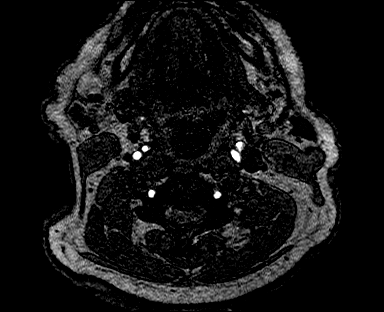
[im 87/133]
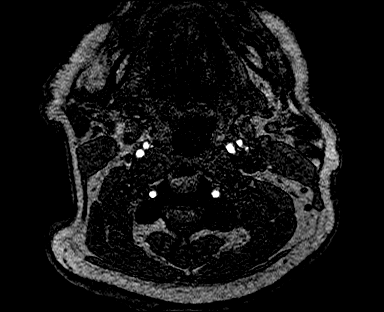
[im 92/133]
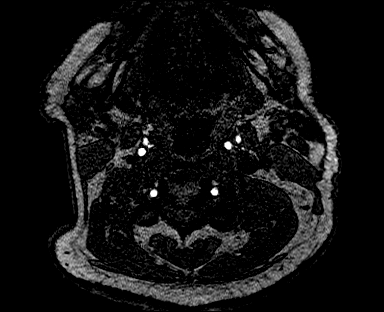
[im 110/133]
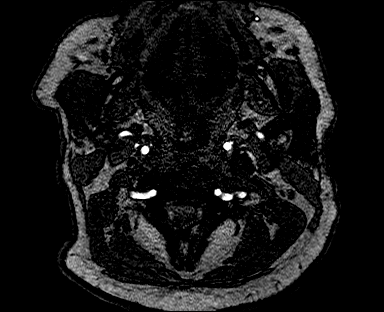
[im 115/133]
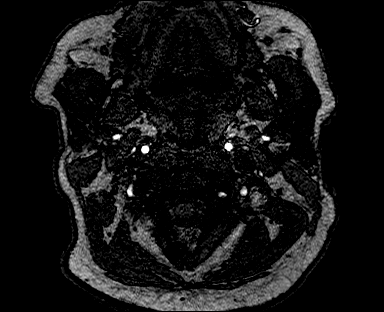
[im 127/133]
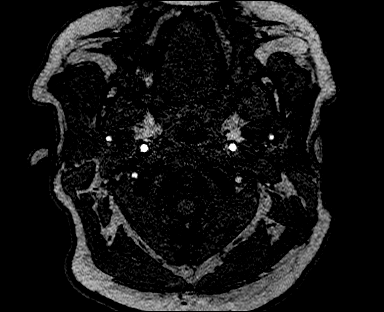

[Series 14: angio_fl3d_cor_post_ttc=3.0s_moco-adv · coronal · 0.9mm · 0.85mm/px · 9 of 71 slices shown]
[im 1/71]
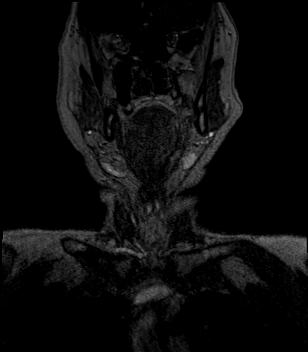
[im 13/71]
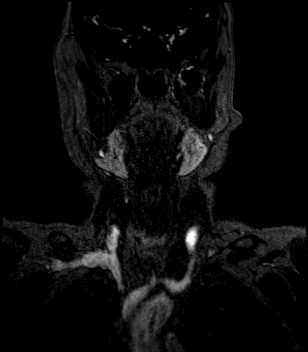
[im 20/71]
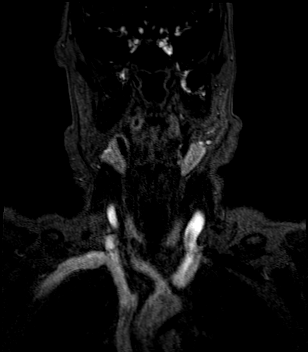
[im 32/71]
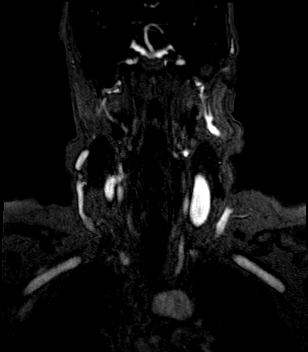
[im 39/71]
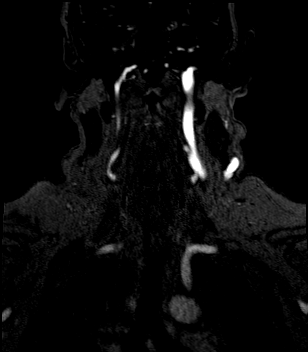
[im 51/71]
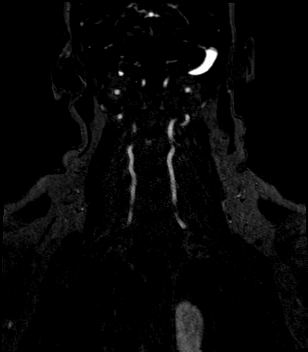
[im 58/71]
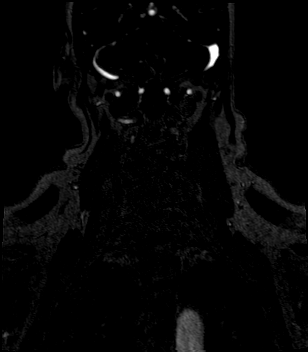
[im 64/71]
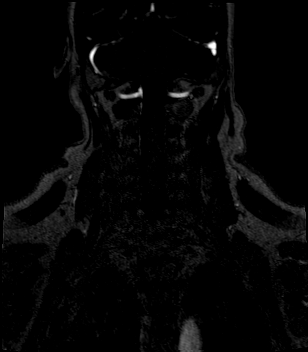
[im 71/71]
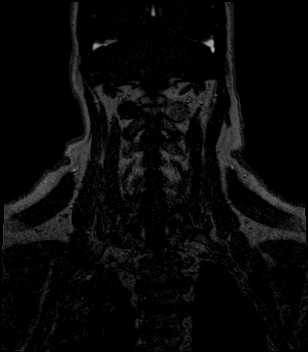

[Series 15: angio_fl3d_cor_post_ttc=3.0s_moco-adv_sub · coronal · 0.9mm · 0.85mm/px · 9 of 70 slices shown]
[im 1/70]
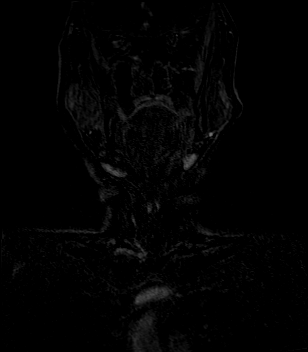
[im 13/70]
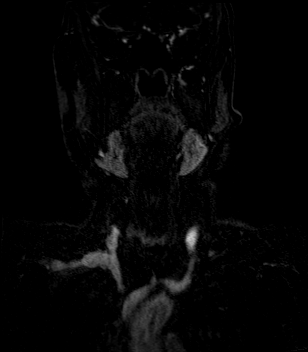
[im 19/70]
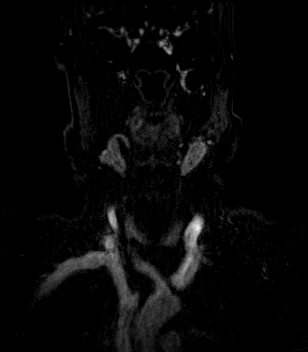
[im 32/70]
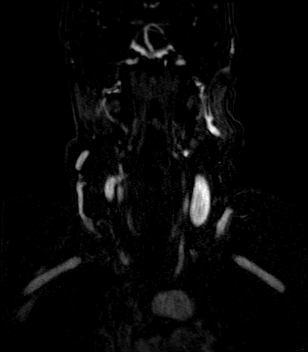
[im 38/70]
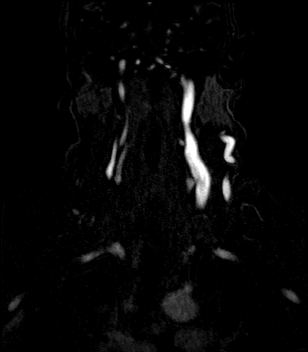
[im 51/70]
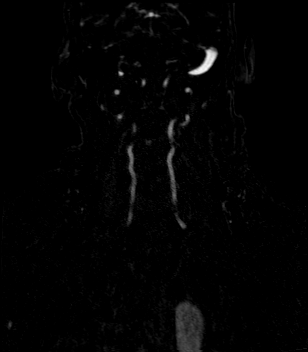
[im 57/70]
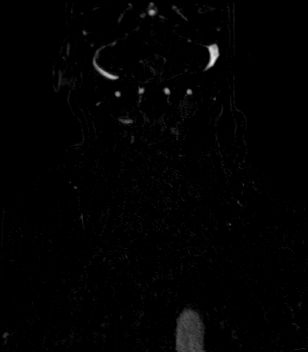
[im 63/70]
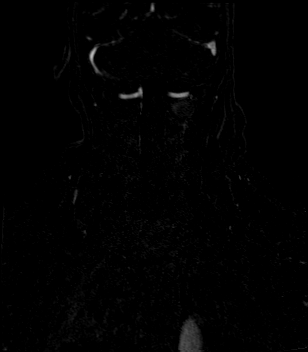
[im 70/70]
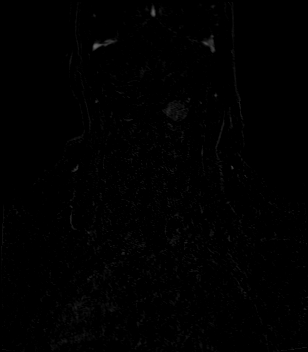

[38 of 48 positions shown; findings below may reference images not displayed]

FINDINGS: MRI HEAD FINDINGS

Brain: There is no acute intracranial hemorrhage, extra-axial fluid
collection, or acute infarct.

Parenchymal volume is normal. The ventricles are normal in size.
There is a remote infarct in the right caudate/lentiform nucleus
corresponding to the finding on the prior CT head. SWI signal
dropout is consistent with a small amount of associated chronic
blood products. There is no other evidence of prior infarct. There
are a few small foci of FLAIR signal abnormality in the subcortical
and periventricular white matter likely reflecting sequela of
minimal underlying chronic white matter microangiopathy.

There is no mass lesion.  There is no mass effect or midline shift.

Vascular: See below.

Skull and upper cervical spine: Normal marrow signal.

Sinuses/Orbits: The paranasal sinuses are clear. The globes and
orbits are unremarkable.

Other: None.

MRA HEAD FINDINGS

Anterior circulation: There is mild irregularity of the intracranial
ICAs likely reflecting atherosclerotic disease resulting in up to
mild stenosis on the right and no significant stenosis on the left.

The bilateral MCAs are patent, without proximal high-grade stenosis
or occlusion.

The bilateral ACAs are patent without proximal high-grade stenosis
or occlusion. The anterior communicating artery is normal.

There is no aneurysm or AVM.

Posterior circulation: The bilateral V4 segments are patent. The
basilar artery is patent.

The bilateral PCAs are patent, without proximal high-grade stenosis
or occlusion. The posterior communicating arteries are not
definitely seen.

There is no aneurysm or AVM.

Anatomic variants: None.

MRA NECK FINDINGS

Aortic arch: The imaged aortic arch is normal. The origins of the
major branch vessels are patent. The subclavian arteries are patent
to the level imaged.

Right carotid system: The right common, internal, and external
carotid arteries are patent, without hemodynamically significant
stenosis or occlusion. There is no evidence of dissection or
aneurysm.

Left carotid system: The left common, internal, and external carotid
arteries are patent, without hemodynamically significant stenosis or
occlusion. There is no evidence of dissection or aneurysm.

Vertebral arteries: There is severe stenosis at the origin of the
left vertebral artery (15-50). The vertebral arteries are otherwise
patent with antegrade flow, without other hemodynamically
significant stenosis or occlusion. There is no evidence of
dissection or aneurysm.

Other: None
IMPRESSION: 1. Remote infarct in the right caudate/lentiform nucleus with small
amount of chronic blood products, corresponding to the finding on
the prior CT head. Minimal background underlying chronic white
matter microangiopathy.
2. Patent intracranial vasculature with no proximal high-grade
stenosis or occlusion.
3. Severe stenosis at the origin of the left vertebral artery.
Otherwise, patent vasculature of the neck with no other
hemodynamically significant stenosis.

## 2022-05-03 IMAGING — MR MR MRA HEAD W/O CM
1 series · 18 of 48 positions shown · IV contrast (gadavist)
Comparison: CT head [DATE]

CLINICAL DATA: TIA

EXAM:
MRI HEAD WITHOUT CONTRAST
MRA HEAD WITHOUT CONTRAST
MRA NECK WITHOUT AND WITH CONTRAST
TECHNIQUE: Multiplanar, multi-echo pulse sequences of the brain and surrounding
structures were acquired without intravenous contrast. Angiographic
images of the Circle of Willis were acquired using MRA technique
without intravenous contrast. Angiographic images of the neck were
acquired using MRA technique without and with intravenous contrast.
Carotid stenosis measurements (when applicable) are obtained
utilizing NASCET criteria, using the distal internal carotid
diameter as the denominator.
CONTRAST:  9mL GADAVIST GADOBUTROL 1 MMOL/ML IV SOLN

[Series 1: TOF · axial · 0.4mm · 0.43mm/px · z∈[-53,+44]mm · 18 of 254 slices shown]
[im 1/254]
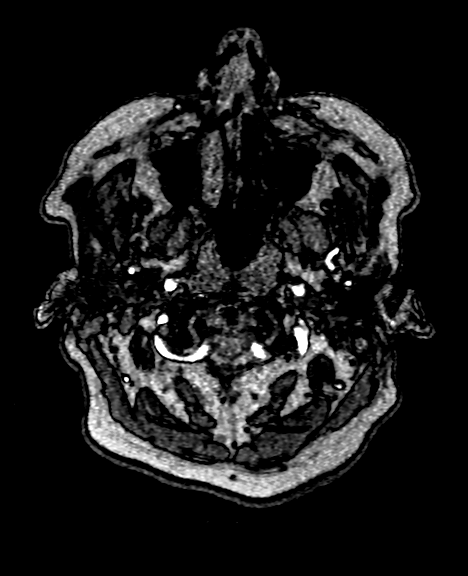
[im 6/254]
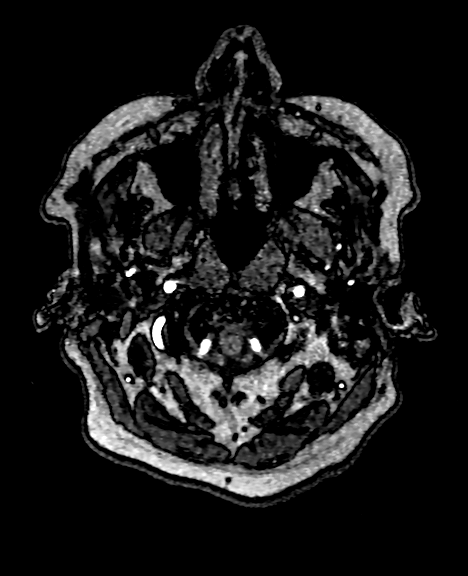
[im 11/254]
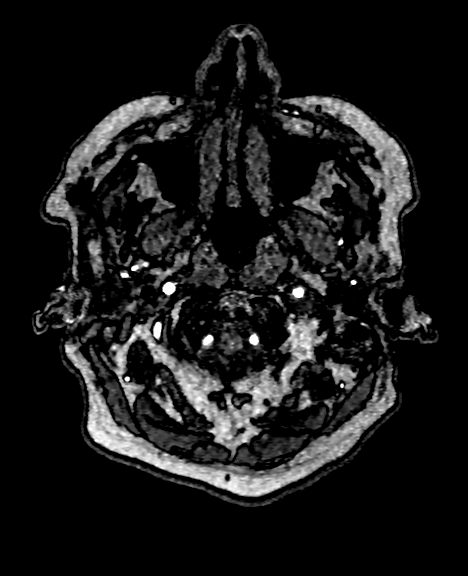
[im 17/254]
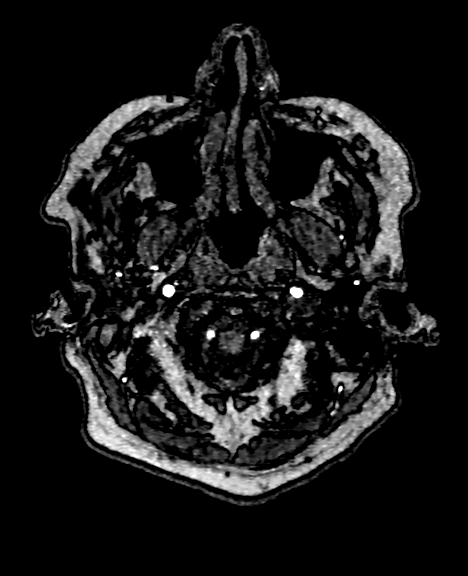
[im 22/254]
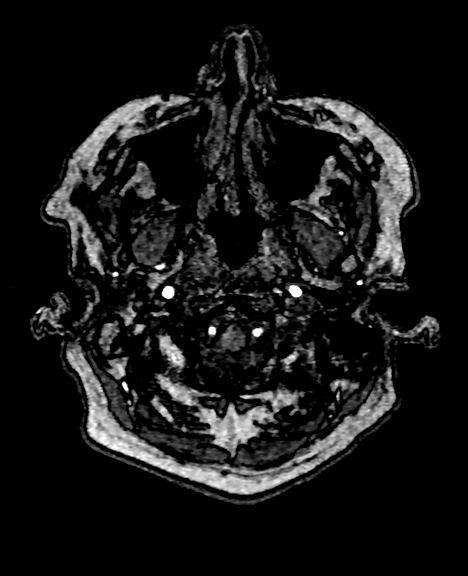
[im 27/254]
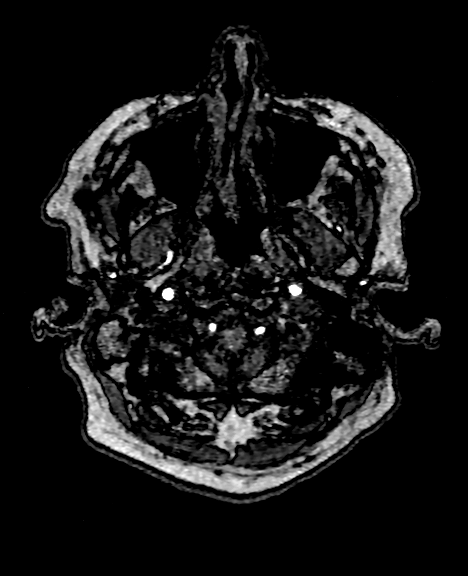
[im 33/254]
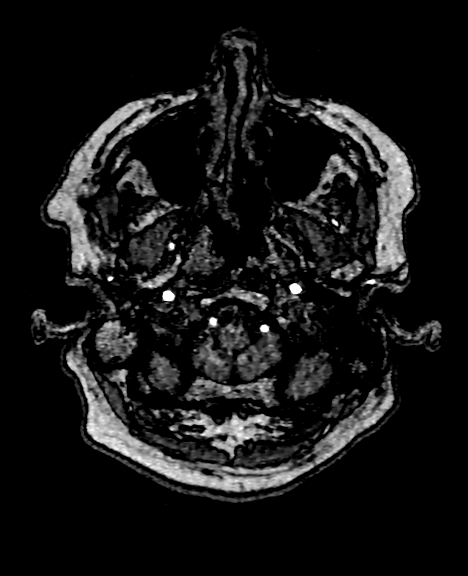
[im 38/254]
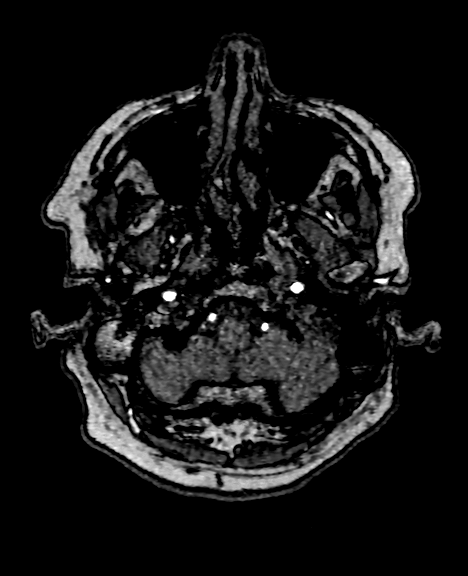
[im 44/254]
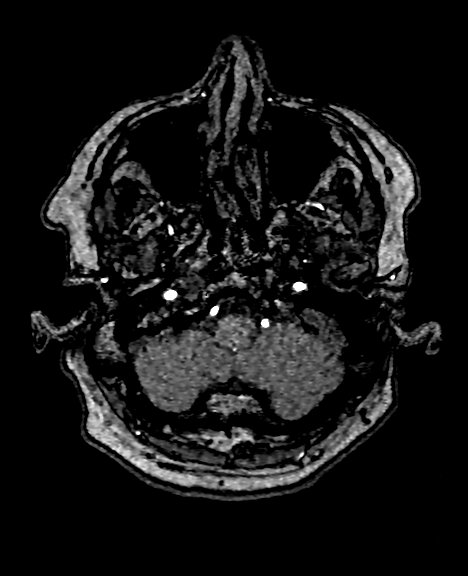
[im 49/254]
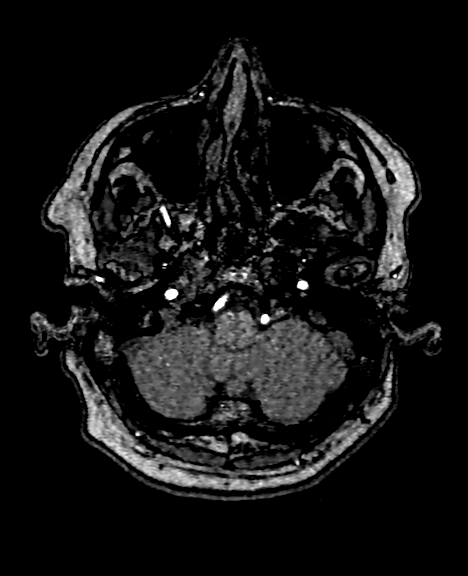
[im 81/254]
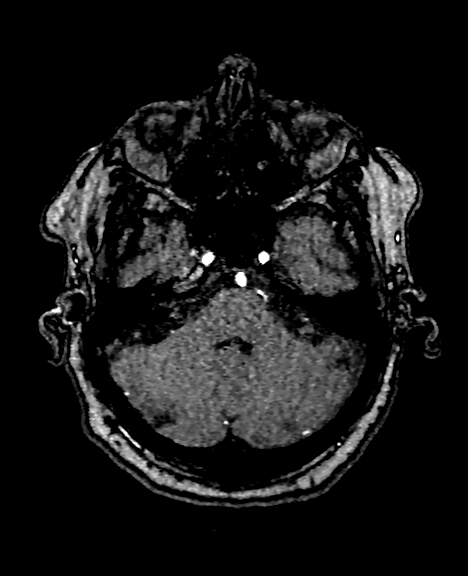
[im 114/254]
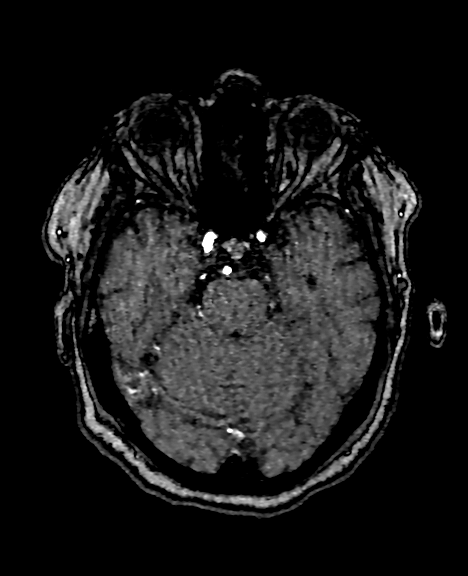
[im 130/254]
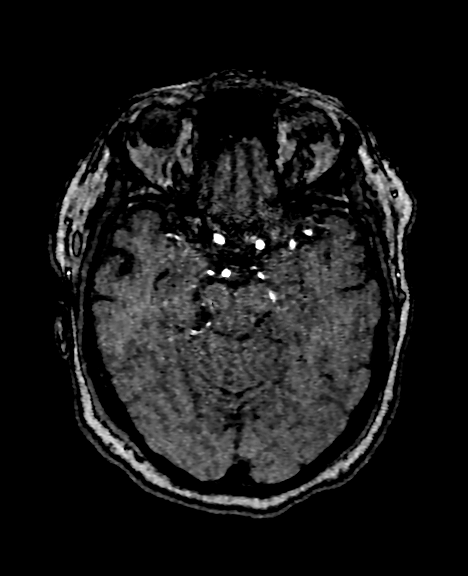
[im 146/254]
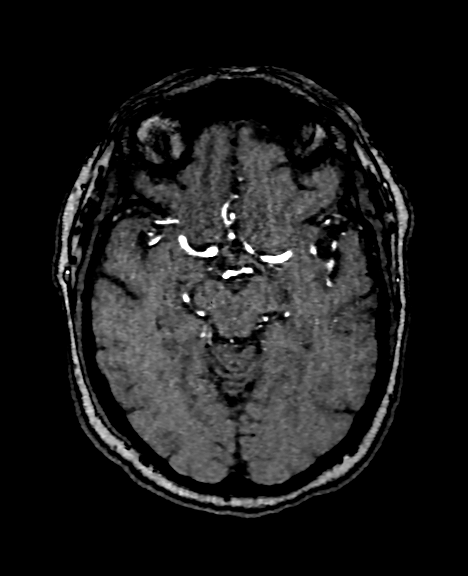
[im 178/254]
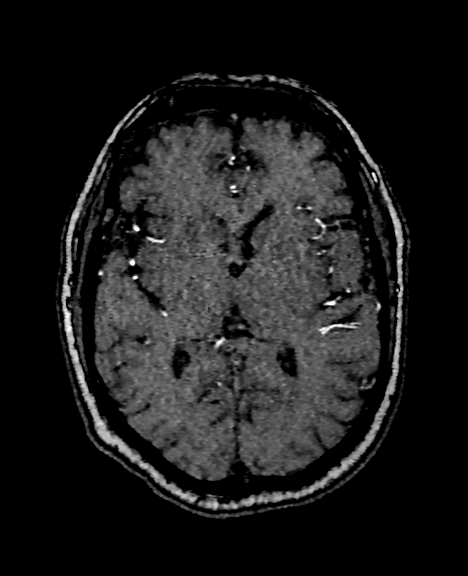
[im 210/254]
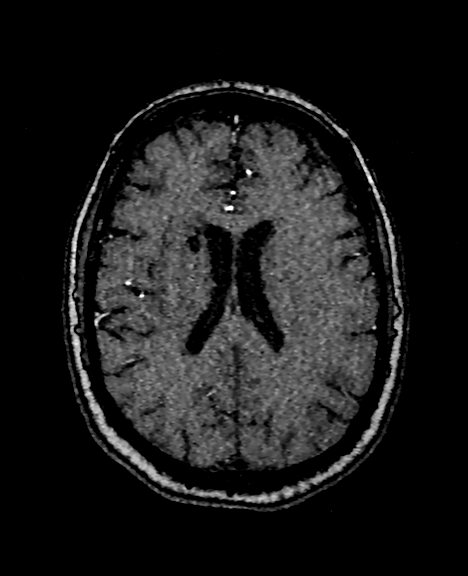
[im 216/254]
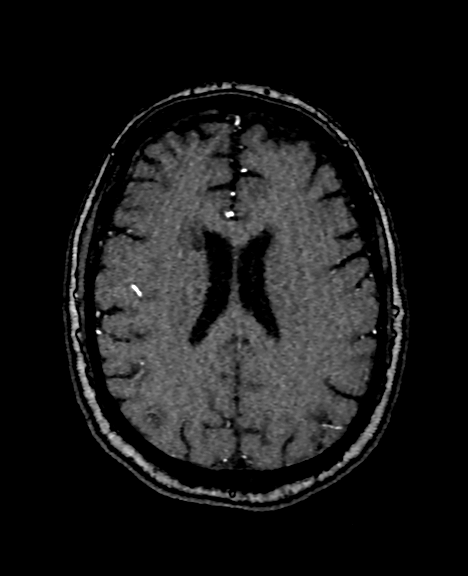
[im 243/254]
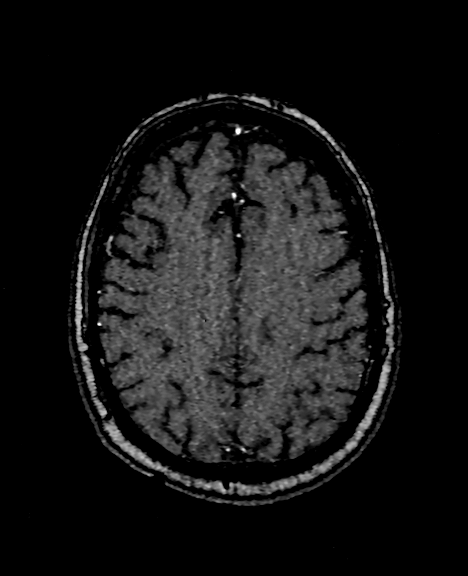

[18 of 48 positions shown; findings below may reference images not displayed]

FINDINGS: MRI HEAD FINDINGS

Brain: There is no acute intracranial hemorrhage, extra-axial fluid
collection, or acute infarct.

Parenchymal volume is normal. The ventricles are normal in size.
There is a remote infarct in the right caudate/lentiform nucleus
corresponding to the finding on the prior CT head. SWI signal
dropout is consistent with a small amount of associated chronic
blood products. There is no other evidence of prior infarct. There
are a few small foci of FLAIR signal abnormality in the subcortical
and periventricular white matter likely reflecting sequela of
minimal underlying chronic white matter microangiopathy.

There is no mass lesion.  There is no mass effect or midline shift.

Vascular: See below.

Skull and upper cervical spine: Normal marrow signal.

Sinuses/Orbits: The paranasal sinuses are clear. The globes and
orbits are unremarkable.

Other: None.

MRA HEAD FINDINGS

Anterior circulation: There is mild irregularity of the intracranial
ICAs likely reflecting atherosclerotic disease resulting in up to
mild stenosis on the right and no significant stenosis on the left.

The bilateral MCAs are patent, without proximal high-grade stenosis
or occlusion.

The bilateral ACAs are patent without proximal high-grade stenosis
or occlusion. The anterior communicating artery is normal.

There is no aneurysm or AVM.

Posterior circulation: The bilateral V4 segments are patent. The
basilar artery is patent.

The bilateral PCAs are patent, without proximal high-grade stenosis
or occlusion. The posterior communicating arteries are not
definitely seen.

There is no aneurysm or AVM.

Anatomic variants: None.

MRA NECK FINDINGS

Aortic arch: The imaged aortic arch is normal. The origins of the
major branch vessels are patent. The subclavian arteries are patent
to the level imaged.

Right carotid system: The right common, internal, and external
carotid arteries are patent, without hemodynamically significant
stenosis or occlusion. There is no evidence of dissection or
aneurysm.

Left carotid system: The left common, internal, and external carotid
arteries are patent, without hemodynamically significant stenosis or
occlusion. There is no evidence of dissection or aneurysm.

Vertebral arteries: There is severe stenosis at the origin of the
left vertebral artery (15-50). The vertebral arteries are otherwise
patent with antegrade flow, without other hemodynamically
significant stenosis or occlusion. There is no evidence of
dissection or aneurysm.

Other: None
IMPRESSION: 1. Remote infarct in the right caudate/lentiform nucleus with small
amount of chronic blood products, corresponding to the finding on
the prior CT head. Minimal background underlying chronic white
matter microangiopathy.
2. Patent intracranial vasculature with no proximal high-grade
stenosis or occlusion.
3. Severe stenosis at the origin of the left vertebral artery.
Otherwise, patent vasculature of the neck with no other
hemodynamically significant stenosis.

## 2022-05-03 MED ORDER — GADOBUTROL 1 MMOL/ML IV SOLN
9.0000 mL | Freq: Once | INTRAVENOUS | Status: AC | PRN
  Administered 2022-05-03: 9 mL via INTRAVENOUS

## 2022-05-08 DIAGNOSIS — I639 Cerebral infarction, unspecified: Secondary | ICD-10-CM | POA: Insufficient documentation

## 2022-05-08 NOTE — Progress Notes (Unsigned)
    SUBJECTIVE:   CHIEF COMPLAINT / HPI:   Discuss recent lab results: 66 year old male presents for follow-up after being sent for multiple labs and imaging modalities due to a previous ER visit showing a history of a remote stroke.  He presents today to discuss these results and any additional recommendations. Imaging results include MRI brain, MR angio head and neck without contrast.  These showed remote infarct of the right caudate/lentiform nucleus with small amount of chronic blood products corresponding to the finding on the prior head CT.  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  A host of restratification lab work was performed including a lipid panel which showed an LDL of 54 and HDL of 50, A1c which was mildly elevated at 6.5.  His wife endorses no concerns with regard to a physical standpoint but does think his memory has worsened over the past few months.  She inquires about a memory evaluation or geriatrics referral.  PERTINENT  PMH / PSH: See above  OBJECTIVE:   BP 114/68   Pulse 61   Ht 5\' 6"  (1.676 m)   Wt 194 lb (88 kg)   SpO2 95%   BMI 31.31 kg/m    General: NAD, pleasant, able to participate in exam Respiratory: No respiratory distress Skin: warm and dry, no rashes noted Psych: Normal affect and mood  ASSESSMENT/PLAN:   History of stroke: Presenting today to discuss lab results after the work-up from his history of remote stroke found in recent ED visit.  MRI brain does confirm this.  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  Restratification labs showed his LDL at 54, HDL 50.  A1c elevated at 6.5.  Discussed the importance of diet and exercise modalities given his history.  He is currently on Crestor 40mg .  The MRI did show small amount of chronic blood products corresponding to the findings on the prior  head CT, no signs of frank bleed.  We will continue aspirin and plavix which he is on per his cardiologist.  Will order echo to complete the stroke work-up.  Blood pressure is appropriate today.  Will refer to neurology given the severe stenosis at the left vertebral artery to get their recommendations.  We will also place a referral for geriatrics clinic to evaluate for memory concern.   Elevated A1c: A1c of 6.5.  No previous A1c in his chart.  Recommended follow-up in 3 months to recheck.  If it does confirm diabetes then we will need to address modalities for this at that time.  I did recommend continuing diet and exercise.  , DO [facility]

## 2022-05-09 ENCOUNTER — Encounter: Payer: Self-pay | Admitting: Family Medicine

## 2022-05-09 ENCOUNTER — Ambulatory Visit (INDEPENDENT_AMBULATORY_CARE_PROVIDER_SITE_OTHER): Payer: No Typology Code available for payment source | Admitting: Family Medicine

## 2022-05-09 VITALS — BP 114/68 | HR 61 | Ht 66.0 in | Wt 194.0 lb

## 2022-05-09 DIAGNOSIS — I639 Cerebral infarction, unspecified: Secondary | ICD-10-CM

## 2022-05-09 DIAGNOSIS — R413 Other amnesia: Secondary | ICD-10-CM | POA: Diagnosis not present

## 2022-05-09 DIAGNOSIS — R7309 Other abnormal glucose: Secondary | ICD-10-CM | POA: Diagnosis not present

## 2022-05-09 NOTE — Patient Instructions (Signed)
Our plan for today.  We placed a referral for neurology and they should reach out to you.  It is important to also keep the neurosurgery appointment that we talked about previously.  We are also going to get you scheduled for an echo which is an ultrasound of the heart.  The nurse will reach out to you with the schedule for this.  I went ahead and referred you for geriatrics clinic and they will reach out to you to get that scheduled.

## 2022-05-19 ENCOUNTER — Other Ambulatory Visit: Payer: Self-pay | Admitting: Physician Assistant

## 2022-05-23 ENCOUNTER — Other Ambulatory Visit (HOSPITAL_COMMUNITY): Payer: No Typology Code available for payment source

## 2022-06-07 ENCOUNTER — Other Ambulatory Visit: Payer: Self-pay | Admitting: Physician Assistant

## 2022-06-12 NOTE — Progress Notes (Signed)
LM for patient ok per DPR with geri appt information.  Allisen Pidgeon,CMA

## 2022-06-17 ENCOUNTER — Other Ambulatory Visit: Payer: Self-pay | Admitting: Family Medicine

## 2022-07-12 ENCOUNTER — Ambulatory Visit: Payer: No Typology Code available for payment source

## 2022-07-12 NOTE — Progress Notes (Signed)
Chief Complaint  Patient presents with   Follow-up    CAD    History of Present Illness: 66 yo male with a history of CAD s/p CABG 2004 with subsequent PCI, HLD, HTN and mitral regurgitation who is here today for cardiac follow up. He underwent 5V CABG in 2004. 2 drug eluting stents were placed in the SVG to to PDA in July 2013. At that time the LIMA to LAD was patent but the vein grafts to the OM, PLA and Diagonal were occluded. Repeat cath in December 2014 with recurrent severe disease in the SVG to PDA treated with 2 drug eluting stents. Echo August 2015 with normal LV function, mild MR. He was seen here in July 2016 with recurrent chest pain. Cardiac cath July 2016 with new occlusion of SVG to PDA. The only patent graft was the LIMA to LAD. Diffuse disease in native vessels. No targets for PCI. Ranexa was started. Normal ABI June 2018. Echo 12/30/17 with LVEF=55-60%, mild to moderate MR. Evidence of prior CVA on brain MRI in June 2023.   He is here today for follow up. The patient denies any chest pain, dyspnea, palpitations, lower extremity edema, orthopnea, PND, dizziness, near syncope or syncope.    Primary Care Physician: [facility] Wynelle Link, Gerlene Burdock, MD)   Past Medical History:  Diagnosis Date   Anxiety    Arthritis    hands, shoulders, arms   CAD (coronary artery disease)    5V CABG in 2004; stent to RCA x1 and SVG to acute marginal x2 2011;  PTCA/DES x 2 body of SVG to PDA  05/20/12   CHF (congestive heart failure) (HCC)     EF 55% by echo 03/27/2012   Chronic lower back pain    "w/activity"   Depression    GERD (gastroesophageal reflux disease)    HLD (hyperlipidemia)    Hypertension    Low HDL (under 40)    Mitral regurgitation    Moderate by TEE 03/27/12   Myocardial infarction (HCC) 2004   Numbness and tingling in hands    Numbness and tingling of both legs    Umbilical hernia    unrepaired (05/20/12)    Past Surgical History:  Procedure  Laterality Date   CARDIAC CATHETERIZATION N/A 05/27/2015   Procedure: Left Heart Cath and Coronary Angiography;  Surgeon: Kathleene Hazel, MD;  Location: [facility] INVASIVE CV LAB;  Service: Cardiovascular;  Laterality: N/A;   CORONARY ANGIOPLASTY WITH STENT PLACEMENT  05/11   stent to RCA x 1 and SVG-acute marginal x2. EF normal   CORONARY ANGIOPLASTY WITH STENT PLACEMENT  05/20/12   PTCA/DES x 2 body of SVG to PDA    CORONARY ARTERY BYPASS GRAFT  2004   5v CABG    INCISION AND DRAINAGE OF WOUND  2010   "from bite; maybe snake or spider; almost lost LLE"   LEFT HEART CATHETERIZATION WITH CORONARY/GRAFT ANGIOGRAM N/A 05/20/2012   Procedure: LEFT HEART CATHETERIZATION WITH Isabel Caprice;  Surgeon: Kathleene Hazel, MD;  Location: [facility] CATH LAB;  Service: Cardiovascular;  Laterality: N/A;   LEFT HEART CATHETERIZATION WITH CORONARY/GRAFT ANGIOGRAM N/A 10/30/2013   Procedure: LEFT HEART CATHETERIZATION WITH Isabel Caprice;  Surgeon: Kathleene Hazel, MD;  Location: [facility] CATH LAB;  Service: Cardiovascular;  Laterality: N/A;   PERCUTANEOUS CORONARY STENT INTERVENTION (PCI-S)  10/30/2013   Procedure: PERCUTANEOUS CORONARY STENT INTERVENTION (PCI-S);  Surgeon: Kathleene Hazel, MD;  Location: [facility] CATH LAB;  Service: Cardiovascular;;  PTCA  10/30/2013   DES  SVG     TEE WITHOUT CARDIOVERSION  03/27/2012   Procedure: TRANSESOPHAGEAL ECHOCARDIOGRAM (TEE);  Surgeon: Laurey Morale, MD;  Location: [facility] ENDOSCOPY;  Service: Cardiovascular;  Laterality: N/A;  to be done at 1100    Current Outpatient Medications  Medication Sig Dispense Refill   aspirin EC 81 MG tablet Take 81 mg by mouth daily.     citalopram (CELEXA) 40 MG tablet TAKE 1 TABLET BY MOUTH EVERY DAY 90 tablet 1   cyclobenzaprine (FLEXERIL) 10 MG tablet Take 1 tablet (10 mg total) by mouth 3 (three) times daily as needed for muscle spasms. 90 tablet 0   fluticasone (FLONASE) 50 MCG/ACT nasal spray Place 2 sprays  into both nostrils daily as needed for allergies or rhinitis.     gabapentin (NEURONTIN) 100 MG capsule Take 1 capsule by mouth daily.     methocarbamol (ROBAXIN) 500 MG tablet Take 1 tablet (500 mg total) by mouth 2 (two) times daily. 20 tablet 0   Multiple Vitamin (MULTIVITAMIN WITH MINERALS) TABS tablet Take 1 tablet by mouth daily.     pantoprazole (PROTONIX) 40 MG tablet TAKE 1 TABLET BY MOUTH ONCE DAILY AT 6:00 AM. 90 tablet 1   tetrahydrozoline-zinc (VISINE-AC) 0.05-0.25 % ophthalmic solution Place 2 drops into both eyes 3 (three) times daily as needed (itching/burning).     clopidogrel (PLAVIX) 75 MG tablet Take 1 tablet (75 mg total) by mouth daily. 90 tablet 3   furosemide (LASIX) 20 MG tablet Take 1 tablet (20 mg total) by mouth daily. 90 tablet 3   metoprolol succinate (TOPROL-XL) 25 MG 24 hr tablet Take 1 tablet by mouth daily 90 tablet 3   nitroGLYCERIN (NITROSTAT) 0.4 MG SL tablet DISSOLVE 1 TABLET UNDER TONGUE EVERY 5 MINUTES UP TO 15 MIN FOR CHESTPAIN. IF NO RELIEF CALL 911. 25 tablet 6   oxyCODONE-acetaminophen (PERCOCET/ROXICET) 5-325 MG tablet Take 1 tablet by mouth every 6 (six) hours as needed for severe pain. (Patient not taking: Reported on 07/13/2022) 15 tablet 0   potassium chloride SA (KLOR-CON M20) 20 MEQ tablet TAKE 1 TABLET BY MOUTH EVERY DAY 90 tablet 3   ranolazine (RANEXA) 500 MG 12 hr tablet Take 1 tablet (500 mg total) by mouth 2 (two) times daily. 180 tablet 3   rosuvastatin (CRESTOR) 40 MG tablet Take 1 tablet (40 mg total) by mouth daily. 90 tablet 3   No current facility-administered medications for this visit.    Allergies  Allergen Reactions   Sulfa Antibiotics Rash   Hydrocodone Hives and Itching    Social History   Socioeconomic History   Marital status: Married    Spouse name: Not on file   Number of children: 3   Years of education: Not on file   Highest education level: Not on file  Occupational History   Occupation: Unemployed HVAC   Tobacco Use   Smoking status: Former    Packs/day: 3.00    Years: 40.00    Total pack years: 120.00    Types: Cigarettes    Quit date: 01/23/2003    Years since quitting: 19.4   Smokeless tobacco: Never  Substance and Sexual Activity   Alcohol use: No    Comment: 05/20/12 "last alcohol was 2002; 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 type   Diabetes Father    Hyperlipidemia Father    Hypertension Father    Stomach cancer Father    Heart disease Brother 71       X 5 brothers    Review of Systems:  As stated in the HPI and otherwise negative.   BP 116/74   Pulse 64   Ht 5\' 6"  (1.676 m)   Wt 195 lb 6.4 oz (88.6 kg)   SpO2 98%   BMI 31.54 kg/m   Physical Examination:  General: Well developed, well nourished, NAD  HEENT: OP clear, mucus membranes moist  SKIN: warm, dry. No rashes. Neuro: No focal deficits  Musculoskeletal: Muscle strength 5/5 all ext  Psychiatric: Mood and affect normal  Neck: No JVD, no carotid bruits, no thyromegaly, no lymphadenopathy.  Lungs:Clear bilaterally, no wheezes, rhonci, crackles Cardiovascular: Regular rate and rhythm. No murmurs, gallops or rubs. Abdomen:Soft. Bowel sounds present. Non-tender.  Extremities: No lower extremity edema. Pulses are 2 + in the bilateral DP/PT.  Cardiac cath July 2016: Left Anterior Descending  The vessel is large .    Ost LAD to Prox LAD lesion, 100% stenosed. chronic total occlusion .       Ramus Intermedius  The vessel  is moderate in size .    Ramus lesion, 50% stenosed. diffuse .      Left Circumflex    Second Obtuse Marginal Branch   The vessel is small in size.    Ost 2nd Mrg lesion, 80% stenosed. discrete . Very small caliber vessel.    Third Obtuse Marginal Branch   The vessel is small in size.      Right Coronary Artery    Prox RCA lesion, 30% stenosed. diffuse .    Dist RCA lesion, 90% stenosed. discrete .    Right Posterior Descending Artery   The vessel is small in size.    Right Posterior Atrioventricular Branch   The vessel is small in size.      Graft Angiography     Free LIMA Graft to Mid LAD  LIMA was injected is normal in caliber, and is anatomically normal.      Free Graft to 1st Diag  SVG was injected . There is severe disease in the graft. Graft is chronically occluded.    Origin to Prox Graft lesion, 100% stenosed. chronic total occlusion .      Free Graft to 2nd Mrg  SVG was injected . There is severe disease in the graft. The graft is chronically occluded.    Origin lesion, 100% stenosed. chronic total occlusion .      Free Graft to RPDA  SVG was injected . There is severe disease in the graft. The graft is occluded.    Origin lesion, 100% stenosed. chronic total occlusion .       Coronary Diagrams   Diagnostic Diagram          Echo 12/30/17: Left ventricle: The cavity size was normal. Wall thickness was   normal. Systolic function was normal. The estimated  ejection   fraction was in the range of 55% to 60%. Wall motion was normal;   there were no regional wall motion abnormalities. Left   ventricular diastolic function parameters were normal. - Aortic valve: Valve area (VTI): 2.55 cm^2. Valve area (Vmax): 2.5   cm^2. Valve area (Vmean): 1.85 cm^2. - Mitral valve: There was mild regurgitation. The MR is eccentric   and posteriorally direceted, may be underestimated. The MV/AV VTI   ratio is 1.1, suggesting mild to moderate MR Valve area by   continuity  equation (using LVOT flow): 2.27 cm^2.  EKG:  EKG is not\ ordered today. The ekg ordered today demonstrates   Recent Labs: 04/15/2022: BUN 14; Creatinine, Ser 1.14; Hemoglobin 14.5; Magnesium 1.9; Platelets 181; Potassium 4.0; Sodium 137   Lipid Panel    Component Value Date/Time   CHOL 124 04/19/2022 1030   TRIG 107 04/19/2022 1030   HDL 50 04/19/2022 1030   CHOLHDL 2.5 04/19/2022 1030   CHOLHDL 3.9 02/22/2016 0844   VLDL 40 (H) 02/22/2016 0844   LDLCALC 54 04/19/2022 1030     Wt Readings from Last 3 Encounters:  07/13/22 195 lb 6.4 oz (88.6 kg)  05/09/22 194 lb (88 kg)  04/19/22 197 lb (89.4 kg)     Other studies Reviewed: Additional studies/ records that were reviewed today include: . Review of the above records demonstrates:    Assessment and Plan:   1. CAD with stable angina: No change in chronic chest pain. Will continue Ranexa, ASA, Plavix, beta blocker and statin.       2. HTN: BP is controlled. No changes  3. Hyperlipidemia: LDL at goal in June 2023. Continue statin.     4. Mitral regurgitation: Mild to moderate by echo 2019.  Will repeat echo now.  5. Chronic diastolic CHF: No volume overload on exam. Weight is stable. Continue Lasix  Current medicines are reviewed at length with the patient today.  The patient does not have concerns regarding medicines.  The following changes have been made:  no change  Labs/ tests ordered today include:   Orders Placed This Encounter  Procedures   ECHOCARDIOGRAM COMPLETE    Disposition:   F/U with me in 12 months  Signed, Verne Carrow, MD 07/13/2022 10:30 AM    [facility] [address] [place], [place], [place]  [address] Phone: [phone]; Fax: [phone]

## 2022-07-13 ENCOUNTER — Encounter: Payer: Self-pay | Admitting: Cardiovascular Disease

## 2022-07-13 ENCOUNTER — Ambulatory Visit (INDEPENDENT_AMBULATORY_CARE_PROVIDER_SITE_OTHER): Payer: No Typology Code available for payment source | Admitting: Cardiovascular Disease

## 2022-07-13 VITALS — BP 116/74 | HR 64 | Ht 66.0 in | Wt 195.4 lb

## 2022-07-13 DIAGNOSIS — I340 Nonrheumatic mitral (valve) insufficiency: Secondary | ICD-10-CM | POA: Diagnosis not present

## 2022-07-13 DIAGNOSIS — E7800 Pure hypercholesterolemia, unspecified: Secondary | ICD-10-CM | POA: Diagnosis not present

## 2022-07-13 DIAGNOSIS — I10 Essential (primary) hypertension: Secondary | ICD-10-CM | POA: Diagnosis not present

## 2022-07-13 DIAGNOSIS — I5032 Chronic diastolic (congestive) heart failure: Secondary | ICD-10-CM | POA: Diagnosis not present

## 2022-07-13 DIAGNOSIS — I25118 Atherosclerotic heart disease of native coronary artery with other forms of angina pectoris: Secondary | ICD-10-CM | POA: Diagnosis not present

## 2022-07-13 MED ORDER — ROSUVASTATIN CALCIUM 40 MG PO TABS: 40.0000 mg | ORAL_TABLET | Freq: Every day | ORAL | 3 refills | Status: DC

## 2022-07-13 MED ORDER — RANOLAZINE ER 500 MG PO TB12: 500.0000 mg | ORAL_TABLET | Freq: Two times a day (BID) | ORAL | 3 refills | Status: DC

## 2022-07-13 MED ORDER — FUROSEMIDE 20 MG PO TABS: 20.0000 mg | ORAL_TABLET | Freq: Every day | ORAL | 3 refills | Status: DC

## 2022-07-13 MED ORDER — CLOPIDOGREL BISULFATE 75 MG PO TABS: 75.0000 mg | ORAL_TABLET | Freq: Every day | ORAL | 3 refills | Status: DC

## 2022-07-13 MED ORDER — METOPROLOL SUCCINATE ER 25 MG PO TB24: ORAL_TABLET | ORAL | 3 refills | Status: DC

## 2022-07-13 MED ORDER — POTASSIUM CHLORIDE CRYS ER 20 MEQ PO TBCR: EXTENDED_RELEASE_TABLET | ORAL | 3 refills | Status: DC

## 2022-07-13 MED ORDER — NITROGLYCERIN 0.4 MG SL SUBL: SUBLINGUAL_TABLET | SUBLINGUAL | 6 refills | Status: AC

## 2022-07-13 NOTE — Patient Instructions (Signed)
Medication Instructions:  No changes *If you need a refill on your cardiac medications before your next appointment, please call your pharmacy*   Lab Work: None today If you have labs (blood work) drawn today and your tests are completely normal, you will receive your results only by: MyChart Message (if you have MyChart) OR A paper copy in the mail If you have any lab test that is abnormal or we need to change your treatment, we will call you to review the results.   Testing/Procedures: Your physician has requested that you have an echocardiogram. Echocardiography is a painless test that uses sound waves to create images of your heart. It provides your doctor with information about the size and shape of your heart and how well your heart's chambers and valves are working. This procedure takes approximately one hour. There are no restrictions for this procedure.   Follow-Up: At [facility], you and your health needs are our priority.  As part of our continuing mission to provide you with exceptional heart care, we have created designated Provider Care Teams.  These Care Teams include your primary Cardiologist (physician) and Advanced Practice Providers (APPs -  Physician Assistants and Nurse Practitioners) who all work together to provide you with the care you need, when you need it.  We recommend signing up for the patient portal called "MyChart".  Sign up information is provided on this After Visit Summary.  MyChart is used to connect with patients for Virtual Visits (Telemedicine).  Patients are able to view lab/test results, encounter notes, upcoming appointments, etc.  Non-urgent messages can be sent to your provider as well.   To learn more about what you can do with MyChart, go to [email or web address].    Your next appointment:   12 month(s)  The format for your next appointment:   In Person  Provider:   Verne Carrow, MD     Important Information About  Sugar

## 2022-07-26 ENCOUNTER — Ambulatory Visit (HOSPITAL_COMMUNITY)
Admission: RE | Admit: 2022-07-26 | Discharge: 2022-07-26 | Disposition: A | Discharge status: Home / Self Care | Payer: No Typology Code available for payment source | Source: Ambulatory Visit | Attending: Cardiovascular Disease | Admitting: Cardiovascular Disease

## 2022-07-26 DIAGNOSIS — I340 Nonrheumatic mitral (valve) insufficiency: Secondary | ICD-10-CM | POA: Diagnosis not present

## 2022-07-26 DIAGNOSIS — I25118 Atherosclerotic heart disease of native coronary artery with other forms of angina pectoris: Secondary | ICD-10-CM

## 2022-07-26 DIAGNOSIS — I5032 Chronic diastolic (congestive) heart failure: Secondary | ICD-10-CM

## 2022-07-26 LAB — ECHOCARDIOGRAM COMPLETE
AR max vel: 2.41 cm2
AV Area VTI: 2.51 cm2
AV Area mean vel: 2.62 cm2
AV Mean grad: 3.3 mmHg
AV Peak grad: 7.6 mmHg
Ao pk vel: 1.38 m/s
Area-P 1/2: 3.65 cm2
S' Lateral: 3.5 cm

## 2022-07-26 NOTE — Progress Notes (Signed)
*  PRELIMINARY RESULTS* Echocardiogram 2D Echocardiogram has been performed.  Frank [name] 07/26/2022, 10:15 AM

## 2022-09-03 ENCOUNTER — Other Ambulatory Visit: Payer: Self-pay | Admitting: Physician Assistant

## 2022-12-02 ENCOUNTER — Other Ambulatory Visit: Payer: Self-pay | Admitting: Family Medicine

## 2022-12-04 ENCOUNTER — Telehealth: Payer: Self-pay | Admitting: Family Medicine

## 2022-12-04 NOTE — Telephone Encounter (Signed)
Left message for patient to call back and schedule Medicare Annual Wellness Visit (AWV) in office.   If not able to come in office, please offer to do virtually or by telephone.  Left office number and my jabber [phone].  AWVI eligible as of : 03/19/2014  Please schedule at anytime with Nurse Health Advisor.

## 2023-03-01 ENCOUNTER — Telehealth: Payer: Self-pay | Admitting: Family Medicine

## 2023-03-01 NOTE — Telephone Encounter (Signed)
Called patient to schedule Medicare Annual Wellness Visit (AWV). Left message for patient to call back and schedule Medicare Annual Wellness Visit (AWV).  Last date of AWV: AWVI eligible as of 03/19/2014   Please schedule an AWVI appointment at any time with [facility] VISIT.  If any questions, please contact me at [phone].   Thank you,  [facility] Support [facility] Direct dial  [phone]

## 2023-04-08 DIAGNOSIS — H5203 Hypermetropia, bilateral: Secondary | ICD-10-CM | POA: Diagnosis not present

## 2023-04-08 DIAGNOSIS — H524 Presbyopia: Secondary | ICD-10-CM | POA: Diagnosis not present

## 2023-04-08 DIAGNOSIS — H52223 Regular astigmatism, bilateral: Secondary | ICD-10-CM | POA: Diagnosis not present

## 2023-04-22 ENCOUNTER — Ambulatory Visit: Payer: No Typology Code available for payment source | Admitting: Family Medicine

## 2023-05-01 ENCOUNTER — Ambulatory Visit (INDEPENDENT_AMBULATORY_CARE_PROVIDER_SITE_OTHER): Payer: No Typology Code available for payment source | Admitting: Family Medicine

## 2023-05-01 ENCOUNTER — Encounter: Payer: Self-pay | Admitting: Family Medicine

## 2023-05-01 VITALS — BP 118/75 | HR 66 | Ht 66.0 in | Wt 199.2 lb

## 2023-05-01 DIAGNOSIS — R739 Hyperglycemia, unspecified: Secondary | ICD-10-CM

## 2023-05-01 DIAGNOSIS — R5383 Other fatigue: Secondary | ICD-10-CM | POA: Diagnosis not present

## 2023-05-01 DIAGNOSIS — Z0000 Encounter for general adult medical examination without abnormal findings: Secondary | ICD-10-CM | POA: Diagnosis not present

## 2023-05-01 DIAGNOSIS — R400 Somnolence: Secondary | ICD-10-CM | POA: Diagnosis not present

## 2023-05-01 DIAGNOSIS — E785 Hyperlipidemia, unspecified: Secondary | ICD-10-CM

## 2023-05-01 NOTE — Progress Notes (Signed)
    SUBJECTIVE:   CHIEF COMPLAINT / HPI:  Chief Complaint  Patient presents with   Fatigue    Here for physical.  Reports feeling fatigued for the past 1-2 years. Known to snore at night by his wife. Reports daytime somnolence. Sleep is so-so. Denies chest pain, SOB. He has follow-up with cardiologist McAlhany in 2 months.  Reports acid reflux is well-controlled on pantoprazole.  He will occasionally have breakthrough symptoms to be on what he eats.  Former smoker, quit 2004. Drinks 1L diet [employer] daily. Walking about a mile every day. He is retired, formerly worked in Hospital doctor.  PERTINENT  PMH / PSH: CAD, HTN, HLD, mild mitral regurgitation, HFpEF, CVA  Patient Care Team: Littie Deeds, MD as PCP - General (Family Medicine) Kathleene Hazel, MD as PCP - Cardiology (Cardiology)   OBJECTIVE:   BP 118/75   Pulse 66   Ht 5\' 6"  (1.676 m)   Wt 199 lb 3.2 oz (90.4 kg)   SpO2 98%   BMI 32.15 kg/m   Physical Exam Constitutional:      General: He is not in acute distress.    Appearance: He is obese.  HENT:     Head: Normocephalic and atraumatic.  Cardiovascular:     Rate and Rhythm: Normal rate and regular rhythm.  Pulmonary:     Effort: Pulmonary effort is normal. No respiratory distress.     Breath sounds: Normal breath sounds.  Neurological:     Mental Status: He is alert.         05/01/2023    8:28 AM  Depression screen PHQ 2/9  Decreased Interest 2  Down, Depressed, Hopeless 0  PHQ - 2 Score 2  Altered sleeping 3  Tired, decreased energy 3  Change in appetite 0  Feeling bad or failure about yourself  0  Trouble concentrating 0  Moving slowly or fidgety/restless 0  Suicidal thoughts 0  PHQ-9 Score 8  Difficult doing work/chores Not difficult at all     {Show previous vital signs (optional):23777}    ASSESSMENT/PLAN:   1. Encounter for routine history and physical examination of adult Discussed Tdap vaccination.  Up-to-date on  colorectal cancer screening (Cologuard).  2. Fatigue, unspecified type Ongoing problem for about 2 years, will repeat lab work.  Prior labs reviewed without any significant abnormalities. - CBC - Comprehensive metabolic panel  3. Daytime somnolence Daytime somnolence and snoring, could be a sleep apnea contributing to fatigue.  STOP-BANG score intermediate.  Will obtain sleep study. - Split night study; Future  4. Dyslipidemia On statin - Lipid Panel  5. Hyperglycemia Previously had elevated A1c 6.5, will repeat today. - Hemoglobin A1c    Return in about 1 year (around 04/30/2024) for physical.   Littie Deeds, MD [facility]

## 2023-05-01 NOTE — Patient Instructions (Addendum)
It was nice seeing you today!  We will set you up for a sleep study to look for sleep apnea. They will call you to get this set up.  Stay well, Littie Deeds, MD [facility] [phone]  --  Make sure to check out at the front desk before you leave today.  Please arrive at least 15 minutes prior to your scheduled appointments.  If you had blood work today, I will send you a MyChart message or a letter if results are normal. Otherwise, I will give you a call.  If you had a referral placed, they will call you to set up an appointment. Please give [place] a call if you don't hear back in the next 2 weeks.  If you need additional refills before your next appointment, please call your pharmacy first.

## 2023-05-02 LAB — CBC
Hematocrit: 47.2 % (ref 37.5–51.0)
Hemoglobin: 15.2 g/dL (ref 13.0–17.7)
MCH: 28.9 pg (ref 26.6–33.0)
MCHC: 32.2 g/dL (ref 31.5–35.7)
MCV: 90 fL (ref 79–97)
Platelets: 183 10*3/uL (ref 150–450)
RBC: 5.26 x10E6/uL (ref 4.14–5.80)
RDW: 12.8 % (ref 11.6–15.4)
WBC: 7.8 10*3/uL (ref 3.4–10.8)

## 2023-05-02 LAB — COMPREHENSIVE METABOLIC PANEL
ALT: 23 IU/L (ref 0–44)
AST: 24 IU/L (ref 0–40)
Albumin/Globulin Ratio: 1.8
Albumin: 4.4 g/dL (ref 3.9–4.9)
Alkaline Phosphatase: 116 IU/L (ref 44–121)
BUN/Creatinine Ratio: 9 — ABNORMAL LOW (ref 10–24)
BUN: 10 mg/dL (ref 8–27)
Bilirubin Total: 0.4 mg/dL (ref 0.0–1.2)
CO2: 23 mmol/L (ref 20–29)
Calcium: 9 mg/dL (ref 8.6–10.2)
Chloride: 102 mmol/L (ref 96–106)
Creatinine, Ser: 1.16 mg/dL (ref 0.76–1.27)
Globulin, Total: 2.4 g/dL (ref 1.5–4.5)
Glucose: 133 mg/dL — ABNORMAL HIGH (ref 70–99)
Potassium: 4.6 mmol/L (ref 3.5–5.2)
Sodium: 140 mmol/L (ref 134–144)
Total Protein: 6.8 g/dL (ref 6.0–8.5)
eGFR: 69 mL/min/{1.73_m2} (ref >59)

## 2023-05-02 LAB — LIPID PANEL
Chol/HDL Ratio: 2.9 ratio (ref 0.0–5.0)
Cholesterol, Total: 141 mg/dL (ref 100–199)
HDL: 48 mg/dL (ref >39)
LDL Chol Calc (NIH): 60 mg/dL (ref 0–99)
Triglycerides: 199 mg/dL — ABNORMAL HIGH (ref 0–149)
VLDL Cholesterol Cal: 33 mg/dL (ref 5–40)

## 2023-05-02 LAB — HEMOGLOBIN A1C
Est. average glucose Bld gHb Est-mCnc: 163 mg/dL
Hgb A1c MFr Bld: 7.3 % — ABNORMAL HIGH (ref 4.8–5.6)

## 2023-05-03 ENCOUNTER — Telehealth: Payer: Self-pay | Admitting: Family Medicine

## 2023-05-03 NOTE — Telephone Encounter (Signed)
Attempted to call regarding lab results x 3.  Will attempt again as able.

## 2023-05-07 ENCOUNTER — Telehealth: Payer: Self-pay | Admitting: Family Medicine

## 2023-05-07 DIAGNOSIS — E119 Type 2 diabetes mellitus without complications: Secondary | ICD-10-CM

## 2023-05-07 NOTE — Telephone Encounter (Signed)
Left voicemail to discuss lab results. 

## 2023-05-10 DIAGNOSIS — E119 Type 2 diabetes mellitus without complications: Secondary | ICD-10-CM | POA: Insufficient documentation

## 2023-05-10 MED ORDER — METFORMIN HCL ER 500 MG PO TB24
500.0000 mg | ORAL_TABLET | Freq: Every day | ORAL | 0 refills | Status: AC
Start: 2023-05-10 — End: ?

## 2023-05-10 NOTE — Addendum Note (Signed)
Addended by: Littie Deeds D on: 05/10/2023 05:53 PM   Modules accepted: Orders

## 2023-05-10 NOTE — Telephone Encounter (Signed)
Patient returns call to nurse line regards lab results.   Patient reports if he does not answer it is perfectly fine to leave a detailed message with results and plan.   Will forward to PCP.

## 2023-05-10 NOTE — Telephone Encounter (Signed)
Called patient to discuss lab results.  CBC, CMP, lipid panel largely unremarkable.  A1c is elevated at 7.3 with previous A1c of 6.5 about a year ago, discussed diagnosis of diabetes.  I recommended that he reduce his carbohydrate intake.  I also discussed starting a medication called metformin which she was amenable to.  I recommended that he follow-up in 3 months and continue every 58-month follow-up for the time being for diabetes.  At follow-up, he will need further discussion of new diagnosis of diabetes including footcare, eye exams, etc.  Will send messages to admin team to place on recall list for 18-month diabetic follow-up.

## 2023-05-20 ENCOUNTER — Telehealth: Payer: Self-pay

## 2023-05-20 DIAGNOSIS — E119 Type 2 diabetes mellitus without complications: Secondary | ICD-10-CM

## 2023-05-20 NOTE — Telephone Encounter (Signed)
Received call from patient's mother requesting glucometer and supplies.   Patient was recently seen in the office and was prescribed metformin.   Patient would like to be able to monitor blood sugar levels from home.   Please send to [facility].   Veronda Prude, RN

## 2023-05-21 ENCOUNTER — Other Ambulatory Visit: Payer: Self-pay | Admitting: Student

## 2023-05-21 DIAGNOSIS — E119 Type 2 diabetes mellitus without complications: Secondary | ICD-10-CM

## 2023-05-21 MED ORDER — ONETOUCH ULTRASOFT LANCETS MISC
12 refills | Status: DC
Start: 2023-05-21 — End: 2023-05-22

## 2023-05-21 MED ORDER — ONETOUCH VERIO FLEX SYSTEM W/DEVICE KIT
1.0000 | PACK | Freq: Every day | 0 refills | Status: DC | PRN
Start: 2023-05-21 — End: 2024-05-08

## 2023-05-21 NOTE — Telephone Encounter (Signed)
Received call from patient's mother requesting glucometer and supplies. Patient was recently seen in the office and was prescribed metformin.  Patient would like to be able to monitor blood sugar levels from home.   Unclear which brand is covered under his insurance plan. Will attempt OneTouch, based on review of [employer].

## 2023-06-28 NOTE — Progress Notes (Unsigned)
Cardiology Office Note:  .   Date:  06/28/2023  ID:  [name], DOB 1955-12-04, MRN [id number] PCP: Tiffany Kocher, DO  [facility] Providers Cardiologist:  Verne Carrow, MD { Click to update primary MD,subspecialty MD or APP then REFRESH:1}   History of Present Illness: .   Frank [name] is a 67 y.o. male with past medical history of CAD s/p CABGx4 in 2004 with subsequent PCI in 2011 and 2014, HLD, HTN and mitral regurgitation. He presents today for follow up regarding CAD.   Cardiac catheterization in 05/20/12 demonstrated severe disease in the SVG to the PDA and 2 DES were placed in the SVG to PDA. The LIMA was patent but the vein grafts to the OM, Diagonal and PLA were occluded. In 10/2013 he had recurrence of angina, subsequent cardiac cath indicated patent LIMA to LAD, patent SVG to PDA with severe disease, unchanged disease in native vessels, 2 DES were placed in the SVG to the PDA. In 04/2015 cardiac cath indicated new occlusion of the SVG to PDA, only patent graft was the LIMA to LAD, no targets for PCI.   On 05/16/2022 he presented to the emergency room for neck pain.  CT scan of the head indicated possible remote infarct. Follow up MRI indicated remote infarct in the right caudate/lentiform nucleus. 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 PCP recommended follow up with neurology.   Last seen by Dr. Clifton James on 07/13/2022 for follow up. He had remained stable from a cardiac standpoint. He underwent repeat echocardiogram in 07/2022 that indicated EF 55 to 60%, LV with normal function and no RWMA, LV diastolic parameters were normal.  CAD with stable angina: S/p CABGx4 (LIMA to LAD, SVG to diagonal, SVG to OM, SVG to PDA)  in 2004, cardiac cath in 2013 resulted in 2 DES to the SVG to PDA and cardiac cath in 10/2013 indicated recurrent severe disease in the SVG to PDA with subsequent DESx2.  Most recent cardiac cath in 05/2015 indicated new occlusion  of SVG to PDA.  The only patent graft was the LIMA to LAD, diffuse disease in native vessels, no target for PCI. Echocardiogram on 07/26/2022 indicated EF 55 to 60%, LV with normal function and no RWMA, LV diastolic parameters were normal. Continue  HTN: Blood pressure today: Continue  Hyperlipidemia: Last lipid profile on 05/01/2023 indicated total cholesterol of 141, triglycerides 199 and LDL of 60. Continue  Mitral regurgitation: Mild mitral valve regurgitation was noted on last echo on 07/26/2022. Chronic diastolic CHF: Echocardiogram on 07/26/2022 indicated EF 55 to 60%, LV with normal function and no RWMA, LV diastolic parameters were normal. Euvolemic? Continue lasix Diabetes: A1C 7.3 on 05/01/23. Monitored and managed by PCP.  Daytime fatigue: Recent lab results normal, planning to undergo sleep study with PCP.  Remote stroke: In 04/2022 patient reported to the ED ___.  MRI of the brain completed by PCP indicated remote infarct in the right caudate/lentiform nucleus. 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 PCP recommended follow up with neurology.   Recent labs 05/01/23:  Sodium 140, potassium 4.6, creatinine 1.16, eGFR 69, AST 24, ALT 23, HGB 15.2, HCT 47.2   ROS: ****** Today he denies chest pain, shortness of breath, lower extremity edema, fatigue, palpitations, melena, hematuria, hemoptysis, diaphoresis, weakness, presyncope, syncope, orthopnea, and PND.   Studies Reviewed: .       Cardiac Studies & Procedures   CARDIAC CATHETERIZATION  CARDIAC CATHETERIZATION 05/27/2015  Narrative  The left ventricular systolic function is normal.  Ost 2nd Mrg lesion, 80% stenosed.  Ramus lesion, 50% stenosed.  Ost LAD to Prox LAD lesion, 100% stenosed.  LIMA was injected is normal in caliber, and is anatomically normal.  SVG was injected .  There is severe disease in the graft.  Graft is chronically occluded.  SVG was injected .  There is severe  disease in the graft.  The graft is chronically occluded.  Origin to Prox Graft lesion, 100% stenosed.  Origin lesion, 100% stenosed.  Dist RCA lesion, 90% stenosed.  SVG was injected .  There is severe disease in the graft.  The graft is occluded.  Origin lesion, 100% stenosed.  Prox RCA lesion, 30% stenosed.  1. 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 The vein graft to the PDA is occluded since last cath. 2. Occluded proximal LAD with filling of the mid and distal vessel by the patent LIMA graft. Occluded SVG to Diagonal. 3. Moderate disease in the moderate caliber intermediate branch, unchanged from last cath. Occluded SVG to OM 4. Mild disease in the AV groove Circumflex 5. Severe stenosis in the small caliber posterolateral branch at the site of the insertion of the vein graft. PCI of this vessel was attempted in 2014 but was not successful. SVG to PDA is now occluded. 6. Normal LV systolic function 7. Moderate mitral regurgitation  Recommendations: Will continue medical management of CAD  Findings Coronary Findings Diagnostic  Dominance: Right  Left Anterior Descending The vessel is large . chronic total occlusion .  Ramus Intermedius The vessel is moderate in size . diffuse .  Left Circumflex  Second Obtuse Marginal Branch The vessel is small in size. discrete .  Very small caliber vessel.  Third Obtuse Marginal Branch The vessel is small in size.  Right Coronary Artery diffuse . discrete .  Right Posterior Descending Artery The vessel is small in size.  Right Posterior Atrioventricular Artery The vessel is small in size.  LIMA LIMA Graft To Mid LAD LIMA was injected is normal in caliber, and is anatomically normal.  Single Graft Graft To 1st Diag SVG was injected . There is severe disease in the graft. Graft is chronically occluded. chronic total occlusion .  Single Graft Graft To 2nd Mrg SVG was injected . There  is severe disease in the graft. The graft is chronically occluded. chronic total occlusion .  Single Graft Graft To RPDA SVG was injected . There is severe disease in the graft. The graft is occluded. chronic total occlusion .  Intervention  No interventions have been documented.     ECHOCARDIOGRAM  ECHOCARDIOGRAM COMPLETE 07/26/2022  Narrative ECHOCARDIOGRAM REPORT    Patient Name:   Frank [name] Date of Exam: 07/26/2022 Medical Rec #:  [id number]     Height:       66.0 in Accession #:    [id number]    Weight:       195.4 lb Date of Birth:  Apr 12, 1956     BSA:          1.980 m Patient Age:    66 years      BP:           119/68 mmHg Patient Gender: M             HR:           70 bpm. [facility]  Procedure: 2D Echo, Cardiac Doppler and Color Doppler  Indications:    I50.32 (ICD-10-CM) - Chronic diastolic CHF (congestive heart failure), NYHA class 2, Mitral Valve Disorder I05.9  History:        Patient has prior history of Echocardiogram examinations, most recent 12/30/2017. Previous Myocardial Infarction and CAD, Prior CABG, Stroke; Risk Factors:Hypertension, Dyslipidemia and Former Smoker. Hx of COVID-19.  Sonographer:    Celesta Gentile RCS Referring Phys: [phone] CHRISTOPHER D [facility]   Sonographer Comments: Suboptimal subcostal window. IMPRESSIONS   1. Left ventricular ejection fraction, by estimation, is 55 to 60%. The left ventricle has normal function. The left ventricle has no regional wall motion abnormalities. Left ventricular diastolic parameters were normal. 2. Right ventricular systolic function is normal. The right ventricular size is normal. Tricuspid regurgitation signal is inadequate for assessing PA pressure. 3. The mitral valve is normal in structure. Mild mitral valve regurgitation. No evidence of mitral stenosis. 4. The aortic valve is tricuspid. There is mild calcification of the aortic valve. There is mild thickening of the aortic valve.  Aortic valve regurgitation is not visualized. No aortic stenosis is present.  FINDINGS Left Ventricle: Left ventricular ejection fraction, by estimation, is 55 to 60%. The left ventricle has normal function. The left ventricle has no regional wall motion abnormalities. The left ventricular internal cavity size was normal in size. There is no left ventricular hypertrophy. Left ventricular diastolic parameters were normal.  Right Ventricle: The right ventricular size is normal. Right vetricular wall thickness was not well visualized. Right ventricular systolic function is normal. Tricuspid regurgitation signal is inadequate for assessing PA pressure.  Left Atrium: Left atrial size was normal in size.  Right Atrium: Right atrial size was normal in size.  Pericardium: There is no evidence of pericardial effusion.  Mitral Valve: The mitral valve is normal in structure. There is mild thickening of the mitral valve leaflet(s). There is mild calcification of the mitral valve leaflet(s). Mild mitral annular calcification. Mild mitral valve regurgitation. No evidence of mitral valve stenosis.  Tricuspid Valve: The tricuspid valve is normal in structure. Tricuspid valve regurgitation is not demonstrated. No evidence of tricuspid stenosis.  Aortic Valve: The aortic valve is tricuspid. There is mild calcification of the aortic valve. There is mild thickening of the aortic valve. There is mild aortic valve annular calcification. Aortic valve regurgitation is not visualized. No aortic stenosis is present. Aortic valve mean gradient measures 3.3 mmHg. Aortic valve peak gradient measures 7.6 mmHg. Aortic valve area, by VTI measures 2.51 cm.  Pulmonic Valve: The pulmonic valve was not well visualized. Pulmonic valve regurgitation is not visualized. No evidence of pulmonic stenosis.  Aorta: The aortic root is normal in size and structure.  Venous: The inferior vena cava was not well  visualized.  IAS/Shunts: The interatrial septum was not well visualized.   LEFT VENTRICLE PLAX 2D LVIDd:         5.10 cm   Diastology LVIDs:         3.50 cm   LV e' medial:    9.36 cm/s LV PW:         0.90 cm   LV E/e' medial:  11.5 LV IVS:        0.90 cm   LV e' lateral:   11.40 cm/s LVOT diam:     2.00 cm   LV E/e' lateral: 9.5 LV SV:         68 LV SV Index:   34 LVOT Area:  3.14 cm   RIGHT VENTRICLE RV S prime:     10.80 cm/s TAPSE (M-mode): 1.5 cm  LEFT ATRIUM             Index        RIGHT ATRIUM           Index LA diam:        3.60 cm 1.82 cm/m   RA Area:     18.20 cm LA Vol (A2C):   50.3 ml 25.40 ml/m  RA Volume:   50.20 ml  25.35 ml/m LA Vol (A4C):   52.9 ml 26.72 ml/m LA Biplane Vol: 51.3 ml 25.91 ml/m AORTIC VALVE AV Area (Vmax):    2.41 cm AV Area (Vmean):   2.62 cm AV Area (VTI):     2.51 cm AV Vmax:           137.69 cm/s AV Vmean:          81.863 cm/s AV VTI:            0.271 m AV Peak Grad:      7.6 mmHg AV Mean Grad:      3.3 mmHg LVOT Vmax:         105.50 cm/s LVOT Vmean:        68.350 cm/s LVOT VTI:          0.216 m LVOT/AV VTI ratio: 0.80  AORTA Ao Root diam: 3.00 cm  MITRAL VALVE MV Area (PHT): 3.65 cm     SHUNTS MV Decel Time: 208 msec     Systemic VTI:  0.22 m MV E velocity: 108.00 cm/s  Systemic Diam: 2.00 cm MV A velocity: 89.10 cm/s MV E/A ratio:  1.21  Dina Rich MD Electronically signed by Dina Rich MD Signature Date/Time: 07/26/2022/10:56:00 AM    Final             *** Risk Assessment/Calculations:   {Does this patient have ATRIAL FIBRILLATION?:[phone]} No BP recorded.  {Refresh Note OR Click here to enter BP  :1}***       Physical Exam:   VS:  There were no vitals taken for this visit.   Wt Readings from Last 3 Encounters:  05/01/23 199 lb 3.2 oz (90.4 kg)  07/13/22 195 lb 6.4 oz (88.6 kg)  05/09/22 194 lb (88 kg)    GEN: Well nourished, well developed in no acute distress NECK: No JVD; No  carotid bruits CARDIAC: ***RRR, no murmurs, rubs, gallops RESPIRATORY:  Clear to auscultation without rales, wheezing or rhonchi  ABDOMEN: Soft, non-tender, non-distended EXTREMITIES:  No edema; No deformity   ASSESSMENT AND PLAN: .   ***    {Are you ordering a CV Procedure (e.g. stress test, cath, DCCV, TEE, etc)?   Press F2        :[id number]}  Dispo: ***  Signed, Rip Harbour, NP

## 2023-06-30 ENCOUNTER — Encounter: Payer: Self-pay | Admitting: Physician Assistant

## 2023-07-01 ENCOUNTER — Encounter: Payer: Self-pay | Admitting: Physician Assistant

## 2023-07-01 ENCOUNTER — Ambulatory Visit: Payer: No Typology Code available for payment source | Attending: Physician Assistant | Admitting: Cardiology

## 2023-07-01 VITALS — BP 118/68 | HR 63 | Ht 67.0 in | Wt 199.4 lb

## 2023-07-01 DIAGNOSIS — I5032 Chronic diastolic (congestive) heart failure: Secondary | ICD-10-CM | POA: Diagnosis not present

## 2023-07-01 DIAGNOSIS — R0609 Other forms of dyspnea: Secondary | ICD-10-CM | POA: Diagnosis not present

## 2023-07-01 DIAGNOSIS — I340 Nonrheumatic mitral (valve) insufficiency: Secondary | ICD-10-CM | POA: Diagnosis not present

## 2023-07-01 DIAGNOSIS — R5383 Other fatigue: Secondary | ICD-10-CM | POA: Diagnosis not present

## 2023-07-01 DIAGNOSIS — E785 Hyperlipidemia, unspecified: Secondary | ICD-10-CM

## 2023-07-01 DIAGNOSIS — I25708 Atherosclerosis of coronary artery bypass graft(s), unspecified, with other forms of angina pectoris: Secondary | ICD-10-CM | POA: Diagnosis not present

## 2023-07-01 DIAGNOSIS — Z8673 Personal history of transient ischemic attack (TIA), and cerebral infarction without residual deficits: Secondary | ICD-10-CM

## 2023-07-01 DIAGNOSIS — I10 Essential (primary) hypertension: Secondary | ICD-10-CM | POA: Diagnosis not present

## 2023-07-01 MED ORDER — EZETIMIBE 10 MG PO TABS: 10.0000 mg | ORAL_TABLET | Freq: Every day | ORAL | 3 refills | Status: DC

## 2023-07-01 NOTE — Patient Instructions (Addendum)
Medication Instructions:  Start Ezetimibe (Zetia) 10 mg daily   *If you need a refill on your cardiac medications before your next appointment, please call your pharmacy*   Lab Work: Your physician recommends that you return for a FASTING lipid profile and hepatic function test on Monday, October 7, lab is open from 7:15 am to 4:30 pm   If you have labs (blood work) drawn today and your tests are completely normal, you will receive your results only by: MyChart Message (if you have MyChart) OR A paper copy in the mail If you have any lab test that is abnormal or we need to change your treatment, we will call you to review the results.   Testing/Procedures: None ordered    Follow-Up: At [facility], you and your health needs are our priority.  As part of our continuing mission to provide you with exceptional heart care, we have created designated Provider Care Teams.  These Care Teams include your primary Cardiologist (physician) and Advanced Practice Providers (APPs -  Physician Assistants and Nurse Practitioners) who all work together to provide you with the care you need, when you need it.  We recommend signing up for the patient portal called "MyChart".  Sign up information is provided on this After Visit Summary.  MyChart is used to connect with patients for Virtual Visits (Telemedicine).  Patients are able to view lab/test results, encounter notes, upcoming appointments, etc.  Non-urgent messages can be sent to your provider as well.   To learn more about what you can do with MyChart, go to [email or web address].    Your next appointment:   6 month(s)  Provider:   Verne Carrow, MD   or APP  Other Instructions

## 2023-07-09 ENCOUNTER — Other Ambulatory Visit: Payer: Self-pay | Admitting: Cardiovascular Disease

## 2023-07-30 ENCOUNTER — Other Ambulatory Visit: Payer: Self-pay

## 2023-07-31 MED ORDER — CITALOPRAM HYDROBROMIDE 40 MG PO TABS: 40.0000 mg | ORAL_TABLET | Freq: Every day | ORAL | 1 refills | Status: DC

## 2023-08-16 ENCOUNTER — Other Ambulatory Visit: Payer: Self-pay

## 2023-08-17 MED ORDER — METFORMIN HCL ER 500 MG PO TB24: 500.0000 mg | ORAL_TABLET | Freq: Every day | ORAL | 0 refills | Status: DC

## 2023-08-26 ENCOUNTER — Ambulatory Visit: Payer: No Typology Code available for payment source | Attending: Student

## 2023-08-31 ENCOUNTER — Other Ambulatory Visit: Payer: Self-pay | Admitting: Cardiovascular Disease

## 2023-09-08 ENCOUNTER — Other Ambulatory Visit: Payer: Self-pay | Admitting: Cardiovascular Disease

## 2023-09-18 ENCOUNTER — Other Ambulatory Visit: Payer: Self-pay | Admitting: Cardiovascular Disease

## 2023-09-27 DIAGNOSIS — I509 Heart failure, unspecified: Secondary | ICD-10-CM | POA: Diagnosis not present

## 2023-09-27 DIAGNOSIS — I110 Hypertensive heart disease with heart failure: Secondary | ICD-10-CM | POA: Diagnosis not present

## 2023-09-27 DIAGNOSIS — Z8673 Personal history of transient ischemic attack (TIA), and cerebral infarction without residual deficits: Secondary | ICD-10-CM | POA: Diagnosis not present

## 2023-09-27 DIAGNOSIS — F17211 Nicotine dependence, cigarettes, in remission: Secondary | ICD-10-CM | POA: Diagnosis not present

## 2023-09-27 DIAGNOSIS — E1169 Type 2 diabetes mellitus with other specified complication: Secondary | ICD-10-CM | POA: Diagnosis not present

## 2023-09-27 DIAGNOSIS — F324 Major depressive disorder, single episode, in partial remission: Secondary | ICD-10-CM | POA: Diagnosis not present

## 2023-09-27 DIAGNOSIS — E785 Hyperlipidemia, unspecified: Secondary | ICD-10-CM | POA: Diagnosis not present

## 2023-09-27 DIAGNOSIS — Z008 Encounter for other general examination: Secondary | ICD-10-CM | POA: Diagnosis not present

## 2023-09-27 DIAGNOSIS — E669 Obesity, unspecified: Secondary | ICD-10-CM | POA: Diagnosis not present

## 2023-09-27 DIAGNOSIS — Z6831 Body mass index (BMI) 31.0-31.9, adult: Secondary | ICD-10-CM | POA: Diagnosis not present

## 2023-12-06 ENCOUNTER — Other Ambulatory Visit: Payer: Self-pay | Admitting: Student

## 2024-02-05 ENCOUNTER — Encounter (HOSPITAL_COMMUNITY): Payer: Self-pay

## 2024-02-05 ENCOUNTER — Emergency Department (HOSPITAL_COMMUNITY)
Admission: EM | Admit: 2024-02-05 | Discharge: 2024-02-05 | Disposition: A | Discharge status: Home / Self Care | Attending: Emergency Medicine | Admitting: Emergency Medicine

## 2024-02-05 ENCOUNTER — Other Ambulatory Visit: Payer: Self-pay

## 2024-02-05 DIAGNOSIS — Z7982 Long term (current) use of aspirin: Secondary | ICD-10-CM | POA: Diagnosis not present

## 2024-02-05 DIAGNOSIS — Z7901 Long term (current) use of anticoagulants: Secondary | ICD-10-CM | POA: Insufficient documentation

## 2024-02-05 DIAGNOSIS — S60445A External constriction of left ring finger, initial encounter: Secondary | ICD-10-CM | POA: Insufficient documentation

## 2024-02-05 DIAGNOSIS — S60449A External constriction of unspecified finger, initial encounter: Secondary | ICD-10-CM

## 2024-02-05 DIAGNOSIS — W4904XA Ring or other jewelry causing external constriction, initial encounter: Secondary | ICD-10-CM | POA: Diagnosis not present

## 2024-02-05 NOTE — ED Triage Notes (Signed)
 Pt arrived via POV c/o left ring finger swelling following a cut while working on his vehicle. Pt reports he cannot remove his wedding band now. Pt reports yesterday he received his Tetanus shot.

## 2024-02-05 NOTE — ED Provider Notes (Addendum)
 [place] EM[facility]GENC[facility] DEPARTMENT AT [facility] Provider Note   CSN: [id number] Arrival date & time: 02/05/24  1150     History Chief Complaint  Patient presents with   Hand Pain    Frank [name] is a 68 y.o. male presents to the ER today for evaluation of ring stuck on his left ring finger since yesterday. The patient reports that he was changing the battery in his car and his ring hit one of the grounding parts. He has a small blister to the dorsal aspect of the finger. He reports that his tetanus was updated earlier that morning. Today, he was working out in the shop and sustained a small scratch to his more proximal finger, just below the ring. Denies any crush or blunt injury to the finger. Reports some pain, but was more concerned that he couldn't remove it. Denies any numbness or tingling.    Hand Pain       Home Medications Prior to Admission medications   Medication Sig Start Date End Date Taking? Authorizing Provider  aspirin EC 81 MG tablet Take 81 mg by mouth daily.    [provider]  Blood Glucose Monitoring Suppl (ONETOUCH VERIO FLEX SYSTEM) w/Device KIT 1 kit by Does not apply route daily as needed. 05/21/23   Tiffany Kocher, DO  citalopram (CELEXA) 40 MG tablet TAKE 1 TABLET BY MOUTH EVERY DAY 12/06/23   Tiffany Kocher, DO  clopidogrel (PLAVIX) 75 MG tablet TAKE 1 TABLET BY MOUTH EVERY DAY 07/09/23   Kathleene Hazel, MD  ezetimibe (ZETIA) 10 MG tablet Take 1 tablet (10 mg total) by mouth daily. 07/01/23 09/29/23  Reather Littler D, NP  furosemide (LASIX) 20 MG tablet TAKE 1 TABLET BY MOUTH EVERY DAY 09/02/23   Kathleene Hazel, MD  gabapentin (NEURONTIN) 100 MG capsule Take 1 capsule by mouth daily. 07/01/18   [provider]  metFORMIN (GLUCOPHAGE-XR) 500 MG 24 hr tablet Take 1 tablet (500 mg total) by mouth daily with breakfast. 08/17/23   Tiffany Kocher, DO  metoprolol succinate (TOPROL-XL) 25 MG 24 hr tablet TAKE 1 TABLET BY  MOUTH EVERY DAY 09/09/23   Kathleene Hazel, MD  Multiple Vitamin (MULTIVITAMIN WITH MINERALS) TABS tablet Take 1 tablet by mouth daily.    [provider]  nitroGLYCERIN (NITROSTAT) 0.4 MG SL tablet DISSOLVE 1 TABLET UNDER TONGUE EVERY 5 MINUTES UP TO 15 MIN FOR CHESTPAIN. IF NO RELIEF CALL 911. 07/13/22   Kathleene Hazel, MD  OneTouch UltraSoft 2 Lancets MISC 1 Lancet by Other route See admin instructions. Stick finger with lancet to check your blood sugar as desired, up to 3 times daily. 05/22/23   Tiffany Kocher, DO  pantoprazole (PROTONIX) 40 MG tablet TAKE 1 TABLET BY MOUTH ONCE DAILY AT 6:00 AM. 09/02/23   Kathleene Hazel, MD  potassium chloride SA (KLOR-CON M20) 20 MEQ tablet TAKE 1 TABLET BY MOUTH EVERY DAY 09/02/23   Kathleene Hazel, MD  ranolazine (RANEXA) 500 MG 12 hr tablet TAKE 1 TABLET BY MOUTH TWICE A DAY 09/18/23   Kathleene Hazel, MD  rosuvastatin (CRESTOR) 40 MG tablet TAKE 1 TABLET BY MOUTH EVERY DAY 09/18/23   Kathleene Hazel, MD  tetrahydrozoline-zinc (VISINE-AC) 0.05-0.25 % ophthalmic solution Place 2 drops into both eyes 3 (three) times daily as needed (itching/burning).    [provider]      Allergies    Sulfa antibiotics and Hydrocodone    Review of Systems  Review of Systems  Constitutional:  Negative for chills and fever.  SEE HPI  Physical Exam Updated Vital Signs BP 134/87 (BP Location: Right Arm)   Pulse 72   Temp 98.2 F (36.8 C) (Oral)   Resp 16   Ht 5\' 7"  (1.702 m)   Wt 89.8 kg   SpO2 96%   BMI 31.01 kg/m  Physical Exam Vitals and nursing note reviewed.  Constitutional:      General: He is not in acute distress.    Appearance: He is not ill-appearing or toxic-appearing.  Eyes:     General: No scleral icterus. Pulmonary:     Effort: Pulmonary effort is normal. No respiratory distress.  Musculoskeletal:     Comments: Silver colored ring present at the proximal portion of the left  ring finger. The ring si able to be turned. The patient has a very small lesion, likely a de-roofed blister on the dorsal aspect and the proximal end of the ringer. Small scratch to the proximal aspect of the ring. Compartments are soft throughout. Brisk cap refill. Some mild swelling present just distal to the bring. Coloration and temperature appear and feeel symmetric, however he does have slightly more erythema closer to the ring on the distal dorsal aspect. Sensation reported intact and symmetric per patient.    Skin:    General: Skin is warm and dry.  Neurological:     Mental Status: He is alert.     ED Results / Procedures / Treatments   Labs (all labs ordered are listed, but only abnormal results are displayed) Labs Reviewed - No data to display  EKG None  Radiology No results found.  Procedures .Foreign Body Removal  Date/Time: 02/05/2024 1:01 PM  Performed by: Achille Rich, PA-C Authorized by: Achille Rich, PA-C  Consent: Verbal consent obtained. Risks and benefits: risks, benefits and alternatives were discussed Consent given by: patient Patient understanding: patient states understanding of the procedure being performed Patient identity confirmed: verbally with patient Intake: Left ring finger.  Sedation: Patient sedated: no  Complexity: simple 1 objects recovered. Objects recovered: silver colored ring Post-procedure assessment: foreign body removed Patient tolerance: patient tolerated the procedure well with no immediate complications Comments: Procedure as well as risk and benefits were discussed with the patient.  Patient verbalized consent and his understanding.  He does appear to have an abrasion, likely the deroofed blister, and has a very small scratch to the more proximal finger.  Compartments are soft.  Brisk cap refill present to the finger.  Some mild erythema just proximal and distal to the ring.  Sensation reportedly intact distally and proximally.  The ring was removed using 1/8" ribbon and surgical lube. Patient reports significant improvement of symptoms upon removal.    Medications Ordered in ED Medications - No data to display  ED Course/ Medical Decision Making/ A&P   Medical Decision Making  68 y.o. male presents to the ER today for evaluation of ring stuck on finger. Differential diagnosis includes but is not limited to ring stuck, NV damage. Vital signs unremarkable. Physical exam as noted above.   Please see procedure note. Patient's tetanus was updated yesterday before the event. Will re-evaluate.   On re-evaluation, patient reports finger feels back to baseline. The patient does not have any circumferential burn. Compartments soft throughout the finger coloration returned to normal. Brisk cap refill. Sensation reportedly intact. Will recommend that the patient keep the area clean and keep the ring off until the finger heals. He is  stable for discharge home.   We discussed plan at bedside. We discussed strict return precautions and red flag symptoms. The patient verbalized their understanding and agrees to the plan. The patient is stable and being discharged home in good condition.  Portions of this report may have been transcribed using voice recognition software. Every effort was made to ensure accuracy; however, inadvertent computerized transcription errors may be present.   Final Clinical Impression(s) / ED Diagnoses Final diagnoses:  Tight ring on finger    Rx / DC Orders ED Discharge Orders     None         Achille Rich, PA-C 02/05/24 1523    Achille Rich, PA-C 02/05/24 1524    Jacalyn Lefevre, MD 02/05/24 1547

## 2024-02-05 NOTE — Discharge Instructions (Addendum)
 You were seen in the ER which did not of your ring.  I am not to be able to remove this for you.  Make sure that you are cleaning the area daily with Dial soap and water.  He can also apply a vascular Aquaphor to keep the area moisturized.  Please follow with your primary care doctor for reevaluation.  If you start to have any sensation, color changes or any temperature changes.  Worsening pain or swelling.  Please return to your nearest emergency department for reevaluation.  If you have any other concerns, new or worsening symptoms, please return to your nearest emergency department for reevaluation.  Contact a doctor if: Your burn does not get better. Your burn gets worse. You have any signs of infection. Your burn looks different or gets black or red spots. Your pain does not get better with medicine. You feel worried, nervous, or sad (depressed). Get help right away if: You have red streaks near the burn. You have very bad pain.

## 2024-02-05 NOTE — ED Notes (Signed)
 Patient discharged. Provider spoke to patient. Paperwork given to patient and reviewed. Pt verbalized understanding. VSS. A+Ox4. Patient ambulated out of the ER with steady, independent gait. No iv in place.

## 2024-02-25 DIAGNOSIS — E1159 Type 2 diabetes mellitus with other circulatory complications: Secondary | ICD-10-CM | POA: Diagnosis not present

## 2024-02-25 DIAGNOSIS — I110 Hypertensive heart disease with heart failure: Secondary | ICD-10-CM | POA: Diagnosis not present

## 2024-02-25 DIAGNOSIS — E1169 Type 2 diabetes mellitus with other specified complication: Secondary | ICD-10-CM | POA: Diagnosis not present

## 2024-02-25 DIAGNOSIS — Z008 Encounter for other general examination: Secondary | ICD-10-CM | POA: Diagnosis not present

## 2024-02-25 DIAGNOSIS — F324 Major depressive disorder, single episode, in partial remission: Secondary | ICD-10-CM | POA: Diagnosis not present

## 2024-02-25 DIAGNOSIS — Z959 Presence of cardiac and vascular implant and graft, unspecified: Secondary | ICD-10-CM | POA: Diagnosis not present

## 2024-02-25 DIAGNOSIS — E785 Hyperlipidemia, unspecified: Secondary | ICD-10-CM | POA: Diagnosis not present

## 2024-02-25 DIAGNOSIS — I509 Heart failure, unspecified: Secondary | ICD-10-CM | POA: Diagnosis not present

## 2024-02-25 DIAGNOSIS — Z6830 Body mass index (BMI) 30.0-30.9, adult: Secondary | ICD-10-CM | POA: Diagnosis not present

## 2024-02-25 DIAGNOSIS — R2681 Unsteadiness on feet: Secondary | ICD-10-CM | POA: Diagnosis not present

## 2024-02-25 DIAGNOSIS — E669 Obesity, unspecified: Secondary | ICD-10-CM | POA: Diagnosis not present

## 2024-02-25 DIAGNOSIS — I25119 Atherosclerotic heart disease of native coronary artery with unspecified angina pectoris: Secondary | ICD-10-CM | POA: Diagnosis not present

## 2024-02-25 DIAGNOSIS — K219 Gastro-esophageal reflux disease without esophagitis: Secondary | ICD-10-CM | POA: Diagnosis not present

## 2024-02-26 ENCOUNTER — Ambulatory Visit: Admitting: Student

## 2024-02-26 VITALS — BP 129/60 | HR 67 | Ht 67.0 in | Wt 192.0 lb

## 2024-02-26 DIAGNOSIS — E119 Type 2 diabetes mellitus without complications: Secondary | ICD-10-CM

## 2024-02-26 DIAGNOSIS — R0609 Other forms of dyspnea: Secondary | ICD-10-CM | POA: Diagnosis not present

## 2024-02-26 DIAGNOSIS — Z7985 Long-term (current) use of injectable non-insulin antidiabetic drugs: Secondary | ICD-10-CM | POA: Diagnosis not present

## 2024-02-26 LAB — POCT GLYCOSYLATED HEMOGLOBIN (HGB A1C): HbA1c, POC (controlled diabetic range): 10.8 % — AB (ref 0.0–7.0)

## 2024-02-26 MED ORDER — SEMAGLUTIDE(0.25 OR 0.5MG/DOS) 2 MG/3ML ~~LOC~~ SOPN: 0.2500 mg | PEN_INJECTOR | SUBCUTANEOUS | 0 refills | Status: DC

## 2024-02-26 NOTE — Assessment & Plan Note (Addendum)
 A1c 10.8.  Patient feeling more fatigued.  Questionable compliance with metformin 500 mg daily.  Discussed all options including insulin.  Using shared decision making, will start GLP-1 with follow-up in 4 weeks. - Ozempic 0.25 mg weekly, follow-up in 4 weeks - Consider increasing metformin - Repeat A1c in 3 months - Counseled on eye exam - Obtain UACR today

## 2024-02-26 NOTE — Progress Notes (Signed)
    SUBJECTIVE:   CHIEF COMPLAINT / HPI:   Diabetes  Exertional dyspnea Patient presents to check A1c.  Reports he thinks he takes metformin daily, but is not sure.  His wife handles his medications (not present today), he did not bring medications today. Overall he feels more tired and fatigues during exercise.  Additionally is having worsening exertional dyspnea and exercise intolerance, does have significant cardiac history.  No chest pain, shortness of breath, dizziness, syncope, cough, fevers.  OBJECTIVE:   BP 129/60   Pulse 67   Ht 5\' 7"  (1.702 m)   Wt 192 lb (87.1 kg)   SpO2 95%   BMI 30.07 kg/m    General: NAD, pleasant Cardio: RRR, no MRG. Cap Refill <2s. Respiratory: CTAB, normal wob on RA Skin: Warm and dry  ASSESSMENT/PLAN:   Assessment & Plan Type 2 diabetes mellitus without complication, without long-term current use of insulin (HCC) A1c 10.8.  Patient feeling more fatigued.  Questionable compliance with metformin 500 mg daily.  Discussed all options including insulin.  Using shared decision making, will start GLP-1 with follow-up in 4 weeks. - Ozempic 0.25 mg weekly, follow-up in 4 weeks - Consider increasing metformin - Repeat A1c in 3 months - Counseled on eye exam - Obtain UACR today Exertional dyspnea Worsening exertional dyspnea.  Possibly related to patient's uncontrolled diabetes.  Benign physical exam today, no acute concerns today.  However does have extensive cardiac history with bypass. Questionable compliance with his medications.  Hemodynamically stable in office today. - Recommend follow-up with cardiologist, consider stress test  Health maintenance Patient agreeable to care annual wellness visit. Will message staff to schedule.  Tiffany Kocher, DO [facility]

## 2024-02-26 NOTE — Patient Instructions (Addendum)
 It was great to see you! Thank you for allowing me to participate in your care!   I recommend that you always bring your medications to each appointment as this makes it easy to ensure we are on the correct medications and helps [place] not miss when refills are needed.  Our plans for today:  -  Use 0.25 mg of Ozempic once weekly. If you have any questions on how to use the pen, please ask the pharmacist when you pick up the medication. - MAKE APPOINTMENT TO BE SEEN BY DR. Claudean Severance in 4 weeks - Please call your cardiologist and schedule an appointment to be seen for your increased shortness of breath with exertion. - Please schedule an EYE exam - You will receive a call for medicare annual wellness visit - We are checking some labs today, I will call you if they are abnormal will send you a MyChart message or a letter if they are normal.  If you do not hear about your labs in the next 2 weeks please let us know.  Take care and seek immediate care sooner if you develop any concerns. Please remember to show up 15 minutes before your scheduled appointment time!  Tiffany Kocher, DO [facility]

## 2024-02-27 LAB — BASIC METABOLIC PANEL WITH GFR
BUN/Creatinine Ratio: 12 (ref 10–24)
BUN: 14 mg/dL (ref 8–27)
CO2: 25 mmol/L (ref 20–29)
Calcium: 9.5 mg/dL (ref 8.6–10.2)
Chloride: 100 mmol/L (ref 96–106)
Creatinine, Ser: 1.17 mg/dL (ref 0.76–1.27)
Glucose: 235 mg/dL — ABNORMAL HIGH (ref 70–99)
Potassium: 4.8 mmol/L (ref 3.5–5.2)
Sodium: 140 mmol/L (ref 134–144)
eGFR: 68 mL/min/{1.73_m2} (ref >59)

## 2024-02-27 LAB — MICROALBUMIN / CREATININE URINE RATIO
Creatinine, Urine: 79.1 mg/dL
Microalb/Creat Ratio: 28 mg/g{creat} (ref 0–29)
Microalbumin, Urine: 22.5 ug/mL

## 2024-02-28 ENCOUNTER — Encounter: Payer: Self-pay | Admitting: Student

## 2024-02-28 ENCOUNTER — Telehealth: Payer: Self-pay

## 2024-02-28 NOTE — Telephone Encounter (Signed)
 Patient calls nurse line regarding questions about metformin.   Patient states that he was able to pick up Ozempic and inject yesterday.   He is asking if he is still supposed to be taking Metformin and what dosage.   Will forward to PCP for clarification.   Veronda Prude, RN

## 2024-02-28 NOTE — Telephone Encounter (Signed)
 I have attempted without success to contact this patient by phone  I left a message on answering machine. Aquilla Solian, CMA

## 2024-02-28 NOTE — Telephone Encounter (Signed)
 Attempted to call patient. He did not answer, LVM asking that patient return call.   Veronda Prude, RN

## 2024-03-08 NOTE — Progress Notes (Signed)
 Chief Complaint  Patient presents with   Follow-up    CAD   History of Present Illness: 68 yo male with a history of CAD s/p CABG 2004 with subsequent PCI, HLD, HTN and mitral regurgitation who is here today for cardiac follow up. He underwent 5V CABG in 2004. 2 drug eluting stents were placed in the SVG to to PDA in July 2013. At that time the LIMA to LAD was patent but the vein grafts to the OM, PLA and Diagonal were occluded. Repeat cath in December 2014 with recurrent severe disease in the SVG to PDA treated with 2 drug eluting stents. Echo August 2015 with normal LV function, mild MR. He was seen here in July 2016 with recurrent chest pain. Cardiac cath July 2016 with new occlusion of SVG to PDA. The only patent graft was the LIMA to LAD. Diffuse disease in native vessels. No targets for PCI. Ranexa  was started. Normal ABI June 2018. Echo September 2023 with LVEF=55-60%, mild MR. Evidence of prior CVA on brain MRI in June 2023.   He is here today for follow up. The patient denies any chest pain, dyspnea, palpitations, lower extremity edema, orthopnea, PND. Rare dizziness but no near syncope or syncope.    Primary Care Physician: [facility], [place], [place])   Past Medical History:  Diagnosis Date   Anxiety    Arthritis    hands, shoulders, arms   CAD (coronary artery disease)    5V CABG in 2004; stent to RCA x1 and SVG to acute marginal x2 2011;  PTCA/DES x 2 body of SVG to PDA  05/20/12   Chronic heart failure with preserved ejection fraction (HFpEF) (HCC)    Chronic lower back pain    "w/activity"   Depression    GERD (gastroesophageal reflux disease)    HLD (hyperlipidemia)    Hypertension    Mitral regurgitation    Moderate by TEE 03/27/12   Myocardial infarction (HCC) 11/19/2002   Numbness and tingling in hands    Numbness and tingling of both legs    Triquetral chip fracture, right, closed, initial encounter 03/01/2017   Umbilical hernia     unrepaired (05/20/12)    Past Surgical History:  Procedure Laterality Date   CARDIAC CATHETERIZATION N/A 05/27/2015   Procedure: Left Heart Cath and Coronary Angiography;  Surgeon: Odie Benne, MD;  Location: [facility] INVASIVE CV LAB;  Service: Cardiovascular;  Laterality: N/A;   CORONARY ANGIOPLASTY WITH STENT PLACEMENT  05/11   stent to RCA x 1 and SVG-acute marginal x2. EF normal   CORONARY ANGIOPLASTY WITH STENT PLACEMENT  05/20/12   PTCA/DES x 2 body of SVG to PDA    CORONARY ARTERY BYPASS GRAFT  2004   5v CABG    INCISION AND DRAINAGE OF WOUND  2010   "from bite; maybe snake or spider; almost lost LLE"   LEFT HEART CATHETERIZATION WITH CORONARY/GRAFT ANGIOGRAM N/A 05/20/2012   Procedure: LEFT HEART CATHETERIZATION WITH Estella Helling;  Surgeon: Odie Benne, MD;  Location[facility] CATH LAB;  Service: Cardiovascular;  Laterality: N/A;   LEFT HEART CATHETERIZATION WITH CORONARY/GRAFT ANGIOGRAM N/A 10/30/2013   Procedure: LEFT HEART CATHETERIZATION WITH Estella Helling;  Surgeon: Odie Benne, MD;  Location: [facility] CATH LAB;  Service: Cardiovascular;  Laterality: N/A;   PERCUTANEOUS CORONARY STENT INTERVENTION (PCI-S)  10/30/2013   Procedure: PERCUTANEOUS CORONARY STENT INTERVENTION (PCI-S);  Surgeon: Odie Benne, MD;  Location: [facility] CATH LAB;  Service: Cardiovascular;;  PTCA  10/30/2013   DES  SVG     TEE WITHOUT CARDIOVERSION  03/27/2012   Procedure: TRANSESOPHAGEAL ECHOCARDIOGRAM (TEE);  Surgeon: Darlis Eisenmenger, MD;  Location: [facility] ENDOSCOPY;  Service: Cardiovascular;  Laterality: N/A;  to be done at 1100    Current Outpatient Medications  Medication Sig Dispense Refill   aspirin  EC 81 MG tablet Take 81 mg by mouth daily.     Blood Glucose Monitoring Suppl (ONETOUCH VERIO FLEX SYSTEM) w/Device KIT 1 kit by Does not apply route daily as needed. 1 kit 0   citalopram  (CELEXA ) 40 MG tablet TAKE 1 TABLET BY MOUTH EVERY DAY 90 tablet 1   clopidogrel   (PLAVIX ) 75 MG tablet TAKE 1 TABLET BY MOUTH EVERY DAY 90 tablet 3   furosemide  (LASIX ) 20 MG tablet TAKE 1 TABLET BY MOUTH EVERY DAY 90 tablet 3   gabapentin  (NEURONTIN ) 100 MG capsule Take 1 capsule by mouth daily.     metFORMIN  (GLUCOPHAGE -XR) 500 MG 24 hr tablet Take 1 tablet (500 mg total) by mouth daily with breakfast. 90 tablet 0   metoprolol  succinate (TOPROL -XL) 25 MG 24 hr tablet TAKE 1 TABLET BY MOUTH EVERY DAY 90 tablet 2   Multiple Vitamin (MULTIVITAMIN WITH MINERALS) TABS tablet Take 1 tablet by mouth daily.     nitroGLYCERIN  (NITROSTAT ) 0.4 MG SL tablet DISSOLVE 1 TABLET UNDER TONGUE EVERY 5 MINUTES UP TO 15 MIN FOR CHESTPAIN. IF NO RELIEF CALL 911. 25 tablet 6   OneTouch UltraSoft 2 Lancets MISC 1 Lancet by Other route See admin instructions. Stick finger with lancet to check your blood sugar as desired, up to 3 times daily. 100 each 12   pantoprazole  (PROTONIX ) 40 MG tablet TAKE 1 TABLET BY MOUTH ONCE DAILY AT 6:00 AM. 90 tablet 3   potassium chloride  SA (KLOR-CON  M20) 20 MEQ tablet TAKE 1 TABLET BY MOUTH EVERY DAY 90 tablet 3   ranolazine  (RANEXA ) 500 MG 12 hr tablet TAKE 1 TABLET BY MOUTH TWICE A DAY 180 tablet 2   rosuvastatin  (CRESTOR ) 40 MG tablet TAKE 1 TABLET BY MOUTH EVERY DAY 90 tablet 2   Semaglutide ,0.25 or 0.5MG /DOS, 2 MG/3ML SOPN Inject 0.25 mg into the skin once a week for 28 days. 3 mL 0   tetrahydrozoline-zinc (VISINE-AC) 0.05-0.25 % ophthalmic solution Place 2 drops into both eyes 3 (three) times daily as needed (itching/burning).     ezetimibe  (ZETIA ) 10 MG tablet Take 1 tablet (10 mg total) by mouth daily. 90 tablet 3   No current facility-administered medications for this visit.    Allergies  Allergen Reactions   Sulfa Antibiotics Rash   Hydrocodone  Hives and Itching    Social History   Socioeconomic History   Marital status: Married    Spouse name: Not on file   Number of children: 3   Years of education: Not on file   Highest education level: Not  on file  Occupational History   Occupation: Unemployed HVAC  Tobacco Use   Smoking status: Former    Current packs/day: 0.00    Average packs/day: 3.0 packs/day for 40.0 years (120.0 ttl pk-yrs)    Types: Cigarettes    Start date: 01/23/1963    Quit date: 01/23/2003    Years since quitting: 21.1   Smokeless tobacco: Never  Vaping Use   Vaping status: Never Used  Substance and Sexual Activity   Alcohol use: No    Comment: 05/20/12 "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 type   Diabetes Father    Hyperlipidemia Father    Hypertension Father    Stomach cancer Father    Heart disease Brother 1       X 5 brothers    Review of Systems:  As stated in the HPI and otherwise negative.   BP 118/76   Pulse 76   Ht 5\' 7"  (1.702 m)   Wt 86.2 kg   SpO2 96%   BMI 29.76 kg/m   Physical Examination:  General: Well developed, well nourished, NAD  HEENT: OP clear, mucus membranes moist  SKIN: warm, dry. No rashes. Neuro: No focal deficits  Musculoskeletal: Muscle strength 5/5 all ext  Psychiatric: Mood and affect normal  Neck: No JVD, no carotid bruits, no thyromegaly, no lymphadenopathy.  Lungs:Clear bilaterally, no wheezes, rhonci, crackles Cardiovascular: Regular rate and rhythm. No murmurs, gallops or rubs. Abdomen:Soft. Bowel sounds present. Non-tender.  Extremities: No lower extremity edema. Pulses are 2 + in the bilateral DP/PT.  EKG:  EKG is ordered today. The ekg ordered  today demonstrates   Recent Labs: 05/01/2023: ALT 23; Hemoglobin 15.2; Platelets 183 02/26/2024: BUN 14; Creatinine, Ser 1.17; Potassium 4.8; Sodium 140   Lipid Panel    Component Value Date/Time   CHOL 141 05/01/2023 1021   TRIG 199 (H) 05/01/2023 1021   HDL 48 05/01/2023 1021   CHOLHDL 2.9 05/01/2023 1021   CHOLHDL 3.9 02/22/2016 0844   VLDL 40 (H) 02/22/2016 0844   LDLCALC 60 05/01/2023 1021     Wt Readings from Last 3 Encounters:  03/09/24 86.2 kg  02/26/24 87.1 kg  02/05/24 89.8 kg    Assessment and Plan:   1. CAD with stable angina: No change in chronic chest pain. Continue ASA, Plavix , statin, beta blocker and Ranexa .        2. HTN: BP is well controlled. Continue current therapy  3. Hyperlipidemia: LDL 60 in June 2024. Continue statin     4. Mitral regurgitation: Mild by echo in 2023.   5. Chronic diastolic CHF: No volume overload. Wt stable. Continue Lasix    Labs/ tests ordered today include:  No orders of the defined types were placed in this encounter.  Disposition:   F/U with me in 12 months  Signed, Antoinette Batman, MD 03/09/2024 9:19 AM    [facility] [address] [place], [place], [place]  [address] Phone: [phone]; Fax: [phone]

## 2024-03-09 ENCOUNTER — Ambulatory Visit: Attending: Cardiovascular Disease | Admitting: Cardiovascular Disease

## 2024-03-09 ENCOUNTER — Encounter: Payer: Self-pay | Admitting: Cardiovascular Disease

## 2024-03-09 VITALS — BP 118/76 | HR 76 | Ht 67.0 in | Wt 190.0 lb

## 2024-03-09 DIAGNOSIS — I5032 Chronic diastolic (congestive) heart failure: Secondary | ICD-10-CM

## 2024-03-09 DIAGNOSIS — I25118 Atherosclerotic heart disease of native coronary artery with other forms of angina pectoris: Secondary | ICD-10-CM | POA: Diagnosis not present

## 2024-03-09 DIAGNOSIS — I340 Nonrheumatic mitral (valve) insufficiency: Secondary | ICD-10-CM | POA: Diagnosis not present

## 2024-03-09 DIAGNOSIS — I10 Essential (primary) hypertension: Secondary | ICD-10-CM

## 2024-03-09 DIAGNOSIS — E785 Hyperlipidemia, unspecified: Secondary | ICD-10-CM

## 2024-03-09 NOTE — Patient Instructions (Signed)
 Medication Instructions:  Your physician recommends that you continue on your current medications as directed. Please refer to the Current Medication list given to you today.  *If you need a refill on your cardiac medications before your next appointment, please call your pharmacy*  Lab Work: If you have labs (blood work) drawn today and your tests are completely normal, you will receive your results only by: MyChart Message (if you have MyChart) OR A paper copy in the mail If you have any lab test that is abnormal or we need to change your treatment, we will call you to review the results.  Testing/Procedures: None ordered today.  Follow-Up: At [facility], you and your health needs are our priority.  As part of our continuing mission to provide you with exceptional heart care, our providers are all part of one team.  This team includes your primary Cardiologist (physician) and Advanced Practice Providers or APPs (Physician Assistants and Nurse Practitioners) who all work together to provide you with the care you need, when you need it.  Your next appointment:   1 year(s)  Provider:   Antoinette Batman, MD     We recommend signing up for the patient portal called "MyChart".  Sign up information is provided on this After Visit Summary.  MyChart is used to connect with patients for Virtual Visits (Telemedicine).  Patients are able to view lab/test results, encounter notes, upcoming appointments, etc.  Non-urgent messages can be sent to your provider as well.   To learn more about what you can do with MyChart, go to [email or web address].   Other Instructions       1st Floor: - Lobby - Registration  - Pharmacy  - Lab - Cafe  2nd Floor: - PV Lab - Diagnostic Testing (echo, CT, nuclear med)  3rd Floor: - Vacant  4th Floor: - TCTS (cardiothoracic surgery) - AFib Clinic - Structural Heart Clinic - Vascular Surgery  - Vascular Ultrasound  5th Floor: -  HeartCare Cardiology (general and EP) - Clinical Pharmacy for coumadin, hypertension, lipid, weight-loss medications, and med management appointments    Valet parking services will be available as well.

## 2024-03-18 ENCOUNTER — Other Ambulatory Visit: Payer: Self-pay

## 2024-03-18 DIAGNOSIS — E119 Type 2 diabetes mellitus without complications: Secondary | ICD-10-CM

## 2024-03-18 MED ORDER — SEMAGLUTIDE(0.25 OR 0.5MG/DOS) 2 MG/3ML ~~LOC~~ SOPN: 0.2500 mg | PEN_INJECTOR | SUBCUTANEOUS | 0 refills | Status: DC

## 2024-03-26 ENCOUNTER — Ambulatory Visit (INDEPENDENT_AMBULATORY_CARE_PROVIDER_SITE_OTHER): Admitting: Student

## 2024-03-26 ENCOUNTER — Encounter: Payer: Self-pay | Admitting: Student

## 2024-03-26 VITALS — BP 112/77 | HR 72 | Ht 67.0 in | Wt 189.1 lb

## 2024-03-26 DIAGNOSIS — R110 Nausea: Secondary | ICD-10-CM | POA: Diagnosis not present

## 2024-03-26 DIAGNOSIS — E119 Type 2 diabetes mellitus without complications: Secondary | ICD-10-CM | POA: Diagnosis not present

## 2024-03-26 MED ORDER — SEMAGLUTIDE (1 MG/DOSE) 4 MG/3ML ~~LOC~~ SOPN: 1.0000 mg | PEN_INJECTOR | SUBCUTANEOUS | 0 refills | Status: DC

## 2024-03-26 MED ORDER — SEMAGLUTIDE(0.25 OR 0.5MG/DOS) 2 MG/3ML ~~LOC~~ SOPN: 0.5000 mg | PEN_INJECTOR | SUBCUTANEOUS | 0 refills | Status: AC

## 2024-03-26 MED ORDER — ONDANSETRON HCL 4 MG PO TABS: 4.0000 mg | ORAL_TABLET | Freq: Three times a day (TID) | ORAL | 0 refills | Status: AC | PRN

## 2024-03-26 NOTE — Progress Notes (Signed)
    SUBJECTIVE:   CHIEF COMPLAINT / HPI:   Diabetes Seen on 02/26/2024 and prescribed Ozempic  after found to have uncontrolled A1c of 10.8.  He is still taking metformin  500 mg daily, and now using GLP-1 which he started on 02/27/2024. He reports good compliance, but does have nausea for several days after the injection. UACR was obtained last visit, which was WNL.  Appt with optometry for dilated eye exam next month.  OBJECTIVE:   BP 112/77   Pulse 72   Ht 5\' 7"  (1.702 m)   Wt 189 lb 2 oz (85.8 kg)   SpO2 99%   BMI 29.62 kg/m    General: NAD, pleasant Cardio: RRR, no MRG. Cap Refill <2s. Respiratory: CTAB, normal wob on RA Skin: Warm and dry  ASSESSMENT/PLAN:   Assessment & Plan Type 2 diabetes mellitus without complication, without long-term current use of insulin (HCC) Compliant with Ozempic , mild side effect of nausea.  Compliant with metformin  5 mg daily.  Fatigue is slightly improved, and CBGs at home have been less than 200 (this is improved from prior visit). - Increase Ozempic  to 0.5 mg weekly for 4 weeks, then titrate to 1 mg weekly for 4 weeks - Needs eye exam - Zofran  as needed for nausea - Follow-up in 8 weeks, recheck A1c then   Lavada Porteous, DO [facility]

## 2024-03-26 NOTE — Patient Instructions (Signed)
 It was great to see you! Thank you for allowing me to participate in your care!   I recommend that you always bring your medications to each appointment as this makes it easy to ensure we are on the correct medications and helps us  not miss when refills are needed.  Our plans for today:  - Take Ozempic  0.5 mg weekly for the next 4 weeks, starting today 03/26/2024 -Then take 1.0 mg weekly starting on 04/23/2024 - Follow-up with Dr.Dillie Burandt in 8 weeks for A1C recheck - You can take Zofran  as needed for nausea   Take care and seek immediate care sooner if you develop any concerns. Please remember to show up 15 minutes before your scheduled appointment time!  Lavada Porteous, DO [facility]

## 2024-03-26 NOTE — Assessment & Plan Note (Signed)
 Compliant with Ozempic , mild side effect of nausea.  Compliant with metformin  5 mg daily.  Fatigue is slightly improved, and CBGs at home have been less than 200 (this is improved from prior visit). - Increase Ozempic  to 0.5 mg weekly for 4 weeks, then titrate to 1 mg weekly for 4 weeks - Needs eye exam - Zofran  as needed for nausea - Follow-up in 8 weeks, recheck A1c then

## 2024-04-10 ENCOUNTER — Other Ambulatory Visit: Payer: Self-pay

## 2024-04-10 MED ORDER — METFORMIN HCL ER 500 MG PO TB24: 500.0000 mg | ORAL_TABLET | Freq: Every day | ORAL | 1 refills | Status: DC

## 2024-04-20 ENCOUNTER — Other Ambulatory Visit: Payer: Self-pay | Admitting: Student

## 2024-04-20 DIAGNOSIS — E119 Type 2 diabetes mellitus without complications: Secondary | ICD-10-CM

## 2024-04-23 ENCOUNTER — Telehealth: Payer: Self-pay

## 2024-04-23 NOTE — Telephone Encounter (Signed)
 Patient LVM on nurse line regarding concerns with a message he received from the pharmacy about his Ozempic  prescription.   Called the pharmacy. They report that patient was able to pick up medication today. Message was sent about future refill. There is nothing that patient or our office needs to do at this time.   Attempted to call patient back to provide update. Patient did not answer, LVM advising patient of update and to call back with any other concerns.   Elsie Halo, RN

## 2024-05-08 ENCOUNTER — Telehealth: Payer: Self-pay

## 2024-05-08 ENCOUNTER — Other Ambulatory Visit (HOSPITAL_COMMUNITY): Payer: Self-pay

## 2024-05-08 MED ORDER — CONTOUR BLOOD GLUCOSE SYSTEM W/DEVICE KIT: 1.0000 | PACK | Freq: Once | 0 refills | Status: AC

## 2024-05-08 MED ORDER — GLUCOSE BLOOD VI STRP: ORAL_STRIP | 12 refills | Status: AC

## 2024-05-08 NOTE — Telephone Encounter (Signed)
 Received phone call from patient's mother regarding issues with glucometer being broken.   Mother is requesting new meter be sent to pharmacy. She would prefer if it were a different meter than the onetouch verio.   Advised that I would reach out to pharmacy team to determine coverage with [facility].   Patient is testing BID.   Elsie Halo, RN

## 2024-05-08 NOTE — Telephone Encounter (Signed)
 Patient contacted for follow-up of need/request for new glucometer.  States he used meter for a time then it stopped working.  We discussed battery exchange but patient did not have a replacement.  A new meter brand is likely the one covered by current insurance - [employer].   Pharmacy contacted by phone.  Shows meter was last filled early July 2024 - so insurance should cover new meter.   New meter - [employer] and strips sent to pharmacy.  Also refilled current lancets.   Total time with patient call and documentation of interaction: 16 minutes.  F/U Phone call planned: None

## 2024-05-11 ENCOUNTER — Telehealth: Payer: Self-pay

## 2024-05-11 NOTE — Telephone Encounter (Signed)
 Mother calls nurse line in regards to DM supplies.   She reports the pharmacy received the testing strips, however denied receiving the meter.   I called [facility] for more information, however with their new system I had to LVM.   Will await their return call.

## 2024-05-11 NOTE — Telephone Encounter (Signed)
 Reviewed and agree with Dr Macky Lower plan.

## 2024-05-14 ENCOUNTER — Telehealth: Payer: Self-pay

## 2024-05-14 DIAGNOSIS — E119 Type 2 diabetes mellitus without complications: Secondary | ICD-10-CM

## 2024-05-14 NOTE — Telephone Encounter (Signed)
 Patient calls nurse line in regards to Ozempic .   He reports he just finished his 1mg  pen. He reports he has been on this for ~ 1 month.   He denies any undesired side effects. He denies any constipation, nausea, vomiting or diarrhea.   He reports he is comfortable going to the next dose, however will leave this up to PCP.   Advised will forward to PCP.

## 2024-05-15 MED ORDER — OZEMPIC (1 MG/DOSE) 4 MG/3ML ~~LOC~~ SOPN
1.0000 mg | PEN_INJECTOR | SUBCUTANEOUS | 0 refills | Status: DC
Start: 2024-05-15 — End: 2024-06-16

## 2024-05-15 NOTE — Telephone Encounter (Signed)
Attempted to reach patient. No answer. LVM of note left by provider. Aquilla Solian, CMA

## 2024-05-18 NOTE — Telephone Encounter (Signed)
 Called patient after patients mother LVM.   Discussed PCP recommendations.   Patient scheduled for 7/18 with PCP.  Continue 1mg  for now.   All questions answered.

## 2024-05-31 ENCOUNTER — Other Ambulatory Visit: Payer: Self-pay | Admitting: Cardiology

## 2024-05-31 ENCOUNTER — Other Ambulatory Visit: Payer: Self-pay | Admitting: Student

## 2024-06-02 ENCOUNTER — Other Ambulatory Visit: Payer: Self-pay

## 2024-06-02 MED ORDER — METOPROLOL SUCCINATE ER 25 MG PO TB24: ORAL_TABLET | ORAL | 2 refills | Status: AC

## 2024-06-02 MED ORDER — ROSUVASTATIN CALCIUM 40 MG PO TABS: 40.0000 mg | ORAL_TABLET | Freq: Every day | ORAL | 2 refills | Status: AC

## 2024-06-02 MED ORDER — RANOLAZINE ER 500 MG PO TB12: 500.0000 mg | ORAL_TABLET | Freq: Two times a day (BID) | ORAL | 2 refills | Status: AC

## 2024-06-05 ENCOUNTER — Ambulatory Visit: Admitting: Student

## 2024-06-05 ENCOUNTER — Encounter: Payer: Self-pay | Admitting: Student

## 2024-06-05 VITALS — BP 117/76 | HR 70 | Ht 67.0 in | Wt 179.0 lb

## 2024-06-05 DIAGNOSIS — Z1211 Encounter for screening for malignant neoplasm of colon: Secondary | ICD-10-CM

## 2024-06-05 DIAGNOSIS — E119 Type 2 diabetes mellitus without complications: Secondary | ICD-10-CM

## 2024-06-05 LAB — POCT GLYCOSYLATED HEMOGLOBIN (HGB A1C): HbA1c, POC (controlled diabetic range): 6.5 % (ref 0.0–7.0)

## 2024-06-05 NOTE — Patient Instructions (Signed)
 It was great to see you! Thank you for allowing me to participate in your care!   I recommend that you always bring your medications to each appointment as this makes it easy to ensure we are on the correct medications and helps us  not miss when refills are needed.  Our plans for today:  - your A1c is 6.5, this is good news! - Keep taking your 1 mg Ozempic  weekly and 500 mg metformin  daily - Follow-up in 3 months for A1c check - You do not have to check your blood sugar every day. Please check when you feel ill or unwell. If it is low <70 please drink juice and recheck in 15 min- if it stays low go to emergency room. If it is >300 and you feel very ill (stomach pain, vomiting, confusion) go to emergency room.  Take care and seek immediate care sooner if you develop any concerns. Please remember to show up 15 minutes before your scheduled appointment time!  Gladis Church, DO [facility]

## 2024-06-05 NOTE — Progress Notes (Signed)
    SUBJECTIVE:   CHIEF COMPLAINT / HPI:   T2DM A1c of 10.8 three months ago, was only taking 500 mg metformin  daily.  We had started Ozempic  approximately 3 months ago, titrated his dose to 1 mg.  He has been tolerating his medication without side effects.  A1c today of 6.5. He had his eye exam, he reports they will send the records to our office-they found no concerns.  Health Maintenance  Needs repeat Cologuard in August 2025.   OBJECTIVE:   BP 117/76   Pulse 70   Ht 5' 7 (1.702 m)   Wt 179 lb (81.2 kg)   SpO2 96%   BMI 28.04 kg/m    General: NAD, pleasant Cardio: RRR, no MRG. Cap Refill <2s. Respiratory: CTAB, normal wob on RA GI: Abdomen is soft, not tender, not distended. BS present Skin: Warm and dry  ASSESSMENT/PLAN:   Assessment & Plan Type 2 diabetes mellitus without complication, without long-term current use of insulin (HCC) Controlled with Ozempic  1 mg. - Continue Ozempic  1 mg weekly - Continue metformin  500 mg daily, consider discontinuation after 3 months if A1c remains improved to reduce pill burden - Follow-up 3 months Colon cancer screening - Colo guard preference - Due August 25, ordered now, patient will return sample in August   Gladis Church, DO [facility]

## 2024-06-05 NOTE — Assessment & Plan Note (Signed)
 Controlled with Ozempic  1 mg. - Continue Ozempic  1 mg weekly - Continue metformin  500 mg daily, consider discontinuation after 3 months if A1c remains improved to reduce pill burden - Follow-up 3 months

## 2024-06-08 ENCOUNTER — Other Ambulatory Visit: Payer: Self-pay

## 2024-06-13 ENCOUNTER — Other Ambulatory Visit: Payer: Self-pay | Admitting: Student

## 2024-06-13 DIAGNOSIS — E119 Type 2 diabetes mellitus without complications: Secondary | ICD-10-CM

## 2024-06-15 ENCOUNTER — Other Ambulatory Visit: Payer: Self-pay | Admitting: Student

## 2024-06-15 DIAGNOSIS — E119 Type 2 diabetes mellitus without complications: Secondary | ICD-10-CM

## 2024-08-07 ENCOUNTER — Other Ambulatory Visit: Payer: Self-pay | Admitting: Student

## 2024-08-07 DIAGNOSIS — E119 Type 2 diabetes mellitus without complications: Secondary | ICD-10-CM

## 2024-08-28 ENCOUNTER — Other Ambulatory Visit: Payer: Self-pay

## 2024-08-28 MED ORDER — FUROSEMIDE 20 MG PO TABS: 20.0000 mg | ORAL_TABLET | Freq: Every day | ORAL | 2 refills | Status: AC

## 2024-08-28 MED ORDER — POTASSIUM CHLORIDE CRYS ER 20 MEQ PO TBCR: EXTENDED_RELEASE_TABLET | ORAL | 2 refills | Status: AC

## 2024-08-28 MED ORDER — PANTOPRAZOLE SODIUM 40 MG PO TBEC: DELAYED_RELEASE_TABLET | ORAL | 2 refills | Status: AC

## 2024-09-01 ENCOUNTER — Telehealth: Payer: Self-pay | Admitting: Family Medicine

## 2024-09-01 NOTE — Telephone Encounter (Signed)
 Received notice that patient is on citalopram  40 mg daily though recommended dose for >60 is 20 mg daily. Would recommend follow up with PCP to discuss risks and benefits. Can we get this scheduled, admin team? Thank you!

## 2024-09-02 ENCOUNTER — Other Ambulatory Visit: Payer: Self-pay

## 2024-09-02 ENCOUNTER — Telehealth: Payer: Self-pay | Admitting: Student

## 2024-09-02 NOTE — Telephone Encounter (Signed)
 LVM for patient to schedule appointment with PCP to discuss medication.

## 2024-09-03 MED ORDER — CLOPIDOGREL BISULFATE 75 MG PO TABS: 75.0000 mg | ORAL_TABLET | Freq: Every day | ORAL | 3 refills | Status: AC

## 2024-09-08 ENCOUNTER — Other Ambulatory Visit: Payer: Self-pay

## 2024-09-10 ENCOUNTER — Ambulatory Visit: Payer: Self-pay | Admitting: Student

## 2024-09-10 ENCOUNTER — Ambulatory Visit (INDEPENDENT_AMBULATORY_CARE_PROVIDER_SITE_OTHER): Admitting: Student

## 2024-09-10 VITALS — BP 105/66 | HR 67 | Wt 174.0 lb

## 2024-09-10 DIAGNOSIS — F32A Depression, unspecified: Secondary | ICD-10-CM

## 2024-09-10 DIAGNOSIS — Z1211 Encounter for screening for malignant neoplasm of colon: Secondary | ICD-10-CM | POA: Diagnosis not present

## 2024-09-10 DIAGNOSIS — E119 Type 2 diabetes mellitus without complications: Secondary | ICD-10-CM | POA: Diagnosis not present

## 2024-09-10 DIAGNOSIS — E785 Hyperlipidemia, unspecified: Secondary | ICD-10-CM

## 2024-09-10 DIAGNOSIS — R200 Anesthesia of skin: Secondary | ICD-10-CM | POA: Diagnosis not present

## 2024-09-10 LAB — POCT GLYCOSYLATED HEMOGLOBIN (HGB A1C): HbA1c, POC (controlled diabetic range): 6 % (ref 0.0–7.0)

## 2024-09-10 NOTE — Assessment & Plan Note (Addendum)
 Using shared decision-making, patient will not decrease dose at this time. - Continue citalopram  40 mg daily, if side effects arise consider decreasing to 20 mg daily.

## 2024-09-10 NOTE — Assessment & Plan Note (Signed)
-  Check lipid panel today - Continue rosuvastatin /Zetia

## 2024-09-10 NOTE — Progress Notes (Signed)
    SUBJECTIVE:   CHIEF COMPLAINT / HPI:   Discussed the use of AI scribe software for clinical note transcription with the patient, who gave verbal consent to proceed.  History of Present Illness Frank [name] is a 68 year old male who presents for a diabetes follow-up and medication review.  Diabetes mellitus management - Manages diabetes with weekly Ozempic  injections and daily metformin  - Last hemoglobin A1c was within target range; repeat A1c to be checked today  Numbness of hands - Intermittent numbness and tingling in the hands, primarily when sitting or using his phone - Symptoms resolve upon moving the hand - Uncertain if symptoms occur in both hands simultaneously - Unaware if symptoms occur at work - No associated neck pain  Depression - Takes citalopram  40 mg daily for anxiety and depression.  Over the age of 63, there is a recommendation for dose decrease. - No current side effects from citalopram    OBJECTIVE:   BP 105/66   Pulse 67   Wt 174 lb (78.9 kg)   SpO2 98%   BMI 27.25 kg/m    General: NAD, pleasant Cardio: RRR, no MRG. Respiratory: CTAB, normal wob on RA Bilateral shoulder/arm/hand: No ecchymosis, features of CMC arthritis bilaterally, no swelling.  No TTP.  Crepitus in shoulder joint felt bilaterally.  Full passive/active range of motion.  5/5 strength, normal sensation. Spurling negative Hawkins negative Empty can negative Tinel's negative  ASSESSMENT/PLAN:   Assessment & Plan Type 2 diabetes mellitus without complication, without long-term current use of insulin (HCC) A1c well-controlled today, 6.0.  Recommend continuing metformin  for additional cardiovascular benefit. - Continue Ozempic  and metformin  Dyslipidemia -Check lipid panel today - Continue rosuvastatin /Zetia  Screening for colon cancer -Cologuard today Depression, unspecified depression type Using shared decision-making, patient will not decrease dose at this time. - Continue  citalopram  40 mg daily, if side effects arise consider decreasing to 20 mg daily. Hand numbness Benign exam, suspect nerve impingement over diabetic neuropathy.  Discussed EMG, patient declines this time prefers watchful waiting.    Gladis Church, DO [facility]

## 2024-09-10 NOTE — Assessment & Plan Note (Signed)
 A1c well-controlled today, 6.0.  Recommend continuing metformin  for additional cardiovascular benefit. - Continue Ozempic  and metformin 

## 2024-09-10 NOTE — Patient Instructions (Addendum)
 It was great to see you! Thank you for allowing me to participate in your care!   I recommend that you always bring your medications to each appointment as this makes it easy to ensure we are on the correct medications and helps us  not miss when refills are needed.  Our plans for today:  - Please make sure to get your annual eye exam - I have placed order for cologuard - Please return to care if your hand numbness does not improve or worsen - We are checking some labs today, I will call you if they are abnormal will send you a MyChart message or a letter if they are normal.  If you do not hear about your labs in the next 2 weeks please let us  know. - Follow-up in 3 months for your annual exam  Take care and seek immediate care sooner if you develop any concerns. Please remember to show up 15 minutes before your scheduled appointment time!  Gladis Church, DO [facility]

## 2024-09-11 LAB — LIPID PANEL
Chol/HDL Ratio: 2.6 ratio (ref 0.0–5.0)
Cholesterol, Total: 116 mg/dL (ref 100–199)
HDL: 44 mg/dL (ref >39)
LDL Chol Calc (NIH): 48 mg/dL (ref 0–99)
Triglycerides: 137 mg/dL (ref 0–149)
VLDL Cholesterol Cal: 24 mg/dL (ref 5–40)

## 2024-09-14 ENCOUNTER — Other Ambulatory Visit: Payer: Self-pay

## 2024-09-14 DIAGNOSIS — E119 Type 2 diabetes mellitus without complications: Secondary | ICD-10-CM

## 2024-09-14 MED ORDER — OZEMPIC (1 MG/DOSE) 4 MG/3ML ~~LOC~~ SOPN: 1.0000 mg | PEN_INJECTOR | SUBCUTANEOUS | 2 refills | Status: DC

## 2024-09-23 ENCOUNTER — Other Ambulatory Visit: Payer: Self-pay

## 2024-09-23 LAB — COLOGUARD: COLOGUARD: NEGATIVE

## 2024-09-23 MED ORDER — METFORMIN HCL ER 500 MG PO TB24: 500.0000 mg | ORAL_TABLET | Freq: Every day | ORAL | 1 refills | Status: AC

## 2024-11-02 ENCOUNTER — Other Ambulatory Visit: Payer: Self-pay

## 2024-11-02 DIAGNOSIS — E119 Type 2 diabetes mellitus without complications: Secondary | ICD-10-CM

## 2024-11-02 MED ORDER — OZEMPIC (1 MG/DOSE) 4 MG/3ML ~~LOC~~ SOPN: 1.0000 mg | PEN_INJECTOR | SUBCUTANEOUS | 2 refills | Status: AC

## 2024-11-26 ENCOUNTER — Other Ambulatory Visit: Payer: Self-pay | Admitting: Student
